# Patient Record
Sex: Male | Born: 1955 | Race: Black or African American | Hispanic: No | Marital: Single | Smoking: Never smoker
Health system: Southern US, Community
[De-identification: ages and names within clinical notes are randomized; demographics above are authoritative.]

## PROBLEM LIST (undated history)

## (undated) DIAGNOSIS — E785 Hyperlipidemia, unspecified: Secondary | ICD-10-CM

## (undated) DIAGNOSIS — E119 Type 2 diabetes mellitus without complications: Secondary | ICD-10-CM

## (undated) DIAGNOSIS — E7220 Disorder of urea cycle metabolism, unspecified: Secondary | ICD-10-CM

## (undated) DIAGNOSIS — D573 Sickle-cell trait: Secondary | ICD-10-CM

## (undated) DIAGNOSIS — G459 Transient cerebral ischemic attack, unspecified: Secondary | ICD-10-CM

## (undated) DIAGNOSIS — I10 Essential (primary) hypertension: Secondary | ICD-10-CM

## (undated) DIAGNOSIS — R569 Unspecified convulsions: Secondary | ICD-10-CM

## (undated) DIAGNOSIS — D709 Neutropenia, unspecified: Secondary | ICD-10-CM

## (undated) DIAGNOSIS — F209 Schizophrenia, unspecified: Secondary | ICD-10-CM

## (undated) DIAGNOSIS — E079 Disorder of thyroid, unspecified: Secondary | ICD-10-CM

## (undated) DIAGNOSIS — T56891A Toxic effect of other metals, accidental (unintentional), initial encounter: Secondary | ICD-10-CM

---

## 2007-03-31 ENCOUNTER — Emergency Department: Payer: Self-pay | Admitting: Emergency Medicine

## 2007-07-31 ENCOUNTER — Emergency Department: Payer: Self-pay | Admitting: Emergency Medicine

## 2007-07-31 ENCOUNTER — Other Ambulatory Visit: Payer: Self-pay

## 2008-02-05 ENCOUNTER — Emergency Department: Payer: Self-pay | Admitting: Emergency Medicine

## 2008-02-22 ENCOUNTER — Emergency Department: Payer: Self-pay | Admitting: Emergency Medicine

## 2008-04-15 ENCOUNTER — Inpatient Hospital Stay: Payer: Self-pay | Admitting: Internal Medicine

## 2008-04-16 ENCOUNTER — Other Ambulatory Visit: Payer: Self-pay

## 2008-05-20 ENCOUNTER — Inpatient Hospital Stay: Payer: Self-pay | Admitting: Internal Medicine

## 2008-05-29 ENCOUNTER — Emergency Department: Payer: Self-pay | Admitting: Emergency Medicine

## 2008-07-13 ENCOUNTER — Emergency Department: Payer: Self-pay | Admitting: Emergency Medicine

## 2008-09-09 ENCOUNTER — Inpatient Hospital Stay: Payer: Self-pay | Admitting: Psychiatry

## 2009-10-12 ENCOUNTER — Emergency Department: Payer: Self-pay | Admitting: Emergency Medicine

## 2009-11-27 ENCOUNTER — Emergency Department: Payer: Self-pay | Admitting: Internal Medicine

## 2009-12-30 ENCOUNTER — Emergency Department: Payer: Self-pay | Admitting: Emergency Medicine

## 2010-03-09 ENCOUNTER — Emergency Department: Payer: Self-pay | Admitting: Unknown Physician Specialty

## 2010-08-23 ENCOUNTER — Emergency Department: Payer: Self-pay | Admitting: Emergency Medicine

## 2011-06-10 ENCOUNTER — Emergency Department: Payer: Self-pay | Admitting: Emergency Medicine

## 2012-02-29 ENCOUNTER — Emergency Department: Payer: Self-pay | Admitting: Emergency Medicine

## 2012-02-29 LAB — COMPREHENSIVE METABOLIC PANEL
Anion Gap: 6 — ABNORMAL LOW (ref 7–16)
Calcium, Total: 8.3 mg/dL — ABNORMAL LOW (ref 8.5–10.1)
Chloride: 108 mmol/L — ABNORMAL HIGH (ref 98–107)
Co2: 28 mmol/L (ref 21–32)
EGFR (African American): >60
EGFR (Non-African Amer.): >60
Glucose: 112 mg/dL — ABNORMAL HIGH (ref 65–99)
Potassium: 3.8 mmol/L (ref 3.5–5.1)
SGOT(AST): 19 U/L (ref 15–37)
SGPT (ALT): 15 U/L (ref 12–78)
Sodium: 142 mmol/L (ref 136–145)
Total Protein: 7.2 g/dL (ref 6.4–8.2)

## 2012-02-29 LAB — URINALYSIS, COMPLETE
Bacteria: NONE SEEN
Bilirubin,UR: NEGATIVE
Glucose,UR: NEGATIVE mg/dL (ref 0–75)
Ketone: NEGATIVE
Leukocyte Esterase: NEGATIVE
Ph: 7 (ref 4.5–8.0)
RBC,UR: <1 /HPF (ref 0–5)
Specific Gravity: 1.003 (ref 1.003–1.030)
Squamous Epithelial: NONE SEEN
WBC UR: <1 /HPF (ref 0–5)

## 2012-02-29 LAB — SALICYLATE LEVEL: Salicylates, Serum: <1.7 mg/dL

## 2012-02-29 LAB — DRUG SCREEN, URINE
Barbiturates, Ur Screen: NEGATIVE (ref ?–200)
Cannabinoid 50 Ng, Ur ~~LOC~~: NEGATIVE (ref ?–50)
Cocaine Metabolite,Ur ~~LOC~~: NEGATIVE (ref ?–300)
Opiate, Ur Screen: NEGATIVE (ref ?–300)
Tricyclic, Ur Screen: NEGATIVE (ref ?–1000)

## 2012-02-29 LAB — CBC
HCT: 38.9 % — ABNORMAL LOW (ref 40.0–52.0)
HGB: 12.7 g/dL — ABNORMAL LOW (ref 13.0–18.0)
MCV: 81 fL (ref 80–100)
RBC: 4.82 10*6/uL (ref 4.40–5.90)
RDW: 14.1 % (ref 11.5–14.5)
WBC: 2.9 10*3/uL — ABNORMAL LOW (ref 3.8–10.6)

## 2012-02-29 LAB — ETHANOL: Ethanol %: <0.003 % (ref 0.000–0.080)

## 2012-09-11 ENCOUNTER — Emergency Department: Payer: Self-pay | Admitting: Emergency Medicine

## 2012-09-11 LAB — CBC
HCT: 40.5 % (ref 40.0–52.0)
HGB: 13.2 g/dL (ref 13.0–18.0)
MCH: 26.6 pg (ref 26.0–34.0)
MCHC: 32.6 g/dL (ref 32.0–36.0)
RBC: 4.97 10*6/uL (ref 4.40–5.90)

## 2012-09-11 LAB — COMPREHENSIVE METABOLIC PANEL
Albumin: 4.1 g/dL (ref 3.4–5.0)
Alkaline Phosphatase: 68 U/L (ref 50–136)
Anion Gap: 11 (ref 7–16)
BUN: 7 mg/dL (ref 7–18)
Calcium, Total: 8.6 mg/dL (ref 8.5–10.1)
Creatinine: 0.99 mg/dL (ref 0.60–1.30)
SGOT(AST): 21 U/L (ref 15–37)
SGPT (ALT): 15 U/L (ref 12–78)
Total Protein: 7.6 g/dL (ref 6.4–8.2)

## 2012-09-11 LAB — DRUG SCREEN, URINE
Barbiturates, Ur Screen: NEGATIVE (ref ?–200)
Benzodiazepine, Ur Scrn: NEGATIVE (ref ?–200)
Cannabinoid 50 Ng, Ur ~~LOC~~: NEGATIVE (ref ?–50)
Cocaine Metabolite,Ur ~~LOC~~: NEGATIVE (ref ?–300)
MDMA (Ecstasy)Ur Screen: NEGATIVE (ref ?–500)
Opiate, Ur Screen: NEGATIVE (ref ?–300)
Phencyclidine (PCP) Ur S: NEGATIVE (ref ?–25)

## 2012-09-11 LAB — URINALYSIS, COMPLETE
Bacteria: NONE SEEN
Blood: NEGATIVE
Leukocyte Esterase: NEGATIVE
Nitrite: NEGATIVE
Ph: 7 (ref 4.5–8.0)
RBC,UR: <1 /HPF (ref 0–5)
Squamous Epithelial: <1
WBC UR: <1 /HPF (ref 0–5)

## 2012-09-11 LAB — TSH: Thyroid Stimulating Horm: 1.82 u[IU]/mL

## 2012-09-11 LAB — ETHANOL
Ethanol %: <0.003 % (ref 0.000–0.080)
Ethanol: <3 mg/dL

## 2013-07-22 ENCOUNTER — Emergency Department: Payer: Self-pay | Admitting: Emergency Medicine

## 2013-07-22 LAB — COMPREHENSIVE METABOLIC PANEL
Albumin: 4.2 g/dL (ref 3.4–5.0)
Alkaline Phosphatase: 55 U/L
Anion Gap: 8 (ref 7–16)
BUN: 9 mg/dL (ref 7–18)
Bilirubin,Total: 0.6 mg/dL (ref 0.2–1.0)
CO2: 28 mmol/L (ref 21–32)
CREATININE: 1 mg/dL (ref 0.60–1.30)
Calcium, Total: 8.7 mg/dL (ref 8.5–10.1)
Chloride: 103 mmol/L (ref 98–107)
EGFR (Non-African Amer.): >60
Glucose: 123 mg/dL — ABNORMAL HIGH (ref 65–99)
Osmolality: 278 (ref 275–301)
Potassium: 3.1 mmol/L — ABNORMAL LOW (ref 3.5–5.1)
SGOT(AST): 18 U/L (ref 15–37)
SGPT (ALT): 14 U/L (ref 12–78)
Sodium: 139 mmol/L (ref 136–145)
Total Protein: 7.6 g/dL (ref 6.4–8.2)

## 2013-07-22 LAB — DRUG SCREEN, URINE

## 2013-07-22 LAB — CBC
HCT: 41.1 % (ref 40.0–52.0)
HGB: 13.5 g/dL (ref 13.0–18.0)
MCH: 26.3 pg (ref 26.0–34.0)
MCHC: 32.8 g/dL (ref 32.0–36.0)
MCV: 80 fL (ref 80–100)
PLATELETS: 212 10*3/uL (ref 150–440)
RBC: 5.13 10*6/uL (ref 4.40–5.90)
RDW: 13.5 % (ref 11.5–14.5)
WBC: 3.2 10*3/uL — ABNORMAL LOW (ref 3.8–10.6)

## 2013-07-22 LAB — ETHANOL
Ethanol %: <0.003 % (ref 0.000–0.080)
Ethanol: <3 mg/dL

## 2013-07-22 LAB — SALICYLATE LEVEL: Salicylates, Serum: <1.7 mg/dL

## 2013-07-22 LAB — ACETAMINOPHEN LEVEL: Acetaminophen: <2 ug/mL

## 2013-07-22 LAB — TSH: Thyroid Stimulating Horm: 7.61 u[IU]/mL — ABNORMAL HIGH

## 2014-03-22 ENCOUNTER — Emergency Department: Payer: Self-pay | Admitting: Student

## 2014-03-22 LAB — ETHANOL: Ethanol: <3 mg/dL

## 2014-03-22 LAB — COMPREHENSIVE METABOLIC PANEL
ALBUMIN: 4.1 g/dL (ref 3.4–5.0)
ALT: 13 U/L — AB
ANION GAP: 10 (ref 7–16)
Alkaline Phosphatase: 61 U/L
BUN: 15 mg/dL (ref 7–18)
Bilirubin,Total: 0.7 mg/dL (ref 0.2–1.0)
CALCIUM: 8.4 mg/dL — AB (ref 8.5–10.1)
CHLORIDE: 105 mmol/L (ref 98–107)
CO2: 26 mmol/L (ref 21–32)
Creatinine: 1.11 mg/dL (ref 0.60–1.30)
EGFR (African American): >60
Glucose: 133 mg/dL — ABNORMAL HIGH (ref 65–99)
Osmolality: 284 (ref 275–301)
Potassium: 3.5 mmol/L (ref 3.5–5.1)
SGOT(AST): 24 U/L (ref 15–37)
Sodium: 141 mmol/L (ref 136–145)
TOTAL PROTEIN: 8.1 g/dL (ref 6.4–8.2)

## 2014-03-22 LAB — CBC
HCT: 42.5 % (ref 40.0–52.0)
HGB: 13.4 g/dL (ref 13.0–18.0)
MCH: 26.2 pg (ref 26.0–34.0)
MCHC: 31.6 g/dL — ABNORMAL LOW (ref 32.0–36.0)
MCV: 83 fL (ref 80–100)
PLATELETS: 247 10*3/uL (ref 150–440)
RBC: 5.12 10*6/uL (ref 4.40–5.90)
RDW: 13.8 % (ref 11.5–14.5)
WBC: 4.1 10*3/uL (ref 3.8–10.6)

## 2014-03-22 LAB — SALICYLATE LEVEL: Salicylates, Serum: <1.7 mg/dL

## 2014-03-22 LAB — TSH: Thyroid Stimulating Horm: 3.37 u[IU]/mL

## 2014-03-22 LAB — ACETAMINOPHEN LEVEL

## 2014-03-23 LAB — DRUG SCREEN, URINE

## 2014-04-22 ENCOUNTER — Emergency Department: Payer: Self-pay | Admitting: Emergency Medicine

## 2014-04-22 LAB — COMPREHENSIVE METABOLIC PANEL
ALBUMIN: 3.6 g/dL (ref 3.4–5.0)
ALK PHOS: 61 U/L
ANION GAP: 2 — AB (ref 7–16)
AST: 14 U/L — AB (ref 15–37)
BILIRUBIN TOTAL: 0.4 mg/dL (ref 0.2–1.0)
BUN: 9 mg/dL (ref 7–18)
Calcium, Total: 8.1 mg/dL — ABNORMAL LOW (ref 8.5–10.1)
Chloride: 108 mmol/L — ABNORMAL HIGH (ref 98–107)
Co2: 31 mmol/L (ref 21–32)
Creatinine: 1.22 mg/dL (ref 0.60–1.30)
Glucose: 123 mg/dL — ABNORMAL HIGH (ref 65–99)
OSMOLALITY: 281 (ref 275–301)
Potassium: 3.3 mmol/L — ABNORMAL LOW (ref 3.5–5.1)
SGPT (ALT): 16 U/L
SODIUM: 141 mmol/L (ref 136–145)
Total Protein: 7.3 g/dL (ref 6.4–8.2)

## 2014-04-22 LAB — DRUG SCREEN, URINE

## 2014-04-22 LAB — CBC
HCT: 42 % (ref 40.0–52.0)
HGB: 13.5 g/dL (ref 13.0–18.0)
MCH: 26.2 pg (ref 26.0–34.0)
MCHC: 32.1 g/dL (ref 32.0–36.0)
MCV: 82 fL (ref 80–100)
Platelet: 246 10*3/uL (ref 150–440)
RBC: 5.14 10*6/uL (ref 4.40–5.90)
RDW: 13.5 % (ref 11.5–14.5)
WBC: 4.4 10*3/uL (ref 3.8–10.6)

## 2014-04-22 LAB — SALICYLATE LEVEL: Salicylates, Serum: <1.7 mg/dL

## 2014-04-22 LAB — ETHANOL

## 2014-04-22 LAB — TSH: Thyroid Stimulating Horm: 4.29 u[IU]/mL

## 2014-04-22 LAB — ACETAMINOPHEN LEVEL

## 2014-10-25 NOTE — Consult Note (Signed)
Brief Consult Note: Diagnosis: Paranoid schizophrenia.   Patient was seen by consultant.   Consult note dictated.   Recommend further assessment or treatment.   Orders entered.   Discussed with Attending MD.   Comments: [name] returns to the ER floridly psychotic. He has been refusing medications for the past month. He is calm and cooperative. He accepted medications with encouragment today. No longer he studies the Bible or writes notes. He is preoccupied with going back to school and believes that his ACT team took steps to register him for the fall semester. This is also a returning theme. There are no behavioral problems but treatment compliance is still a problem.    MSE: Alert, oriented to person and place. Friendly but terribly disorganized. Unable to hold a sensible conversation but able to communicate. No safety issues.   PLAN: 1.The patient has a history of poor behavior and poor treatment compliance when admitted here. He does well in the ER and on the medical floor.   2. I restarted all his medications as prescribed by Dr. Cherylann [name] in the community.  3. He usually get well before [facility] bed opens up for him. No referreal made.  4. He may return to his group home and ACTT team.   5. I will follow up.  Electronic Signatures: [name], [name] (MD)  (Signed 27-Aug-13 22:20)  Authored: Brief Consult Note   Last Updated: 27-Aug-13 22:20 by [name], [name] (MD)

## 2014-10-25 NOTE — Consult Note (Signed)
Brief Consult Note: Diagnosis: Paranoid schizophrenia.   Patient was seen by consultant.   Consult note dictated.   Recommend further assessment or treatment.   Orders entered.   Discussed with Attending MD.   Comments: [name] returns to the ER floridly psychotic. He has been refusing medications for the past month. He is calm and cooperative. He accepts medications better now.    He is disorganized, hyperreligious, intrusive, incomprehensible.   MSE: Alert, oriented to person and place. He inquires "why am I here" we tell him to take medications and shower. He has not shower since admission due to religious paranoid delusions. Her is still irritable.   PLAN: 1. The patient has a history of poor behavior and poor treatment compliance when admitted here. He does well in the ER and on the medical floor.   2. I restarted all his medications as prescribed by Dr. Cherylann RatelLateef in the community.  3. He usually get well before [facility] bed opens up for him but a referral was made.   4. He may return to his group home and ACTT team.   5. I will follow up.  Electronic Signatures: Kristine LineaPucilowska, Adrieana Fennelly (MD)  (Signed 03-Sep-13 19:57)  Authored: Brief Consult Note   Last Updated: 03-Sep-13 19:57 by Kristine LineaPucilowska, Alexa Golebiewski (MD)

## 2014-10-25 NOTE — Consult Note (Signed)
Brief Consult Note: Diagnosis: Paranoid schizophrenia.   Patient was seen by consultant.   Consult note dictated.   Recommend further assessment or treatment.   Orders entered.   Discussed with Attending MD.   Comments: [name] returns to the ER floridly psychotic. He has been refusing medications for the past month. He is calm and cooperative. He accepted medications with encouragment today. He is terribly disorganized and hyperreligious. He is mumbling, intrusive, incomprehensible.   MSE: Alert, oriented to person and place. Friendly but terribly disorganized. Unable to hold a sensible conversation but able to communicate. No safety issues.   PLAN: 1.The patient has a history of poor behavior and poor treatment compliance when admitted here. He does well in the ER and on the medical floor.   2. I restarted all his medications as prescribed by Dr. Cherylann RatelLateef in the community.  3. He usually get well before [facility] bed opens up for him but a referral was made.   4. He may return to his group home and ACTT team.   5. I will follow up.  Electronic Signatures: Kristine LineaPucilowska, Kolina Kube (MD)  (Signed 31-Aug-13 22:42)  Authored: Brief Consult Note   Last Updated: 31-Aug-13 22:42 by Kristine LineaPucilowska, Larance Ratledge (MD)

## 2014-10-25 NOTE — Consult Note (Signed)
Brief Consult Note: Diagnosis: Paranoid schizophrenia.   Patient was seen by consultant.   Consult note dictated.   Recommend further assessment or treatment.   Orders entered.   Discussed with Attending MD.   Comments: [name] returns to the ER floridly psychotic. He has been refusing medications for the past month at the group home. He is marginally cooperative in the ER. He did shower yesterday. He is still disorganized, hyperreligious, intrusive. He still refuses medications and labs.   MSE: Alert, oriented to person and place. He continues to study the Bible and write copious notes. His speech is easier to understand. he is asking permission [facility].   PLAN: 1. The patient has a history of poor behavior and poor treatment compliance when admitted here. He does well in the ER and on the medical floor. [facility].  2. I restarted all his medications as prescribed by Dr. Cherylann [name] in the community: Tegretol and Zyprexa. I change Zyprexa to Zyprexa Zydis. Will check Tegretol level and LFTs in am. We will check CBC as he presented with leukopenia. he gets Temazepam at night.  3. Medical. We continue ASA, Synthroid, Metformin and Zocor as prescribed by his PCP.   4. He may return to his group home and ACT team.   5. I will follow up.  Electronic Signatures: [name], [name] (MD)  (Signed 10-Sep-13 22:03)  Authored: Brief Consult Note   Last Updated: 10-Sep-13 22:03 by [name], [name] (MD)

## 2014-10-25 NOTE — Consult Note (Signed)
PATIENT NAMEKIYOTO, [name] [id number] OF BIRTH:  1955/07/30 OF CONSULTATION:  03/01/2012 PHYSICIAN:  Dr. Glennie Isle PHYSICIAN:  Dimarco Minkin B. Benedict Kue, MD FOR CONSULTATION: To evaluate a psychotic patient.  DATA: [name] is a 59 year old patient with a history of psychosis.  COMPLAINT: The patient unable to state.  OF PRESENT ILLNESS: [name] resides at assisted living facility. He is a patient of Dr. Cherylann Ratel and [facility] ACT team. He has been refusing all of his medications for the last month, became floridly psychotic and was brought to the ER by the police. The patient has a history of treatment noncompliance and has had similar episodes twice a year. He was here in a similar situation in December of 2012. In addition to psychotropic medications, he also refuses all other meds including diabetes. This is accompanied by decreased appetite and weight loss. Often his facility allows the situation to go on for several months leading to catatonia and mutism. The patient is not able to provide much information on admission. He is hyper religious, writing copious notes and, at times, ignoring the staff. He denies auditory hallucination but is too disorganized to participate in the interview in a meaningful way.  PSYCHIATRIC HISTORY: There is a long history of paranoid schizophrenia with multiple hospitalizations. He has been hospitalized at [facility] as well as [facility]. He usually does well on a combination of Zyprexa and mood stabilizer but there were problems with developing hyperammonemia on Depakote and lithium toxicity. The patient is a diabetic and when gets psychotic does not take his medications, moreover, he does not eat properly. He is originally from [place]. Has been diagnosed in his 42s; has been tried on numerous medications. There is no history of substance abuse.  PSYCHIATRIC HISTORY: None reported.  MEDICAL HISTORY:  1. Hypertension.   2. Diabetes. Hypothyroidism.  Dyslipidemia.  History of positive PPD. Sickle cell trait. History of acute renal failure on lithium. History of high ammonia on Depakote. Questionable seizure disorder.   ALLERGIES: Depakote.  ON ADMISSION:  1. Zyprexa 20 mg twice daily. 2. Aspirin 81 mg daily.  Cogentin 0.5 mg twice daily.  Klonopin 1 mg twice daily.  Synthroid 0.075 mg daily.  Metformin 1000 mg twice daily. Zocor 20 mg at bedtime. Trazodone 100 mg at bedtime.  HISTORY: He used to live with his mother in [place]. He visited some relatives in our area a couple of years ago and decided to stay here. His mother relocated briefly to our area but as far as we know she is back in [place]. She is very supportive and stays in close contact with the patient. He is a resident of assisted living facility. He is disabled. He has been working with [facility] ACT team.  OF SYSTEMS: Difficult to obtain. The patient is not cooperating.  EXAMINATION: VITAL SIGNS: Blood pressure 163/91, pulse 76, respirations 18, temperature 98.7. GENERAL: [name] is an African American male asleep in his room. The rest of the physical examination is deferred to his primary attending.  DIAGNOSTIC AND RADIOLOGICAL DATA: Chemistries are within normal limits except blood glucose 112. LFTs within normal limits, BAL zero, UDS negative for substances. CBC within normal limits except WBC 2.9. Urinalysis is not suggestive of UTI. Serum acetaminophen and salicylates are low.   STATUS EXAMINATION: The patient is asleep but arousable. He is pleasant polite and cooperative but terribly disorganized. He is oriented to person and place only. He knows he was brought to the  hospital by police. He denies suicidal or homicidal ideation. He is preocculied with religion. He knows it is Sunday and he should be in church. He denies hallucinations but appears to attend to internal stimuli. His insight and judgment are very poor.  RISK ASSESSMENT: This is a patient  with a history of psychosis and mood instability, treatment noncompliance, but no suicide attempt in his past.  I:  Chronic paranoid schizophrenia. II:  Deferred. III:  Hypertension,  Dyslipidemia, Hypothyroidism, Diabetes, Neutropenia, History of positive PPD. IV:  Mental illness, treatment compliance, primary support. V:  GAF 25.   We will restart [name] on all his medications as prescribed by Dr. Cherylann RatelLateef in the community as well as his primary doctor, Dr. Lacie ScottsNiemeyer.   At times the patient had to be transferred to [facility] for [facility] treatment but on numerous occasions we were able to discharge him from the Emergency Room.   He is not a good candidate [facility] as he tends to be treatment noncompliant while in a psychiatric hospital. He does very well in the Emergency Room or on the medical floor.   He is allowed to return to his facility.  5.  I will follow up.  Electronic Signatures: Kristine LineaPucilowska, Ryleigh Esqueda (MD)  (Signed on 27-Aug-13 01:42)  Authored  Last Updated: 27-Aug-13 01:42 by Kristine LineaPucilowska, Hazle Ogburn (MD)

## 2014-10-25 NOTE — Consult Note (Signed)
Brief Consult Note: Diagnosis: Paranoid schizophrenia.   Patient was seen by consultant.   Consult note dictated.   Recommend further assessment or treatment.   Orders entered.   Discussed with Attending MD.   Comments: [name] returns to the ER floridly psychotic. He has been refusing medications for the past month at the group home. He is marginally cooperative in the ER. He accepts medications more readily but still with encouragement. He is disorganized, hyperreligious, intrusive, incomprehensible.   MSE: Alert, oriented to person and place. He continues to study the Bible and write copious notes. He tied his "cape" in a differen fashion today, more like a roman toga rather than Superman style. He still has not shower claiming that the Bible forbids him to do so. Her is still irritable with me but no more name calling.   PLAN: 1. The patient has a history of poor behavior and poor treatment compliance when admitted here. He does well in the ER and on the medical floor.   2. I restarted all his medications as prescribed by Dr. Cherylann RatelLateef in the community.  3. He usually get well before [facility] bed opens up for him but a referral was made.   4. He may return to his group home and ACTT team.   5. I will follow up.  Electronic Signatures: Kristine LineaPucilowska, Chestina Komatsu (MD)  (Signed 04-Sep-13 22:16)  Authored: Brief Consult Note   Last Updated: 04-Sep-13 22:16 by Kristine LineaPucilowska, Raaga Maeder (MD)

## 2014-10-25 NOTE — Consult Note (Signed)
Brief Consult Note: Diagnosis: Paranoid schizophrenia.   Patient was seen by consultant.   Consult note dictated.   Recommend further assessment or treatment.   Orders entered.   Discussed with Attending MD.   Comments: [name] returns to the ER floridly psychotic. He has been refusing medications for the past month at the group home. He is marginally cooperative in the ER. He now acceptes medication but refuses to shower, change clothes or alow labs. He is somewhat more organized and verbal. No more catatonic repetitions.    MSE: Alert, oriented to person and place. He is again preoccupied with religion. H eis less intrusive and communicates better. There are no behavioral problems. No safety issues.  PLAN: 1. The patient has a history of poor behavior and poor treatment compliance when admitted here. He does well in the ER and on the medical floor. [facility].  2. I restarted all his medications as prescribed by Dr. Cherylann RatelLateef in the community: Tegretol and Zyprexa. I change Zyprexa to Zyprexa Zydis. We tried to check Tegretol level and LFTs but the patient refuses labs. Haldol was added along with Zyprexa as his recovery is excruciatingly slow.   3. Medical. We continue ASA, Synthroid, Metformin and Zocor as prescribed by his PCP.   4. He may return to his group home and ACT team.   5. I will follow up.  Electronic Signatures: Kristine LineaPucilowska, Meha Vidrine (MD)  (Signed 19-Sep-13 22:04)  Authored: Brief Consult Note   Last Updated: 19-Sep-13 22:04 by Kristine LineaPucilowska, Zylen Wenig (MD)

## 2014-10-25 NOTE — Consult Note (Signed)
Brief Consult Note: Diagnosis: Paranoid schizophrenia.   Patient was seen by consultant.   Consult note dictated.   Recommend further assessment or treatment.   Orders entered.   Discussed with Attending MD.   Comments: [name] returns to the ER floridly psychotic. He has been refusing medications for the past month at the group home. He is marginally cooperative in the ER. He accepts medications more readily but still with encouragement. He is disorganized, hyperreligious, intrusive, incomprehensible.   MSE: Alert, oriented to person and place. He continues to study the Bible and write copious notes. He tied his "cape" in a differen fashion today, more like a roman toga rather than Superman style. He still has not showered. He has been trying to call his mother in [place] on the phone that does not allow long distance coonection repeatedly in spite of our explaonations. He is incomprehensible. I worry that he has not been taking medications. He is still irritable with me but no more name calling.   PLAN: 1. The patient has a history of poor behavior and poor treatment compliance when admitted here. He does well in the ER and on the medical floor. [facility]  2. I restarted all his medications as prescribed by Dr. Cherylann RatelLateef in the community: Tegretol and Zyprexa. I change Zyprexa to Zyprexa Zydis. Will check Tegretol level and LFTs in am. We will check CBC as he presented with leukopenia. he gets Temazepam at night.  3. Medical. We continue ASA, Synthroid, Metformin and Zocor as prescribed by his PCP.   4. He may return to his group home and ACT team.   5. I will follow up.  Electronic Signatures: Kristine LineaPucilowska, Rebeka Kimble (MD)  (Signed 05-Sep-13 19:11)  Authored: Brief Consult Note   Last Updated: 05-Sep-13 19:11 by Kristine LineaPucilowska, Loany Neuroth (MD)

## 2014-10-25 NOTE — Consult Note (Signed)
Brief Consult Note: Diagnosis: Paranoid schizophrenia.   Patient was seen by consultant.   Consult note dictated.   Recommend further assessment or treatment.   Orders entered.   Discussed with Attending MD.   Comments: [name] returns to the ER floridly psychotic. He has been refusing medications for the past month at the group home. He is marginally cooperative in the ER. He did shower yesterday. He is still disorganized, hyperreligious, intrusive. He still refuses medications and labs.   MSE: Alert, oriented to person and place. He continues to study the Bible and write copious notes. His speech is easier to understand. he is asking permission to [facility].   PLAN: 1. The patient has a history of poor behavior and poor treatment compliance when admitted here. He does well in the ER and on the medical floor. [facility].  2. I restarted all his medications as prescribed by Dr. Cherylann RatelLateef in the community: Tegretol and Zyprexa. I change Zyprexa to Zyprexa Zydis. Will check Tegretol level and LFTs in am. We will check CBC as he presented with leukopenia. he gets Temazepam at night.  3. Medical. We continue ASA, Synthroid, Metformin and Zocor as prescribed by his PCP.   4. He may return to his group home and ACT team.   5. I will follow up.  Electronic Signatures: Kristine LineaPucilowska, Maame Dack (MD)  (Signed 11-Sep-13 22:58)  Authored: Brief Consult Note   Last Updated: 11-Sep-13 22:58 by Kristine LineaPucilowska, Anders Hohmann (MD)

## 2014-10-25 NOTE — Consult Note (Signed)
Brief Consult Note: Diagnosis: Paranoid schizophrenia.   Patient was seen by consultant.   Consult note dictated.   Recommend further assessment or treatment.   Orders entered.   Discussed with Attending MD.   Comments: [name] returns to the ER floridly psychotic. He has been refusing medications for the past month. He is calm and cooperative. He accepted medications with encouragment.   MSE: Alert, oriented to person and place. recognizes me from past admissions. Friendly, Talks about police bringing him to the hospital. He is uncertain why. He knows it is Sunday and feels he needs to be in church. He is reading the Bible and started writing his usual "notes"    PLAN: 1.The patient has ahistory of poor behavior and poor treatment compliance when admitted here. He does well in the ER and on the medical floor.   2. I restarted all his medications as prescribed by Dr. Cherylann [name] in the community.  3. He usually get well before [facility] bed opens up for him. No referreal made.  4. He may return to his group home and ACTT team.   5. I will follow up.  Electronic Signatures: [name], [name] (MD)  (Signed 25-Aug-13 19:23)  Authored: Brief Consult Note   Last Updated: 25-Aug-13 19:23 by [name], [name] (MD)

## 2014-10-25 NOTE — Consult Note (Signed)
Brief Consult Note: Diagnosis: Paranoid schizophrenia.   Patient was seen by consultant.   Consult note dictated.   Recommend further assessment or treatment.   Orders entered.   Discussed with Attending MD.   Comments: Mr. Cliffton AstersWhite returns to the ER floridly psychotic. He has been refusing medications for the past month. He is calm and cooperative. He accepted medications with encouragment today.   He is disorganized, hyperreligious and intrusive, incomprehensible.   MSE: Alert, oriented to person and place. Iirritable, unable to hold a sensible conversation. Called me a "b....tch". No safety issues.   PLAN: 1.The patient has a history of poor behavior and poor treatment compliance when admitted here. He does well in the ER and on the medical floor.   2. I restarted all his medications as prescribed by Dr. Cherylann RatelLateef in the community.  3. He usually get well before [facility] bed opens up for him but a referral was made.   4. He may return to his group home and ACTT team.   5. I will follow up.  Electronic Signatures: Kristine LineaPucilowska, Jolanta (MD)  (Signed 03-Sep-13 19:53)  Authored: Brief Consult Note   Last Updated: 03-Sep-13 19:53 by Kristine LineaPucilowska, Jolanta (MD)

## 2014-10-25 NOTE — Consult Note (Signed)
Brief Consult Note: Diagnosis: Paranoid schizophrenia.   Patient was seen by consultant.   Consult note dictated.   Recommend further assessment or treatment.   Orders entered.   Discussed with Attending MD.   Comments: [name] returns to the ER floridly psychotic. He has been refusing medications for the past month. He is calm and cooperative. He accepted medications with encouragment today. He is terribly disorganized and hyperreligious. He is mumbling, intrusive, incomprehensible.   MSE: Alert, oriented to person and place. Friendly but terribly disorganized. Unable to hold a sensible conversation but able to communicate. No safety issues.   PLAN: 1.The patient has a history of poor behavior and poor treatment compliance when admitted here. He does well in the ER and on the medical floor.   2. I restarted all his medications as prescribed by Dr. Cherylann RatelLateef in the community.  3. He usually get well before [facility] bed opens up for him but a referral was made.   4. He may return to his group home and ACTT team.   5. I will follow up.  Electronic Signatures: Kristine LineaPucilowska, Jaliza Seifried (MD)  (Signed 01-Sep-13 20:22)  Authored: Brief Consult Note   Last Updated: 01-Sep-13 20:22 by Kristine LineaPucilowska, Hatley Henegar (MD)

## 2014-10-25 NOTE — Consult Note (Signed)
Brief Consult Note: Diagnosis: Paranoid schizophrenia.   Patient was seen by consultant.   Consult note dictated.   Recommend further assessment or treatment.   Orders entered.   Discussed with Attending MD.   Comments: Mr. Cliffton AstersWhite returns to the ER floridly psychotic. He has been refusing medications for the past month. He is calm and cooperative. He accepted medications with encouragment today. he is preoccupied with the Bible. he produces copious writtings. .   MSE: Alert, oriented to person and place. Friendly but terribly disorganized unable to answer sensibly any quastions. preoccupied with :three books". No behavioral problems. No safety issues.   PLAN: 1.The patient has ahistory of poor behavior and poor treatment compliance when admitted here. He does well in the ER and on the medical floor.   2. I restarted all his medications as prescribed by Dr. Cherylann RatelLateef in the community.  3. He usually get well before [facility] bed opens up for him. No referreal made.  4. He may return to his group home and ACTT team.   5. I will follow up.  Electronic Signatures: Kristine LineaPucilowska, Jamyson Jirak (MD)  (Signed 26-Aug-13 23:58)  Authored: Brief Consult Note   Last Updated: 26-Aug-13 23:58 by Kristine LineaPucilowska, Torres Hardenbrook (MD)

## 2014-10-25 NOTE — Consult Note (Signed)
Brief Consult Note: Diagnosis: Paranoid schizophrenia.   Patient was seen by consultant.   Recommend to proceed with surgery or procedure.   Recommend further assessment or treatment.   Discussed with Attending MD.   Comments: Mr. Cliffton AstersWhite returns to the ER floridly psychotic. He has been refusing medications for the past month at the group home. He is marginally cooperative in the ER. He did shower yesterday. He is more organized, less intrusive. He no longer refuses medications but does not shower.    MSE: Alert, oriented to person and place. He is no longer hyperreligius. His speech is easier to understand. He discusses latest football games.    PLAN: 1. The patient has a history of poor behavior and poor treatment compliance when admitted here. He does well in the ER and on the medical floor. [facility]  2. I restarted all his medications as prescribed by Dr. Cherylann RatelLateef in the community: Tegretol and Zyprexa. I change Zyprexa to Zyprexa Zydis. Will check Tegretol level and LFTs in am. We will check CBC as he presented with leukopenia. he gets Temazepam at night.  3. Medical. We continue ASA, Synthroid, Metformin and Zocor as prescribed by his PCP.   4. He may return to his group home and ACT team.   5. I will follow up.  Electronic Signatures: Kristine LineaPucilowska, Tesa Meadors (MD)  (Signed 13-Sep-13 21:29)  Authored: Brief Consult Note   Last Updated: 13-Sep-13 21:29 by Kristine LineaPucilowska, Lashunta Frieden (MD)

## 2014-10-25 NOTE — Consult Note (Signed)
Brief Consult Note: Diagnosis: Paranoid schizophrenia.   Patient was seen by consultant.   Consult note dictated.   Recommend further assessment or treatment.   Orders entered.   Discussed with Attending MD.   Comments: [name] returns to the ER floridly psychotic. He has been refusing medications for the past month at the group home. He is marginally cooperative in the ER. He did shower yesterday. He is more organized, less intrusive. He no longer refuses medications but does not shower.    MSE: Alert, oriented to person and place. He is again preoccupied with religion. He is too talkative and intrusive at times, then mute and witdrawn. No behavioral problems. He accepts medications most of the time but refuses labwork. This is unfortunate because he should be tried on Clozaril at this point.   PLAN: 1. The patient has a history of poor behavior and poor treatment compliance when admitted here. He does well in the ER and on the medical floor. [facility]  2. I restarted all his medications as prescribed by Dr. Cherylann RatelLateef in the community: Tegretol and Zyprexa. I change Zyprexa to Zyprexa Zydis. We tried to check Tegretol level and LFTs but the patient refuses labs.   3. Medical. We continue ASA, Synthroid, Metformin and Zocor as prescribed by his PCP.   4. He may return to his group home and ACT team.   5. I will follow up.  Electronic Signatures: [name], [name] (MD)  (Signed 17-Sep-13 00:13)  Authored: Brief Consult Note   Last Updated: 17-Sep-13 00:13 by [name], [name] (MD)

## 2014-10-25 NOTE — Consult Note (Signed)
Brief Consult Note: Diagnosis: Paranoid schizophrenia.   Patient was seen by consultant.   Consult note dictated.   Recommend further assessment or treatment.   Orders entered.   Discussed with Attending MD.   Comments: [name] returns to the ER floridly psychotic. He has been refusing medications for the past month. He is calm and cooperative. He accepted medications with encouragment today. He is terribly disorganized and hyperreligious. Wants to go to church on Sunday and tells me about little kids falling asleep at church. Still reads the Bible. Today he is sleepy and spends whole morning in bed. He takes medications with a great deal od emcouragement.    MSE: Alert, oriented to person and place. Friendly but terribly disorganized. Unable to hold a sensible conversation but able to communicate. No safety issues.   PLAN: 1.The patient has a history of poor behavior and poor treatment compliance when admitted here. He does well in the ER and on the medical floor.   2. I restarted all his medications as prescribed by Dr. Cherylann [name] in the community.  3. He usually get well before [facility] bed opens up for him but a referral was made.   4. He may return to his group home and ACTT team.   5. I will follow up.  Electronic Signatures: [name], [name] (MD)  (Signed [id number] [id number]-Aug-13 23:16)  Authored: Brief Consult Note   Last Updated: 30-Aug-13 23:16 by [name], [name] (MD)

## 2014-10-25 NOTE — Consult Note (Signed)
Brief Consult Note: Diagnosis: Paranoid schizophrenia.   Patient was seen by consultant.   Consult note dictated.   Recommend further assessment or treatment.   Orders entered.   Discussed with Attending MD.   Comments: [name] returns to the ER floridly psychotic. He has been refusing medications for the past month at the group home. He is marginally cooperative in the ER. He accepts medications more readily but still with encouragement. He did shower in the morning but put his dirty clothes back on. He has layers and layers of shirts plus a cape. He is still disorganized, hyperreligious, intrusive. He refused labs this am.  MSE: Alert, oriented to person and place. He continues to study the Bible and write copious notes. His speech is easier to understand. he is asking permission to go to [employer] for a religious event. He also complains that "two tablets" that he brought ot the hospital along with his Bible are missing. He is less irritable with me.   PLAN: 1. The patient has a history of poor behavior and poor treatment compliance when admitted here. He does well in the ER and on the medical floor. [facility]  2. I restarted all his medications as prescribed by Dr. Cherylann RatelLateef in the community: Tegretol and Zyprexa. I change Zyprexa to Zyprexa Zydis. Will check Tegretol level and LFTs in am. We will check CBC as he presented with leukopenia. he gets Temazepam at night.  3. Medical. We continue ASA, Synthroid, Metformin and Zocor as prescribed by his PCP.   4. He may return to his group home and ACT team.   5. I will follow up.  Electronic Signatures: Kristine LineaPucilowska, Yvonnia Tango (MD)  (Signed 10-Sep-13 06:50)  Authored: Brief Consult Note   Last Updated: 10-Sep-13 06:50 by Kristine LineaPucilowska, Willetta York (MD)
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
## 2014-10-28 NOTE — Consult Note (Signed)
Brief Consult Note: Diagnosis: Paranoid schizophrenia.   Patient was seen by consultant.   Consult note dictated.   Recommend further assessment or treatment.   Orders entered.   Discussed with Attending MD.   Comments: [name] seen in the ED. He has started improving and able to respond to most of the questions. Reviewed his chart. Pt has long h/o non compliance of medications and he was on Prolixin Dec in the [facility]. He stopped taking the Decanoate and was on oral medications. Pt has been followed by [facility]- ACTT team.  Case dicussed with staff. Will restart him on Prolixin Decaonate 25mg  q2 weeks. Might consider Outpt Committment for compliance.  He will follow up with [facility]- ACTT team.  Plan; Continue Prolixin 5mg  po TID Continue Olanzapine 10mg  po qam and 20mg  po qhs Increase Cogentin 1mg  po bid.  Start Prolixin Dec 25mg  IM q2 weeks.  Will be d/c on out pt comittment.  Electronic Signatures: Rhunette CroftFaheem, Uzma S (MD)  (Signed 13-Mar-14 10:19)  Authored: Brief Consult Note   Last Updated: 13-Mar-14 10:19 by Rhunette CroftFaheem, Uzma S (MD)
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
## 2014-10-29 NOTE — Consult Note (Signed)
Psychiatry: PAtient continues to be psychotic and confused but less irritatble than previously. Patient is delusional and unable to show insight into his illness. Lacks capacity to decide about mental illness. Under the circumstances I think it is reasonable to try to give patient medication in food as without it he has no real chance of improvement. Still refuses a long-acting shot. Given repeated hospital stays and non-complience we still await [facility].  Electronic Signatures: Audery Amellapacs, Nochum Fenter T (MD)  (Signed on 27-Oct-15 23:47)  Authored  Last Updated: 27-Oct-15 23:47 by Audery Amellapacs, Aprel Egelhoff T (MD)
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
## 2014-10-29 NOTE — Consult Note (Signed)
Psychiatry: Follow-up for this gentleman with schizophrenia and a history of repeated noncompliance.  The patient has no new complaints today.  In conversation he is entirely focused on his hyper religious statements.  He has not shown any acute aggressive behavior or dangerous behavior.  Pretty much stays to himself writing long and incoherent religious tracks on pieces of paper with the crayons that are provided.  Confronted with medication he typically refuses to take it although there have been some cases in the last day or so where spontaneously he will suddenly insist on taking it for no clear reason.  He can never give any explanation for his behavior acutely. review of systems he denies hallucinations denies suicidal or homicidal ideation denies complaints about his mood.  Has no physical complaints. mental status this is a disheveled gentleman not terribly well groomed but at least he is bathing enough to keep himself from smelling bad.  Eye contact only intermittent.  Usually will look up from his writing to talk.  Speech is minimal in amount and usually entirely focused on religious statements.  As an example, I spoke to him for several minutes and asking several open-ended questions.  He did not make any response at all.  A few minutes later he came out of his room and clearly said to me "God will take care of it" before returning to his room.  He does not however appear to be hostile or threatening and has not been violent in his behavior.  Will not cooperate with cognitive testing. remained psychotic.  Very poor insight and judgment.  Unable to take care of himself outside the hospital.  Prone to getting into arguments when confronted about his medication compliance.  Most of the time he stays withdrawn but he has a tendency to occasionally lose his temper especially in group home environments at which time he can appear to be threatening.  Affect team has failed to be able to keep him involved in  medication compliance or improve his insight.  The plan at this point is [facility].  Patient is aware of it and makes no comment one way or the other.  He is on the waiting list.  Plan for now is to continue medication as ordered.  Daily 3 observation and counseling. Schizophrenia undifferentiated  Electronic Signatures: Clapacs, Jackquline DenmarkJohn T (MD)  (Signed on 21-Oct-15 12:35)  Authored  Last Updated: 21-Oct-15 12:35 by Audery Amellapacs, John T (MD)

## 2014-10-29 NOTE — Consult Note (Signed)
PATIENT NAME:  [name], [name] MR#:  [id number] DATE OF BIRTH:  Jan 31, 1956  DATE OF CONSULTATION:  03/22/2014  REFERRING PHYSICIAN:   CONSULTING PHYSICIAN:  Audery AmelJohn T. Clapacs, MD  IDENTIFYING INFORMATION AND REASON FOR CONSULTATION: A 59 year old man brought in under involuntary commitment. History of schizophrenia. Consultation for treatment and disposition.   HISTORY OF PRESENT ILLNESS: Information obtained from the chart and to a lesser degree from the patient. The commitment paperwork from the group home states that he has been refusing medicine for the last week and a half. He is sleeping in the closet, acting bizarre, getting hyperreligious. The patient himself is not providing useful history. He will not tell me anything about what brings him into the hospital. Refuses to acknowledge that he is even in a hospital. Refuses to acknowledge that he has a history of mental health problems or has ever been prescribed any medication.   PAST PSYCHIATRIC HISTORY: Long history of schizophrenia. Multiple hospitalizations. History of aggression in the past when psychotic. He is chronically noncompliant. This has been a frequent problem and becomes very psychotic, hyperreligious, and agitated when he is off his medicine. His behavior and noncompliance are such that he has been beyond the ability of our inpatient unit to manage safely. He [facility]. When he takes adequate doses of antipsychotics and mood stabilizers, he improves and becomes more functional and able to live outside the hospital.   PAST MEDICAL HISTORY: The patient has diabetes managed with oral medication, hypothyroidism, high blood pressure.   SOCIAL HISTORY: Apparently, he has been residing in his current group home a couple of weeks. I am not sure if he was at the hospital just before that or had been at another facility. In the past, he had ACT team services. Not known if he has a guardian at this point.    SUBSTANCE ABUSE HISTORY: No past history identified. He does not discuss it.   FAMILY HISTORY: None identified.   CURRENT MEDICATIONS: The list provided is not clear at this time.   MEDICATIONS: Based on what he has taken in the past are most likely: Tegretol 200 mg twice a day, Zyprexa 10 mg twice a day, Cogentin 0.5 mg twice a day, Klonopin 0.5 mg twice a day, aspirin 81 mg a day, levothyroxine 75 mcg a day, metformin 1000 mg twice a day, simvastatin 20 mg per day, trazodone 100 mg at night.   ALLERGIES: DEPAKOTE.   REVIEW OF SYSTEMS: The patient is not answering any direct questions. He argues and claims that he is not in a hospital and that he is actually the doctor. Becomes are rambling after that. Does not appear to be in obvious physical distress.   MENTAL STATUS EXAMINATION: Disheveled, chronically ill gentleman interviewed in the Emergency Room. He is only passively cooperative and at times oppositional. Eye contact intermittent. Psychomotor activity limited. Speech is decreased in total amount, somewhat slurred because of his very poor dentition. Affect constricted. Mood stated as fine. Thoughts are disorganized, bizarre, dominated by delusions. Will not answer direct questions about suicidal or homicidal ideation. Will not answer direct questions about hallucinations. Judgment and insight poor. Cognitive testing cannot be done because of his psychosis.   LABORATORY RESULTS: CBC normal. Salicylates normal. Thyroid-stimulating hormone 3.3 which is normal. Chemistry panel unremarkable. Acetaminophen negative. No urine back yet.   VITAL SIGNS: Most recent blood pressure 189/107, pulse 111, respirations 18.   ASSESSMENT: A 59 year old man with schizophrenia, once again  noncompliant with his medicines, extremely psychotic, unmanageable at the group home. History would suggest he will be unmanageable on our psychiatric unit.   TREATMENT PLAN: Restart all of his medicines. Try and form  some rapport gradually and get some more history. We will initiate referral to [facility] at this point. Monitor vital signs. He may need blood pressure medicine added.   DIAGNOSIS, PRINCIPAL AND PRIMARY:  AXIS I: Schizophrenia.   SECONDARY DIAGNOSES: AXIS I: No further.  AXIS II: No diagnosis.  AXIS III: Hypothyroid, diabetes, hypertension.    ____________________________ Audery Amel, MD jtc:at D: 03/22/2014 17:28:18 ET T: 03/22/2014 17:48:00 ET JOB#: [id number]  cc: Audery Amel, MD, <Dictator> Audery Amel MD ELECTRONICALLY SIGNED 03/31/2014 0:12
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
## 2014-10-29 NOTE — Consult Note (Signed)
PATIENT NAME:  [name], [name] MR#:  [id number] DATE OF BIRTH:  1956-06-03  DATE OF CONSULTATION:  04/22/2014  REFERRING PHYSICIAN:   CONSULTING PHYSICIAN:  Ganon Demasi K. Guss Bundehalla, MD  PLACE OF DICTATION: [facility] Emergency Room #[facility], [place], [place].   AGE: 59 years.   SEX: Male.   RACE: African American.   SUBJECTIVE: The patient was seen in [facility] in [facility]. The patient is a 59 year old African-American male with a long history of schizophrenia, schizoaffective disorder with psychosis. He has been living at a group home and being followed by ACT team through [facility] According to information obtained, the patient has been noncompliant with his medication, not taking his medications as prescribed or recommended. He became very agitated and very combative and was brought to [facility]. Currently, he was given Geodon and Ativan IM, and he is sleeping. The patient shows aggressive behavior towards himself and people around, and behavior tends to be dangerous to self or others as he is very psychotic and bizarre. He is recommended for admission and [facility].   IMPRESSION: Schizoaffective disorder with psychosis.   RECOMMEND: Continue involuntary commitment and await [facility] for further help.     ____________________________ Jannet MantisSurya K. Guss Bundehalla, MD skc:at D: 04/22/2014 15:12:45 ET T: 04/22/2014 15:18:34 ET JOB#: [id number]  cc: Monika SalkSurya K. Guss Bundehalla, MD, <Dictator> Beau FannySURYA K Siniya Lichty MD ELECTRONICALLY SIGNED 04/23/2014 12:04
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
## 2014-10-29 NOTE — Consult Note (Signed)
Psychiatry: Follow-up for this gentleman with schizophrenia.  He has no new complaints.  Continues to stay mostly withdrawn to his room.  When he does interact with staff it is fairly pleasant.  No signs of threatening or hostile behavior.  Patient is only intermittently cooperative with medicine.  He denies suicidal or homicidal ideation.  Denies hallucinations but appears to be responding to internal stimuli.  Disorganized paranoid hyper religious thought. change to diagnosis or treatment plan.  [facility] to [facility].  Electronic Signatures: Kimberlly Norgard, Jackquline DenmarkJohn T (MD)  (Signed on 23-Oct-15 19:35)  Authored  Last Updated: 23-Oct-15 19:35 by Audery Amellapacs, Garnett Rekowski T (MD)

## 2014-10-29 NOTE — Consult Note (Signed)
Psychiatry: Follow-up for this 59 year old man with schizophrenia.  He has been compliant with medication.  He has no real new complaints today.  He is requesting that we discharge him so that [place] can go to [place] to play [place] basketball.  Patient speech continues to be disorganized and hyper religious with some grandiosity.  At least however he is speaking to maintain and holding a appropriate conversation.  Still shows no insight.  Still talks about hearing voices.  Mood and affect appear to be pleasant however.  No aggressive behavior no threatening behavior.  is schizophrenia.  We have [facility] but if his compliance continues to be adequate we can investigate possible return home as well.  Past history continues to make him inappropriate for admission downstairs.  Supportive and educational counseling done.  No changed plan.  Electronic Signatures: Clapacs, Jackquline DenmarkJohn T (MD)  (Signed on 16-Sep-15 17:01)  Authored  Last Updated: 16-Sep-15 17:01 by Audery Amellapacs, John T (MD)

## 2014-10-29 NOTE — Consult Note (Signed)
Psychiatry: Follow-up for this 59 year old man with schizophrenia.  For the most part he has been calm and cooperative.  He continues to be psychotic and hyper religious.  Affect and mood are labile.  No violence or threats however.  With a little bit of coaching he can usually take his medication.  Patient has a long history of medicine noncompliance.  Currently he is improving and is nearing his baseline. current medicine including Zyprexa.  Supportive counseling done.  We will try to recontact his group home and see if he can possibly go back there otherwise [facility] Schizophrenia  Electronic Signatures: Josilynn Losh, Jackquline DenmarkJohn T (MD)  (Signed on 17-Sep-15 18:53)  Authored  Last Updated: 17-Sep-15 18:53 by Audery Amellapacs, Braniya Farrugia T (MD)

## 2014-10-29 NOTE — Consult Note (Signed)
PATIENT NAME:  [name], Gregory MR#:  [id number] DATE OF BIRTH:  Nov 19, 1955  DATE OF CONSULTATION:  04/25/2014  REFERRING PHYSICIAN:   CONSULTING PHYSICIAN:  Gregory AmelJohn T. Clapacs, MD  IDENTIFYING INFORMATION AND REASON FOR CONSULT: This is a [age] year old male with a long history of schizophrenia, who was brought from his group home with reports that he has been aggressive, threatening, refusing medication, acting bizarre, poor self-care.   HISTORY OF PRESENT ILLNESS: Information obtained from the patient and the chart. Commitment paperwork reports that he has been acting bizarre, has been hyperreligious, delusion, disorganized in his thinking and behavior. Refuses medication. Refuses to obey rules around the house. Gets hostile with staff. ACT team has been working with him to try to establish compliance with medication and improved functioning without any success. They are concerned that he continues to worsen in his functioning. On interview today, the patient as usual does not really even acknowledge that he has any mental health problems. He acknowledges that something called the ACT Team exists, but he is vague about what they do for him or what their goals are. He is completely evasive when discussing whether or not he takes medication. He is so hyperreligious and disorganized in his thinking right now that it is impossible to get much other useful history out of him. He does not make any hostile or threatening statements.   PAST PSYCHIATRIC HISTORY: Long history of schizophrenia, multiple prior hospitalizations, frequently compliant with medicine. He has [facility] ACT Team working with him. They have been unable to keep him on his medication regularly, as well. When he decompensates, he becomes hyperreligious and bizarre, at times becomes very labile in his mood, at times becomes hostile. No clear history of suicide attempts.   The patient was most recently at our hospital in September and was here for a fairly  extended stay before finally settled down and took medication enough that we were able to send him back out to his group home. Subsequently refusing medicine. Will not take IM injections regularly.   SUBSTANCE ABUSE HISTORY: It is hard to get much information from him, but in the past, he has typically denied having any history of alcohol or drug abuse problems.   SOCIAL HISTORY: The patient does not seem to have regular contact with his family. He has been in his current group home only a short period of time. He has had several other group home placements in the past. He seems to have little positive social contact.   PAST MEDICAL HISTORY: He has hypothyroidism and diabetes, for both of which he is typically medication noncompliant. Also has high blood pressure.   FAMILY HISTORY: He cannot provide any information about that.   CURRENT MEDICATIONS: Lisinopril 10 mg once a day, trazodone 100 mg at night, simvastatin 20 mg at night, olanzapine 15 mg twice a day, metformin 1000 mg twice a day, Synthroid 75 mcg once a day, Klonopin 0.5 mg twice a day, Cogentin 0.5 mg twice a day, aspirin 81 mg per day.   ALLERGIES: DEPAKOTE.   REVIEW OF SYSTEMS: The patient, as noted above, is a poor historian and it is difficult to get a meaningful review of systems. He does say that he has been feeling like he has been having a cold recently, but he cannot give any detail about it. Not indicating any specific pain. Not indicating specific pulmonary, GI or cardiac complaints. He is nonspecific or evasive about psychotic symptoms. He denies suicidal or homicidal ideation.  MENTAL STATUS EXAMINATION: Disheveled gentleman who looks his stated age or older. He is passively cooperative with the interview. Eye contact is okay. Psychomotor activity is a little diminished. Speech is rambling, but normal in tone. Affect is a little constricted and odd; not hostile, however. Mood is stated as all right. Thoughts are very  disorganized. He answers almost every question with a religious answer, usually one that has no obvious connection to the question. He denies suicidal or homicidal ideation. Denies hallucinations. He is oriented to the fact that he is in a hospital. He knows the year and month. Normal fund of knowledge.   LABORATORY RESULTS: The chemistry panel shows an elevated glucose of 123, potassium low at 3.3, chloride slightly elevated at 108, calcium low 8.1, AST low at 14. TSH is normal at 4.29. Drug screen is all negative. CBC is unremarkable.   VITAL SIGNS: Blood pressure in the Emergency Room is currently 201/113, respirations 20, pulse 91, temperature 98.4.   ASSESSMENT: A 59 year old man with schizophrenia, who has been noncompliant with medication, psychotic, hyperreligious, bizarre in his behavior. History of threatening hostile behavior, apparently doing that again at his group home. Unmanageable back at the group home, even with ACT Team support.   TREATMENT PLAN: We have initiated A referral to [facility] as we did last month. Meanwhile, try to continue his oral medicine as prescribed to see if we can get him stabilized enough to reconsider going home if we cannot get him into regional, but this is the same situation we had last time and once again it has fallen apart at home, suggesting that maybe going to a longer term hospital stay would be better at this point.   TREATMENT PLAN: Continue current medicine. I am increasing the dose of his antihypertensive. I tried to do some brief supportive therapy with the patient. Follow him daily.   DIAGNOSIS, PRINCIPAL AND PRIMARY:  AXIS I: Schizophrenia, undifferentiated.   SECONDARY DIAGNOSES:  AXIS I: No further.  AXIS II: Deferred.  AXIS III: High blood pressure, diabetes, hypothyroidism.   ____________________________ Gregory Amel, MD jtc:MT D: 04/25/2014 13:09:01 ET T: 04/25/2014 13:29:11 ET JOB#: [id number]  cc: Gregory Amel, MD, <Dictator> Gregory Amel MD ELECTRONICALLY SIGNED 04/27/2014 16:16

## 2014-10-29 NOTE — Consult Note (Signed)
Psychiatry: PAtient seen today. He appears to be more rational and much calmer although conversation is still hard. He has been taking some of his medication and shown some improvemet. Affect calmer. Thoughts still disorganized. Poor grooming and self care.history continues to support the case [facility].  Electronic Signatures: Nadeen Shipman, Jackquline DenmarkJohn T (MD)  (Signed on 28-Oct-15 22:33)  Authored  Last Updated: 28-Oct-15 22:33 by Audery Amellapacs, Jentzen Minasyan T (MD)
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
## 2015-09-15 ENCOUNTER — Encounter: Payer: Self-pay | Admitting: Urgent Care

## 2015-09-15 ENCOUNTER — Emergency Department
Admission: EM | Admit: 2015-09-15 | Discharge: 2015-09-24 | Disposition: A | Discharge status: Other Acute Inpatient Hospital | Payer: Medicare Other | Attending: Emergency Medicine | Admitting: Emergency Medicine

## 2015-09-15 DIAGNOSIS — F1310 Sedative, hypnotic or anxiolytic abuse, uncomplicated: Secondary | ICD-10-CM | POA: Diagnosis not present

## 2015-09-15 DIAGNOSIS — R7303 Prediabetes: Secondary | ICD-10-CM

## 2015-09-15 DIAGNOSIS — F2089 Other schizophrenia: Secondary | ICD-10-CM | POA: Insufficient documentation

## 2015-09-15 DIAGNOSIS — Z7984 Long term (current) use of oral hypoglycemic drugs: Secondary | ICD-10-CM | POA: Diagnosis not present

## 2015-09-15 DIAGNOSIS — F209 Schizophrenia, unspecified: Secondary | ICD-10-CM | POA: Diagnosis not present

## 2015-09-15 DIAGNOSIS — R258 Other abnormal involuntary movements: Secondary | ICD-10-CM | POA: Diagnosis not present

## 2015-09-15 DIAGNOSIS — E119 Type 2 diabetes mellitus without complications: Secondary | ICD-10-CM | POA: Insufficient documentation

## 2015-09-15 DIAGNOSIS — Z008 Encounter for other general examination: Secondary | ICD-10-CM | POA: Diagnosis present

## 2015-09-15 DIAGNOSIS — E039 Hypothyroidism, unspecified: Secondary | ICD-10-CM

## 2015-09-15 DIAGNOSIS — F203 Undifferentiated schizophrenia: Secondary | ICD-10-CM | POA: Diagnosis not present

## 2015-09-15 DIAGNOSIS — E870 Hyperosmolality and hypernatremia: Secondary | ICD-10-CM | POA: Insufficient documentation

## 2015-09-15 DIAGNOSIS — Z79899 Other long term (current) drug therapy: Secondary | ICD-10-CM | POA: Insufficient documentation

## 2015-09-15 DIAGNOSIS — Z046 Encounter for general psychiatric examination, requested by authority: Secondary | ICD-10-CM

## 2015-09-15 DIAGNOSIS — Z9114 Patient's other noncompliance with medication regimen: Secondary | ICD-10-CM | POA: Insufficient documentation

## 2015-09-15 DIAGNOSIS — Z7982 Long term (current) use of aspirin: Secondary | ICD-10-CM | POA: Insufficient documentation

## 2015-09-15 HISTORY — DX: Hyperlipidemia, unspecified: E78.5

## 2015-09-15 HISTORY — DX: Schizophrenia, unspecified: F20.9

## 2015-09-15 HISTORY — DX: Disorder of thyroid, unspecified: E07.9

## 2015-09-15 HISTORY — DX: Type 2 diabetes mellitus without complications: E11.9

## 2015-09-15 NOTE — ED Notes (Signed)
Unable to complete the rest of triage screening due to patient inability or refusal to provide answers.
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
## 2015-09-16 DIAGNOSIS — F203 Undifferentiated schizophrenia: Secondary | ICD-10-CM

## 2015-09-16 DIAGNOSIS — F2089 Other schizophrenia: Secondary | ICD-10-CM | POA: Diagnosis not present

## 2015-09-16 DIAGNOSIS — E119 Type 2 diabetes mellitus without complications: Secondary | ICD-10-CM

## 2015-09-16 DIAGNOSIS — E039 Hypothyroidism, unspecified: Secondary | ICD-10-CM

## 2015-09-16 DIAGNOSIS — R7303 Prediabetes: Secondary | ICD-10-CM

## 2015-09-16 LAB — CBC WITH DIFFERENTIAL/PLATELET
BASOS ABS: 0.2 10*3/uL — AB (ref 0–0.1)
Basophils Relative: 2 %
EOS PCT: 0 %
Eosinophils Absolute: 0 10*3/uL (ref 0–0.7)
HCT: 40.9 % (ref 40.0–52.0)
Hemoglobin: 13.4 g/dL (ref 13.0–18.0)
Lymphocytes Relative: 18 %
Lymphs Abs: 1.3 10*3/uL (ref 1.0–3.6)
MCH: 26.1 pg (ref 26.0–34.0)
MCHC: 32.7 g/dL (ref 32.0–36.0)
MCV: 79.9 fL — AB (ref 80.0–100.0)
MONO ABS: 0.9 10*3/uL (ref 0.2–1.0)
MONOS PCT: 12 %
NEUTROS PCT: 68 %
Neutro Abs: 5 10*3/uL (ref 1.4–6.5)
PLATELETS: 233 10*3/uL (ref 150–440)
RBC: 5.12 MIL/uL (ref 4.40–5.90)
RDW: 13.9 % (ref 11.5–14.5)
WBC: 7.3 10*3/uL (ref 3.8–10.6)

## 2015-09-16 LAB — COMPREHENSIVE METABOLIC PANEL
ALK PHOS: 44 U/L (ref 38–126)
ALT: 12 U/L — ABNORMAL LOW (ref 17–63)
ANION GAP: 8 (ref 5–15)
AST: 24 U/L (ref 15–41)
Albumin: 4.4 g/dL (ref 3.5–5.0)
BILIRUBIN TOTAL: 0.7 mg/dL (ref 0.3–1.2)
BUN: 46 mg/dL — ABNORMAL HIGH (ref 6–20)
CALCIUM: 9.4 mg/dL (ref 8.9–10.3)
CO2: 29 mmol/L (ref 22–32)
Chloride: 114 mmol/L — ABNORMAL HIGH (ref 101–111)
Creatinine, Ser: 1.52 mg/dL — ABNORMAL HIGH (ref 0.61–1.24)
GFR calc non Af Amer: 48 mL/min — ABNORMAL LOW (ref >60)
GFR, EST AFRICAN AMERICAN: 56 mL/min — AB (ref >60)
Glucose, Bld: 146 mg/dL — ABNORMAL HIGH (ref 65–99)
POTASSIUM: 4.5 mmol/L (ref 3.5–5.1)
Sodium: 151 mmol/L — ABNORMAL HIGH (ref 135–145)
TOTAL PROTEIN: 7.9 g/dL (ref 6.5–8.1)

## 2015-09-16 LAB — ETHANOL

## 2015-09-16 LAB — CARBAMAZEPINE LEVEL, TOTAL

## 2015-09-16 LAB — SODIUM: Sodium: 145 mmol/L (ref 135–145)

## 2015-09-16 LAB — SALICYLATE LEVEL: Salicylate Lvl: <4 mg/dL (ref 2.8–30.0)

## 2015-09-16 LAB — ACETAMINOPHEN LEVEL: Acetaminophen (Tylenol), Serum: <10 ug/mL — ABNORMAL LOW (ref 10–30)

## 2015-09-16 MED ORDER — CARBAMAZEPINE 200 MG PO TABS
200.0000 mg | ORAL_TABLET | Freq: Two times a day (BID) | ORAL | Status: DC
  Administered 2015-09-16 – 2015-09-24 (×7): 200 mg via ORAL
  Filled 2015-09-16 (×7): qty 1

## 2015-09-16 MED ORDER — TRAZODONE HCL 100 MG PO TABS
100.0000 mg | ORAL_TABLET | Freq: Every day | ORAL | Status: DC
  Administered 2015-09-16: 100 mg via ORAL
  Filled 2015-09-16 (×5): qty 1

## 2015-09-16 MED ORDER — LEVOTHYROXINE SODIUM 75 MCG PO TABS
75.0000 ug | ORAL_TABLET | Freq: Every day | ORAL | Status: DC
  Administered 2015-09-17 – 2015-09-24 (×4): 75 ug via ORAL
  Filled 2015-09-16 (×7): qty 1

## 2015-09-16 MED ORDER — OLANZAPINE 10 MG PO TBDP
20.0000 mg | ORAL_TABLET | Freq: Once | ORAL | Status: AC
  Administered 2015-09-16: 20 mg via ORAL

## 2015-09-16 MED ORDER — SIMVASTATIN 40 MG PO TABS
20.0000 mg | ORAL_TABLET | Freq: Every day | ORAL | Status: DC
  Administered 2015-09-16 – 2015-09-23 (×3): 20 mg via ORAL
  Filled 2015-09-16: qty 2
  Filled 2015-09-16: qty 1

## 2015-09-16 MED ORDER — CLONAZEPAM 0.5 MG PO TBDP
0.5000 mg | ORAL_TABLET | Freq: Two times a day (BID) | ORAL | Status: DC
  Administered 2015-09-16 – 2015-09-24 (×6): 0.5 mg via ORAL
  Filled 2015-09-16 (×10): qty 1

## 2015-09-16 MED ORDER — LORAZEPAM 2 MG/ML IJ SOLN
2.0000 mg | Freq: Once | INTRAMUSCULAR | Status: AC
  Administered 2015-09-16: 2 mg via INTRAVENOUS

## 2015-09-16 MED ORDER — LORAZEPAM 2 MG/ML IJ SOLN
INTRAMUSCULAR | Status: AC
  Administered 2015-09-16: 2 mg via INTRAVENOUS
  Filled 2015-09-16: qty 1

## 2015-09-16 MED ORDER — OLANZAPINE 10 MG PO TBDP
20.0000 mg | ORAL_TABLET | Freq: Once | ORAL | Status: DC
  Filled 2015-09-16: qty 2

## 2015-09-16 MED ORDER — OLANZAPINE 10 MG PO TABS
ORAL_TABLET | ORAL | Status: AC
  Filled 2015-09-16: qty 2

## 2015-09-16 MED ORDER — OLANZAPINE 10 MG PO TBDP
20.0000 mg | ORAL_TABLET | Freq: Every day | ORAL | Status: DC
  Filled 2015-09-16 (×6): qty 2

## 2015-09-16 MED ORDER — ZIPRASIDONE MESYLATE 20 MG IM SOLR
20.0000 mg | Freq: Once | INTRAMUSCULAR | Status: AC
  Administered 2015-09-16: 20 mg via INTRAMUSCULAR
  Filled 2015-09-16: qty 20

## 2015-09-16 MED ORDER — HALOPERIDOL LACTATE 5 MG/ML IJ SOLN
10.0000 mg | Freq: Once | INTRAMUSCULAR | Status: AC
  Administered 2015-09-16: 10 mg via INTRAMUSCULAR

## 2015-09-16 MED ORDER — HALOPERIDOL LACTATE 5 MG/ML IJ SOLN
INTRAMUSCULAR | Status: AC
  Administered 2015-09-16: 10 mg via INTRAMUSCULAR
  Filled 2015-09-16: qty 2

## 2015-09-16 MED ORDER — METFORMIN HCL 500 MG PO TABS
1000.0000 mg | ORAL_TABLET | Freq: Two times a day (BID) | ORAL | Status: DC
  Administered 2015-09-16 – 2015-09-24 (×8): 1000 mg via ORAL
  Filled 2015-09-16 (×9): qty 2

## 2015-09-16 MED ORDER — CLONAZEPAM 0.5 MG PO TABS
ORAL_TABLET | ORAL | Status: AC
  Filled 2015-09-16: qty 1

## 2015-09-16 NOTE — ED Notes (Signed)
This RN took over assignment from MerrifieldAlly, Charity fundraiserN. Per Connye BurkittAlly, 6 people were required to stick patient for blood work, and patient is calm and cooperative as long as he is not being touched or forced to do something. Upon assessment this RN attempted to change patient out of his own soiled clothing but was unsuccessful. Pt refused repeatedly and any time this RN attempted to assist patient out of clothing he became agitated repeating "It's my take back time" and mumbling religous delusions, this RN only able to make out "God gave me this". When this RN stepped back from patient due to agitation, pt began to calm. Pt noted to be looking around the room mumbling incoherently with some phrases able to be made out. Pt noted to be soiled, but states that his clothes are clean at this time. Per Dr. Cyril LoosenKinner, pt is okay to remain in his own clothes at this time, patient does deny wanting to hurt himself or anyone else. This RN will continue to attempt to change patient out. Per Dr. Freddrick MarchVachaini nursing staff is to encourage patient to continue drinking water.

## 2015-09-16 NOTE — BHH Counselor (Signed)
[name] counselor unable to complete assessment, pt refuses to open eyes and respond to questions.

## 2015-09-16 NOTE — Progress Notes (Signed)
I was called for admission for dehydration and hypernatremia  Pt have severe psych disorder, locked himself in closet and did not eat- drink and change cloths for few days.  He was very unco-operative intiially to ER.  From medical point- he only need some fluid for now for his dehydration.  On my interaction with pt, he is completely not oriented, but I handed over a glass of water and he started drinking, after that he stood up walked to sink and refilled his glass twice and drank that on his own.  I suggest- rather than confronting him and trying to get IV line- we can encourage more oral free water intake.  He definitely need treatment for his psychiatry disorder so [facility]  We will be happy to help , if need arise.

## 2015-09-16 NOTE — ED Notes (Signed)
This RN explained to patient multiple times about needing bloodwork. Pt continued to refuse and became aggressive with staff. Dorinda Hillonald, RN at bedside at this time. Dr. Cyril LoosenKinner notified and orders received for 10 of haldol and 2 of Ativan. This RN notified police officers and EDT's for assistance at this time. Pt tolerated injection poorly stating repeatedly that this was "against [his] creator". After receiving injections pt refused to let RN scan his bracelet. Pt then covered up with blankets at this time and rolled to his side. Will continue to monitor patient and attempt to draw blood when patient is more calm.

## 2015-09-16 NOTE — Progress Notes (Signed)
[name], LCSWA with [facility] team visited patient in [place] and provided current [facility] and contact info.  Office [phone] Crisis [phone]   Patient is alert and oriented X2 on admission to [place], and disoriented to situation and time. Pt mood is anxious and his affect is bizarre. Pt speech is difficult to understand and tangential. Pt is a poor historian but is following staff instructions and is pleasant and cooperative. Pt is also taking medications as prescribed. 15 minute checks are ongoing for safety. ACTT team member met with patient as well. Writer will continue to monitor.

## 2015-09-16 NOTE — BH Assessment (Addendum)
Assessment Note  [name] is a 6'6" tall, 230 lbs 60 y.o., African American male, who presents to the ER due to his Group Home having concerns about his physical and mental health. Majority of the information gathered for this assessment was from patient Medical chart and Group Home Staff.  Patient has a history of stopping his medications and being religious preoccupied.  According to the Group Home, the last 3 to 4 days, the patient symptoms of schizophrenia has increased.  He only eat once a day and refusing medications. Group Home staff states, they are unsure how long he has gone without his medications. They found some of his medications in his room, when they were cleaning it. Patient usually take his meds in front of staff and is also required to check his mouth. However, he has learned how to hide the pills in his mouth, so staff can't see them. When he goes to his room, he hides them in his closet. Patient goes in his closet and sit there for several hours "mumbling to his self." In the past, patient was going to his closet to prayer, "because that is what Jesus said in the scriptures."  Patient hygiene has decrease. Patient has refused to bathe and has had on the same clothes for several days. Patient refused to take them off and allow Group Home staff wash them. Patient is currently disheveled and have a strong odor coming from his person.   During the interview, the patient answers did not coincide with the questions. Patient was pleasant with this Clinical research associate and attempted to participate but he was also having thought blocking. Patient is currently disorganized and confused. Upon arrival to the ER it took several staff members to restrain him to get lab work. They were unsuccessful with having him change his clothing.  Patient is currently living at "[facility] [name]-[phone])." He moved in 04/2015, from "[facility]." Group Home staff was unsure from what  hospital he discharged from.  [facility] DSS, is his Guardian Gregory [phone]).   Patient currently receives Outpatient Treatment with [facility] [phone]), with their ACT Team.  Diagnosis: Schizophrenia  Past Medical History:  Past Medical History  Diagnosis Date  . Schizophrenia (HCC)   . Diabetes mellitus without complication (HCC)   . Thyroid disease   . Hyperlipemia     No past surgical history on file.  Family History: No family history on file.  Social History:  has no tobacco, alcohol, and drug history on file.  Additional Social History:  Alcohol / Drug Use Pain Medications: See PTA Prescriptions: See PTA Over the Counter: See PTA History of alcohol / drug use?: No history of alcohol / drug abuse Longest period of sobriety (when/how long): No none history of the use of mind altering substances Negative Consequences of Use:  (None) Withdrawal Symptoms:  (None)  CIWA: CIWA-Ar BP: 126/85 mmHg Pulse Rate: 96 COWS:    Allergies: Not on File  Home Medications:  (Not in a hospital admission)  OB/GYN Status:  No LMP for male patient.  General Assessment Data Location of Assessment: [facility] ED [name] Assessment: In system Is this a Tele or Face-to-Face Assessment?: Face-to-Face Is this an Initial Assessment or a Re-assessment for this encounter?: Initial Assessment Marital status: Single Maiden name: n/a Is patient pregnant?: No Pregnancy Status: No Living Arrangements: Group Home ([facility]) Can pt return to current living arrangement?: Yes Admission Status: Involuntary Is patient capable of  signing voluntary admission?: No Referral Source: Self/Family/Friend Insurance type: Medicare  Medical Screening Exam [facility] Walk-in ONLY) Medical Exam completed: Yes  Crisis Care Plan Living Arrangements: Group Home ([facility]) Legal Guardian: Other: [facility] DSS)  Education Status Is  patient currently in school?: No Current Grade: n/a Highest grade of school patient has completed: Patient didnt' answer Name of school: n/a Contact person: n/a  Risk to self with the past 6 months Suicidal Ideation: No Has patient been a risk to self within the past 6 months prior to admission? : No Suicidal Intent: No Has patient had any suicidal intent within the past 6 months prior to admission? : No Is patient at risk for suicide?: No Suicidal Plan?: No Has patient had any suicidal plan within the past 6 months prior to admission? : No Access to Means: No What has been your use of drugs/alcohol within the last 12 months?: None Reported or noted Previous Attempts/Gestures: No How many times?: 0 Other Self Harm Risks: None Reported or noted Triggers for Past Attempts: None known Intentional Self Injurious Behavior: None Family Suicide History: Unknown Recent stressful life event(s): Other (Comment) (History of SPMI) Persecutory voices/beliefs?: No (Unknown, patient didn't answer) Depression: No Depression Symptoms: Isolating Substance abuse history and/or treatment for substance abuse?: No Suicide prevention information given to non-admitted patients: Not applicable  Risk to Others within the past 6 months Homicidal Ideation: No Does patient have any lifetime risk of violence toward others beyond the six months prior to admission? : No Thoughts of Harm to Others: No Current Homicidal Intent: No Current Homicidal Plan: No Access to Homicidal Means: No Identified Victim: None Reported History of harm to others?: No Assessment of Violence: On admission Violent Behavior Description: When taking blood work during admission process. Does patient have access to weapons?: No Criminal Charges Pending?: No Does patient have a court date: No Is patient on probation?: No  Psychosis Hallucinations:  (Unknown, Patient wouldn't answer) Delusions:  (Religious)  Mental Status  Report Appearance/Hygiene: In scrubs, In hospital gown, Unable to Assess Eye Contact: Fair Motor Activity: Freedom of movement, Unremarkable Speech: Incoherent, Word salad Level of Consciousness: Alert Mood: Preoccupied, Pleasant, Suspicious Affect: Flat, Blunted Anxiety Level: Minimal Thought Processes: Irrelevant, Flight of Ideas, Relevant Judgement: Impaired Orientation: Unable to assess Obsessive Compulsive Thoughts/Behaviors: Unable to Assess  Cognitive Functioning Concentration: Decreased Memory: Unable to Assess IQ: Average Insight: Unable to Assess Impulse Control: Unable to Assess Appetite: Fair (Per Group Home Staff) Weight Loss: 0 Weight Gain: 0 Sleep: Decreased Total Hours of Sleep: 5 Vegetative Symptoms: Not bathing, Decreased grooming  ADLScreening [facility]) Patient's cognitive ability adequate to safely complete daily activities?: Yes Patient able to express need for assistance with ADLs?: Yes Independently performs ADLs?: Yes (appropriate for developmental age)  Prior Inpatient Therapy Prior Inpatient Therapy: Yes Prior Therapy Dates: Unknown Prior Therapy Facilty/Provider(s): [facility] Reason for Treatment: Schizophrenia  Prior Outpatient Therapy Prior Outpatient Therapy: Yes Prior Therapy Dates: 2016 Prior Therapy Facilty/Provider(s): [facility] Reason for Treatment: Schizophrenia Does patient have an ACCT team?: No Does patient have Intensive In-House Services?  : No Does patient have [facility] services? : No Does patient have P4CC services?: No  ADL Screening (condition at time of admission) Patient's cognitive ability adequate to safely complete daily activities?: Yes Is the patient deaf or have difficulty hearing?: No Does the patient have difficulty seeing, even when wearing glasses/contacts?: No Does the patient have difficulty concentrating, remembering, or making decisions?: No Patient able to  express need for assistance with ADLs?:  Yes Does the patient have difficulty dressing or bathing?: No Independently performs ADLs?: Yes (appropriate for developmental age) Does the patient have difficulty walking or climbing stairs?: No Weakness of Legs: None Weakness of [name]/Hands: None  Home Assistive Devices/Equipment Home Assistive Devices/Equipment: None  Therapy Consults (therapy consults require a physician order) PT Evaluation Needed: No OT Evalulation Needed: No SLP Evaluation Needed: No Abuse/Neglect Assessment (Assessment to be complete while patient is alone) Physical Abuse:  (Patient didn't answer) Verbal Abuse:  (Patient didn't answer) Sexual Abuse:  (Patient didn't answer) Exploitation of patient/patient's resources:  (Patient didn't answer) Self-Neglect: Yes, present (Comment) Possible abuse reported to::  (Patient didn't answer) Values / Beliefs Cultural Requests During Hospitalization:  (Patient didn't answer) Spiritual Requests During Hospitalization:  (Patient didn't answer) Consults Spiritual Care Consult Needed: No Social Work Consult Needed: No      Additional Information 1:1 In Past 12 Months?: No CIRT Risk: No Elopement Risk: No Does patient have medical clearance?: Yes  Child/Adolescent Assessment Running Away Risk: Denies (Patient is an adult)  Disposition:  Disposition Initial Assessment Completed for this Encounter: Yes Disposition of Patient: Inpatient treatment program Type of inpatient treatment program: Adult  On Site Evaluation by:   Reviewed with Physician:    Lilyan Gilfordalvin J. Righteous [employer]iborne MS, LCAS, LPC, NCC, CCSI Therapeutic Triage Specialist 09/16/2015 11:16 AM

## 2015-09-16 NOTE — ED Notes (Signed)
Patient allowed this RN to take vitals on him as well as take off 1 pair of pants and 5 socks off of his feet. Sock and pants left in bag in patient's room. Pt also had money in the amount of $41 dollars hidden in his socks. Pt requests to keep his money in his possession, [place], EDT to witness patient placing his money back in his pocket. Pt noted to be resting in bed at this time. Pt calm and somewhat cooperative with staff at this time.

## 2015-09-16 NOTE — ED Notes (Signed)
Pt resting in bed at this time. Pt calm and appropriate at this time.Continues to have A/V hallucinations at this time. Will continue to attempt to get patient dressed out at this time. Will continue to monitor with 15 min safety checks.

## 2015-09-16 NOTE — ED Provider Notes (Signed)
[facility] Emergency Department Provider Note  ____________________________________________  Time seen: Approximately 12:06 AM  I have reviewed the triage vital signs and the nursing notes.   HISTORY  Chief Complaint Psychiatric Evaluation and URI  History and review of systems is limited by the patient's refusal to speak with me likely secondary to his chronic psychiatric illness  HPI [name] is a 60 y.o. male with a history of severe schizophrenia who resents by EMS for evaluation of what is essentially altered mental status.  The patient will provide no history and just stares at me without speaking but officers are present because they were called out to the group home as well.  They are investigating the group home for neglect because of the conditions in which the patient was found.  He has a bottle of pills that he has apparently been chewing up and spitting out for an unspecified amount of time.  The officer reports that the patient has been praying in his closet and urinating on himself and in the closet.  There was report of upper respiratory symptoms from the group home but it is unclear if that was what they reported to get to the hospital or if he is in fact having a recent illness.  Review of his medical record indicates multiple hospitalizations at [facility] for prolonged periods of time due to severe psychiatric illness.  He reportedly becomes aggressive when he has psychotic.  He has become hyperreligious in the past.  He was last evaluated at this hospital in 2015 and was apparently sent to see R at the time.  No other information is currently available.   Past Medical History  Diagnosis Date  . Schizophrenia (HCC)   . Diabetes mellitus without complication (HCC)   . Thyroid disease   . Hyperlipemia     There are no active problems to display for this patient.   No past surgical history on file.  Current Outpatient Rx  Name  Route  Sig   Dispense  Refill  . aspirin EC 81 MG tablet   Oral   Take 81 mg by mouth daily.         . benztropine (COGENTIN) 0.5 MG tablet   Oral   Take 0.5 mg by mouth 2 (two) times daily.         . carbamazepine (TEGRETOL) 200 MG tablet   Oral   Take 200 mg by mouth 2 (two) times daily.         . clonazePAM (KLONOPIN) 0.5 MG tablet   Oral   Take 0.5 mg by mouth 2 (two) times daily.         Marland Kitchen. levothyroxine (SYNTHROID, LEVOTHROID) 75 MCG tablet   Oral   Take 75 mcg by mouth daily before breakfast.         . metFORMIN (GLUCOPHAGE) 1000 MG tablet   Oral   Take 1,000 mg by mouth 2 (two) times daily with a meal.         . OLANZapine (ZYPREXA) 15 MG tablet   Oral   Take 15 mg by mouth 2 (two) times daily.         . simvastatin (ZOCOR) 20 MG tablet   Oral   Take 20 mg by mouth daily.         . traZODone (DESYREL) 100 MG tablet   Oral   Take 100 mg by mouth at bedtime.           Allergies  Review of patient's allergies indicates not on file.  No family history on file.  Social History Social History  Substance Use Topics  . Smoking status: None  . Smokeless tobacco: None  . Alcohol Use: None    Review of Systems Unable to obtain due to lack of cooperation by the patient  ____________________________________________   PHYSICAL EXAM:  VITAL SIGNS: ED Triage Vitals  Enc Vitals Group     BP 09/15/15 2232 139/99 mmHg     Pulse Rate 09/15/15 2232 99     Resp 09/15/15 2232 16     Temp 09/15/15 2232 98.3 F (36.8 C)     Temp Source 09/15/15 2232 Oral     SpO2 09/15/15 2232 97 %     Weight --      Height --      Head Cir --      Peak Flow --      Pain Score --      Pain Loc --      Pain Edu? --      Excl. in GC? --     Constitutional: Alert and in no acute distress but will not speak. Eyes: Conjunctivae are normal. PERRL. EOMI. Head: Atraumatic. Nose: No congestion/rhinnorhea. Mouth/Throat: Mucous membranes are moist.  Oropharynx  non-erythematous. Neck: No stridor.  No meningeal signs.   Cardiovascular: Normal rate, regular rhythm. Good peripheral circulation. Grossly normal heart sounds.   Respiratory: Normal respiratory effort.  No retractions. Lungs CTAB. Gastrointestinal: Soft and nontender. No distention.  Thin body habitus Musculoskeletal: No lower extremity tenderness nor edema. No gross deformities of extremities. Neurologic: No gross neurological deficits are appreciated including no gross cranial nerve deficits.  The police officer reports that the patient did speak to him briefly about basketball and he had no slurred speech.  The patient will not speak to me at all but is moving all 4 extremities.  Skin:  Skin is warm, dry and intact. No rash noted. Psychiatric: Mood and affect are quiet and uncooperative but not aggressive.  ____________________________________________   LABS (all labs ordered are listed, but only abnormal results are displayed)  Labs Reviewed  COMPREHENSIVE METABOLIC PANEL - Abnormal; Notable for the following:    Sodium 151 (*)    Chloride 114 (*)    Glucose, Bld 146 (*)    BUN 46 (*)    Creatinine, Ser 1.52 (*)    ALT 12 (*)    GFR calc non Af Amer 48 (*)    GFR calc Af Amer 56 (*)    All other components within normal limits  CBC WITH DIFFERENTIAL/PLATELET - Abnormal; Notable for the following:    MCV 79.9 (*)    Basophils Absolute 0.2 (*)    All other components within normal limits  ACETAMINOPHEN LEVEL - Abnormal; Notable for the following:    Acetaminophen (Tylenol), Serum <10 (*)    All other components within normal limits  CARBAMAZEPINE LEVEL, TOTAL - Abnormal; Notable for the following:    Carbamazepine Lvl <2.0 (*)    All other components within normal limits  ETHANOL  SALICYLATE LEVEL  URINE DRUG SCREEN, QUALITATIVE ([facility] ONLY)  URINALYSIS COMPLETEWITH MICROSCOPIC ([facility] ONLY)  SODIUM, URINE, RANDOM  OSMOLALITY, URINE    ____________________________________________  EKG  None ____________________________________________  RADIOLOGY   No results found.  ____________________________________________   PROCEDURES  Procedure(s) performed: None  Critical Care performed: No ____________________________________________   INITIAL IMPRESSION / ASSESSMENT AND PLAN / ED COURSE  Pertinent labs &  imaging results that were available during my care of the patient were reviewed by me and considered in my medical decision making (see chart for details).  The patient has no obvious acute medical issues at this time L long history of severe psychiatric disease.  It appears he has been noncompliant with his medications at his group home.  I will check for any acute medical issues but I anticipate this will be a psychiatric disposition given his decline  ----------------------------------------- 1:22 AM on 09/16/2015 -----------------------------------------  The patient is refusing to have his blood drawn and is becoming increasingly agitated.  Given his history of violence when psychotic and his escalating behavior, I have ordered Geodon 20 mg IM which I believe will be safer for him given his age and history on olanzapine than would a "B-52".   ----------------------------------------- 5:36 AM on 09/16/2015 -----------------------------------------  Lab work shows acute on chronic renal insufficiency and hypernatremia, likely hypovolemic.  I have added on urine sodium and urine osmolality and him and admitting to medicine for management of the hypernatremia.  He remains under involuntary commitment.  ----------------------------------------- 7:48 AM on 09/16/2015 -----------------------------------------  The hospitalist has spoken with Dr. Toni Amend.  Dr. Toni Amend has to evaluate the patient in the emergency department and determine whether or not a medical admission is advisable.  If so, the hospitalist  will admit.  If not, the hospitalist will put in a consult note to help guide further treatment for his hypernatremia.  The patient is currently willing to take by mouth fluids. ____________________________________________  FINAL CLINICAL IMPRESSION(S) / ED DIAGNOSES  Final diagnoses:  Other schizophrenia (HCC)  Hypernatremia  Noncompliance with medication regimen  Involuntary commitment      NEW MEDICATIONS STARTED DURING THIS VISIT:  New Prescriptions   No medications on file      Note:  This document was prepared using Dragon voice recognition software and may include unintentional dictation errors.   Loleta Rose, MD 09/16/15 [phone]

## 2015-09-16 NOTE — ED Notes (Signed)
Pt. Came to door and asked breakfast times. Staff verbalized to pt. That morning protocol was to get vitals and then breakfast would be served around 7:30-8:00am. I asked pt. If he would allow us to take his vitals but pt. Seemed to ignore my responses. I pressed (2x)  to get vitals from pt/ Pt. Verbalized to staff, " Rather not... No, I don't" in regards to checking his vitals. RN was notified of this event. Staff did not press pt. Any further regarding vitals at this time to prevent further irritation. Night shift will notify day shift that pt. Has been prompted about vitals and reminded that we will check them, before he receives breakfast.

## 2015-09-16 NOTE — ED Notes (Signed)
Attempted to convince patient to get a shower. Pt refused at this time. This RN made several attempts to get patient to shower and take off dirty clothes with no success.

## 2015-09-16 NOTE — Consult Note (Signed)
[facility] Consult   Reason for Consult:  Consult for 60 year old man with schizophrenia and comes into the hospital off his medicine Referring Physician:  Corky Downs Patient Identification: Hue Frick MRN:  [id number] Principal Diagnosis: Schizophrenia [facility]) Diagnosis:   Patient Active Problem List   Diagnosis Date Noted  . Schizophrenia ([place]) [F20.9] 09/16/2015  . Dehydration [E86.0] 09/16/2015  . Hypernatremia [E87.0] 09/16/2015  . Diabetes ([place]) [E11.9] 09/16/2015  . Hypothyroid [E03.9] 09/16/2015  . Noncompliance [Z91.19] 09/16/2015  . Agitation [R45.1] 09/16/2015    Total Time spent with patient: 1 hour  Subjective:   Dyshon Philbin is a 60 y.o. male patient admitted with "I hope, I hope".  HPI:  Patient interviewed although he is not a very helpful historian. Chart reviewed including multiple old notes. Referral paperwork reviewed. Case discussed with hospitalist and emergency room physician and [name]. Reviewed his labs. 60 year old man with a long-standing history of schizophrenia who is well known to our hospital as well who was sent here from his group home. They report for several days at least he has been off of his medicine. Appears to be cheeking it. He has gotten more psychotic and confused and uncommunicative. He has been spending his time in his room isolating himself. Has stopped eating or at least cut down to no more than one meal a day. Refuses to change his clothes or cooperate or wash. This is all pretty typical behavior for this patient who has a history of noncompliance with his schizophrenia. On presentation to the emergency room labs were done which showed a sodium level of 151 and an elevated BUN and creatinine. Almost certainly caused by dehydration from not eating and drinking. Since being in the ER he has been eating and drinking. However he continues to be noncompliant with treatment. He has refused to allow a second blood draw. He has not taken oral  medicines yet. He has had to be spread strained for forcing IM medicines. Patient is very confused and paranoid and unable to hold a rational conversation.  Social history: Patient lives in a group home. Schizophrenia going back probably almost 40 years. Unclear if any family are still in ball but it doesn't look like they are I'm not sure who his guardian is.  Medical history: Patient has diabetes, history of hypothyroidism current hypernatremia.  Substance abuse history: Patient does not typically abuse substances there is no history of it and no indication of a current problem.  Past Psychiatric History: Long-standing history of schizophrenia. He frequently is noncompliant with his medicines. Always refuses long-acting injectables. Has had multiple hospitalizations as a result. Looking at our hospital notes going back at least 2013 it has been the general understanding of the hospital that admitting him to our inpatient ward is unproductive because of his noncompliance and refusal to cooperate with treatment. We have instead preferred to try to refer him to [facility]. At some times he has needed to be referred at other times he eventually takes his medicine. Patient does not have a history of suicide attempts he does have a history of agitation mainly when medicine and treatment is forced on him.  Risk to Self: Suicidal Ideation: No Suicidal Intent: No Is patient at risk for suicide?: No Suicidal Plan?: No Access to Means: No What has been your use of drugs/alcohol within the last 12 months?: None Reported or noted How many times?: 0 Other Self Harm Risks: None Reported or noted Triggers for Past Attempts: None known Intentional  Self Injurious Behavior: None Risk to Others: Homicidal Ideation: No Thoughts of Harm to Others: No Current Homicidal Intent: No Current Homicidal Plan: No Access to Homicidal Means: No Identified Victim: None Reported History of harm to others?:  No Assessment of Violence: On admission Violent Behavior Description: When taking blood work during admission process. Does patient have access to weapons?: No Criminal Charges Pending?: No Does patient have a court date: No Prior Inpatient Therapy: Prior Inpatient Therapy: Yes Prior Therapy Dates: Unknown Prior Therapy Facilty/Provider(s): [facility] Reason for Treatment: Schizophrenia Prior Outpatient Therapy: Prior Outpatient Therapy: Yes Prior Therapy Dates: 2016 Prior Therapy Facilty/Provider(s): [facility] Reason for Treatment: Schizophrenia Does patient have an ACCT team?: No Does patient have Intensive In-House Services?  : No Does patient have [facility] services? : No Does patient have P4[facility] services?: No  Past Medical History:  Past Medical History  Diagnosis Date  . Schizophrenia ([place])   . Diabetes mellitus without complication ([place])   . Thyroid disease   . Hyperlipemia    No past surgical history on file. Family History: No family history on file. Family Psychiatric  History: Patient is unable to give any history about that. Social History:  History  Alcohol Use: Not on file     History  Drug Use Not on file    Social History   Social History  . Marital Status: Single    Spouse Name: N/A  . Number of Children: N/A  . Years of Education: N/A   Social History Main Topics  . Smoking status: None  . Smokeless tobacco: None  . Alcohol Use: None  . Drug Use: None  . Sexual Activity: Not Asked   Other Topics Concern  . None   Social History Narrative  . None   Additional Social History:    Allergies:  Not on File  Labs:  Results for orders placed or performed during the hospital encounter of 09/15/15 (from the past 48 hour(s))  Comprehensive metabolic panel     Status: Abnormal   Collection Time: 09/16/15  4:15 AM  Result Value Ref Range   Sodium 151 (H) 135 - 145 mmol/L   Potassium 4.5 3.5 - 5.1 mmol/L   Chloride 114 (H) 101 - 111 mmol/L   CO2 29 22 -  32 mmol/L   Glucose, Bld 146 (H) 65 - 99 mg/dL   BUN 46 (H) 6 - 20 mg/dL   Creatinine, Ser 1.52 (H) 0.61 - 1.24 mg/dL   Calcium 9.4 8.9 - 10.3 mg/dL   Total Protein 7.9 6.5 - 8.1 g/dL   Albumin 4.4 3.5 - 5.0 g/dL   AST 24 15 - 41 U/L   ALT 12 (L) 17 - 63 U/L   Alkaline Phosphatase 44 38 - 126 U/L   Total Bilirubin 0.7 0.3 - 1.2 mg/dL   GFR calc non Af Amer 48 (L) >60 mL/min   GFR calc Af Amer 56 (L) >60 mL/min    Comment: (NOTE) The eGFR has been calculated using the CKD EPI equation. This calculation has not been validated in all clinical situations. eGFR's persistently <60 mL/min signify possible Chronic Kidney Disease.    Anion gap 8 5 - 15  Ethanol     Status: None   Collection Time: 09/16/15  4:15 AM  Result Value Ref Range   Alcohol, Ethyl (B) <5 <5 mg/dL    Comment:        LOWEST DETECTABLE LIMIT FOR SERUM ALCOHOL IS 5 mg/dL FOR MEDICAL PURPOSES ONLY  CBC WITH DIFFERENTIAL     Status: Abnormal   Collection Time: 09/16/15  4:15 AM  Result Value Ref Range   WBC 7.3 3.8 - 10.6 K/uL   RBC 5.12 4.40 - 5.90 MIL/uL   Hemoglobin 13.4 13.0 - 18.0 g/dL   HCT 40.9 40.0 - 52.0 %   MCV 79.9 (L) 80.0 - 100.0 fL   MCH 26.1 26.0 - 34.0 pg   MCHC 32.7 32.0 - 36.0 g/dL   RDW 13.9 11.5 - 14.5 %   Platelets 233 150 - 440 K/uL   Neutrophils Relative % 68 %   Neutro Abs 5.0 1.4 - 6.5 K/uL   Lymphocytes Relative 18 %   Lymphs Abs 1.3 1.0 - 3.6 K/uL   Monocytes Relative 12 %   Monocytes Absolute 0.9 0.2 - 1.0 K/uL   Eosinophils Relative 0 %   Eosinophils Absolute 0.0 0 - 0.7 K/uL   Basophils Relative 2 %   Basophils Absolute 0.2 (H) 0 - 0.1 K/uL  Salicylate level     Status: None   Collection Time: 09/16/15  4:15 AM  Result Value Ref Range   Salicylate Lvl <3.8 2.8 - 30.0 mg/dL  Acetaminophen level     Status: Abnormal   Collection Time: 09/16/15  4:15 AM  Result Value Ref Range   Acetaminophen (Tylenol), Serum <10 (L) 10 - 30 ug/mL    Comment:        THERAPEUTIC  CONCENTRATIONS VARY SIGNIFICANTLY. A RANGE OF 10-30 ug/mL MAY BE AN EFFECTIVE CONCENTRATION FOR MANY PATIENTS. HOWEVER, SOME ARE BEST TREATED AT CONCENTRATIONS OUTSIDE THIS RANGE. ACETAMINOPHEN CONCENTRATIONS >150 ug/mL AT 4 HOURS AFTER INGESTION AND >50 ug/mL AT 12 HOURS AFTER INGESTION ARE OFTEN ASSOCIATED WITH TOXIC REACTIONS.   Carbamazepine level, total     Status: Abnormal   Collection Time: 09/16/15  4:15 AM  Result Value Ref Range   Carbamazepine Lvl <2.0 (L) 4.0 - 12.0 ug/mL    Current Facility-Administered Medications  Medication Dose Route Frequency Provider Last Rate Last Dose  . carbamazepine (TEGRETOL) tablet 200 mg  200 mg Oral BID AC Lynk Marti T Wilhelm Ganaway, MD      . clonazePAM (KLONOPIN) 0.5 MG tablet           . clonazePAM (KLONOPIN) disintegrating tablet 0.5 mg  0.5 mg Oral BID Gonzella Lex, MD      . Derrill Memo ON 09/17/2015] levothyroxine (SYNTHROID, LEVOTHROID) tablet 75 mcg  75 mcg Oral QAC breakfast Gonzella Lex, MD      . metFORMIN (GLUCOPHAGE) tablet 1,000 mg  1,000 mg Oral BID WC Kinzly Pierrelouis T Lindaann Gradilla, MD      . OLANZapine (ZYPREXA) 10 MG tablet           . OLANZapine zydis (ZYPREXA) disintegrating tablet 20 mg  20 mg Oral QHS Gonzella Lex, MD      . simvastatin (ZOCOR) tablet 20 mg  20 mg Oral q1800 Gonzella Lex, MD      . traZODone (DESYREL) tablet 100 mg  100 mg Oral QHS Gonzella Lex, MD       Current Outpatient Prescriptions  Medication Sig Dispense Refill  . aspirin EC 81 MG tablet Take 81 mg by mouth daily.    . benztropine (COGENTIN) 0.5 MG tablet Take 0.5 mg by mouth 2 (two) times daily.    . carbamazepine (TEGRETOL) 200 MG tablet Take 200 mg by mouth 2 (two) times daily.    . clonazePAM (KLONOPIN) 0.5 MG tablet Take 0.5  mg by mouth 2 (two) times daily.    Marland Kitchen levothyroxine (SYNTHROID, LEVOTHROID) 75 MCG tablet Take 75 mcg by mouth daily before breakfast.    . metFORMIN (GLUCOPHAGE) 1000 MG tablet Take 1,000 mg by mouth 2 (two) times daily with a meal.     . OLANZapine (ZYPREXA) 15 MG tablet Take 15 mg by mouth 2 (two) times daily.    . simvastatin (ZOCOR) 20 MG tablet Take 20 mg by mouth daily.    . traZODone (DESYREL) 100 MG tablet Take 100 mg by mouth at bedtime.      Musculoskeletal: Strength & Muscle Tone: within normal limits Gait & Station: normal Patient leans: N/A  Psychiatric Specialty Exam: Review of Systems  Unable to perform ROS: psychiatric disorder    Blood pressure 126/85, pulse 96, temperature 98.3 F (36.8 C), temperature source Oral, resp. rate 18, SpO2 99 %.There is no height or weight on file to calculate BMI.  General Appearance: Disheveled  Eye Contact::  Minimal  Speech:  Garbled and Slow  Volume:  Decreased  Mood:  Euthymic  Affect:  Constricted  Thought Process:  Disorganized and Loose  Orientation:  Full (Time, Place, and Person)  Thought Content:  Delusions, Paranoid Ideation and Very hyper religious  Suicidal Thoughts:  No  Homicidal Thoughts:  No  Memory:  Negative  Judgement:  Poor  Insight:  Shallow  Psychomotor Activity:  Decreased  Concentration:  Poor  Recall:  Poor  Fund of Knowledge:Poor  Language: Poor  Akathisia:  No  Handed:  Right  AIMS (if indicated):     Assets:  Financial Resources/Insurance Housing  ADL's:  Impaired  Cognition: Impaired,  Mild  Sleep:      Treatment Plan Summary: Medication management and Plan Patient with a history of schizophrenia and noncompliance. Typical presentation. As far as his hypernatremia he has been drinking water since he came in and I think it's almost certainly that this is a result of dehydration. I expect that this will resolve with appropriate fluid and nutrition if he continues to drink and eat normally. He is so far however refusing to allow [place] to get another blood draw. I doubt that he needs admission to the medical service for this. On the other hand I would understand if we felt uncomfortable about bringing him down stairs as a result of  this. In any case it's been the clear policy of the department for years to not admit him to the psychiatric ward because of lack of benefit and his need for longer term forced treatment. I am not going to put in admission orders yet. I am going to put in orders for all of his usual oral medicine with the Zyprexa and Klonopin given as disintegrating medicine. We will continue to monitor him in the emergency room. I've asked [name] 2 refer [facility]. Case reviewed with emergency room staff. I will continue regular monitoring and treatment. He may need forced medicine for agitation which is certainly better done in the ER at this point  Disposition: See note above.  Alethia Berthold, MD 09/16/2015 1:10 PM

## 2015-09-16 NOTE — ED Provider Notes (Signed)
Dr. Toni Amendlapacs has seen the patient and is requesting a redraw of BMP. Patient is becoming physically violent with staff. I ordered Haldol and Ativan so that our staff can safely treat the patient.  Jene Everyobert Kaliyah Gladman, MD 09/16/15 1228

## 2015-09-16 NOTE — BH Assessment (Signed)
Writer Called patient [facility] ([phone]) and informed them he was in the ER. They state they didn't know. Writer shared with her, his current symptoms and she stated that was baseline for him. However, she will come to the ER, to see him in person to do a complete assessment. She states it will be latter this evening, more than likely after 7pm.Writer informed patient's nurse Darl Pikes(Susan).

## 2015-09-16 NOTE — ED Notes (Signed)
Pt calm and alert in the bed at this time. Will continue to monitor with Q15 min safety checks. Respirations are noted to be even and unlabored at this time.

## 2015-09-16 NOTE — [facility] Counselor (Addendum)
[facility] Update:  [facility] 20:10 requesting that patient's chemistry be repeated on 09/17/15 within the morning and faxed to their facility.  [facility] Team Update Caron Presumelysha Williams, LCSWA  [phone] office [phone] crisis (24 hours) Provider came by the ED  @ 21:20 on 09/16/15 to express her concerns for pt; states they were not aware that he was not complying with his medication; states the group home did not provide the information to their office;   states pt is hyper religious; does not communicate; no insight; low hygiene; intermittently takes medication;   states she spoke with Nurse Practitioner with [facility] who would like pt to be admitted because they have not been able to effectively treat him in the community; [facility]

## 2015-09-16 NOTE — ED Notes (Signed)
NAD noted at this time. Pt resting in bed. Pt put his socks back on over the blue socks at this time. Respirations even and unlabored at this time. Will continue to monitor with 15 min safety checks at this time.

## 2015-09-16 NOTE — ED Notes (Signed)
This RN, Vincenza HewsShane, EDT, and Mardene CelesteJoanna, EDT and Officer Paschal at bedside to draw blood and change patient out. Pt appears to be asleep at this time, awakens with physical and verbal stimuli at this time. Pt tolerated phlebotomy collect well. Due to patient's continued calmness and cooperation due to medication, pt was also placed in purple scrubs with EDT and officer help. Pt tolerated well. Pt also placed on pulse oximetry and BP cuff for further monitoring of patient status.

## 2015-09-16 NOTE — ED Notes (Signed)
Pt alert and calm at this time. Pt noted to be pacing in his room without difficulty. Will continue to monitor with 15 min safety checks at this time.

## 2015-09-16 NOTE — BH Assessment (Addendum)
Referred Patient for Inpatient Treatment:   [facility]-Declined due to aggression   [facility]([place] [phone].[phone]), Declined due to aggression and acuity.   [facility] ([place]-[phone]), Declined due to aggression and current level of acuity.   [facility] ([phone]), Declined due to current level of acuity and being unable to manage on their unit.   Authorization:  18:50- Received phone call form [employer] (Angelina-[phone]) with Tracking/Authorazation Number ([id number] for the [facility].   [facility]: Writer [facility] Nedra Hai([place] Harris-[phone]) completed verbal screening and faxed information for referral and confirmed it was received.

## 2015-09-17 DIAGNOSIS — F2089 Other schizophrenia: Secondary | ICD-10-CM | POA: Diagnosis not present

## 2015-09-17 LAB — URINALYSIS COMPLETE WITH MICROSCOPIC (ARMC ONLY)
BACTERIA UA: NONE SEEN
Bilirubin Urine: NEGATIVE
Glucose, UA: NEGATIVE mg/dL
Hgb urine dipstick: NEGATIVE
KETONES UR: NEGATIVE mg/dL
Leukocytes, UA: NEGATIVE
NITRITE: NEGATIVE
PH: 5 (ref 5.0–8.0)
PROTEIN: 30 mg/dL — AB
SPECIFIC GRAVITY, URINE: 1.027 (ref 1.005–1.030)

## 2015-09-17 LAB — URINE DRUG SCREEN, QUALITATIVE (ARMC ONLY)
AMPHETAMINES, UR SCREEN: NOT DETECTED
Barbiturates, Ur Screen: NOT DETECTED
Benzodiazepine, Ur Scrn: POSITIVE — AB
CANNABINOID 50 NG, UR ~~LOC~~: NOT DETECTED
COCAINE METABOLITE, UR ~~LOC~~: NOT DETECTED
MDMA (ECSTASY) UR SCREEN: NOT DETECTED
Methadone Scn, Ur: NOT DETECTED
Opiate, Ur Screen: NOT DETECTED
PHENCYCLIDINE (PCP) UR S: NOT DETECTED
Tricyclic, Ur Screen: NOT DETECTED

## 2015-09-17 LAB — SODIUM, URINE, RANDOM: SODIUM UR: 24 mmol/L

## 2015-09-17 LAB — OSMOLALITY, URINE: Osmolality, Ur: 874 mOsm/kg (ref 300–900)

## 2015-09-17 MED ORDER — LORAZEPAM 1 MG PO TABS
ORAL_TABLET | ORAL | Status: AC
  Administered 2015-09-17: 1 mg via ORAL
  Filled 2015-09-17: qty 1

## 2015-09-17 MED ORDER — LORAZEPAM 1 MG PO TABS
1.0000 mg | ORAL_TABLET | Freq: Once | ORAL | Status: AC
  Administered 2015-09-17: 1 mg via ORAL

## 2015-09-17 NOTE — ED Notes (Signed)
Patient is clam and quiet. He was given paper and a crayon at his request. In no apparent distress.Maintained on 15 minute checks and observation by security camera for safety.

## 2015-09-17 NOTE — ED Notes (Signed)
Maintained on 15 minute checks and observation by security camera for safety. 

## 2015-09-17 NOTE — ED Notes (Signed)
Patient resting quietly in room. No noted distress or abnormal behaviors noted. Will continue 15 minute checks and observation by security camera for safety. 

## 2015-09-17 NOTE — ED Notes (Signed)
Patient calm, cooperative with nursing interventions. Speech is garbled with disorganized thinking.  Patient ate breakfast and took fluids. With patient's permission, morning medications were crushed and given with sugar free ice cream. Patient took all ordered medications. Since it is doubtful that patient would be able to collect clean catch urine, urinal left at bedside. Patient was told we needed a urine sample.  Will continue to monitor clinical status, encourage cooperation with all treatment, and maintain all safety checks.

## 2015-09-17 NOTE — ED Notes (Signed)
Patient still refusing blood work

## 2015-09-17 NOTE — ED Notes (Signed)

## 2015-09-17 NOTE — ED Provider Notes (Signed)
-----------------------------------------   7:11 AM on 09/17/2015 -----------------------------------------   Blood pressure 129/91, pulse 96, temperature 98.1 F (36.7 C), temperature source Oral, resp. rate 18, SpO2 100 %.  The patient had no acute events since last update.  Calm and cooperative at this time.  Disposition is pending per Psychiatry/Behavioral Medicine team recommendations.     Gayla DossEryka A Clemencia Helzer, MD 09/17/15 [phone]

## 2015-09-17 NOTE — ED Notes (Signed)
BEHAVIORAL HEALTH ROUNDING Patient sleeping: No. Patient alert and oriented: yes Behavior appropriate: No.; If no, describe: Pt. Did not show aggressive behavior, but uncooperative, when asked to take evening meds or give blood.  Nutrition and fluids offered: Yes  Toileting and hygiene offered: Yes  Sitter present: no Law enforcement present: Yes

## 2015-09-17 NOTE — ED Notes (Signed)

## 2015-09-17 NOTE — ED Notes (Signed)
Four lunch trays removed from room.  Half or more of trays eaten.

## 2015-09-17 NOTE — ED Notes (Signed)
Patient took medications with strong encouragement. Urine sample obtained. Patient continues to exhibit delusional and disorganized thinking. Maintained on 15 minute checks and observation by security camera for safety.

## 2015-09-17 NOTE — ED Notes (Signed)
Pt. Up to use bathroom.  Pt. Took two empty cups of water and filled in bathroom sink and walked back to room with no difficulty.

## 2015-09-17 NOTE — ED Notes (Signed)
Repeated attempts to give pt. Evening medications unsuccessful.

## 2015-09-17 NOTE — ED Notes (Signed)
Patient unwilling to cooperate with repeat blood draw at present. He also has not been able to supply a urine sample. Will continue to encourage patient.

## 2015-09-17 NOTE — ED Notes (Signed)
Talked to pt. In room about giving blood.  Pt. Would not comply.  Repeated attempts to explain to pt. It was important for his health.  Pt. Was friendly during interview, but would not comply with giving blood.  Pt. Would communicate at times, but would turn into word-salad at times.   At times pt. Would stop talking altogether.  Pt. Continued to stay calm during conversation, but would recite bible scripture if the conversation went into giving blood or taking medication.

## 2015-09-17 NOTE — ED Notes (Signed)

## 2015-09-17 NOTE — Progress Notes (Signed)
This Clinical research associatewriter gave encouragement to take medications. Patient refused all HS medications.

## 2015-09-17 NOTE — ED Notes (Signed)
IV[facility], pending placement per Dr Toni Amendlapacs

## 2015-09-18 NOTE — ED Notes (Signed)
Patient continues to exhibit psychotic thinking, religious delusions and paranoia. He will not allow a repeat blood draw or take any of his medications despite strong encouragement by RN.  Appetite improved, taking adequate fluids. Maintained on 15 minute checks and observation by security camera for safety.

## 2015-09-18 NOTE — ED Notes (Signed)
Pt. Up and walked to the front glass looked at clock and went back to room.

## 2015-09-18 NOTE — ED Notes (Signed)
[facility]/ Pending [facility] wait list

## 2015-09-18 NOTE — ED Notes (Signed)
Pt. Noted in room. No complaints or concerns voiced. No distress or abnormal behavior noted. Will continue to monitor with security cameras. Q 15 minute rounds continue. 

## 2015-09-18 NOTE — ED Notes (Signed)
Pt. Up to use bathroom, pt. ambulatory to bathroom with no difficulty.  Pt. Filled to cups of water in sink and took back to room.

## 2015-09-18 NOTE — BH Assessment (Signed)
Received phone call [facility] ([phone]) requesting "Repeat Blood Chemistry." Writer informed patient's nurse (Amy H.).

## 2015-09-18 NOTE — ED Notes (Signed)
Patient resting quietly in room. No noted distress or abnormal behaviors noted. Will continue 15 minute checks and observation by security camera for safety. 

## 2015-09-18 NOTE — BH Assessment (Addendum)
09:27-Spoke with [facility] ([phone]), states patient is not on there Waitlist. Resent sent information via fax.  10:27-Spoke with [facility] (Robinette-[phone]), received information via fax and writer completed the verbal screening again. Was advised by [facility] they will call back if anything else needed.

## 2015-09-18 NOTE — ED Notes (Signed)
Patient continues to refuse all nursing interventions including vital signs. No change in clinical status. Lunch given. Maintained on 15 minute checks and observation by security camera for safety.

## 2015-09-18 NOTE — ED Notes (Signed)
Pt. Up walked to bathroom and got water from bathroom faucet.

## 2015-09-18 NOTE — ED Notes (Signed)
Pt. Alert and oriented, warm and dry, in no distress. Pt. Would not respond to  SI, HI, and [facility]  questions. Pt refused to have blood draw, Pt stating, "God would help." Pt. Encouraged to let nursing staff know of any concerns or needs.

## 2015-09-18 NOTE — ED Notes (Signed)
Patient ate most of dinner. Noted to be taking adequate hydration, mostly water. RN attempted to get patient to comply with blood draw, taking medications, and vital signs. RN also suggested patient take a shower and change into clean scrubs. Patient said "god will do it."  Maintained on 15 minute checks and observation by security camera for safety.

## 2015-09-18 NOTE — ED Notes (Signed)

## 2015-09-18 NOTE — ED Notes (Signed)
gl

## 2015-09-18 NOTE — ED Notes (Signed)
Pt. Walked up to work station to look at clock.  Pt. Walked back to room with no difficulty.

## 2015-09-18 NOTE — ED Notes (Signed)
Patient continues to refuse to allow RN to obtain lab work and vital signs. Still exhibiting delusional thinking. No aggression noted. Maintained on 15 minute checks and observation by security camera for safety.

## 2015-09-18 NOTE — BH Assessment (Signed)
Writer called and informed [facility] [phone]) them the patient continue to refused medications and lab draws. Writer faxed supporting documentation to them.

## 2015-09-18 NOTE — ED Notes (Signed)
Patient refused HS medications stating, "God will help." When this writer asked to get blood for lab, patient continued to say, "God will help he is a great guy." Will continue to monitor.

## 2015-09-18 NOTE — ED Provider Notes (Signed)
-----------------------------------------   5:36 AM on 09/18/2015 -----------------------------------------   BP 124/80 mmHg  Pulse 100  Temp(Src) 98.3 F (36.8 C) (Oral)  Resp 14  SpO2 100%  No acute events since last update.  Disposition is pending per Psychiatry/Behavioral Medicine team recommendations.     Gregory SemenGraydon Eufelia Veno, MD 09/18/15 [phone]

## 2015-09-19 DIAGNOSIS — F203 Undifferentiated schizophrenia: Secondary | ICD-10-CM | POA: Diagnosis not present

## 2015-09-19 DIAGNOSIS — F2089 Other schizophrenia: Secondary | ICD-10-CM | POA: Diagnosis not present

## 2015-09-19 LAB — CBC WITH DIFFERENTIAL/PLATELET
Basophils Absolute: 0 10*3/uL (ref 0–0.1)
Basophils Relative: 1 %
EOS ABS: 0.2 10*3/uL (ref 0–0.7)
Eosinophils Relative: 6 %
HEMATOCRIT: 35.1 % — AB (ref 40.0–52.0)
HEMOGLOBIN: 11.6 g/dL — AB (ref 13.0–18.0)
LYMPHS ABS: 1.2 10*3/uL (ref 1.0–3.6)
Lymphocytes Relative: 37 %
MCH: 26.1 pg (ref 26.0–34.0)
MCHC: 33 g/dL (ref 32.0–36.0)
MCV: 79.2 fL — ABNORMAL LOW (ref 80.0–100.0)
MONO ABS: 0.3 10*3/uL (ref 0.2–1.0)
MONOS PCT: 9 %
NEUTROS PCT: 47 %
Neutro Abs: 1.6 10*3/uL (ref 1.4–6.5)
Platelets: 183 10*3/uL (ref 150–440)
RBC: 4.43 MIL/uL (ref 4.40–5.90)
RDW: 13.3 % (ref 11.5–14.5)
WBC: 3.4 10*3/uL — ABNORMAL LOW (ref 3.8–10.6)

## 2015-09-19 LAB — COMPREHENSIVE METABOLIC PANEL
ALK PHOS: 38 U/L (ref 38–126)
ALT: 10 U/L — ABNORMAL LOW (ref 17–63)
ANION GAP: 3 — AB (ref 5–15)
AST: 20 U/L (ref 15–41)
Albumin: 3.7 g/dL (ref 3.5–5.0)
BILIRUBIN TOTAL: 0.4 mg/dL (ref 0.3–1.2)
BUN: 15 mg/dL (ref 6–20)
CALCIUM: 8.5 mg/dL — AB (ref 8.9–10.3)
CO2: 30 mmol/L (ref 22–32)
Chloride: 105 mmol/L (ref 101–111)
Creatinine, Ser: 0.93 mg/dL (ref 0.61–1.24)
GFR calc non Af Amer: >60 mL/min (ref >60)
Glucose, Bld: 100 mg/dL — ABNORMAL HIGH (ref 65–99)
POTASSIUM: 3.8 mmol/L (ref 3.5–5.1)
SODIUM: 138 mmol/L (ref 135–145)
TOTAL PROTEIN: 6.3 g/dL — AB (ref 6.5–8.1)

## 2015-09-19 NOTE — ED Notes (Signed)
Pt eating breakfast. RN encouraged pt to take ordered mediations. Pt stated, "God will help. He is an interesting man." Pt repeated this many times. Pt was asked by an RN if she could take his pulse manually. Pt stated, "God will help. He is an interesting man. I'll watch. I'll watch. I'll let you know."  RN did not attempt to take pulse. Maintained on 15 minute checks and observation by security camera for safety.

## 2015-09-19 NOTE — Progress Notes (Signed)
LCSW met with patient to assess to see if he would cooperate with the nurses to allow them to get vitals. It was explained that nurses are kind and do "Gods work". In brief discussion patient stated he wants his check that his mother has arranged for him 740.00 Patient was unable to hold his thoughts, would have disconnected/fragmented sentences ( word salad) and would pull away when being touched. The nurse was able to get a pulse. Patient then explained he would allow [place] to come back at 4pm. He has a bad odors and has not showered since yesterday. LCSW will attempt later to engage with patient.   [employer] LCSW [phone]

## 2015-09-19 NOTE — Progress Notes (Signed)
LCSW called [facility] and Barb and Junious DresserConnie have left. Left message with other admissions coordinator and hope to hear back by 6pm. It was confirmed the faxed labs were received and forwarded onto to Med staff.  LCSW provided numbers to be reached.  Malek Skog LCSW

## 2015-09-19 NOTE — ED Notes (Signed)
Pt sitting on side of the bed finishing his dinner. No distress noted. Maintained on 15 minute checks and observation by security camera for safety.

## 2015-09-19 NOTE — ED Notes (Signed)
Pt. Noted in room. No complaints or concerns voiced. No distress or abnormal behavior noted. Will continue to monitor with security cameras. Q 15 minute rounds continue. 

## 2015-09-19 NOTE — ED Notes (Signed)
Pt spoke with psychiatrist. Maintained on 15 minute checks and observation by security camera for safety.

## 2015-09-19 NOTE — ED Notes (Addendum)
RN spoke with [name], a social worker very  familiar with pt's case. [name] wanted an update on his case. RN informed social worker the plan is for the pt to [facility], pending acceptance. [name] also stated his current group home will not take him back unless he is stabilized on his psychiatric medications. [name] plans to visit this afternoon.

## 2015-09-19 NOTE — ED Notes (Signed)
Pt refused to let staff change his linen.

## 2015-09-19 NOTE — ED Notes (Signed)
Pt refused to take a shower when asked by security. Pt stated he must talk to his teachers first. Maintained on 15 minute checks and observation by security camera for safety.

## 2015-09-19 NOTE — Progress Notes (Signed)
LCSW [facility]ed [facility] to [facility] lab results and nurses case notes and it will be [facility] staff this afternoon to review. LCSW was informed this patient remains on a lengthy wait list despite our efforts and they will contact us when a bed comes available. ( It will be days ) [facility] nurses notified.  Mali Eppard LCSW

## 2015-09-19 NOTE — ED Notes (Addendum)
Pt watching tv in room. No concerns voiced. No distress noted. Maintained on 15 minute checks and observation by security camera for safety.

## 2015-09-19 NOTE — Consult Note (Signed)
  Psychiatry: Follow-up consult for 60 year old man with schizophrenia. As noted previously we have not admitted him to the psychiatry ward because of his poor outcomes from inpatient treatment on our unit in the past and his likely need of longer term treatment. Patient interviewed today. He did not say anything coherent or lucid. He continues to be hyper religious and disorganized. Although he is eating he is refusing all of his medicine. He has also refused to allow any blood test to be done despite multiple efforts to explain the situation and multiple providers approaching him. His reasons are incoherent.  Disheveled gentleman. Awake and alert. Very psychotic. Extremely disorganized thinking. Delusional and hyperreligious.  [facility] want to have a CBC and another chemistry panel especially covering his renal function drawn. I am going to order these but I suspect we will need to restrain him to have them done.  Continue orders for antipsychotic medicines as they are.

## 2015-09-19 NOTE — ED Notes (Signed)
LCSW spoke with patient and encouraged pt to cooperate with treatment plan. RN was able to obtain a radial pulse of 72. Respiratory rate was 20. Pt would not allow a manual BP to be taken. Multiple attempts by nurses, social work and security were made to encourage compliance. Pt remains calm, but refuses.   Patient is having religious delusions. Speech is repetitive and disorganized. Pt statements are centered around God and God's work.  Maintained on 15 minute checks and observation by security camera for safety.

## 2015-09-19 NOTE — ED Notes (Signed)
Patient resting quietly in room. No noted distress or abnormal behaviors noted. Will continue 15 minute checks and observation by security camera for safety. 

## 2015-09-19 NOTE — ED Notes (Signed)
Pt refused 0800 medications. Pt stated, "God will help. He is an interesting man."

## 2015-09-19 NOTE — Progress Notes (Signed)
LCSW called [facility] and spoke with admissions coordinator Lesle Reek( Barb) She stated [facility] would not accept patient unless labs were drawn and suggested we restrain him to get lab work and vitals. LCSW advocated on  Patients behalf and explained that due to his acuity and delusional state were are unable to obtain the labs. Lesle ReekBarb reported she will contact her medical staff to make inquiries. LCSW will await a call back. Delta Air LinesClaudine Kalanie Fewell LCSW [phone]

## 2015-09-19 NOTE — ED Notes (Signed)
Pt allowed EVS to mop the floor in his room. Pt continues to refuse medications, vital signs and labs. Pt often states "God will fix it." No distress noted. Maintained on 15 minute checks and observation by security camera for safety.

## 2015-09-19 NOTE — ED Notes (Signed)
Pt standing in room, holding up blanket in front of him. No distress noted. Maintained on 15 minute checks and observation by security camera for safety.

## 2015-09-19 NOTE — ED Notes (Addendum)
2 RN's and security attempted to convince pt to allow VS to be taken. Pt kept saying "God will fix it." Pt's speech is word salad and religious delusions. Pt did not become aggressive, but did not allow staff to touch him. Pt also refused medications. Dinner tray served. No distress noted. Maintained on 15 minute checks and observation by security camera for safety.

## 2015-09-19 NOTE — ED Provider Notes (Signed)
-----------------------------------------   7:02 AM on 09/19/2015 -----------------------------------------   Blood pressure 124/80, pulse 100, temperature 98.3 F (36.8 C), temperature source Oral, resp. rate 14, SpO2 100 %.  The patient had no acute events since last update.  Calm and cooperative at this time.  Disposition is pending per Psychiatry/Behavioral Medicine team recommendations.   I will reorder BMP to eval for sodium level today.  Sharyn CreamerMark Quale, MD 09/19/15 [phone]

## 2015-09-19 NOTE — ED Notes (Signed)
Patient sitting on side of bed. No noted distress or abnormal behaviors noted. Will continue 15 minute checks and observation by security camera for safety.

## 2015-09-19 NOTE — ED Notes (Signed)
Pt currently getting a drink of water in the bathroom. Pt is calm but still refuses medications and lab work. No distress noted. Maintained on 15 minute checks and observation by security camera for safety.

## 2015-09-20 DIAGNOSIS — F2089 Other schizophrenia: Secondary | ICD-10-CM | POA: Diagnosis not present

## 2015-09-20 DIAGNOSIS — F203 Undifferentiated schizophrenia: Secondary | ICD-10-CM | POA: Diagnosis not present

## 2015-09-20 MED ORDER — OLANZAPINE 10 MG PO TABS
ORAL_TABLET | ORAL | Status: AC
  Administered 2015-09-20: 20 mg via ORAL
  Filled 2015-09-20: qty 2

## 2015-09-20 MED ORDER — OLANZAPINE 10 MG PO TABS
20.0000 mg | ORAL_TABLET | Freq: Two times a day (BID) | ORAL | Status: DC
  Administered 2015-09-20 – 2015-09-24 (×5): 20 mg via ORAL
  Filled 2015-09-20 (×3): qty 2

## 2015-09-20 NOTE — ED Notes (Signed)
Pt is awake in bed watching TV this evening. Pt mood is depressed and his affect is blunted. Pt forwards little and is refusing medications tonight, stating "God will help when asked to take them." Pt seems to be responding to internal stimuli but is cooperative with staff and in no distress. Writer provided food and drink and 15 minute checks are ongoing for safety.

## 2015-09-20 NOTE — ED Notes (Signed)
Pt. Noted in room. No complaints or concerns voiced. No distress or abnormal behavior noted. Will continue to monitor with security cameras. Q 15 minute rounds continue.,laying down

## 2015-09-20 NOTE — ED Notes (Signed)
Pt appears to be asleep and resps easy.

## 2015-09-20 NOTE — Consult Note (Signed)
  Psychiatry: Follow-up for 60 year old man with history of schizophrenia. For whatever reason of his own, the patient decided to comply with oral medication this morning and did agree to take 20 mg of Zyprexa. Also it appears that he has taken a shower and may be making a little more effort to clean himself up. He remains hyper religious and confused in the interview. He has not been aggressive or violent here in the hospital.  We did get some labs drawn yesterday. Labs reviewed. Sodium has returned to within the normal range. Nothing else remarkable or indicating any need for change of treatment in his labs. Some abnormalities likely chronic and related to poor nutrition.  [facility] that no bed is available and it is unlikely that onewill be available in less than a week. We will continue to consider the option of possibly referring him back to his group home if he starts to be consistently compliant with medicine. Case reviewed with nursing and [name].

## 2015-09-20 NOTE — ED Notes (Signed)
Pt does seem to be slightly better since he slept and took his meds, he has stopped mentioning that God would heal him and god will know and God would help  etc he has talked about basketball and his check he states he will get his bp when he takes his meds before bed, he has ate well all day,watched tv when awake and was writing.

## 2015-09-20 NOTE — ED Notes (Signed)
Pt still refusing vs today but we got vs last eve and he did shower last night,and allowed clean linens , ate well for breakfast and taking adequate fluids

## 2015-09-20 NOTE — ED Notes (Signed)
Pt is awake in bed watching TV this evening. Pt mood is sad and his affect is blunted. Pt speech is tangential and he forwards little to staff. Writer encouraged pt to shower and offered toiletries and clean clothes. Pt is also refusing medications stating instead that "God will help." Snack provided and 15 minute checks ongoing for safety.

## 2015-09-20 NOTE — Progress Notes (Signed)
L[facility] and called and spoke to Bivalveonnie. The results will be [facility]  this morning ( NO Beds or known discharges today).  Kierre Deines LCSW

## 2015-09-20 NOTE — ED Provider Notes (Signed)
-----------------------------------------   7:06 AM on 09/20/2015 -----------------------------------------   Blood pressure 158/90, pulse 100, temperature 98.3 F (36.8 C), temperature source Oral, resp. rate 20, SpO2 100 %.  The patient had no acute events since last update.  Calm and cooperative at this time.   [facility] and is waiting to be assessed.     Rebecka ApleyAllison P Glenice Ciccone, MD 09/20/15 [phone]

## 2015-09-20 NOTE — Progress Notes (Signed)
LCSW called and left message for [facility] [employer] and left a message for care provider to call this worker back.   LCSW called Group home number on face sheet and its INCORRECT please call [phone]. Called Gregory LocketJanice [name] of [facility], she was informed by guardian [name] [facility] in another Group Home [place] the [name]. [name] will call guardian tomorow to see if this has been arranged. If not this [facility]  will take patient [facility] once stabilized. LCSW called [name] and left message awaiting call back.  Delta Air LinesClaudine Waverley Krempasky LCSW [phone]

## 2015-09-20 NOTE — ED Notes (Signed)
Pt decided to shower this AM. Writer provided toiletries and changed bed linens as well.

## 2015-09-20 NOTE — ED Notes (Signed)
[facility]/Pending placement 

## 2015-09-21 DIAGNOSIS — F203 Undifferentiated schizophrenia: Secondary | ICD-10-CM | POA: Diagnosis not present

## 2015-09-21 DIAGNOSIS — F2089 Other schizophrenia: Secondary | ICD-10-CM | POA: Diagnosis not present

## 2015-09-21 NOTE — ED Notes (Signed)
Patient agreed to take medications. At patient request, they were crushed and given in apple sauce. Patient was offered a shower but refused.  He ate his breakfast and took adequate fluids. Maintained on 15 minute checks and observation by security camera for safety.

## 2015-09-21 NOTE — ED Notes (Signed)
  Patient refused 0800 medications. He was holding up a blanket as a barrier and was mumbling to himself. He would not make eye contact with RN.

## 2015-09-21 NOTE — ED Notes (Signed)
Supper and milk given to pt.

## 2015-09-21 NOTE — ED Notes (Signed)
Patient quietly resting in room. Appears preoccupied. Would not consent to take medications.Maintained on 15 minute checks and observation by security camera for safety.

## 2015-09-21 NOTE — ED Notes (Signed)

## 2015-09-21 NOTE — ED Notes (Signed)
Patient resting quietly in room. No noted distress or abnormal behaviors noted. Will continue 15 minute checks and observation by security camera for safety. 

## 2015-09-21 NOTE — Progress Notes (Signed)
[name] has spoke with Gregory LocketJanice [name] of [facility] @ [phone]. The group home representative stated that she is not looking forward to receiving the pt back at the group home. [name] has stated that she would rather coordinate care through the pts guardian and suggested that that this writer contact the pts DSS guardian Gregory RiveraGregory Flank).   [name] did speak with the pts guardian. [name] has shared that she has been under the impression that the pt would be accepted and [facility]. [name] informed the pts guardian that the most recent updates states that the pt has been placed on an extensive waiting list with an anticipated wait of several weeks. Gregory. Flank has shared that she has yet to work towards any placement options for the pt. She has informed [name] that it is likely that the group home will require a site visit prior to considering readmitting the pt. Pts guardian has stated that she will contact the group home in regards to the likelihood of this occuring.   09/21/2015  Gregory FlashNicole Rosmarie Esquibel, MS, NCC, LPCA Therapeutic Triage Specialist

## 2015-09-21 NOTE — Consult Note (Signed)
[facility] Consult   Reason for Consult:  Consult for 60 year old man with schizophrenia and comes into the hospital off his medicine Referring Physician:  Corky Downs Patient Identification: [name] MRN:  [id number] Principal Diagnosis: Schizophrenia [facility]) Diagnosis:   Patient Active Problem List   Diagnosis Date Noted  . Undifferentiated schizophrenia ([place]) [F20.3]   . Schizophrenia ([place]) [F20.9] 09/16/2015  . Dehydration [E86.0] 09/16/2015  . Hypernatremia [E87.0] 09/16/2015  . Diabetes ([place]) [E11.9] 09/16/2015  . Hypothyroid [E03.9] 09/16/2015  . Noncompliance [Z91.19] 09/16/2015  . Agitation [R45.1] 09/16/2015    Total Time spent with patient: 1 hour  Subjective:   [name] is a 60 y.o. male patient admitted with "I hope, I hope".  Updated evaluation as of Thursday the 16th. Typical of his usual behavior Mr. Kuyper has refused medicine for a few days and then started taking some of his psychiatric medicine again. For the last day and a half he has been taking his psychiatric medicine pretty regularly. His mental status stays about the same although he did take a shower and clean himself up a little bit. Still disorganized and hyperreligious. Still not violent or threatening. Patient is probably returning pretty much to his baseline and has been eating and drinking normally.  HPI:  Patient interviewed although he is not a very helpful historian. Chart reviewed including multiple old notes. Referral paperwork reviewed. Case discussed with hospitalist and emergency room physician and [name]. Reviewed his labs. 60 year old man with a long-standing history of schizophrenia who is well known to our hospital as well who was sent here from his group home. They report for several days at least he has been off of his medicine. Appears to be cheeking it. He has gotten more psychotic and confused and uncommunicative. He has been spending his time in his room isolating himself. Has  stopped eating or at least cut down to no more than one meal a day. Refuses to change his clothes or cooperate or wash. This is all pretty typical behavior for this patient who has a history of noncompliance with his schizophrenia. On presentation to the emergency room labs were done which showed a sodium level of 151 and an elevated BUN and creatinine. Almost certainly caused by dehydration from not eating and drinking. Since being in the ER he has been eating and drinking. However he continues to be noncompliant with treatment. He has refused to allow a second blood draw. He has not taken oral medicines yet. He has had to be spread strained for forcing IM medicines. Patient is very confused and paranoid and unable to hold a rational conversation.  Social history: Patient lives in a group home. Schizophrenia going back probably almost 40 years. Unclear if any family are still in [place] but it doesn't look like they are I'm not sure who his guardian is.  Medical history: Patient has diabetes, history of hypothyroidism current hypernatremia.  Substance abuse history: Patient does not typically abuse substances there is no history of it and no indication of a current problem.  Past Psychiatric History: Long-standing history of schizophrenia. He frequently is noncompliant with his medicines. Always refuses long-acting injectables. Has had multiple hospitalizations as a result. Looking at our hospital notes going back at least 2013 it has been the general understanding of the hospital that admitting him to our inpatient ward is unproductive because of his noncompliance and refusal to cooperate with treatment. We have instead preferred to try to refer him to [facility]. At  some times he has needed to be referred at other times he eventually takes his medicine. Patient does not have a history of suicide attempts he does have a history of agitation mainly when medicine and treatment is forced on  him.  Risk to Self: Suicidal Ideation: No Suicidal Intent: No Is patient at risk for suicide?: No Suicidal Plan?: No Access to Means: No What has been your use of drugs/alcohol within the last 12 months?: None Reported or noted How many times?: 0 Other Self Harm Risks: None Reported or noted Triggers for Past Attempts: None known Intentional Self Injurious Behavior: None Risk to Others: Homicidal Ideation: No Thoughts of Harm to Others: No Current Homicidal Intent: No Current Homicidal Plan: No Access to Homicidal Means: No Identified Victim: None Reported History of harm to others?: No Assessment of Violence: On admission Violent Behavior Description: When taking blood work during admission process. Does patient have access to weapons?: No Criminal Charges Pending?: No Does patient have a court date: No Prior Inpatient Therapy: Prior Inpatient Therapy: Yes Prior Therapy Dates: Unknown Prior Therapy Facilty/Provider(s): [facility] Reason for Treatment: Schizophrenia Prior Outpatient Therapy: Prior Outpatient Therapy: Yes Prior Therapy Dates: 2016 Prior Therapy Facilty/Provider(s): [facility] Reason for Treatment: Schizophrenia Does patient have an ACCT team?: No Does patient have Intensive In-House Services?  : No Does patient have [facility] services? : No Does patient have P4CC services?: No  Past Medical History:  Past Medical History  Diagnosis Date  . Schizophrenia ([place])   . Diabetes mellitus without complication ([place])   . Thyroid disease   . Hyperlipemia    No past surgical history on file. Family History: No family history on file. Family Psychiatric  History: Patient is unable to give any history about that. Social History:  History  Alcohol Use: Not on file     History  Drug Use Not on file    Social History   Social History  . Marital Status: Single    Spouse Name: N/A  . Number of Children: N/A  . Years of Education: N/A   Social History Main Topics  .  Smoking status: None  . Smokeless tobacco: None  . Alcohol Use: None  . Drug Use: None  . Sexual Activity: Not Asked   Other Topics Concern  . None   Social History Narrative  . None   Additional Social History:    Allergies:  No Known Allergies  Labs:  Results for orders placed or performed during the hospital encounter of 09/15/15 (from the past 48 hour(s))  CBC with Differential/Platelet     Status: Abnormal   Collection Time: 09/19/15  6:56 PM  Result Value Ref Range   WBC 3.4 (L) 3.8 - 10.6 K/uL   RBC 4.43 4.40 - 5.90 MIL/uL   Hemoglobin 11.6 (L) 13.0 - 18.0 g/dL   HCT 35.1 (L) 40.0 - 52.0 %   MCV 79.2 (L) 80.0 - 100.0 fL   MCH 26.1 26.0 - 34.0 pg   MCHC 33.0 32.0 - 36.0 g/dL   RDW 13.3 11.5 - 14.5 %   Platelets 183 150 - 440 K/uL   Neutrophils Relative % 47 %   Neutro Abs 1.6 1.4 - 6.5 K/uL   Lymphocytes Relative 37 %   Lymphs Abs 1.2 1.0 - 3.6 K/uL   Monocytes Relative 9 %   Monocytes Absolute 0.3 0.2 - 1.0 K/uL   Eosinophils Relative 6 %   Eosinophils Absolute 0.2 0 - 0.7 K/uL   Basophils  Relative 1 %   Basophils Absolute 0.0 0 - 0.1 K/uL  Comprehensive metabolic panel     Status: Abnormal   Collection Time: 09/19/15  6:56 PM  Result Value Ref Range   Sodium 138 135 - 145 mmol/L    Comment: RESULTS VERIFIED BY REPEAT TESTING   Potassium 3.8 3.5 - 5.1 mmol/L   Chloride 105 101 - 111 mmol/L   CO2 30 22 - 32 mmol/L   Glucose, Bld 100 (H) 65 - 99 mg/dL   BUN 15 6 - 20 mg/dL   Creatinine, Ser 0.93 0.61 - 1.24 mg/dL   Calcium 8.5 (L) 8.9 - 10.3 mg/dL   Total Protein 6.3 (L) 6.5 - 8.1 g/dL   Albumin 3.7 3.5 - 5.0 g/dL   AST 20 15 - 41 U/L   ALT 10 (L) 17 - 63 U/L   Alkaline Phosphatase 38 38 - 126 U/L   Total Bilirubin 0.4 0.3 - 1.2 mg/dL   GFR calc non Af Amer >60 >60 mL/min   GFR calc Af Amer >60 >60 mL/min    Comment: (NOTE) The eGFR has been calculated using the CKD EPI equation. This calculation has not been validated in all clinical  situations. eGFR's persistently <60 mL/min signify possible Chronic Kidney Disease.    Anion gap 3 (L) 5 - 15    Current Facility-Administered Medications  Medication Dose Route Frequency Provider Last Rate Last Dose  . carbamazepine (TEGRETOL) tablet 200 mg  200 mg Oral BID AC Gonzella Lex, MD   200 mg at 09/21/15 0903  . clonazePAM (KLONOPIN) disintegrating tablet 0.5 mg  0.5 mg Oral BID Gonzella Lex, MD   0.5 mg at 09/21/15 0903  . levothyroxine (SYNTHROID, LEVOTHROID) tablet 75 mcg  75 mcg Oral QAC breakfast Gonzella Lex, MD   75 mcg at 09/21/15 0903  . metFORMIN (GLUCOPHAGE) tablet 1,000 mg  1,000 mg Oral BID WC Gonzella Lex, MD   1,000 mg at 09/21/15 0903  . OLANZapine (ZYPREXA) tablet 20 mg  20 mg Oral BID Gonzella Lex, MD   20 mg at 09/21/15 0904  . OLANZapine zydis (ZYPREXA) disintegrating tablet 20 mg  20 mg Oral Once Gonzella Lex, MD   Stopped at 09/16/15 1419  . simvastatin (ZOCOR) tablet 20 mg  20 mg Oral q1800 Gonzella Lex, MD   20 mg at 09/17/15 1721  . traZODone (DESYREL) tablet 100 mg  100 mg Oral QHS Gonzella Lex, MD   100 mg at 09/16/15 2126   Current Outpatient Prescriptions  Medication Sig Dispense Refill  . aspirin EC 81 MG tablet Take 81 mg by mouth daily.    . benztropine (COGENTIN) 1 MG tablet Take 1 mg by mouth 2 (two) times daily.    . clonazePAM (KLONOPIN) 0.5 MG tablet Take 0.5 mg by mouth 2 (two) times daily.    Marland Kitchen docusate sodium (COLACE) 100 MG capsule Take 100 mg by mouth daily as needed for mild constipation.    . folic acid (FOLVITE) 1 MG tablet Take 1 mg by mouth daily.    Marland Kitchen levothyroxine (SYNTHROID, LEVOTHROID) 125 MCG tablet Take 62.5 mcg by mouth daily before breakfast.    . lisinopril (PRINIVIL,ZESTRIL) 20 MG tablet Take 20 mg by mouth daily.    . metFORMIN (GLUCOPHAGE) 1000 MG tablet Take 1,000 mg by mouth 2 (two) times daily with a meal.    . OLANZapine (ZYPREXA) 20 MG tablet Take 20 mg by mouth 2 (  two) times daily.    .  risperiDONE (RISPERDAL) 3 MG tablet Take 3 mg by mouth 2 (two) times daily.    . simvastatin (ZOCOR) 10 MG tablet Take 10 mg by mouth daily.    . vitamin B-12 (CYANOCOBALAMIN) 1000 MCG tablet Take 1,000 mcg by mouth daily.      Musculoskeletal: Strength & Muscle Tone: within normal limits Gait & Station: normal Patient leans: N/A  Psychiatric Specialty Exam: Review of Systems  Unable to perform ROS: psychiatric disorder    Blood pressure 158/90, pulse 100, temperature 98.3 F (36.8 C), temperature source Oral, resp. rate 20, SpO2 100 %.There is no height or weight on file to calculate BMI.  General Appearance: Disheveled  Eye Contact::  Minimal  Speech:  Garbled and Slow  Volume:  Decreased  Mood:  Euthymic  Affect:  Constricted  Thought Process:  Disorganized and Loose  Orientation:  Full (Time, Place, and Person)  Thought Content:  Delusions, Paranoid Ideation and Very hyper religious  Suicidal Thoughts:  No  Homicidal Thoughts:  No  Memory:  Negative  Judgement:  Poor  Insight:  Shallow  Psychomotor Activity:  Decreased  Concentration:  Poor  Recall:  Poor  Fund of Knowledge:Poor  Language: Poor  Akathisia:  No  Handed:  Right  AIMS (if indicated):     Assets:  Financial Resources/Insurance Housing  ADL's:  Impaired  Cognition: Impaired,  Mild  Sleep:      Treatment Plan Summary: Medication management and Plan Patient has noted his probably pretty much returning to his baseline. Not likely to need psychiatric inpatient treatment. We had referred him to [facility] and have been told that if a bed were to become available it is unknown how long it would take it could be weeks. Meanwhile we have considered the option of sending him back to his group home. There seems to be conflict or some kind of mixed understanding between his guardian and the group home as to whether he will be able to come back and what the appropriate placement is. Guardian does not  seem to be actively trying to find new placement. I believe that we are going to have a reevaluation by the group home within the next day or so. Meanwhile no change to current medication daily supportive therapy. Daily monitoring of his blood pressure when he will allow it.  Disposition: See note above.  Alethia Berthold, MD 09/21/2015 6:04 PM

## 2015-09-21 NOTE — ED Notes (Signed)
Pt. Is currently in his room awake; watching the tv. Pt. Remains to not speak; remains on 15 minute monitoring

## 2015-09-21 NOTE — ED Notes (Signed)
Patient refusing vital signs at present.

## 2015-09-21 NOTE — ED Notes (Signed)
Patient asleep in room. No noted distress or abnormal behavior. Will continue 15 minute checks and observation by security cameras for safety. 

## 2015-09-21 NOTE — ED Notes (Signed)
Patient still exhibiting delusional thinking but is able to respond to simple directions. Lunch given. Maintained on 15 minute checks and observation by security camera for safety.

## 2015-09-21 NOTE — ED Notes (Addendum)
Attempted to give pt 1700 meds. Pt completely ignored requests to scan id bracelet, take meds, or acknowledge my presence in any way. Will have BHU rn retry on return to unit.

## 2015-09-21 NOTE — ED Provider Notes (Signed)
-----------------------------------------   7:30 AM on 09/21/2015 -----------------------------------------   Blood pressure 158/90, pulse 100, temperature 98.3 F (36.8 C), temperature source Oral, resp. rate 20, SpO2 100 %.  The patient had no acute events since last update.  Calm and cooperative at this time.  Disposition is pending per Psychiatry/Behavioral Medicine team recommendations.     Jennye MoccasinBrian S Joshia Kitchings, MD 09/21/15 0730

## 2015-09-21 NOTE — ED Notes (Signed)
Report was received from Gregory IsaacsAmy H., RN. Pt. Is in no distress; no complaints; Pt. Is non-verbal; denies having any S.I. Or H.I.

## 2015-09-21 NOTE — ED Notes (Signed)
Snack and beverage was offered; pt. Refused.

## 2015-09-22 DIAGNOSIS — F203 Undifferentiated schizophrenia: Secondary | ICD-10-CM | POA: Diagnosis not present

## 2015-09-22 NOTE — ED Notes (Signed)
Report was received Reather ConverseKarena T., RN; Pt. Verbalizes no complaints or distress; denies S.I./Hi; Pt. Has continued to refuse meds; and has been non-verbal; Continue to monitor with 15 min. Monitoring.

## 2015-09-22 NOTE — ED Notes (Signed)
Patient resting quietly in room. No noted distress or abnormal behaviors noted. Will continue 15 minute checks and observation by security camera for safety. 

## 2015-09-22 NOTE — BH Assessment (Signed)
Writer called and confirmed with [facility] ([phone]) that patient is on their Waitlist. Nothing else needed at this time.

## 2015-09-22 NOTE — ED Notes (Signed)
Patient asleep in room. No noted distress or abnormal behavior. Will continue 15 minute checks and observation by security cameras for safety. 

## 2015-09-22 NOTE — ED Notes (Signed)
BEHAVIORAL HEALTH ROUNDING Patient sleeping: Yes.   Patient alert and oriented: yes Behavior appropriate: Yes.  ; If no, describe:  Nutrition and fluids offered: No Toileting and hygiene offered: No Sitter present: no Law enforcement present: No

## 2015-09-22 NOTE — ED Notes (Signed)
ENVIRONMENTAL ASSESSMENT Potentially harmful objects out of patient reach: Yes Personal belongings secured: Yes Patient dressed in hospital provided attire only: Yes Plastic bags out of patient reach: Yes Patient care equipment (cords, cables, call bells, lines, and drains) shortened, removed, or accounted for: Yes Equipment and supplies removed from bottom of stretcher: Yes Potentially toxic materials out of patient reach: Yes Sharps container removed or out of patient reach: Yes  Patient currently in room resting. Patient received breakfast tray. No signs of distress noted. Maintained on 15 minute checks and observation by security camera for safety.

## 2015-09-22 NOTE — ED Notes (Signed)
Offered patient medication again. Patient stared blankly at the wall, did not respond to commands, and did not make eye contact. Will continue to monitor the patient Q15 for safety .

## 2015-09-22 NOTE — ED Notes (Signed)
Patient resting quietly in room. No noted distress. Patient refused medications and vital signs. Will continue 15 minute checks and observation by security camera for safety.

## 2015-09-22 NOTE — Consult Note (Signed)
  Psychiatry: Follow-up for 60 year old man with schizophrenia. Patient is still in the emergency room because of lack of appropriate disposition. Decision had been made because of his past history that he would not benefit from inpatient treatment on the psychiatry unit downstairs. [facility] which [facility] more appropriately serve his needs does not have a bed available. Attempts to refer him to other facilities have been unsuccessful. We had considered having him return to his group home as the patient is not violent or actively trying to hurt himself but there are roadblocks coming up. They appear at this point to not be willing to have him come back. Unclear what his guardian is doing as far as placement.  Attempted to speak with the patient today. He is in his room awake with his eyes open but would not make eye contact. Did not speak did not answer any questions. He seems to be eating and drinking minimally at this point. I'm very concerned about him getting dehydrated again. I tried to inform him of all of this and encouraged him for his health to make sure he is eating and drinking out no idea if he was listening to me.  Patient was not violent not agitated not threatening but clearly not taking care of himself. Poor hygiene.  We continue with oral medication as ordered. If he continues with this and looks like he is at risk of getting dehydrated again we may have to start forcing intramuscular injections. Patient was strongly encouraged to please take his medicine which she sometimes does. Follow up over the weekend. I will see him again on Monday. He will be signed out to the psychiatrist on call.

## 2015-09-22 NOTE — ED Provider Notes (Signed)
Filed Vitals:   09/19/15 0835 09/19/15 1904  BP:  158/90  Pulse: 72 100  Temp:    Resp: 20    Patient is refusing to take his medications, remains medically stable for psychiatric disposition.   Emily FilbertJonathan E Williams, MD 09/22/15 [phone]

## 2015-09-22 NOTE — ED Notes (Signed)
Attempted to give patient medicine for the morning. Patient laid in bed, did not respond to verbal commands, did not make eye contact with nurse. Will attempt to administer medications again in 15 minutes.

## 2015-09-22 NOTE — ED Notes (Signed)
BEHAVIORAL HEALTH ROUNDING Patient sleeping: Yes.   Patient alert and oriented: yes Behavior appropriate: Yes.  ; If no, describe:  Nutrition and fluids offered: Yes  Toileting and hygiene offered: Yes  Sitter present: no Law enforcement present: No(ODS/security)

## 2015-09-22 NOTE — ED Notes (Signed)
Patient currently in room resting. Patient has no complaints at this time. Patient has been able to have a lucid and clear conversation with staff, but then began to act bizarrely holding up a sheet in his room to block out the voices. Will continue to monitor Q15 for safety.

## 2015-09-22 NOTE — ED Notes (Signed)
Patient in room laying in bed staring at ceiling. No signs of distress noted. Maintained on 15 minute checks and observation by security camera for safety.

## 2015-09-22 NOTE — ED Notes (Signed)
IVC papers renewed by Dr. Toni Amendclapacs  Pending placement

## 2015-09-23 DIAGNOSIS — F209 Schizophrenia, unspecified: Secondary | ICD-10-CM | POA: Diagnosis not present

## 2015-09-23 DIAGNOSIS — F2089 Other schizophrenia: Secondary | ICD-10-CM | POA: Diagnosis not present

## 2015-09-23 MED ORDER — METFORMIN HCL 500 MG PO TABS
ORAL_TABLET | ORAL | Status: AC
Start: 2015-09-23 — End: 2015-09-24
  Administered 2015-09-24: 1000 mg via ORAL
  Filled 2015-09-23: qty 2

## 2015-09-23 MED ORDER — OLANZAPINE 10 MG PO TABS
ORAL_TABLET | ORAL | Status: AC
  Filled 2015-09-23: qty 2

## 2015-09-23 MED ORDER — CARBAMAZEPINE 200 MG PO TABS
ORAL_TABLET | ORAL | Status: AC
  Filled 2015-09-23: qty 1

## 2015-09-23 MED ORDER — SIMVASTATIN 40 MG PO TABS
ORAL_TABLET | ORAL | Status: AC
  Filled 2015-09-23: qty 1

## 2015-09-23 MED ORDER — CLONAZEPAM 0.5 MG PO TABS
ORAL_TABLET | ORAL | Status: AC
  Filled 2015-09-23: qty 1

## 2015-09-23 NOTE — ED Notes (Signed)
Pt resting with eyes closed in bed. No distress noted. Maintained on 15 minute checks and observation by security camera for safety.

## 2015-09-23 NOTE — ED Notes (Signed)
Pt resting in bed. Continues to check time at nurses station regularly.  No concerns voiced. No distress noted. Maintained on 15 minute checks and observation by security camera for safety.

## 2015-09-23 NOTE — ED Notes (Signed)
Pt resting in bed quietly. Pt has eaten lunch. No distress noted. Maintained on 15 minute checks and observation by security camera for safety.

## 2015-09-23 NOTE — ED Notes (Signed)
Pt given dinner tray. Pt resting in bed. No concerns voiced. No distress noted. Maintained on 15 minute checks and observation by security camera for safety.

## 2015-09-23 NOTE — ED Notes (Signed)
Second RN attempted to get pt's vital signs. Pt again stated, "God will do it." Maintained on 15 minute checks and observation by security camera for safety.

## 2015-09-23 NOTE — ED Notes (Signed)
Pt speaking to psychiatrist. Pt  is calm. No distress noted. Maintained on 15 minute checks and observation by security camera for safety.

## 2015-09-23 NOTE — ED Notes (Signed)
Pt laying in bed quietly. No distress noted. Maintained on 15 minute checks and observation by security camera for safety.

## 2015-09-23 NOTE — ED Notes (Signed)
Report was received Amy B., RN; Pt. Verbalizes no complaints or distress; denies S.I./Hi. Continue to monitor with 15 min. Monitoring.

## 2015-09-23 NOTE — Consult Note (Addendum)
[facility] Psych ED Progress Note  09/23/2015 12:02 PM [name]  MRN:  [id number] Subjective:  Patient continues to be disorganized. He is answer to my questions were irrelevant. He did have episodes of thought blocking at times. He previously spoke about the story of Moses. I was unable to obtain a review of systems from him. He was unable to answer questions related to mood, appetite, energy, sleep, concentration, suicidality, homicidality or auditory or visual hallucinations. Patient was unable to tell me or answer questions related to side effects from medications or any physical complaints.  Per nursing the staff patient has been refusing vitals but has been compliant with medications. He has been eating meals and drinking fluids with no problem.  During the assessment he was lying in bed covered with blankets. He had limited eye contact. He was calm. There was no irritability or agitation.  Principal Problem: Schizophrenia (HCC) Diagnosis:   Patient Active Problem List   Diagnosis Date Noted  . Undifferentiated schizophrenia (HCC) [F20.3]   . Schizophrenia (HCC) [F20.9] 09/16/2015  . Dehydration [E86.0] 09/16/2015  . Hypernatremia [E87.0] 09/16/2015  . Diabetes (HCC) [E11.9] 09/16/2015  . Hypothyroid [E03.9] 09/16/2015  . Noncompliance [Z91.19] 09/16/2015  . Agitation [R45.1] 09/16/2015   Total Time spent with patient: 30 minutes  Past Psychiatric History: Patient carries a diagnosis of schizophrenia. [facility] in [facility] past.  Past Medical History:  Past Medical History  Diagnosis Date  . Schizophrenia (HCC)   . Diabetes mellitus without complication (HCC)   . Thyroid disease   . Hyperlipemia    No past surgical history on file.  Social History:  History  Alcohol Use: Not on file     History  Drug Use Not on file    Social History   Social History  . Marital Status: Single    Spouse Name: N/A  . Number of Children:  N/A  . Years of Education: N/A   Social History Main Topics  . Smoking status: None  . Smokeless tobacco: None  . Alcohol Use: None  . Drug Use: None  . Sexual Activity: Not Asked   Other Topics Concern  . None   Social History Narrative  . None     Current Medications: Current Facility-Administered Medications  Medication Dose Route Frequency Provider Last Rate Last Dose  . carbamazepine (TEGRETOL) 200 MG tablet           . carbamazepine (TEGRETOL) tablet 200 mg  200 mg Oral BID AC Audery Amel, MD   200 mg at 09/23/15 0741  . clonazePAM (KLONOPIN) 0.5 MG tablet           . clonazePAM (KLONOPIN) disintegrating tablet 0.5 mg  0.5 mg Oral BID Audery Amel, MD   0.5 mg at 09/23/15 0857  . levothyroxine (SYNTHROID, LEVOTHROID) tablet 75 mcg  75 mcg Oral QAC breakfast Audery Amel, MD   75 mcg at 09/23/15 0742  . metFORMIN (GLUCOPHAGE) tablet 1,000 mg  1,000 mg Oral BID WC Audery Amel, MD   1,000 mg at 09/23/15 0743  . OLANZapine (ZYPREXA) 10 MG tablet           . OLANZapine (ZYPREXA) tablet 20 mg  20 mg Oral BID Audery Amel, MD   20 mg at 09/23/15 0857  . OLANZapine zydis (ZYPREXA) disintegrating tablet 20 mg  20 mg Oral Once Audery Amel, MD   Stopped at 09/16/15 1419  .  simvastatin (ZOCOR) tablet 20 mg  20 mg Oral q1800 Audery Amel, MD   20 mg at 09/17/15 1721  . traZODone (DESYREL) tablet 100 mg  100 mg Oral QHS Audery Amel, MD   100 mg at 09/16/15 2126   Current Outpatient Prescriptions  Medication Sig Dispense Refill  . aspirin EC 81 MG tablet Take 81 mg by mouth daily.    . benztropine (COGENTIN) 1 MG tablet Take 1 mg by mouth 2 (two) times daily.    . clonazePAM (KLONOPIN) 0.5 MG tablet Take 0.5 mg by mouth 2 (two) times daily.    Marland Kitchen docusate sodium (COLACE) 100 MG capsule Take 100 mg by mouth daily as needed for mild constipation.    . folic acid (FOLVITE) 1 MG tablet Take 1 mg by mouth daily.    Marland Kitchen levothyroxine (SYNTHROID, LEVOTHROID) 125 MCG tablet  Take 62.5 mcg by mouth daily before breakfast.    . lisinopril (PRINIVIL,ZESTRIL) 20 MG tablet Take 20 mg by mouth daily.    . metFORMIN (GLUCOPHAGE) 1000 MG tablet Take 1,000 mg by mouth 2 (two) times daily with a meal.    . OLANZapine (ZYPREXA) 20 MG tablet Take 20 mg by mouth 2 (two) times daily.    . risperiDONE (RISPERDAL) 3 MG tablet Take 3 mg by mouth 2 (two) times daily.    . simvastatin (ZOCOR) 10 MG tablet Take 10 mg by mouth daily.    . vitamin B-12 (CYANOCOBALAMIN) 1000 MCG tablet Take 1,000 mcg by mouth daily.      Lab Results: No results found for this or any previous visit (from the past 48 hour(s)).  Blood Alcohol level:  Lab Results  Component Value Date   ETH <5 09/16/2015     Musculoskeletal: Strength & Muscle Tone: within normal limits Gait & Station: Unable to assess as patient was lying in bed Patient leans: N/A  Psychiatric Specialty Exam: Review of Systems  Unable to perform ROS   Blood pressure 158/90, pulse 100, temperature 98.3 F (36.8 C), temperature source Oral, resp. rate 20, SpO2 100 %.There is no height or weight on file to calculate BMI.  General Appearance: Disheveled  Eye Contact::  Fair  Speech:  Normal rate, tone and volume.  Decreased production  Volume:  Normal  Mood:  Anxious  Affect:  Flat  Thought Process:  Disorganized  Orientation:  NA  Thought Content:  NA  Suicidal Thoughts:  N/A  Homicidal Thoughts:  N/a  Memory:  NA  Judgement:  Impaired  Insight:  Lacking  Psychomotor Activity:  Decreased  Concentration:  Poor  Recall:  Poor  Fund of Knowledge:Poor  Language: Fair  Akathisia:  No  Handed:    AIMS (if indicated):     Assets:  Financial Resources/Insurance Housing  ADL's:  Intact  Cognition: WNL  Sleep:      Treatment Plan Summary: Daily contact with patient to assess and evaluate symptoms and progress in treatment and Medication management   Psychiatry: Follow-up for 60 year old man with schizophrenia.   Decision had been made because of his past history that he would not benefit from inpatient treatment on the psychiatry unit downstairs. [facility] which could more appropriately serve his needs does not have a bed available.   Patient has a guardian.  Per notes and nursing reports patient was nonverbal yesterday. Today he did talk to me however his answers were all related to my questions. He was able to tell me that he was  Saturday March 2017.  They he spoke about the story of Moses.  Nurses tell me he has been eating and drinking well. He has been compliant with medications. They do not have VS as he has been refusing  Patient was not violent not agitated not threatening but clearly not taking care of himself. Poor hygiene.  Continue to await admission to [facility]----patient in need of long-term hospitalization.  Continue olanzapine, Tegretol, clonazepam and trazodone.  Jimmy FootmanHernandez-Gonzalez,  Jaquisha Frech, MD 09/23/2015, 12:02 PM

## 2015-09-23 NOTE — ED Notes (Addendum)
Pt was agreeable to allowing RN to remove trash from his room. Pt did want to keep his apple and chips, which was permitted. RN turned on tv and pt stated "March madness. Hit 1 7."  RN tuned on channel 17 (ESPN 2). When asked what team he follows the pt stated "76ers."  Pt allowed floor to be mopped by EVS. No distress noted. Maintained on 15 minute checks and observation by security camera for safety.

## 2015-09-23 NOTE — ED Notes (Signed)
Pt held medications in his hand for a couple minutes before taking them. Pt wanted a mixture of apple juice and water. Pt then wanted this drink in a plain cup. Another RN stepped out of pt's room to pour his drink into an apple juice container (which is a plain [place] cup). Pt took 8 am medciations.Maintained on 15 minute checks and observation by security camera for safety.

## 2015-09-23 NOTE — ED Notes (Signed)
Pt was compliant with 10 am medications. Pt asked, "Did the doctor order these?" RN explained that yes, they were ordered by the doctor and if he continues to take them he will be able to go home. No distress noted. Maintained on 15 minute checks and observation by security camera for safety.

## 2015-09-23 NOTE — ED Notes (Addendum)
Pt compliant with medications. Pt seemed to comply today when RN stated the "tablets" were ordered by the doctor so he will be able to go home. Pt also prefers to to take meds with apple juice mixed with water. RN asked to get pt's vital signs. Pt stated, "No. God will fix it."  Pt is calm, lucid at times, but only briefly. No distress noted. Maintained on 15 minute checks and observation by security camera for safety.

## 2015-09-23 NOTE — ED Provider Notes (Signed)
-----------------------------------------   8:24 AM on 09/23/2015 -----------------------------------------    No acute events overnight. Patient slept the night. Patient remains medically cleared.  Disposition is pending per Psychiatry/Behavioral Medicine team recommendations.   Jene Everyobert Azarria Balint, MD 09/23/15 [phone]

## 2015-09-23 NOTE — ED Notes (Signed)
Pt laying in bed watching TV. No distress noted. Maintained on 15 minute checks and observation by security camera for safety.

## 2015-09-23 NOTE — ED Notes (Signed)
Pt laying in bed upon approach. RN informed pt he has medications he needs to take this morning. Pt asked, "From God?" RN told pt that nurses help God do His work. Pt stated, "Possibly."  RN will bring medications with breakfast. Pt was calm. Thoughts were disorganized with long pauses in his speech. Pt was staring at wall while RN was speaking. No distress noted. Maintained on 15 minute checks and observation by security camera for safety.

## 2015-09-23 NOTE — ED Notes (Signed)
Patient resting quietly in room. No noted distress or abnormal behaviors noted. Will continue 15 minute checks and observation by security camera for safety. 

## 2015-09-23 NOTE — ED Notes (Signed)
Pt sitting up in bed with head bowed. No distress noted. Pt voices no concerns. Maintained on 15 minute checks and observation by security camera for safety.

## 2015-09-23 NOTE — ED Notes (Signed)
Pt walks up to nurses station frequently to check the time and then returns to his room.

## 2015-09-23 NOTE — BH Assessment (Signed)
Writer called and confirmed with [facility] ([phone]) that patient is on their Waitlist. Nothing else needed at this time

## 2015-09-24 ENCOUNTER — Inpatient Hospital Stay
Admission: EM | Admit: 2015-09-24 | Discharge: 2015-10-10 | DRG: 885 | Disposition: A | Discharge status: Home / Self Care | Payer: Medicare Other | Source: Intra-hospital | Attending: Psychiatry | Admitting: Psychiatry

## 2015-09-24 DIAGNOSIS — R4701 Aphasia: Secondary | ICD-10-CM | POA: Diagnosis present

## 2015-09-24 DIAGNOSIS — Z8673 Personal history of transient ischemic attack (TIA), and cerebral infarction without residual deficits: Secondary | ICD-10-CM

## 2015-09-24 DIAGNOSIS — I10 Essential (primary) hypertension: Secondary | ICD-10-CM | POA: Diagnosis present

## 2015-09-24 DIAGNOSIS — E785 Hyperlipidemia, unspecified: Secondary | ICD-10-CM

## 2015-09-24 DIAGNOSIS — F209 Schizophrenia, unspecified: Secondary | ICD-10-CM | POA: Diagnosis not present

## 2015-09-24 DIAGNOSIS — R7303 Prediabetes: Secondary | ICD-10-CM

## 2015-09-24 DIAGNOSIS — E119 Type 2 diabetes mellitus without complications: Secondary | ICD-10-CM | POA: Diagnosis present

## 2015-09-24 DIAGNOSIS — F2089 Other schizophrenia: Secondary | ICD-10-CM | POA: Diagnosis not present

## 2015-09-24 DIAGNOSIS — F419 Anxiety disorder, unspecified: Secondary | ICD-10-CM | POA: Diagnosis present

## 2015-09-24 DIAGNOSIS — D573 Sickle-cell trait: Secondary | ICD-10-CM | POA: Diagnosis present

## 2015-09-24 DIAGNOSIS — G4700 Insomnia, unspecified: Secondary | ICD-10-CM | POA: Diagnosis present

## 2015-09-24 DIAGNOSIS — F203 Undifferentiated schizophrenia: Secondary | ICD-10-CM | POA: Diagnosis present

## 2015-09-24 DIAGNOSIS — E039 Hypothyroidism, unspecified: Secondary | ICD-10-CM | POA: Diagnosis present

## 2015-09-24 HISTORY — DX: Unspecified convulsions: R56.9

## 2015-09-24 HISTORY — DX: Transient cerebral ischemic attack, unspecified: G45.9

## 2015-09-24 HISTORY — DX: Toxic effect of other metals, accidental (unintentional), initial encounter: T56.891A

## 2015-09-24 HISTORY — DX: Essential (primary) hypertension: I10

## 2015-09-24 HISTORY — DX: Neutropenia, unspecified: D70.9

## 2015-09-24 HISTORY — DX: Disorder of urea cycle metabolism, unspecified: E72.20

## 2015-09-24 HISTORY — DX: Sickle-cell trait: D57.3

## 2015-09-24 MED ORDER — MAGNESIUM HYDROXIDE 400 MG/5ML PO SUSP: 30.0000 mL | Freq: Every day | ORAL | Status: DC | PRN

## 2015-09-24 MED ORDER — ACETAMINOPHEN 325 MG PO TABS: 650.0000 mg | ORAL_TABLET | Freq: Four times a day (QID) | ORAL | Status: DC | PRN

## 2015-09-24 MED ORDER — CARBAMAZEPINE 200 MG PO TABS
ORAL_TABLET | ORAL | Status: AC
  Administered 2015-09-24: 200 mg via ORAL
  Filled 2015-09-24: qty 1

## 2015-09-24 MED ORDER — OLANZAPINE 10 MG PO TABS
20.0000 mg | ORAL_TABLET | Freq: Two times a day (BID) | ORAL | Status: DC
  Administered 2015-09-24 – 2015-09-25 (×2): 20 mg via ORAL
  Filled 2015-09-24 (×2): qty 2

## 2015-09-24 MED ORDER — METFORMIN HCL 500 MG PO TABS
1000.0000 mg | ORAL_TABLET | Freq: Two times a day (BID) | ORAL | Status: DC
  Administered 2015-09-24 – 2015-10-03 (×18): 1000 mg via ORAL
  Filled 2015-09-24 (×17): qty 2

## 2015-09-24 MED ORDER — LEVOTHYROXINE SODIUM 75 MCG PO TABS
75.0000 ug | ORAL_TABLET | Freq: Every day | ORAL | Status: DC
  Administered 2015-09-25 – 2015-09-26 (×2): 75 ug via ORAL
  Filled 2015-09-24 (×2): qty 1

## 2015-09-24 MED ORDER — LORAZEPAM 2 MG PO TABS
2.0000 mg | ORAL_TABLET | Freq: Four times a day (QID) | ORAL | Status: DC | PRN
  Administered 2015-09-24: 2 mg via ORAL
  Filled 2015-09-24: qty 1

## 2015-09-24 MED ORDER — SIMVASTATIN 20 MG PO TABS
20.0000 mg | ORAL_TABLET | Freq: Every day | ORAL | Status: DC
Start: 2015-09-24 — End: 2015-10-03
  Administered 2015-09-24 – 2015-10-02 (×9): 20 mg via ORAL
  Filled 2015-09-24 (×9): qty 1

## 2015-09-24 MED ORDER — ALUM & MAG HYDROXIDE-SIMETH 200-200-20 MG/5ML PO SUSP: 30.0000 mL | ORAL | Status: DC | PRN

## 2015-09-24 MED ORDER — LEVOTHYROXINE SODIUM 75 MCG PO TABS
ORAL_TABLET | ORAL | Status: AC
  Administered 2015-09-24: 75 ug via ORAL
  Filled 2015-09-24: qty 1

## 2015-09-24 MED ORDER — CLONAZEPAM 0.5 MG PO TBDP
0.5000 mg | ORAL_TABLET | Freq: Two times a day (BID) | ORAL | Status: DC
  Administered 2015-09-24 – 2015-09-28 (×8): 0.5 mg via ORAL
  Filled 2015-09-24 (×8): qty 1

## 2015-09-24 MED ORDER — CLONAZEPAM 0.5 MG PO TABS
ORAL_TABLET | ORAL | Status: AC
  Administered 2015-09-24: 0.5 mg
  Filled 2015-09-24: qty 1

## 2015-09-24 MED ORDER — TRAZODONE HCL 100 MG PO TABS
100.0000 mg | ORAL_TABLET | Freq: Every day | ORAL | Status: DC
Start: 2015-09-24 — End: 2015-10-03
  Administered 2015-09-24 – 2015-10-02 (×9): 100 mg via ORAL
  Filled 2015-09-24 (×9): qty 1

## 2015-09-24 MED ORDER — OLANZAPINE 10 MG PO TABS
ORAL_TABLET | ORAL | Status: AC
  Administered 2015-09-24: 20 mg via ORAL
  Filled 2015-09-24: qty 2

## 2015-09-24 MED ORDER — LORAZEPAM 2 MG/ML IJ SOLN: 2.0000 mg | Freq: Four times a day (QID) | INTRAMUSCULAR | Status: DC | PRN

## 2015-09-24 MED ORDER — CARBAMAZEPINE 200 MG PO TABS
200.0000 mg | ORAL_TABLET | Freq: Two times a day (BID) | ORAL | Status: DC
  Administered 2015-09-24 – 2015-09-26 (×4): 200 mg via ORAL
  Filled 2015-09-24 (×4): qty 1

## 2015-09-24 NOTE — [facility] Group Notes (Signed)
[facility] LCSW Group Therapy  09/24/2015 4:17 PM  Type of Therapy:  Group Therapy  Participation Level:  Did Not Attend  Modes of Intervention:  Discussion, Education, Socialization and Support  Summary of Progress/Problems: Self esteem: Patients discussed self esteem and how it impacts them. They discussed what aspects in their lives has influenced their self esteem. They were challenged to identify changes that are needed in order to improve self esteem.    Trisha Ken L Pat Sires MSW, LCSWA  09/24/2015, 4:17 PM   

## 2015-09-24 NOTE — ED Notes (Signed)

## 2015-09-24 NOTE — Consult Note (Addendum)
[facility] Psych ED Progress Note  09/24/2015 11:02 AM [name]  MRN:  [id number] Subjective:  No events overnight.  Patient continues to be disorganized. He is answer to my questions were irrelevant. He did have episodes of thought blocking at times. He previously spoke about the story of Moses. I was unable to obtain a review of systems from him. He was unable to answer questions related to mood, appetite, energy, sleep, concentration, suicidality, homicidality or auditory or visual hallucinations. Patient was unable to tell me or answer questions related to side effects from medications or any physical complaints.  Per nursing the staff patient has been refusing vitals but has been compliant with medications. He has been eating meals and drinking fluids with no problem.  During the assessment he was lying in bed covered with blankets. He had limited eye contact. He was calm. There was no irritability or agitation.  Per WU:[id number] is able to return to group home as per Leontine Locket [phone] Patient has a guardian Henreitta Cea of [place] [employer] who is also finding patient a better placement. Leontine Locket will Keep patient until other placement is found. [facility] act Team is also involved and is looking for suitable placement.  Principal Problem: Schizophrenia (HCC) Diagnosis:   Patient Active Problem List   Diagnosis Date Noted  . Undifferentiated schizophrenia (HCC) [F20.3]   . Schizophrenia (HCC) [F20.9] 09/16/2015  . Dehydration [E86.0] 09/16/2015  . Hypernatremia [E87.0] 09/16/2015  . Diabetes (HCC) [E11.9] 09/16/2015  . Hypothyroid [E03.9] 09/16/2015  . Noncompliance [Z91.19] 09/16/2015  . Agitation [R45.1] 09/16/2015   Total Time spent with patient: 30 minutes  Past Psychiatric History: Patient carries a diagnosis of schizophrenia. [facility] in the past.  Past Medical History:  Past Medical History  Diagnosis Date  . Schizophrenia  (HCC)   . Diabetes mellitus without complication (HCC)   . Thyroid disease   . Hyperlipemia    No past surgical history on file.  Social History:  History  Alcohol Use: Not on file     History  Drug Use Not on file    Social History   Social History  . Marital Status: Single    Spouse Name: N/A  . Number of Children: N/A  . Years of Education: N/A   Social History Main Topics  . Smoking status: None  . Smokeless tobacco: None  . Alcohol Use: None  . Drug Use: None  . Sexual Activity: Not Asked   Other Topics Concern  . None   Social History Narrative  . None     Current Medications: Current Facility-Administered Medications  Medication Dose Route Frequency Provider Last Rate Last Dose  . carbamazepine (TEGRETOL) tablet 200 mg  200 mg Oral BID AC Audery Amel, MD   200 mg at 09/24/15 0843  . clonazePAM (KLONOPIN) disintegrating tablet 0.5 mg  0.5 mg Oral BID Audery Amel, MD   0.5 mg at 09/24/15 0842  . levothyroxine (SYNTHROID, LEVOTHROID) tablet 75 mcg  75 mcg Oral QAC breakfast Audery Amel, MD   75 mcg at 09/24/15 0843  . metFORMIN (GLUCOPHAGE) tablet 1,000 mg  1,000 mg Oral BID WC Audery Amel, MD   1,000 mg at 09/24/15 0846  . OLANZapine (ZYPREXA) tablet 20 mg  20 mg Oral BID Audery Amel, MD   20 mg at 09/24/15 0844  . OLANZapine zydis (ZYPREXA) disintegrating tablet 20 mg  20 mg Oral Once John T  Clapacs, MD   Stopped at 09/16/15 1419  . simvastatin (ZOCOR) tablet 20 mg  20 mg Oral q1800 Audery AmelJohn T Clapacs, MD   20 mg at 09/23/15 1747  . traZODone (DESYREL) tablet 100 mg  100 mg Oral QHS Audery AmelJohn T Clapacs, MD   100 mg at 09/16/15 2126   Current Outpatient Prescriptions  Medication Sig Dispense Refill  . aspirin EC 81 MG tablet Take 81 mg by mouth daily.    . benztropine (COGENTIN) 1 MG tablet Take 1 mg by mouth 2 (two) times daily.    . clonazePAM (KLONOPIN) 0.5 MG tablet Take 0.5 mg by mouth 2 (two) times daily.    Marland Kitchen. docusate sodium (COLACE) 100 MG  capsule Take 100 mg by mouth daily as needed for mild constipation.    . folic acid (FOLVITE) 1 MG tablet Take 1 mg by mouth daily.    Marland Kitchen. levothyroxine (SYNTHROID, LEVOTHROID) 125 MCG tablet Take 62.5 mcg by mouth daily before breakfast.    . lisinopril (PRINIVIL,ZESTRIL) 20 MG tablet Take 20 mg by mouth daily.    . metFORMIN (GLUCOPHAGE) 1000 MG tablet Take 1,000 mg by mouth 2 (two) times daily with a meal.    . OLANZapine (ZYPREXA) 20 MG tablet Take 20 mg by mouth 2 (two) times daily.    . risperiDONE (RISPERDAL) 3 MG tablet Take 3 mg by mouth 2 (two) times daily.    . simvastatin (ZOCOR) 10 MG tablet Take 10 mg by mouth daily.    . vitamin B-12 (CYANOCOBALAMIN) 1000 MCG tablet Take 1,000 mcg by mouth daily.      Lab Results: No results found for this or any previous visit (from the past 48 hour(s)).  Blood Alcohol level:  Lab Results  Component Value Date   ETH <5 09/16/2015     Musculoskeletal: Strength & Muscle Tone: within normal limits Gait & Station: Unable to assess as patient was lying in bed Patient leans: N/A  Psychiatric Specialty Exam: Review of Systems  Unable to perform ROS   Blood pressure 158/90, pulse 100, temperature 98.3 F (36.8 C), temperature source Oral, resp. rate 20, SpO2 100 %.There is no height or weight on file to calculate BMI.  General Appearance: Disheveled  Eye Contact::  Fair  Speech:  Normal rate, tone and volume.  Decreased production  Volume:  Normal  Mood:  Anxious  Affect:  Flat  Thought Process:  Disorganized  Orientation:  NA  Thought Content:  NA  Suicidal Thoughts:  N/A  Homicidal Thoughts:  N/a  Memory:  NA  Judgement:  Impaired  Insight:  Lacking  Psychomotor Activity:  Decreased  Concentration:  Poor  Recall:  Poor  Fund of Knowledge:Poor  Language: Fair  Akathisia:  No  Handed:    AIMS (if indicated):     Assets:  Financial Resources/Insurance Housing  ADL's:  Intact  Cognition: WNL  Sleep:      Treatment Plan  Summary: Daily contact with patient to assess and evaluate symptoms and progress in treatment and Medication management   Psychiatry: Follow-up for [age] year old man with schizophrenia.  Pt is still  disorganized but has been more cooperative with nursing here in the ER.  He has been eating and drinking and has been taking his meds (crush and mixed with apple sauce).  No aggression or agitation.  Patient has a guardian.  Patient was not violent not agitated not threatening but clearly not taking care of himself. Poor hygiene.  Continue olanzapine, Tegretol, clonazepam  and trazodone.  We discussed the case with nursing and SW in the ER and nurses in the unit.  Decision has been made to admit to our unit.   Jimmy Footman, MD 09/24/2015, 11:02 AM

## 2015-09-24 NOTE — ED Notes (Signed)
Patient resting quietly in room. No noted distress or abnormal behaviors noted. Will continue 15 minute checks and observation by security camera for safety. 

## 2015-09-24 NOTE — Progress Notes (Signed)
Patient got transferred here by ED nurses.Patient came down with a long sheet wrapped on his waist.He refused to take the sheet off & do the body search on him.Stated that he does not want to be here.Patient started verbally aggressive & stated that he does not want to listen to any body.Put patient back to his room.

## 2015-09-24 NOTE — ED Provider Notes (Signed)
-----------------------------------------   7:23 AM on 09/24/2015 -----------------------------------------   Blood pressure 158/90, pulse 100, temperature 98.3 F (36.8 C), temperature source Oral, resp. rate 20, SpO2 100 %.  The patient had no acute events overnight.  Sleeping comfortably at this time.  Disposition is pending per Psychiatry/Behavioral Medicine team recommendations.     Jeanmarie PlantJames A McShane, MD 09/24/15 [phone]

## 2015-09-24 NOTE — ED Notes (Signed)
Patient awake and responsive to RN. Ate breakfast and taking adequate fluids. Still exhibiting delusional thinking. Patient took all morning medications crushed in applesauce without incident.  Patient was offered shower, and was given a paper bag for trash.  Maintained on 15 minute checks and observation by security camera for safety.

## 2015-09-24 NOTE — Tx Team (Signed)
Initial Interdisciplinary Treatment Plan   PATIENT STRESSORS: Medication change or noncompliance Substance abuse   PATIENT STRENGTHS: Communication skills Physical Health   PROBLEM LIST: Problem List/Patient Goals Date to be addressed Date deferred Reason deferred Estimated date of resolution  Schizophrenia  09/24/2015     Aggressive behaviors  09/24/2015                                                DISCHARGE CRITERIA:  Adequate post-discharge living arrangements Reduction of life-threatening or endangering symptoms to within safe limits  PRELIMINARY DISCHARGE PLAN: Placement in alternative living arrangements Return to previous living arrangement  PATIENT/FAMIILY INVOLVEME This treatment plan has been presented to and reviewed with the patient, [name], and/or family member,  The patient and family have been given the opportunity to ask questions and make suggestions.  [name] 09/24/2015, 3:58 PM

## 2015-09-24 NOTE — ED Notes (Signed)
[facility] transferred [facility] for inpatient treatment. Cooperative with transfer. 

## 2015-09-24 NOTE — Plan of Care (Signed)
Problem: Alteration in thought process Goal: STG-Patient is able to follow short directions Outcome: Progressing Pt follows directions related to medication administration with much encouragement from Clinical research associatewriter.  Problem: Ineffective individual coping Goal: STG: Patient will remain free from self harm Outcome: Progressing Pt remains free from harm.

## 2015-09-24 NOTE — Progress Notes (Signed)
LCSW consulted with [facility] nurse and Dr Ardyth HarpsHernandez and [facility] Patient is able to return to group home as per [name] [phone] Patient has a guardian  [name] of [place] [employer] who is also finding patient a better placement. [name] will  Keep patient until other placement is found. [facility] act Team is also involved and is looking for suitable placement.  Delta Air LinesClaudine Yamir Carignan LCSW [phone]

## 2015-09-24 NOTE — ED Notes (Signed)
Patient ate lunch. He was told that he would be transferred to inpatient unit this afternoon.Maintained on 15 minute checks and observation by security camera for safety.

## 2015-09-25 ENCOUNTER — Encounter: Payer: Self-pay | Admitting: Psychiatry

## 2015-09-25 DIAGNOSIS — E785 Hyperlipidemia, unspecified: Secondary | ICD-10-CM

## 2015-09-25 DIAGNOSIS — F203 Undifferentiated schizophrenia: Principal | ICD-10-CM

## 2015-09-25 MED ORDER — OLANZAPINE 5 MG PO TBDP
20.0000 mg | ORAL_TABLET | Freq: Two times a day (BID) | ORAL | Status: DC
  Administered 2015-09-26: 20 mg via ORAL
  Filled 2015-09-25: qty 4

## 2015-09-25 MED ORDER — OLANZAPINE 5 MG PO TBDP
20.0000 mg | ORAL_TABLET | Freq: Two times a day (BID) | ORAL | Status: DC
  Administered 2015-09-25: 20 mg via ORAL
  Filled 2015-09-25: qty 4

## 2015-09-25 NOTE — Plan of Care (Signed)
Problem: Alteration in thought process Goal: LTG-Patient behavior demonstrates decreased signs psychosis (Patient behavior demonstrates decreased signs of psychosis to the point the patient is safe to return home and continue treatment in an outpatient setting.)  Outcome: Progressing No aggressive behaviors noted.

## 2015-09-25 NOTE — Progress Notes (Signed)
Patient ID: [name], male   DOB: 1956-05-14, 60 y.o.   MRN: [id number] CSW made an attempt to complete a PSA with the pt, but the pt could not respond to requests or prompts from the CSw and his thinking and speech were disorganized and not connected to questions from the CSW.

## 2015-09-25 NOTE — Progress Notes (Signed)
Patient is calm & seclusive in the room.Came out to the milieu for meals.His thoughts are disorganized & gives irrelevant answers to any questions.Before he takes medicines he states "God will heal."He takes his own time to takes the medicine.Did not attend groups.Appetite & energy level good.

## 2015-09-25 NOTE — [facility] Group Notes (Signed)
[facility] LCSW Aftercare Discharge Planning Group Note  09/25/2015 9:30 AM  Participation Quality: Did Not Attend. Patient invited to participate but declined.   Decklin Weddington F. Tabb Croghan, MSW, LCSWA, LCAS    

## 2015-09-25 NOTE — Progress Notes (Signed)
Recreation Therapy Notes  According to patient's doctor and social worker, patient is responding with irrevelvant answers to the questions being asked or he could not respond with the prompts that were made. LRT will attempt assessment at a later time.  Jacquelynn CreeGreene,Toron Bowring M, LRT/CTRS 09/25/2015 4:39 PM

## 2015-09-25 NOTE — Progress Notes (Signed)
LCSW ED Nykerria Macconnell- Called [facility]- Patient is on wait list and was told it will be a while.  Mykela Mewborn LCSW [phone] 

## 2015-09-25 NOTE — [facility] Suicide Risk Assessment (Signed)
[facility]   Nursing information obtained from:    Demographic factors:  Male Current Mental Status:  NA Loss Factors:  NA Historical Factors:  NA Risk Reduction Factors:  Living with another person, especially a relative  Total Time spent with patient: 1 hour Principal Problem: Undifferentiated schizophrenia (HCC) Diagnosis:   Patient Active Problem List   Diagnosis Date Noted  . Dyslipidemia [E78.5] 09/25/2015  . Undifferentiated schizophrenia (HCC) [F20.3]   . Diabetes (HCC) [E11.9] 09/16/2015  . Hypothyroid [E03.9] 09/16/2015   Subjective Data:   Continued Clinical Symptoms:    The "Alcohol Use Disorders Identification Test", Guidelines for Use in Primary Care, Second Edition.  World Science writerHealth Organization [facility](WHO). Score between 0-7:  no or low risk or alcohol related problems. Score between 8-15:  moderate risk of alcohol related problems. Score between 16-19:  high risk of alcohol related problems. Score 20 or above:  warrants further diagnostic evaluation for alcohol dependence and treatment.     Psychiatric Specialty Exam: ROS  There were no vitals taken for this visit.There is no height or weight on file to calculate BMI.                                                        COGNITIVE FEATURES THAT CONTRIBUTE TO RISK:  Loss of executive function    SUICIDE RISK:   Moderate:  Frequent suicidal ideation with limited intensity, and duration, some specificity in terms of plans, no associated intent, good self-control, limited dysphoria/symptomatology, some risk factors present, and identifiable protective factors, including available and accessible social support.  [facility]  I certify that inpatient services furnished can reasonably be expected to improve the patient's condition.   Jimmy FootmanHernandez-Gonzalez,  Hokulani Rogel, MD 09/25/2015, 3:02 PM

## 2015-09-25 NOTE — Progress Notes (Signed)
D: Pt remains isolative in his room this evening. Upon approach, it takes several attempts to get him to answer. Pt is hyperreligious, stating "God is good" and "God will take care of it." Pt does not answer assessment questions related to mood, SI, HI, or AVH. A: Emotional support and encouragement provided. Medications administered with much encouragement from Clinical research associatewriter. q15 minute safety checks maintained. R: Pt remains free from harm. Will continue to monitor.

## 2015-09-25 NOTE — [facility] Group Notes (Signed)
[facility] Group Notes:  (Nursing/MHT/Case Management/Adjunct)  Date:  09/25/2015  Time:  5:39 AM  Type of Therapy:  Group Therapy  Participation Level:  Did Not Attend   Summary of Progress/Problems:  Veva Holesshley Imani Kryssa Risenhoover 09/25/2015, 5:39 AM

## 2015-09-25 NOTE — H&P (Signed)
Psychiatric Admission Assessment Adult  Patient Identification: [name] MRN:  [id number] Date of Evaluation:  09/25/2015 Chief Complaint:  Schizophrenia Principal Diagnosis: Undifferentiated schizophrenia (HCC) Diagnosis:   Patient Active Problem List   Diagnosis Date Noted  . Dyslipidemia [E78.5] 09/25/2015  . Undifferentiated schizophrenia (HCC) [F20.3]   . Diabetes (HCC) [E11.9] 09/16/2015  . Hypothyroid [E03.9] 09/16/2015   History of Present Illness:  Pt arrived to our ER on 3/11.  [name] is a 6'6" tall, 230 lbs 60 y.o., African American male, who presents to the ER due to his Group Home having concerns about his physical and mental health. Majority of the information gathered for this assessment was from patient Medical chart and Group Home Staff.  Patient has a history of stopping his medications and being religious preoccupied.  According to the Group Home, 3 or 4 days prior to coming here the patient show more severe psychotic symptoms. He only has been eating once a day and has been refusing medications. Group Home staff states, they are unsure how long he has gone without his medications. They found some of his medications in his room, when they were cleaning it. Patient usually take his meds in front of staff and is also required to check his mouth. However, he has learned how to hide the pills in his mouth, so staff can't see them. When he goes to his room, he hides them in his closet. Patient goes in his closet and sit there for several hours "mumbling to his self." In the past, patient was going to his closet to prayer, "because that is what Jesus said in the scriptures."  Patient hygiene has decrease. Patient has refused to bathe and has had on the same clothes for several days. Patient refused to take them off and allow Group Home staff wash them. Patient is currently disheveled and have a strong odor coming from his person.   Upon arrival to the ER it took several  staff members to restrain him to get lab work. They were unsuccessful with having him change his clothing.  His labs revealed hypernatremia his sodium was 151. He was in acute renal failure.  Creatinine was 1.52 and his GFR was 56 and BUN 46.  Test results are likely due to dehydration and poor oral intake.  Due to his high level of agitation patient was placed on the waiting list from [facility] where he has been hospitalized a multitude of times in the past. The patient was in our emergency department awaiting for bed there from 3/11 to 3/19.  File decision was made to hospitalize him yesterday after having several days of no aggression, no agitation. The patient per nursing in the emergency room, has been compliant with all his oral medications and has been eating and drinking with no difficulties. Patient has been refusing daily vitals.    Laboratory work was repeated on March 14. Showing resolution of hypernatremia and acute renal insufficiency.   Patient is currently living at "[name][phone])." He moved in 04/2015, from [facility] Group Home staff was unsure from what hospital he discharged from.  [facility] DSS, is his Guardian [name]).  Patient currently receives Outpatient Treatment with [facility] [phone]), with their ACT Team.  I saw this patient today in 2 days prior to this admission while he was in the emergency room. His answers to my questions are irrelevant. Most of the statements he made our about  God and God will. He is unable to complete a review of systems form psychiatric and nonpsychiatric symptoms.  Substance abuse history: No known history of substance abuse.  Associated Signs/Symptoms: Depression Symptoms:  Unable to assess (Hypo) Manic Symptoms:  Impulsivity, Anxiety Symptoms:  Unable to assess Psychotic Symptoms:  Disorganized behaviors,  poor hygiene, poor self-care, hyper religious concerns PTSD Symptoms: NA Total Time spent with patient: 1 hour  Past Psychiatric History: [facility]. Has history of refractory schizophrenia.  Is the patient at risk to self? Yes.    Has the patient been a risk to self in the past 6 months? No.  Has the patient been a risk to self within the distant past? Yes.    Is the patient a risk to others? No.  Has the patient been a risk to others in the past 6 months? No.  Has the patient been a risk to others within the distant past? No.   Past Medical History:  Past Medical History  Diagnosis Date  . Schizophrenia (HCC)   . Diabetes mellitus without complication (HCC)   . Thyroid disease   . Hyperlipemia    History reviewed. No pertinent past surgical history.   Family History: Unknown family history of mental illness or suicide  Social History:  History  Alcohol Use: Not on file     History  Drug Use Not on file     Allergies:  No Known Allergies   Lab Results: No results found for this or any previous visit (from the past 48 hour(s)).  Blood Alcohol level:  Lab Results  Component Value Date   ETH <5 09/16/2015    Metabolic Disorder Labs:  No results found for: HGBA1C, MPG No results found for: PROLACTIN No results found for: CHOL, TRIG, HDL, CHOLHDL, VLDL, LDLCALC  Current Medications: Current Facility-Administered Medications  Medication Dose Route Frequency Provider Last Rate Last Dose  . acetaminophen (TYLENOL) tablet 650 mg  650 mg Oral Q6H PRN Jimmy FootmanAndrea Hernandez-Gonzalez, MD      . alum & mag hydroxide-simeth (MAALOX/MYLANTA) 200-200-20 MG/5ML suspension 30 mL  30 mL Oral Q4H PRN Jimmy FootmanAndrea Hernandez-Gonzalez, MD      . carbamazepine (TEGRETOL) tablet 200 mg  200 mg Oral BID AC Jimmy FootmanAndrea Hernandez-Gonzalez, MD   200 mg at 09/25/15 [id number]  . clonazePAM (KLONOPIN) disintegrating tablet 0.5 mg  0.5 mg Oral BID Jimmy FootmanAndrea  Hernandez-Gonzalez, MD   0.5 mg at 09/25/15 0803  . levothyroxine (SYNTHROID, LEVOTHROID) tablet 75 mcg  75 mcg Oral QAC breakfast Jimmy FootmanAndrea Hernandez-Gonzalez, MD   75 mcg at 09/25/15 [phone]  . LORazepam (ATIVAN) tablet 2 mg  2 mg Oral Q6H PRN Jimmy FootmanAndrea Hernandez-Gonzalez, MD   2 mg at 09/24/15 1527   Or  . LORazepam (ATIVAN) injection 2 mg  2 mg Intramuscular Q6H PRN Jimmy FootmanAndrea Hernandez-Gonzalez, MD      . magnesium hydroxide (MILK OF MAGNESIA) suspension 30 mL  30 mL Oral Daily PRN Jimmy FootmanAndrea Hernandez-Gonzalez, MD      . metFORMIN (GLUCOPHAGE) tablet 1,000 mg  1,000 mg Oral BID WC Jimmy FootmanAndrea Hernandez-Gonzalez, MD   1,000 mg at 09/25/15 0803  . OLANZapine zydis (ZYPREXA) disintegrating tablet 20 mg  20 mg Oral BID Jimmy FootmanAndrea Hernandez-Gonzalez, MD      . simvastatin (ZOCOR) tablet 20 mg  20 mg Oral q1800 Jimmy FootmanAndrea Hernandez-Gonzalez, MD   20 mg at 09/24/15 1725  . traZODone (DESYREL) tablet 100 mg  100 mg Oral QHS Jimmy FootmanAndrea Hernandez-Gonzalez, MD   100  mg at 09/24/15 2153   PTA Medications: No prescriptions prior to admission    Musculoskeletal: Strength & Muscle Tone: within normal limits Gait & Station: normal Patient leans: N/A  Psychiatric Specialty Exam: Physical Exam  Constitutional: He appears well-developed and well-nourished.  Eyes: Conjunctivae and EOM are normal.  Neck: Normal range of motion.  Respiratory: Effort normal.  Musculoskeletal: Normal range of motion.  Neurological: He is alert.    Review of Systems  Unable to perform ROS   There were no vitals taken for this visit.There is no height or weight on file to calculate BMI.  General Appearance: Disheveled  Eye Contact::  Poor  Speech:  Pressured  Volume:  Normal  Mood:  Anxious  Affect:  Flat  Thought Process:  Disorganized  Orientation:  NA  Thought Content:  Patient thought process is disorganized. He is statements are mainly hyper religious  Suicidal Thoughts:  n/a  Homicidal Thoughts:  n/a  Memory:  NA  Judgement:  Impaired   Insight:  Lacking  Psychomotor Activity:  Normal  Concentration:  Poor  Recall:  NA  Fund of Knowledge:NA  Language: Poor  Akathisia:  No  Handed:    AIMS (if indicated):     Assets:  Financial Resources/Insurance Housing Social Support  ADL's:  Intact  Cognition: WNL  Sleep:  Number of Hours: 5.5    Treatment Plan Summary:  Schizophrenia: Continue Zyprexa 20 mg by mouth twice a day. I will change the Zyprexa to orally disintegrating tablet as patient has history of cheeking medications. Seems to be improving as he is now eating, no longer dehydrated or hyper academic, compliant with medications.  History of aggression: Continue Tegretol 200 mg by mouth twice a day.  Unable to check Tegretol level as patient will not allow any blood work.  Anxiety: Continue clonazepam  Agitation: Continue Ativan 2 mg by mouth or IM every 6 hours as needed  Insomnia: Continue trazodone 100 mg by mouth daily at bedtime  Dyslipidemia: Continue Zocor 20 mg by mouth daily  Diabetes: Continue Glucophage at thousand milligrams by mouth twice a day  Precautions every 15 minute checks  Diet regular  Hospitalization and status continue involuntary commitment  Disposition: Per Child psychotherapist apparently patient is able to return temporarily to his current group home  Discharge follow-up continue with ACT services.  Labs: Unable to obtain any labs as patient refuses. Not able to draw lipid panel, hemoglobin A1c, or prolactin levels..  I certify that inpatient services furnished can reasonably be expected to improve the patient's condition.    Jimmy Footman, MD 3/20/20171:59 PM

## 2015-09-25 NOTE — BHH Group Notes (Addendum)
[facility] LCSW Group Therapy   09/25/2015 1pm  Type of Therapy: Group Therapy   Participation Level: Did Not Attend. Patient invited to participate but declined.    Callista Hoh F. Gabriel Paulding, MSW, LCSWA, LCAS   

## 2015-09-25 NOTE — Progress Notes (Signed)
Recreation Therapy Notes  Date: 03.20.17 Time: 3:00 pm Location: Craft Room  Group Topic: Self-expression  Goal Area(s) Addresses:  Patient will identify one color per emotion listed on wheel. Patient will verbalize benefit of using art as a means of self-expression. Patient will be educated on other forms of self-expression.  Behavioral Response: Arrived late, Inattentive  Intervention: Emotion Wheel  Activity: Patients were given Emotion Wheel worksheets and instructed to pick a color for each emotion and color in the section.  Education: LRT educated patients on different forms of self-expression.  Education Outcome: In group clarification offered   Clinical Observations/Feedback: Patient arrived to group at approximately 3:27 pm. Patient had questions on group and if it was related to his d/c. LRT explained the activity. Patient wrote all over his worksheet. Patient did not contribute to group discussion.  Jacquelynn CreeGreene,Noemy Hallmon M, LRT/CTRS 09/25/2015 4:31 PM

## 2015-09-26 MED ORDER — OLANZAPINE 10 MG IM SOLR
10.0000 mg | Freq: Two times a day (BID) | INTRAMUSCULAR | Status: DC
Start: 2015-09-26 — End: 2015-09-30
  Filled 2015-09-26: qty 10

## 2015-09-26 MED ORDER — LEVOTHYROXINE SODIUM 25 MCG PO TABS
125.0000 ug | ORAL_TABLET | Freq: Every day | ORAL | Status: DC
  Filled 2015-09-26 (×2): qty 1

## 2015-09-26 MED ORDER — AMANTADINE HCL 100 MG PO CAPS
100.0000 mg | ORAL_CAPSULE | Freq: Two times a day (BID) | ORAL | Status: DC
  Administered 2015-09-26 – 2015-10-03 (×15): 100 mg via ORAL
  Filled 2015-09-26 (×14): qty 1

## 2015-09-26 MED ORDER — HALOPERIDOL 5 MG PO TABS
5.0000 mg | ORAL_TABLET | Freq: Two times a day (BID) | ORAL | Status: DC
  Administered 2015-09-26 – 2015-09-27 (×3): 5 mg via ORAL
  Filled 2015-09-26 (×2): qty 1

## 2015-09-26 MED ORDER — OLANZAPINE 10 MG PO TABS
20.0000 mg | ORAL_TABLET | Freq: Two times a day (BID) | ORAL | Status: DC
  Administered 2015-09-26 – 2015-09-30 (×8): 20 mg via ORAL
  Filled 2015-09-26 (×8): qty 2

## 2015-09-26 NOTE — Tx Team (Addendum)
Interdisciplinary Treatment Plan Update (Adult)        Date: 09/26/2015   Time Reviewed: 9:30 AM   Progress in Treatment: Improving  Attending groups: No Participating in groups: No Taking medication as prescribed: Yes  Tolerating medication: Yes  Family/Significant other contact made: Yes, CSW has spoken to the pt's group home Patient understands diagnosis: Yes  Discussing patient identified problems/goals with staff: Yes  Medical problems stabilized or resolved: Yes  Denies suicidal/homicidal ideation: Yes  Issues/concerns per patient self-inventory: Yes  Other:   New problem(s) identified: N/A   Discharge Plan or Barriers: CSW continuing to assess, patient new to milieu.   Reason for Continuation of Hospitalization:   Depression   Anxiety   Medication Stabilization   Comments: N/A   Estimated length of stay: 3-5 days             Pt is a 60 year rold male arrived to our ER on 3/11 and was admitted due to concerns of his group home for his mental and physical health.  Pt lives in [place].  Callaway Hailes is a 6'6" tall, 230 lbs 60 y.o., African American male, who presents to the ER due to his [facility] having concerns about his physical and mental health. Majority of the information gathered for this assessment was from patient Medical chart and Group Home Staff.  Patient has a history of stopping his medications and being religious preoccupied.  According to the Group Home, 3 or 4 days prior to coming here the patient show more severe psychotic symptoms. He only has been eating once a day and has been refusing medications. Group Home staff states, they are unsure how long he has gone without his medications. They found some of his medications in his room, when they were cleaning it. Patient usually take his meds in front of staff and is also required to check his mouth. However, he has learned how to hide the pills in his mouth, so staff can't see them. When he goes to his  room, he hides them in his closet. Patient goes in his closet and sit there for several hours "mumbling to his self." In the past, patient was going to his closet to prayer, "because that is what Jesus said in the scriptures."  Patient hygiene has decrease. Patient has refused to bathe and has had on the same clothes for several days. Patient refused to take them off and allow Group Home staff wash them. Patient is currently disheveled and have a strong odor coming from his person.   Upon arrival to the ER it took several staff members to restrain him to get lab work. They were unsuccessful with having him change his clothing.  His labs revealed hypernatremia his sodium was 151. He was in acute renal failure.  Creatinine was 1.52 and his GFR was 56 and BUN 46.  Test results are likely due to dehydration and poor oral intake.  Due to his high level of agitation patient was placed on the waiting list from [facility] where he has been hospitalized a multitude of times in the past. The patient was in our emergency department awaiting for bed there from 3/11 to 3/19.  File decision was made to hospitalize him yesterday after having several days of no aggression, no agitation. The patient per nursing in the emergency room, has been compliant with all his oral medications and has been eating and drinking with no difficulties. Patient has been refusing daily vitals.  Laboratory work was repeated on March 14. Showing resolution of hypernatremia and acute renal insufficiency.  Patient is currently living at "[facility]-[phone])." He moved in 04/2015, from [facility]." Group Home staff was unsure from what hospital he discharged from.  [facility], is his Guardian Marga [phone]).  Patient currently receives Outpatient Treatment with [facility] [phone]), with their ACT Team.  Per Dr's notes: I saw this  patient today in 2 days prior to this admission while he was in the emergency room. His answers to my questions are irrelevant. Most of the statements he made our about God and God will. He is unable to complete a review of systems form psychiatric and nonpsychiatric symptoms.  Substance abuse history: No known history of substance abuse.  Patient and CSW reviewed pt's identified goals and treatment plan. Pt verbalized understanding and agreed to treatment plan.        Review of initial/current patient goals per problem list:  1. Goal(s): Patient will participate in aftercare plan   Met: No  Target date: 3-5 days post admission date   As evidenced by: Patient will participate within aftercare plan AEB aftercare provider and housing plan at discharge being identified.   3/21: CSW still assessing for appropriate contacts    2. Goal (s): Patient will exhibit decreased depressive symptoms and suicidal ideations.   Met: No  Target date: 3-5 days post admission date   As evidenced by: Patient will utilize self-rating of depression at 3 or below and demonstrate decreased signs of depression or be deemed stable for discharge by MD.   3/21: Goal progressing.    3. Goal(s): Patient will demonstrate decreased signs and symptoms of anxiety.   Met: No  Target date: 3-5 days post admission date   As evidenced by: Patient will utilize self-rating of anxiety at 3 or below and demonstrated decreased signs of anxiety, or be deemed stable for discharge by MD   3/21: Goal progressing.    4. Goal(s): Patient will demonstrate decreased signs of psychosis  * Met: No * Target date: 3-5 days post admission date  * As evidenced by: Patient will demonstrate decreased frequency of AVH or return to baseline function   3/21: Goal progressing.     Attendees:  Patient:  Family:  Physician: Dr. Jerilee Hoh, MD     09/26/2015 9:30 AM  Nursing: Nicanor Bake, RN      09/26/2015 9:30 AM  Clinical  Social Worker: Marylou Flesher, [place]   09/26/2015 9:30 AM  Clinical Social Worker:     09/26/2015 9:30 AM  Clinical Social Worker:     09/26/2015 9:30 AM  Nursing: Carolynn Sayers, RN     09/26/2015 9:30 AM  Nursing:  Alden Server , RN    09/26/2015 9:30 AM

## 2015-09-26 NOTE — Progress Notes (Signed)
[facility]  09/26/2015 9:27 AM [name]  MRN:  [id number] Subjective:  Refusing meds this am.  Ordered non emergency forced medications (olanzapine 10 mg IM if he refuses oral olanzapine).  Pt's statements were unrelated to my questions.  Pt says he should be getting $734 as it was our agreement.  Eventually took all medications with ice cream  Per nursing: D: Pt is calm and cooperative this evening. He comes out to the milieu for snack. Pt's thoughts remain disorganized and hyperreligious, stating "God will take care of it". He is unable to answer assessment questions. Speech is incoherent. Pt requires much encouragement to take his medications. A: Emotional support and encouragement provided. Medications administered with education. q15 minute safety checks maintained. R: Pt remains free from harm. Will continue to monitor.  Principal Problem: Undifferentiated schizophrenia (HCC) Diagnosis:   Patient Active Problem List   Diagnosis Date Noted  . Dyslipidemia [E78.5] 09/25/2015  . Undifferentiated schizophrenia (HCC) [F20.3]   . Diabetes (HCC) [E11.9] 09/16/2015  . Hypothyroid [E03.9] 09/16/2015   Total Time spent with patient: 30 minutes    Past Medical History:  Past Medical History  Diagnosis Date  . Schizophrenia (HCC)   . Diabetes mellitus without complication (HCC)   . Thyroid disease   . Hyperlipemia    History reviewed. No pertinent past surgical history.  Social History:  History  Alcohol Use: Not on file     History  Drug Use Not on file    Social History   Social History  . Marital Status: Single    Spouse Name: N/A  . Number of Children: N/A  . Years of Education: N/A   Social History Main Topics  . Smoking status: Unknown If Ever Smoked  . Smokeless tobacco: None  . Alcohol Use: None  . Drug Use: None  . Sexual Activity: Not Asked   Other Topics Concern  . None   Social History Narrative      Current Medications: Current  Facility-Administered Medications  Medication Dose Route Frequency Provider Last Rate Last Dose  . acetaminophen (TYLENOL) tablet 650 mg  650 mg Oral Q6H PRN Jimmy FootmanAndrea Hernandez-Gonzalez, MD      . alum & mag hydroxide-simeth (MAALOX/MYLANTA) 200-200-20 MG/5ML suspension 30 mL  30 mL Oral Q4H PRN Jimmy FootmanAndrea Hernandez-Gonzalez, MD      . carbamazepine (TEGRETOL) tablet 200 mg  200 mg Oral BID AC Jimmy FootmanAndrea Hernandez-Gonzalez, MD   200 mg at 09/26/15 0641  . clonazePAM (KLONOPIN) disintegrating tablet 0.5 mg  0.5 mg Oral BID Jimmy FootmanAndrea Hernandez-Gonzalez, MD   0.5 mg at 09/25/15 2142  . levothyroxine (SYNTHROID, LEVOTHROID) tablet 75 mcg  75 mcg Oral QAC breakfast Jimmy FootmanAndrea Hernandez-Gonzalez, MD   75 mcg at 09/26/15 0640  . LORazepam (ATIVAN) tablet 2 mg  2 mg Oral Q6H PRN Jimmy FootmanAndrea Hernandez-Gonzalez, MD   2 mg at 09/24/15 1527   Or  . LORazepam (ATIVAN) injection 2 mg  2 mg Intramuscular Q6H PRN Jimmy FootmanAndrea Hernandez-Gonzalez, MD      . magnesium hydroxide (MILK OF MAGNESIA) suspension 30 mL  30 mL Oral Daily PRN Jimmy FootmanAndrea Hernandez-Gonzalez, MD      . metFORMIN (GLUCOPHAGE) tablet 1,000 mg  1,000 mg Oral BID WC Jimmy FootmanAndrea Hernandez-Gonzalez, MD   1,000 mg at 09/25/15 1721  . OLANZapine zydis (ZYPREXA) disintegrating tablet 20 mg  20 mg Oral BID Jimmy FootmanAndrea Hernandez-Gonzalez, MD      . simvastatin (ZOCOR) tablet 20 mg  20 mg Oral q1800 Jimmy FootmanAndrea Hernandez-Gonzalez, MD  20 mg at 09/25/15 1721  . traZODone (DESYREL) tablet 100 mg  100 mg Oral QHS Jimmy Footman, MD   100 mg at 09/25/15 2142    Lab Results: No results found for this or any previous visit (from the past 48 hour(s)).  Blood Alcohol level:  Lab Results  Component Value Date   ETH <5 09/16/2015    Physical Findings: AIMS: Facial and Oral Movements Muscles of Facial Expression: None, normal, ,  ,  ,    CIWA:    COWS:     Musculoskeletal: Strength & Muscle Tone: within normal limits Gait & Station: normal Patient leans: N/A  Psychiatric Specialty  Exam: Review of Systems  Unable to perform ROS   There were no vitals taken for this visit.There is no height or weight on file to calculate BMI.  General Appearance: Disheveled  Eye Contact::  Minimal  Speech:  Normal Rate  Volume:  Decreased  Mood:  Anxious  Affect:  Constricted  Thought Process:  Disorganized  Orientation:  NA  Thought Content:  Disorganized thought processes answers are hyper religious  Suicidal Thoughts:  n/a  Homicidal Thoughts:  n/a  Memory:  NA  Judgement:  Impaired  Insight:  Lacking  Psychomotor Activity:  Normal  Concentration:  NA  Recall:  NA  Fund of Knowledge:NA  Language: Fair  Akathisia:  No  Handed:    AIMS (if indicated):     Assets:  Health and safety inspector Housing Social Support  ADL's:  Intact  Cognition: WNL  Sleep:  Number of Hours: 8   Treatment Plan Summary:  Schizophrenia: Continue Zyprexa 20 mg by mouth twice a day. Haldol 5 mg po bid will be added. Now on non emergency forced medications.  Will received zyprexa 10 mg IM if he refuses oral olanzapine.  History of aggression:I will d/c tegretol as appears that main issue is psychosis and don't see a mood component.  Anxiety: Continue clonazepam  Agitation: Continue Ativan 2 mg by mouth or IM every 6 hours as needed  EPS: continue amantadine 100 mg po bid.  Insomnia: Continue trazodone 100 mg by mouth daily at bedtime  Dyslipidemia: Continue Zocor 20 mg by mouth daily  Hypothyroidism: continue syntrhoid 125 mcg/day  Diabetes: Continue Glucophage at thousand milligrams by mouth twice a day  Precautions every 15 minute checks  Diet regular  Hospitalization and status continue involuntary commitment  Disposition: Per social worker apparently patient is able to return temporarily to his current group home  Discharge follow-up continue with ACT services.  Labs: Unable to obtain any labs as patient refuses. Not able to draw lipid panel, hemoglobin A1c, or  prolactin levels.Jimmy Footman, MD 09/26/2015, 9:27 AM

## 2015-09-26 NOTE — [facility] Group Notes (Signed)
[facility] LCSW Group Therapy   09/26/2015 11am  Type of Therapy: Group Therapy   Participation Level: Did Not Attend. Patient invited to participate but declined.    Forbes Loll F. Zenith Lamphier, MSW, LCSWA, LCAS   

## 2015-09-26 NOTE — Progress Notes (Addendum)
Patient ID: [name], male   DOB: 1956/05/31, 60 y.o.   MRN: [id number] Pt does not get along with residents and staff.  Pt is noncompliant with meds and refuses to bathe or allow staff to wash his clothes, or change his bedding.  CSW spoke to the Group Home Manager [name] (cell) [phone] of [facility] and was informed the group home will not allow him to return that they were just "keeping him temporarily.  Pt has a legal guardian [name] of [place] [employer] [phone] / Fax: [phone]). .  CSW is attempting to contact the guardian.

## 2015-09-26 NOTE — NC FL2 (Signed)
[place] MEDICAID FL2 LEVEL OF CARE SCREENING TOOL     IDENTIFICATION  Patient Name: [name] Birthdate: 1956-01-27 Sex: male Admission Date (Current Location): 09/24/2015  [place] and [place] Number:  [name] [id number] [facility] Facility and Address:  [facility], [address] [place], [place] [address]      Provider Number: [id number]  Attending Physician Name and Address:  Barnabas Harries*  Relative Name and Phone Number:  Dorene Sorrow [phone]    Current Level of Care: Hospital Recommended Level of Care: Other (Comment) (Group Home) Prior Approval Number:    Date Approved/Denied:   PASRR Number:    Discharge Plan: Other (Comment) (Group Home)    Current Diagnoses: Patient Active Problem List   Diagnosis Date Noted  . Dyslipidemia 09/25/2015  . Undifferentiated schizophrenia (HCC)   . Diabetes (HCC) 09/16/2015  . Hypothyroid 09/16/2015    Orientation RESPIRATION BLADDER Height & Weight     Self, Time, Place, Situation  Normal Continent Weight:   Height:     BEHAVIORAL SYMPTOMS/MOOD NEUROLOGICAL BOWEL NUTRITION STATUS      Continent Diet (Pt is a diabetic)  AMBULATORY STATUS COMMUNICATION OF NEEDS Skin   Independent Verbally Normal                       Personal Care Assistance Level of Assistance    Bathing Assistance: Independent         Functional Limitations Info             SPECIAL CARE FACTORS FREQUENCY                       Contractures Contractures Info: Not present    Additional Factors Info                  Current Medications (09/26/2015):  This is the current hospital active medication list Current Facility-Administered Medications  Medication Dose Route Frequency Provider Last Rate Last Dose  . acetaminophen (TYLENOL) tablet 650 mg  650 mg Oral Q6H PRN Jimmy Footman, MD      . alum & mag hydroxide-simeth (MAALOX/MYLANTA) 200-200-20 MG/5ML suspension 30 mL   30 mL Oral Q4H PRN Jimmy Footman, MD      . amantadine (SYMMETREL) capsule 100 mg  100 mg Oral BID Jimmy Footman, MD      . clonazePAM Scarlette Calico) disintegrating tablet 0.5 mg  0.5 mg Oral BID Jimmy Footman, MD   0.5 mg at 09/25/15 2142  . haloperidol (HALDOL) 2 MG/ML solution 5 mg  5 mg Oral BID Jimmy Footman, MD      . Melene Muller ON 09/27/2015] levothyroxine (SYNTHROID, LEVOTHROID) tablet 125 mcg  125 mcg Oral QAC breakfast Jimmy Footman, MD      . LORazepam (ATIVAN) tablet 2 mg  2 mg Oral Q6H PRN Jimmy Footman, MD   2 mg at 09/24/15 1527   Or  . LORazepam (ATIVAN) injection 2 mg  2 mg Intramuscular Q6H PRN Jimmy Footman, MD      . magnesium hydroxide (MILK OF MAGNESIA) suspension 30 mL  30 mL Oral Daily PRN Jimmy Footman, MD      . metFORMIN (GLUCOPHAGE) tablet 1,000 mg  1,000 mg Oral BID WC Jimmy Footman, MD   1,000 mg at 09/25/15 1721  . OLANZapine zydis (ZYPREXA) disintegrating tablet 20 mg  20 mg Oral BID Jimmy Footman, MD      . simvastatin (ZOCOR) tablet 20 mg  20 mg Oral q1800 Jimmy Footman,  MD   20 mg at 09/25/15 1721  . traZODone (DESYREL) tablet 100 mg  100 mg Oral QHS Jimmy FootmanAndrea Hernandez-Gonzalez, MD   100 mg at 09/25/15 2142     Discharge Medications: Please see discharge summary for a list of discharge medications.  Relevant Imaging Results:  Relevant Lab Results:   Additional Information Pt was not taking his meds at his former group home and has been admitted in order to stabilize on medications  Dorothe PeaJonathan F Riffey, LCSW

## 2015-09-26 NOTE — Progress Notes (Addendum)
Disoriented to Time, Place and Person, disorganized, religiously preoccupied, requested BP to be obtained in one of his fingers, uncooperative with VS, poor insight, knowledge and judgment, poorly kept appearance, denied pain, denied SI/HI, no aggressive behaviors. Will continue to monitor.

## 2015-09-26 NOTE — Plan of Care (Signed)
Problem: Ineffective individual coping Goal: STG: Patient will remain free from self harm Outcome: Progressing Medications administered as ordered by the physician, medications Therapeutic Effects, SEs and Adverse effects discussed, questions encouraged; no PRN given, 15 minute checks maintained for safety, clinical and moral support provided, patient encouraged to continue to express feelings and demonstrate safe care. Patient remain free from harm, will continue to monitor.         

## 2015-09-26 NOTE — Progress Notes (Signed)
Patient ID: [name], male   DOB: [birth year], 60 y.o.   MRN: [id number] CSW attempted to complete a PSA and initially the pt was agreeable.  However, the pt's speech and thought soon became disorganized and the CSW could not complete the PSA.

## 2015-09-26 NOTE — Progress Notes (Signed)
Recreation Therapy Notes  Date: 03.21.17 Time: 3:10 pm Location: Craft Room  Group Topic: Goal Setting  Goal Area(s) Addresses:  Patient will write down at least one goal. Patient will write down at least one obstacle.  Behavioral Response: Did not attend  Intervention: Recovery Goal Chart  Activity: Patients were instructed to make a Recovery Goal Chart including goals, obstacles, the date the started working on their goal, and the date they achieved their goal.  Education: LRT educated patients on healthy ways to celebrate reaching their goals.  Education Outcome: Patient did not attend group.  Clinical Observations/Feedback: Patient did not attend group.  Jacquelynn CreeGreene,Truly Stankiewicz M, LRT/CTRS 09/26/2015 4:15 PM

## 2015-09-26 NOTE — NC FL2 (Deleted)
[place] MEDICAID FL2 LEVEL OF CARE SCREENING TOOL     IDENTIFICATION  Patient Name: [name] Birthdate: Apr 02, 1956 Sex: male Admission Date (Current Location): 09/24/2015  [facility] [place]      Facility and Address:         Provider Number:    Attending Physician Name and Address:  Barnabas HarriesAndrea Hernandez-Gonzale*  Relative Name and Phone Number:       Current Level of Care:   Recommended Level of Care:   Prior Approval Number:    Date Approved/Denied:   PASRR Number:    Discharge Plan:      Current Diagnoses: Patient Active Problem List   Diagnosis Date Noted  . Dyslipidemia 09/25/2015  . Undifferentiated schizophrenia (HCC)   . Diabetes (HCC) 09/16/2015  . Hypothyroid 09/16/2015    Orientation RESPIRATION BLADDER Height & Weight            Weight:   Height:     BEHAVIORAL SYMPTOMS/MOOD NEUROLOGICAL BOWEL NUTRITION STATUS           AMBULATORY STATUS COMMUNICATION OF NEEDS Skin                               Personal Care Assistance Level of Assistance              Functional Limitations Info             SPECIAL CARE FACTORS FREQUENCY                       Contractures      Additional Factors Info                  Current Medications (09/26/2015):  This is the current hospital active medication list Current Facility-Administered Medications  Medication Dose Route Frequency Provider Last Rate Last Dose  . acetaminophen (TYLENOL) tablet 650 mg  650 mg Oral Q6H PRN Jimmy FootmanAndrea Hernandez-Gonzalez, MD      . alum & mag hydroxide-simeth (MAALOX/MYLANTA) 200-200-20 MG/5ML suspension 30 mL  30 mL Oral Q4H PRN Jimmy FootmanAndrea Hernandez-Gonzalez, MD      . carbamazepine (TEGRETOL) tablet 200 mg  200 mg Oral BID AC Jimmy FootmanAndrea Hernandez-Gonzalez, MD   200 mg at 09/26/15 0641  . clonazePAM (KLONOPIN) disintegrating tablet 0.5 mg  0.5 mg Oral BID Jimmy FootmanAndrea Hernandez-Gonzalez, MD   0.5 mg at 09/25/15 2142  . levothyroxine (SYNTHROID,  LEVOTHROID) tablet 75 mcg  75 mcg Oral QAC breakfast Jimmy FootmanAndrea Hernandez-Gonzalez, MD   75 mcg at 09/26/15 0640  . LORazepam (ATIVAN) tablet 2 mg  2 mg Oral Q6H PRN Jimmy FootmanAndrea Hernandez-Gonzalez, MD   2 mg at 09/24/15 1527   Or  . LORazepam (ATIVAN) injection 2 mg  2 mg Intramuscular Q6H PRN Jimmy FootmanAndrea Hernandez-Gonzalez, MD      . magnesium hydroxide (MILK OF MAGNESIA) suspension 30 mL  30 mL Oral Daily PRN Jimmy FootmanAndrea Hernandez-Gonzalez, MD      . metFORMIN (GLUCOPHAGE) tablet 1,000 mg  1,000 mg Oral BID WC Jimmy FootmanAndrea Hernandez-Gonzalez, MD   1,000 mg at 09/25/15 1721  . OLANZapine zydis (ZYPREXA) disintegrating tablet 20 mg  20 mg Oral BID Jimmy FootmanAndrea Hernandez-Gonzalez, MD      . simvastatin (ZOCOR) tablet 20 mg  20 mg Oral q1800 Jimmy FootmanAndrea Hernandez-Gonzalez, MD   20 mg at 09/25/15 1721  . traZODone (DESYREL) tablet 100 mg  100 mg Oral QHS Jimmy FootmanAndrea Hernandez-Gonzalez, MD   100 mg at  09/25/15 2142     Discharge Medications: Please see discharge summary for a list of discharge medications.  Relevant Imaging Results:  Relevant Lab Results:   Additional Information    Dorothe Pea Deronda Christian, LCSW

## 2015-09-26 NOTE — Progress Notes (Signed)
D: Pt is calm and cooperative this evening. He comes out to the milieu for snack. Pt's thoughts remain disorganized and hyperreligious, stating "God will take care of it". He is unable to answer assessment questions. Speech is incoherent. Pt requires much encouragement to take his medications. A: Emotional support and encouragement provided. Medications administered with education. q15 minute safety checks maintained. R: Pt remains free from harm. Will continue to monitor.

## 2015-09-26 NOTE — Plan of Care (Signed)
Problem: Alteration in thought process Goal: STG-Patient is able to discuss thoughts with staff Outcome: Not Progressing Pt does not answer assessment questions from staff. Says things such as "God will take care of it."  Problem: Ineffective individual coping Goal: STG: Patient will remain free from self harm Outcome: Progressing Pt remains free from harm.

## 2015-09-26 NOTE — Progress Notes (Signed)
Patient has stayed in bed most of day with the sheet over His head, Patient still is actively hallucinating, talks to self and is disorganized, fixated on a check that He supposed to have for 704 dollars, Patient had to be coaxed to take morning medications. Patient will ambulate at times to nursing station and look in window at clock. Patient with q 15 min. Checks, Patient has not been agitated or acting out, Patient does talk calmly in low voice.

## 2015-09-26 NOTE — Progress Notes (Signed)
Recreation Therapy Notes  According to patient's social worker, patient's speech and thoughts are disorganized and patient is not appropriate for assessment at this time.  Jacquelynn CreeGreene,Tomeca Helm M, LRT/CTRS 09/26/2015 4:29 PM

## 2015-09-27 MED ORDER — LEVOTHYROXINE SODIUM 125 MCG PO TABS
125.0000 ug | ORAL_TABLET | Freq: Every day | ORAL | Status: DC
Start: 2015-09-27 — End: 2015-10-10
  Administered 2015-09-27 – 2015-10-10 (×14): 125 ug via ORAL
  Filled 2015-09-27 (×14): qty 1

## 2015-09-27 NOTE — Progress Notes (Signed)
D: Patient lying in bed with covers over his head.  Requested patient to remove sheets so he could take his medications.  He refused at first stating, "go ahead and give them to me."  Again, requested patient to remove his head covering.  He finally did and glared at staff, "go ahead, I'm ready to take them."  Patient was compliant with his medication.  He is irritable with intense glare.  He has sullen affect. A: Continue to monitor medication management and MD orders.  Safety checks completed every 15 minutes per protocol.  Offer support and encouragement as needed. R: Patient is resistant to treatment.

## 2015-09-27 NOTE — [facility] Group Notes (Signed)
[facility] Group Notes:  (Nursing/MHT/Case Management/Adjunct)  Date:  09/27/2015  Time:  2:20 PM  Type of Therapy:  Psychoeducational Skills  Participation Level:  Did Not Attend   Gregory SmokeCara [name] [name] 09/27/2015, 2:20 PM

## 2015-09-27 NOTE — [facility] Group Notes (Signed)
[facility] Group Notes:  (Nursing/MHT/Case Management/Adjunct)  Date:  09/27/2015  Time:  9:53 PM  Type of Therapy:  Evening Wrap-up Group  Participation Level:  Did Not Attend  Participation Quality:  Did Not Attend  Affect:  N/A  Cognitive:  N/A  Insight:  None  Engagement in Group:  N/A  Modes of Intervention:  N/A  Summary of Progress/Problems:  Tomasita MorrowChelsea Nanta Marquavious Nazar 09/27/2015, 9:53 PM

## 2015-09-27 NOTE — Progress Notes (Addendum)
Observed in the Day Room 2 with crayons and scribbling in the papers, refused to say anything, disengaged from others, disheveled, unkept, wearing layers of clothes; blank affect, seriously trying to focus to concentrate on putting thoughts in "the project" that his working on. No aggressive behavior, will monitor.

## 2015-09-27 NOTE — Plan of Care (Signed)
Problem: Alteration in thought process Goal: STG-Patient is able to discuss thoughts with staff Outcome: Not Progressing Patient has disorganized thought processes.

## 2015-09-27 NOTE — [facility] Group Notes (Signed)
[facility] LCSW Aftercare Discharge Planning Group Note  09/27/2015 9:30 AM  Participation Quality: Did Not Attend. Patient invited to participate but declined.   Natahlia Hoggard F. Madison Direnzo, MSW, LCSWA, LCAS   

## 2015-09-27 NOTE — Progress Notes (Signed)
[facility] MD Progress Note  09/27/2015 2:08 PM [name]  MRN:  [id number] Subjective:  Refusing meds yesterday and today but eventually took them with encouragement.  As started on non-emergency forced medications yesterday.  Patient is unable to provide information in order to complete a psychiatric review of systems. He is unable to tell me if medications are helping him or if he is having any side effects. He is unable to tell me he has any physical complaints. This patient has severe refractory schizophrenia. His thought processes disorganized. Over the last 2 days he seems to be more talkative however the content of his speech is irrelevant to the questions he is fixated on getting his check for $740.  He today he also mentioned that he was, play basketball a couple of years ago with a team from [place].  Per nursing: D: Patient lying in bed with covers over his head. Requested patient to remove sheets so he could take his medications. He refused at first stating, "go ahead and give them to me." Again, requested patient to remove his head covering. He finally did and glared at staff, "go ahead, I'm ready to take them." Patient was compliant with his medication. He is irritable with intense glare. He has sullen affect. A: Continue to monitor medication management and MD orders. Safety checks completed every 15 minutes per protocol. Offer support and encouragement as needed. R: Patient is resistant to treatment.  Principal Problem: Undifferentiated schizophrenia (HCC) Diagnosis:   Patient Active Problem List   Diagnosis Date Noted  . Dyslipidemia [E78.5] 09/25/2015  . Undifferentiated schizophrenia (HCC) [F20.3]   . Diabetes (HCC) [E11.9] 09/16/2015  . Hypothyroid [E03.9] 09/16/2015   Total Time spent with patient: 30 minutes    Past Medical History:  Past Medical History  Diagnosis Date  . Schizophrenia (HCC)   . Diabetes mellitus without complication (HCC)   . Thyroid  disease   . Hyperlipemia    History reviewed. No pertinent past surgical history.  Social History:  History  Alcohol Use: Not on file     History  Drug Use Not on file    Social History   Social History  . Marital Status: Single    Spouse Name: N/A  . Number of Children: N/A  . Years of Education: N/A   Social History Main Topics  . Smoking status: Unknown If Ever Smoked  . Smokeless tobacco: None  . Alcohol Use: None  . Drug Use: None  . Sexual Activity: Not Asked   Other Topics Concern  . None   Social History Narrative      Current Medications: Current Facility-Administered Medications  Medication Dose Route Frequency Provider Last Rate Last Dose  . acetaminophen (TYLENOL) tablet 650 mg  650 mg Oral Q6H PRN Jimmy Footman, MD      . alum & mag hydroxide-simeth (MAALOX/MYLANTA) 200-200-20 MG/5ML suspension 30 mL  30 mL Oral Q4H PRN Jimmy Footman, MD      . amantadine (SYMMETREL) capsule 100 mg  100 mg Oral BID Jimmy Footman, MD   100 mg at 09/27/15 0901  . clonazePAM (KLONOPIN) disintegrating tablet 0.5 mg  0.5 mg Oral BID Jimmy Footman, MD   0.5 mg at 09/27/15 0900  . levothyroxine (SYNTHROID, LEVOTHROID) tablet 125 mcg  125 mcg Oral QAC breakfast Jimmy Footman, MD   125 mcg at 09/27/15 [phone]  . LORazepam (ATIVAN) tablet 2 mg  2 mg Oral Q6H PRN Jimmy Footman, MD   2 mg at 09/24/15 1527  Or  . LORazepam (ATIVAN) injection 2 mg  2 mg Intramuscular Q6H PRN Jimmy FootmanAndrea Hernandez-Gonzalez, MD      . magnesium hydroxide (MILK OF MAGNESIA) suspension 30 mL  30 mL Oral Daily PRN Jimmy FootmanAndrea Hernandez-Gonzalez, MD      . metFORMIN (GLUCOPHAGE) tablet 1,000 mg  1,000 mg Oral BID WC Jimmy FootmanAndrea Hernandez-Gonzalez, MD   1,000 mg at 09/27/15 0900  . OLANZapine (ZYPREXA) injection 10 mg  10 mg Intramuscular BID Jimmy FootmanAndrea Hernandez-Gonzalez, MD   10 mg at 09/26/15 1039  . OLANZapine (ZYPREXA) tablet 20 mg  20 mg Oral BID Jimmy FootmanAndrea  Hernandez-Gonzalez, MD   20 mg at 09/27/15 0900  . simvastatin (ZOCOR) tablet 20 mg  20 mg Oral q1800 Jimmy FootmanAndrea Hernandez-Gonzalez, MD   20 mg at 09/26/15 1757  . traZODone (DESYREL) tablet 100 mg  100 mg Oral QHS Jimmy FootmanAndrea Hernandez-Gonzalez, MD   100 mg at 09/26/15 2139    Lab Results: No results found for this or any previous visit (from the past 48 hour(s)).  Blood Alcohol level:  Lab Results  Component Value Date   ETH <5 09/16/2015    Physical Findings: AIMS: Facial and Oral Movements Muscles of Facial Expression: None, normal, ,  ,  ,    CIWA:    COWS:     Musculoskeletal: Strength & Muscle Tone: within normal limits Gait & Station: normal Patient leans: N/A  Psychiatric Specialty Exam: Review of Systems  Unable to perform ROS   There were no vitals taken for this visit.There is no height or weight on file to calculate BMI.  General Appearance: Disheveled  Eye Contact::  Minimal  Speech:  Normal Rate  Volume:  Decreased  Mood:  Anxious  Affect:  Constricted  Thought Process:  Disorganized  Orientation:  NA  Thought Content:  Disorganized thought processes answers are hyper religious  Suicidal Thoughts:  n/a  Homicidal Thoughts:  n/a  Memory:  NA  Judgement:  Impaired  Insight:  Lacking  Psychomotor Activity:  Normal  Concentration:  NA  Recall:  NA  Fund of Knowledge:NA  Language: Fair  Akathisia:  No  Handed:    AIMS (if indicated):     Assets:  Financial Resources/Insurance Housing Social Support  ADL's:  Intact  Cognition: WNL  Sleep:  Number of Hours: 7.15   Treatment Plan Summary:  Schizophrenia: Continue Zyprexa 20 mg by mouth twice a day along with Haldol 5 mg po bid. On non emergency forced medications since 3/21.  Will received zyprexa 10 mg IM if he refuses oral olanzapine.  Yesterday he refused all oral medications in the morning. Eventually he agreed with taking medications with ice cream.  I will try today to contact [facility] in order to get  records from last hospitalization.  History of aggression: I will d/c tegretol as appears that main issue is psychosis and don't see a mood component.  Anxiety: Continue clonazepam  Agitation: Continue Ativan 2 mg by mouth or IM every 6 hours as needed  EPS: continue amantadine 100 mg po bid.  Insomnia: Continue trazodone 100 mg by mouth daily at bedtime  Dyslipidemia: Continue Zocor 20 mg by mouth daily  Hypothyroidism: continue syntrhoid 125 mcg/day  Diabetes: Continue Glucophage at thousand milligrams by mouth twice a day  Precautions every 15 minute checks  Diet regular  Hospitalization and status continue involuntary commitment  Disposition: Per Child psychotherapistsocial worker he is not able to return to prior placement which was an ALF. SW looking for new placement.  Discharge follow-up continue with ACT services.  Labs: Unable to obtain any labs as patient refuses. Not able to draw lipid panel, hemoglobin A1c, or prolactin levels.Jimmy Footman, MD 09/27/2015, 2:08 PM

## 2015-09-27 NOTE — Progress Notes (Signed)
Patient ID: [name], male   DOB: 09/27/1955, 60 y.o.   MRN: [id number] CSW attempted to complete a PSA on the pt but was unable to receive responses to the CSW's questions and prompts, as the pt's thought and speech were disorganized.

## 2015-09-27 NOTE — Progress Notes (Signed)
D:  [name] noted sitting in hall with sitter playing cards.

## 2015-09-27 NOTE — Progress Notes (Signed)
Recreation Therapy Notes  According to patient's social worker, patient's thoughts and speech are still disorganized and patient is not appropriate for assessment at this time.  [name],[name], LRT/CTRS 09/27/2015 4:18 PM

## 2015-09-27 NOTE — [facility] Group Notes (Signed)
[facility] LCSW Group Therapy   09/27/2015 1pm  Type of Therapy: Group Therapy   Participation Level: Did Not Attend. Patient invited to participate but declined.    Delbert Darley F. Auriel Kist, MSW, LCSWA, LCAS   

## 2015-09-27 NOTE — [facility] Group Notes (Signed)
[facility] Group Notes:  (Nursing/MHT/Case Management/Adjunct)  Date:  09/27/2015  Time:  1:28 AM  Type of Therapy:  Group Therapy  Participation Level:  Did Not Attend   Summary of Progress/Problems:  [name] 09/27/2015, 1:28 AM

## 2015-09-27 NOTE — Progress Notes (Signed)
Recreation Therapy Notes  Date: 03.22.17 Time: 1:00 pm Location: Craft Room  Group Topic: Self-esteem  Goal Area(s) Addresses:  Patient will write at least one positive trait about self. Patient will verbalize benefit of having a healthy self-esteem.  Behavioral Response: Did not attend  Intervention: I Am  Activity: Patients were given a worksheet with the letter I on it and instructed to write as many positive traits about themselves inside the letter I.  Education: LRT educated patients on ways they can increase their self-esteem.  Education Outcome: Patient did not attend group.   Clinical Observations/Feedback: Patient did not attend group.  Jacquelynn CreeGreene,Jasani Lengel M, LRT/CTRS 09/27/2015 3:00 PM

## 2015-09-27 NOTE — Progress Notes (Signed)
Patient ID: [name], male   DOB: 1955-11-29, 60 y.o.   MRN: [id number] [facility] wh[facility] said they could come pick him up on Friday after meeting with them and the CSW faxed the FL-2 (unsigned) to [name] (the owner's daughter) at [phone].

## 2015-09-28 MED ORDER — CLONAZEPAM 0.5 MG PO TABS
0.5000 mg | ORAL_TABLET | Freq: Two times a day (BID) | ORAL | Status: DC
  Administered 2015-09-28 – 2015-10-03 (×10): 0.5 mg via ORAL
  Filled 2015-09-28 (×10): qty 1

## 2015-09-28 MED ORDER — HALOPERIDOL 5 MG PO TABS
5.0000 mg | ORAL_TABLET | Freq: Two times a day (BID) | ORAL | Status: DC
  Administered 2015-09-28 – 2015-09-29 (×2): 5 mg via ORAL
  Filled 2015-09-28 (×2): qty 1

## 2015-09-28 MED ORDER — HALOPERIDOL 5 MG PO TABS: 5.0000 mg | ORAL_TABLET | Freq: Two times a day (BID) | ORAL | Status: DC

## 2015-09-28 NOTE — Plan of Care (Signed)
Problem: Ineffective individual coping Goal: STG: Patient will remain free from self harm Outcome: Progressing Medications administered as ordered by the physician, medications Therapeutic Effects, SEs and Adverse effects discussed, questions encouraged; no PRN given, 15 minute checks maintained for safety, clinical and moral support provided, patient encouraged to continue to express feelings and demonstrate safe care. Patient remain free from harm, will continue to monitor.         

## 2015-09-28 NOTE — Progress Notes (Addendum)
Patient ID: [name], male   DOB: 02/13/1956, 60 y.o.   MRN: [id number] From 3/21 per CSW's progress note: Pt does not get along with residents and staff. Pt is noncompliant with meds and refuses to bathe or allow staff to wash his clothes, or change his bedding. CSW spoke to the Group Home Manager Kathaleen BuryJanis Reeves (cell) [phone] of [facility] and was informed the group home will not allow him to return that they were just "keeping him temporarily. Pt has a legal guardian Lazaro ArmsKay Flack of [place] DSS [phone] / Fax: [phone]). . CSW is attempting to contact the guardian.  From 3/22 per CSW's progress note: [facility] who said they could come pick him up on Friday after meeting with them and the CSW faxed the FL-2 (unsigned) to [name] (the owner's daughter) at [phone].  09/28/15: CSW called [facility]'s [facility], due top the [place] and spoke to the [facility]  [facility]'s "Department of Justice TCLI Coordinator" (TCLI is an acronym for Transition To Living Initiative) Paulette Walker (similar to Care Coordinator), but the pt has not been assigned a specific Care Coordinator as of yet.  Paulette Walker informed the CSW that she will call her supervisor "CarMaxlisha Browser" and arrange for this to be done.   CSW reported "[facility] for leaving the pt at the emergency room due (Per ED notes) "Concerns over his physical and mental health" due to the pt being meds noncompliant and for refusing to bathe or wash his clothes and not due to any behavioral or aggression issues.  CSW also informed Miss Dan HumphreysWalker that the ALF refuses to take him back without notice.  CSW is attempting to access [facility] the pt's most recent stay in order to assist Dr. Ardyth HarpsHernandez in her treatment of the pt.  CSw is also attempting to gain  access to the pt's legal guardianship papers from the pt's legal guardian Lazaro ArmsKay Flack of [place] DSS [phone] / Fax: [phone]).  Addendum 09/28/15: Pt is a client of [facility] who stated they were under the impression that [facility].  [facility]'s Alysha at [phone] said she will fax the CSW his recent meds list and miscel. records of tx for the pt by [facility].  Pt's DSS Legal Guardian is sending the CSW the most recent [facility] summary [facility], as well as a signed consent via email.

## 2015-09-28 NOTE — Progress Notes (Signed)
Recreation Therapy Notes  Date: 03.23.17 Time: 3:00 pm Location: Craft Room  Group Topic: Leisure Education  Goal Area(s) Addresses:  Patient will identify activities for each letter of the alphabet. Patient will verbalize ability to integrate positive leisure into life post d/c. Patient will verbalize ability to use leisure as a Associate Professorcoping skill.  Behavioral Response: Did not attend  Intervention: Leisure Alphabet  Activity: Patients were given a Leisure Information systems managerAlphabet worksheet and instructed to write a healthy leisure activity for each letter of the alphabet.  Education: LRT educated patients on what they need to participate in leisure.  Education Outcome: Patient did not attend group.  Clinical Observations/Feedback: Patient did not attend group.  Jacquelynn CreeGreene,Orman Matsumura M, LRT/CTRS 09/28/2015 4:19 PM

## 2015-09-28 NOTE — [facility] Group Notes (Signed)
[facility] Group Notes:  (Nursing/MHT/Case Management/Adjunct)  Date:  09/28/2015  Time:  1:56 PM  Type of Therapy:  Group Therapy  Participation Level:  Did Not Attend  [name] 09/28/2015, 1:56 PM 

## 2015-09-28 NOTE — Progress Notes (Signed)
D.Patient  Compliant  with medication  Given this shift . Patient remained odd and bizzarre . Isolates to himself. No eye contact. No unit participation  this  Shift .  No ADL's  performed this shift continues to wear the same clothing .  Patient caring around a number  Of   Paper plates tops  With witting on them  Appetite  Good  A: Encourage patient participation with unit programming . Instruction  Given on  Medication , verbalize understanding. R: Voice no other concerns. Staff continue to monitor

## 2015-09-28 NOTE — Plan of Care (Signed)
Problem: Ineffective individual coping Goal: LTG: Patient will report a decrease in negative feelings Outcome: Progressing Appropriate behavior, limited insight into behavior

## 2015-09-28 NOTE — Progress Notes (Signed)
[facility]  09/28/2015 9:45 AM [name]  MRN:  [id number] Subjective:  Per nursing he comply with medications this morning. All medications were given crushed and mixed with applesauce.  Patient is unable to provide information in order to complete a psychiatric review of systems. He is unable to tell me if medications are helping him or if he is having any side effects. He is unable to tell me he has any physical complaints. This patient has severe refractory schizophrenia. His thought processes disorganized. Over the last 2 days he seems to be more talkative however the content of his speech is irrelevant to the questions he is fixated on getting his check for $740.  Today the patient was mute.  Per nursing: Observed in the Day Room 2 with crayons and scribbling in the papers, refused to say anything, disengaged from others, disheveled, unkept, wearing layers of clothes; blank affect, seriously trying to focus to concentrate on putting thoughts in "the project" that his working on. No aggressive behavior, will monitor.  Principal Problem: Undifferentiated schizophrenia (HCC) Diagnosis:   Patient Active Problem List   Diagnosis Date Noted  . Dyslipidemia [E78.5] 09/25/2015  . Undifferentiated schizophrenia (HCC) [F20.3]   . Diabetes (HCC) [E11.9] 09/16/2015  . Hypothyroid [E03.9] 09/16/2015   Total Time spent with patient: 30 minutes    Past Medical History:  Past Medical History  Diagnosis Date  . Schizophrenia (HCC)   . Diabetes mellitus without complication (HCC)   . Thyroid disease   . Hyperlipemia    History reviewed. No pertinent past surgical history.  Social History:  History  Alcohol Use: Not on file     History  Drug Use Not on file    Social History   Social History  . Marital Status: Single    Spouse Name: N/A  . Number of Children: N/A  . Years of Education: N/A   Social History Main Topics  . Smoking status: Unknown If Ever Smoked  .  Smokeless tobacco: None  . Alcohol Use: None  . Drug Use: None  . Sexual Activity: Not Asked   Other Topics Concern  . None   Social History Narrative      Current Medications: Current Facility-Administered Medications  Medication Dose Route Frequency Provider Last Rate Last Dose  . acetaminophen (TYLENOL) tablet 650 mg  650 mg Oral Q6H PRN Jimmy FootmanAndrea Hernandez-Gonzalez, MD      . alum & mag hydroxide-simeth (MAALOX/MYLANTA) 200-200-20 MG/5ML suspension 30 mL  30 mL Oral Q4H PRN Jimmy FootmanAndrea Hernandez-Gonzalez, MD      . amantadine (SYMMETREL) capsule 100 mg  100 mg Oral BID Jimmy FootmanAndrea Hernandez-Gonzalez, MD   100 mg at 09/28/15 0807  . clonazePAM (KLONOPIN) tablet 0.5 mg  0.5 mg Oral BID Jimmy FootmanAndrea Hernandez-Gonzalez, MD      . haloperidol (HALDOL) tablet 5 mg  5 mg Oral BID Jimmy FootmanAndrea Hernandez-Gonzalez, MD      . levothyroxine (SYNTHROID, LEVOTHROID) tablet 125 mcg  125 mcg Oral QAC breakfast Jimmy FootmanAndrea Hernandez-Gonzalez, MD   125 mcg at 09/28/15 [phone]  . LORazepam (ATIVAN) tablet 2 mg  2 mg Oral Q6H PRN Jimmy FootmanAndrea Hernandez-Gonzalez, MD   2 mg at 09/24/15 1527   Or  . LORazepam (ATIVAN) injection 2 mg  2 mg Intramuscular Q6H PRN Jimmy FootmanAndrea Hernandez-Gonzalez, MD      . magnesium hydroxide (MILK OF MAGNESIA) suspension 30 mL  30 mL Oral Daily PRN Jimmy FootmanAndrea Hernandez-Gonzalez, MD      . metFORMIN (GLUCOPHAGE) tablet 1,000 mg  1,000 mg Oral BID WC Jimmy Footman, MD   1,000 mg at 09/28/15 0802  . OLANZapine (ZYPREXA) injection 10 mg  10 mg Intramuscular BID Jimmy Footman, MD   10 mg at 09/26/15 1039  . OLANZapine (ZYPREXA) tablet 20 mg  20 mg Oral BID Jimmy Footman, MD   20 mg at 09/28/15 0802  . simvastatin (ZOCOR) tablet 20 mg  20 mg Oral q1800 Jimmy Footman, MD   20 mg at 09/27/15 1654  . traZODone (DESYREL) tablet 100 mg  100 mg Oral QHS Jimmy Footman, MD   100 mg at 09/27/15 2205    Lab Results: No results found for this or any previous visit (from the  past 48 hour(s)).  Blood Alcohol level:  Lab Results  Component Value Date   ETH <5 09/16/2015    Physical Findings: AIMS: Facial and Oral Movements Muscles of Facial Expression: None, normal, ,  ,  ,    CIWA:    COWS:     Musculoskeletal: Strength & Muscle Tone: within normal limits Gait & Station: normal Patient leans: N/A  Psychiatric Specialty Exam: Review of Systems  Unable to perform ROS   There were no vitals taken for this visit.There is no height or weight on file to calculate BMI.  General Appearance: Disheveled  Eye Contact::  Minimal  Speech:  Normal Rate  Volume:  Decreased  Mood:  Anxious  Affect:  Constricted  Thought Process:  Disorganized  Orientation:  NA  Thought Content:  Disorganized thought processes answers are hyper religious  Suicidal Thoughts:  n/a  Homicidal Thoughts:  n/a  Memory:  NA  Judgement:  Impaired  Insight:  Lacking  Psychomotor Activity:  Normal  Concentration:  NA  Recall:  NA  Fund of Knowledge:NA  Language: Fair  Akathisia:  No  Handed:    AIMS (if indicated):     Assets:  Financial Resources/Insurance Housing Social Support  ADL's:  Intact  Cognition: WNL  Sleep:  Number of Hours: 7.15   Treatment Plan Summary:  Patient was mute today.  Continue to give all oral medications crushed and mixed with apple sauce  Schizophrenia: Continue Zyprexa 20 mg by mouth twice a day along with Haldol 5 mg po bid. On non emergency forced medications since 3/21.  Will received zyprexa 10 mg IM if he refuses oral olanzapine.  [facility]  Anxiety: Continue clonazepam 0.5 mg po bid  Agitation: Continue Ativan 2 mg by mouth or IM every 6 hours as needed  EPS: continue amantadine 100 mg po bid   Insomnia: Continue trazodone 100 mg by mouth daily at bedtime  Dyslipidemia: Continue Zocor 20 mg by mouth daily  Hypothyroidism: continue syntrhoid 125 mcg/day  Diabetes: Continue Glucophage  at thousand milligrams by mouth twice a day  Precautions every 15 minute checks  Diet regular  Hospitalization and status continue involuntary commitment  Disposition: Per Child psychotherapist he is not able to return to prior placement which was an ALF. SW found new GH.   Discharge follow-up continue with ACT services.  Labs: Unable to obtain any labs as patient refuses. Not able to draw lipid panel, hemoglobin A1c, or prolactin levels.Jimmy Footman, MD 09/28/2015, 9:45 AM

## 2015-09-28 NOTE — [facility] Group Notes (Signed)
[facility] LCSW Group Therapy   09/28/2015 11am  Type of Therapy: Group Therapy   Participation Level: Did Not Attend. Patient invited to participate but declined.    Ngoc Detjen F. Sheneka Schrom, MSW, LCSWA, LCAS   

## 2015-09-28 NOTE — Tx Team (Signed)
Interdisciplinary Treatment Plan Update (Adult)        Date: 09/28/2015   Time Reviewed: 9:30 AM   Progress in Treatment: Improving  Attending groups: No Participating in groups: No Taking medication as prescribed: Yes  Tolerating medication: Yes  Family/Significant other contact made: Yes, CSW has spoken to the pt's group home and the pt's DSS social worker Marga Melnick Patient understands diagnosis: Yes  Discussing patient identified problems/goals with staff: Yes  Medical problems stabilized or resolved: Yes  Denies suicidal/homicidal ideation: Yes  Issues/concerns per patient self-inventory: Yes  Other:   New problem(s) identified: N/A   Discharge Plan or Barriers: Pt will be [facility] into [facility] upon discharge   Reason for Continuation of Hospitalization:   Depression   Anxiety   Medication Stabilization   Comments: N/A   Estimated length of stay: 3-5 days             Pt is a 60 year rold male arrived to our ER on 3/11 and was admitted due to concerns of his group home for his mental and physical health.  Pt lives in [place].  [name] is a 6'6" tall, 230 lbs 60 y.o., African American male, who presents to the ER due to his Dresser having concerns about his physical and mental health. Majority of the information gathered for this assessment was from patient Medical chart and Group Home Staff.  Patient has a history of stopping his medications and being religious preoccupied.  According to the Group Home, 3 or 4 days prior to coming here the patient show more severe psychotic symptoms. He only has been eating once a day and has been refusing medications. Group Home staff states, they are unsure how long he has gone without his medications. They found some of his medications in his room, when they were cleaning it. Patient usually take his meds in front of staff and is also required to check his mouth. However, he has learned how to hide the pills in his mouth, so staff  can't see them. When he goes to his room, he hides them in his closet. Patient goes in his closet and sit there for several hours "mumbling to his self." In the past, patient was going to his closet to prayer, "because that is what Jesus said in the scriptures."  Patient hygiene has decrease. Patient has refused to bathe and has had on the same clothes for several days. Patient refused to take them off and allow Group Home staff wash them. Patient is currently disheveled and have a strong odor coming from his person.   Upon arrival to the ER it took several staff members to restrain him to get lab work. They were unsuccessful with having him change his clothing.  His labs revealed hypernatremia his sodium was 151. He was in acute renal failure.  Creatinine was 1.52 and his GFR was 56 and BUN 46.  Test results are likely due to dehydration and poor oral intake.  Due to his high level of agitation patient was placed on the waiting list from [facility] where he has been hospitalized a multitude of times in the past. The patient was in our emergency department awaiting for bed there from 3/11 to 3/19.  File decision was made to hospitalize him yesterday after having several days of no aggression, no agitation. The patient per nursing in the emergency room, has been compliant with all his oral medications and has been eating and drinking with no  difficulties. Patient has been refusing daily vitals.    Laboratory work was repeated on March 14. Showing resolution of hypernatremia and acute renal insufficiency.  Patient is currently living at "[phone])." He moved in 04/2015, from [facility] Group Home staff was unsure from what hospital he discharged from.  [place], is his Guardian Marga [phone]).  Patient currently receives Outpatient Treatment with [facility] [phone]), with their  ACT Team.  Per Dr's notes: I saw this patient today in 2 days prior to this admission while he was in the emergency room. His answers to my questions are irrelevant. Most of the statements he made our about God and God will. He is unable to complete a review of systems form psychiatric and nonpsychiatric symptoms.  Substance abuse history: No known history of substance abuse.  Patient and CSW reviewed pt's identified goals and treatment plan. Pt verbalized understanding and agreed to treatment plan.        Review of initial/current patient goals per problem list:  1. Goal(s): Patient will participate in aftercare plan   Met: No  Target date: 3-5 days post admission date   As evidenced by: Patient will participate within aftercare plan AEB aftercare provider and housing plan at discharge being identified.   3/21: CSW still assessing for appropriate contacts  3/23: Pt will be admitted into [facility] upon discharge     2. Goal (s): Patient will exhibit decreased depressive symptoms and suicidal ideations.   Met: No  Target date: 3-5 days post admission date   As evidenced by: Patient will utilize self-rating of depression at 3 or below and demonstrate decreased signs of depression or be deemed stable for discharge by MD.   3/21: Goal progressing.  3/23: Goal progressing.    3. Goal(s): Patient will demonstrate decreased signs and symptoms of anxiety.   Met: No  Target date: 3-5 days post admission date   As evidenced by: Patient will utilize self-rating of anxiety at 3 or below and demonstrated decreased signs of anxiety, or be deemed stable for discharge by MD   3/21: Goal progressing  3/23: Goal progressing.    4. Goal(s): Patient will demonstrate decreased signs of psychosis  * Met: No * Target date: 3-5 days post admission date  * As evidenced by: Patient will demonstrate decreased frequency of AVH or return to baseline function   3/21: Goal progressing.  3/23: Goal  progressing.    Attendees:  Patient:  Family:  Physician: Dr. Jerilee Hoh, MD     09/28/2015 9:30 AM  Nursing: Mechele Claude, RN      09/28/2015 9:30 AM  Clinical Social Worker: Marylou Flesher, [place]   09/28/2015 9:30 AM  Clinical Social Worker:     09/28/2015 9:30 AM  Clinical Social Worker:     09/28/2015 9:30 AM  Nursing: Carolynn Sayers, RN     09/28/2015 9:30 AM  Nursing:  Polly Cobia , RN     09/28/2015 9:30 AM

## 2015-09-29 ENCOUNTER — Encounter: Payer: Self-pay | Admitting: Psychiatry

## 2015-09-29 MED ORDER — ASPIRIN 81 MG PO CHEW
81.0000 mg | CHEWABLE_TABLET | Freq: Every day | ORAL | Status: DC
  Administered 2015-09-30 – 2015-10-03 (×4): 81 mg via ORAL
  Filled 2015-09-29 (×4): qty 1

## 2015-09-29 MED ORDER — HALOPERIDOL 5 MG PO TABS
5.0000 mg | ORAL_TABLET | Freq: Every day | ORAL | Status: DC
Start: 2015-09-30 — End: 2015-10-03
  Administered 2015-09-30 – 2015-10-03 (×4): 5 mg via ORAL
  Filled 2015-09-29 (×5): qty 1

## 2015-09-29 MED ORDER — HALOPERIDOL 5 MG PO TABS
10.0000 mg | ORAL_TABLET | Freq: Every day | ORAL | Status: DC
  Administered 2015-09-29 – 2015-10-02 (×4): 10 mg via ORAL
  Filled 2015-09-29 (×3): qty 2

## 2015-09-29 NOTE — Plan of Care (Signed)
Problem: Ineffective individual coping Goal: STG: Patient will remain free from self harm Outcome: Progressing Medications administered as ordered by the physician, medications Therapeutic Effects, SEs and Adverse effects discussed, questions encouraged; no PRN given, 15 minute checks maintained for safety, clinical and moral support provided, patient encouraged to continue to express feelings and demonstrate safe care. Patient remain free from harm, will continue to monitor.         

## 2015-09-29 NOTE — Plan of Care (Signed)
Problem: Alteration in thought process Goal: LTG-Patient verbalizes understanding importance med regimen (Patient verbalizes understanding of importance of medication regimen and need to continue outpatient care.)  Outcome: Progressing Compliant with medications.     

## 2015-09-29 NOTE — Progress Notes (Signed)
Patient affect is little brighter.While taking medicine he states" God heals."Then he took the pills.Does not give relevant answers to any questions.Did not do any ADLs.Isolated in room.No interactions with peers.Appetite good.Did not attend groups.

## 2015-09-29 NOTE — [facility] Group Notes (Signed)
[facility] LCSW Aftercare Discharge Planning Group Note  09/29/2015 9:30 AM  Participation Quality: Did Not Attend. Patient invited to participate but declined.   [name] F. Yesica Kemler, MSW, LCSWA, LCAS   

## 2015-09-29 NOTE — [facility] Group Notes (Signed)
[facility] Group Notes:  (Nursing/MHT/Case Management/Adjunct)  Date:  09/29/2015  Time:  11:55 AM  Type of Therapy:  Movement Therapy  Participation Level:  Did Not Attend  Emily Massar De'Chelle Tivon Lemoine 09/29/2015, 11:55 AM 

## 2015-09-29 NOTE — Progress Notes (Addendum)
Patient stayed in the Day Room after snacks and watched basket ball game; quiet, confused, still disorganized with elective mutism, thought blocking, cognitive delayed response, perplexed, word searching, "God will help.."; poorly kept appearance; medication compliant.

## 2015-09-29 NOTE — Progress Notes (Signed)
[facility]  09/29/2015 12:07 PM Charlesetta IvoryFinley Signor  MRN:  [id number] Subjective:  Per nursing he comply with medications this morning. All medications were given  mixed with applesauce.  Patient is unable to provide information in order to complete a psychiatric review of systems. He is unable to tell me if medications are helping him or if he is having any side effects. He is unable to tell me he has any physical complaints. This patient has severe refractory schizophrenia. His thought processes disorganized. Over the last couple of days he seems to be more talkative however the content of his speech is irrelevant to the questions he is fixated on getting his check for $740.  Today the patient was more talkative but still fixated on this money that he says he should be getting from his mother's retirement.  Per nursing: D.Patient Compliant with medication Given this shift . Patient remained odd and bizzarre . Isolates to himself. No eye contact. No unit participation this Shift . No ADL's performed this shift continues to wear the same clothing . Patient caring around a number Of Paper plates tops With witting on them Appetite Good  A: Encourage patient participation with unit programming . Instruction Given on Medication , verbalize understanding. R: Voice no other concerns. Staff continue to monitor          Principal Problem: Undifferentiated schizophrenia (HCC) Diagnosis:   Patient Active Problem List   Diagnosis Date Noted  . Dyslipidemia [E78.5] 09/25/2015  . Undifferentiated schizophrenia (HCC) [F20.3]   . Diabetes (HCC) [E11.9] 09/16/2015  . Hypothyroid [E03.9] 09/16/2015   Total Time spent with patient: 30 minutes    Past Medical History:  Past Medical History  Diagnosis Date  . Schizophrenia (HCC)   . Diabetes mellitus without complication (HCC)   . Thyroid disease   . Hyperlipemia   . Sickle cell trait (HCC)   . HTN (hypertension)   . TIA  (transient ischemic attack) in 2009  . Seizure (HCC) While toxic on Depakote  . Lithium toxicity   . Neutropenia (HCC)   . Hyperammonemia (HCC) While on Depakote   History reviewed. No pertinent past surgical history.  Social History:  History  Alcohol Use: Not on file     History  Drug Use Not on file    Social History   Social History  . Marital Status: Single    Spouse Name: N/A  . Number of Children: N/A  . Years of Education: N/A   Social History Main Topics  . Smoking status: Unknown If Ever Smoked  . Smokeless tobacco: None  . Alcohol Use: None  . Drug Use: None  . Sexual Activity: Not Asked   Other Topics Concern  . None   Social History Narrative      Current Medications: Current Facility-Administered Medications  Medication Dose Route Frequency Provider Last Rate Last Dose  . acetaminophen (TYLENOL) tablet 650 mg  650 mg Oral Q6H PRN Jimmy FootmanAndrea Hernandez-Gonzalez, MD      . alum & mag hydroxide-simeth (MAALOX/MYLANTA) 200-200-20 MG/5ML suspension 30 mL  30 mL Oral Q4H PRN Jimmy FootmanAndrea Hernandez-Gonzalez, MD      . amantadine (SYMMETREL) capsule 100 mg  100 mg Oral BID Jimmy FootmanAndrea Hernandez-Gonzalez, MD   100 mg at 09/29/15 0907  . [START ON 09/30/2015] aspirin chewable tablet 81 mg  81 mg Oral Daily Jimmy FootmanAndrea Hernandez-Gonzalez, MD      . clonazePAM Scarlette Calico(KLONOPIN) tablet 0.5 mg  0.5 mg Oral BID Jimmy FootmanAndrea Hernandez-Gonzalez, MD  0.5 mg at 09/29/15 1610  . haloperidol (HALDOL) tablet 10 mg  10 mg Oral QHS Jimmy Footman, MD      . Melene Muller ON 09/30/2015] haloperidol (HALDOL) tablet 5 mg  5 mg Oral Daily Jimmy Footman, MD      . levothyroxine (SYNTHROID, LEVOTHROID) tablet 125 mcg  125 mcg Oral QAC breakfast Jimmy Footman, MD   125 mcg at 09/29/15 0603  . LORazepam (ATIVAN) tablet 2 mg  2 mg Oral Q6H PRN Jimmy Footman, MD   2 mg at 09/24/15 1527   Or  . LORazepam (ATIVAN) injection 2 mg  2 mg Intramuscular Q6H PRN Jimmy Footman,  MD      . magnesium hydroxide (MILK OF MAGNESIA) suspension 30 mL  30 mL Oral Daily PRN Jimmy Footman, MD      . metFORMIN (GLUCOPHAGE) tablet 1,000 mg  1,000 mg Oral BID WC Jimmy Footman, MD   1,000 mg at 09/29/15 0907  . OLANZapine (ZYPREXA) injection 10 mg  10 mg Intramuscular BID Jimmy Footman, MD   10 mg at 09/26/15 1039  . OLANZapine (ZYPREXA) tablet 20 mg  20 mg Oral BID Jimmy Footman, MD   20 mg at 09/29/15 0907  . simvastatin (ZOCOR) tablet 20 mg  20 mg Oral q1800 Jimmy Footman, MD   20 mg at 09/28/15 1718  . traZODone (DESYREL) tablet 100 mg  100 mg Oral QHS Jimmy Footman, MD   100 mg at 09/28/15 2143    Lab Results: No results found for this or any previous visit (from the past 48 hour(s)).  Blood Alcohol level:  Lab Results  Component Value Date   ETH <5 09/16/2015    Physical Findings: AIMS: Facial and Oral Movements Muscles of Facial Expression: None, normal, ,  ,  ,    CIWA:    COWS:     Musculoskeletal: Strength & Muscle Tone: within normal limits Gait & Station: normal Patient leans: N/A  Psychiatric Specialty Exam: Review of Systems  Unable to perform ROS   There were no vitals taken for this visit.There is no height or weight on file to calculate BMI.  General Appearance: Disheveled  Eye Contact::  Minimal  Speech:  Normal Rate  Volume:  Decreased  Mood:  Anxious  Affect:  Constricted  Thought Process:  Disorganized  Orientation:  NA  Thought Content:  Disorganized thought processes answers are hyper religious  Suicidal Thoughts:  n/a  Homicidal Thoughts:  n/a  Memory:  NA  Judgement:  Impaired  Insight:  Lacking  Psychomotor Activity:  Normal  Concentration:  NA  Recall:  NA  Fund of Knowledge:NA  Language: Fair  Akathisia:  No  Handed:    AIMS (if indicated):     Assets:  Financial Resources/Insurance Housing Social Support  ADL's:  Intact  Cognition: WNL  Sleep:   Number of Hours: 8.15   Treatment Plan Summary:  Patient was mute today.  Continue to give all oral medications crushed and mixed with apple sauce  Schizophrenia: Continue Zyprexa 20 mg by mouth twice a day along with Haldol. I will increase haldol to 5 mg po q am and 10 mg po qhs.     On non emergency forced medications since 3/21.  Will received zyprexa 10 mg IM if he refuses oral olanzapine.    Anxiety: Continue clonazepam 0.5 mg po bid  Agitation: Continue Ativan 2 mg by mouth or IM every 6 hours as needed  EPS: continue amantadine 100 mg po bid  Insomnia: Continue trazodone 100 mg by mouth daily at bedtime  Dyslipidemia: Continue Zocor 20 mg by mouth daily  Hypothyroidism: continue syntrhoid 125 mcg/day  Diabetes: Continue Glucophage at   by mouth twice a day  Precautions every 15 minute checks  Diet regular  Hospitalization and status continue involuntary commitment  Disposition: Per Child psychotherapist he is not able to return to prior placement which was an ALF. SW found new [facility]. Will  D/c there once stable  Discharge follow-up continue with [facility] ACT services.  Labs: Unable to obtain any labs as patient refuses. Not able to draw lipid panel, hemoglobin A1c, or prolactin levels..  Social issues: Responsible local management entity ---[facility] Outpatient provider--- [facility] team in [place] Patient's guardian is [facility]  [facility] from Nov [facility]: 26 [facility] due to refusing psychotropics. Per records at baseline he is hyper religious and response to internal stimuli. Patient was discharged on lisinopril 20 mg, aspirin 81 mg, clonazepam 0.5 mg twice a day, metformin at thousand milligrams twice a day, Synthroid 125 g daily, simvastatin 10 mg a day, Risperdal orally disintegrating tablet 4 mg twice a day, olanzapine orally  disintegrating tablet 20 mg twice a day  Jimmy Footman, MD 09/29/2015, 12:07 PM

## 2015-09-29 NOTE — [facility] Group Notes (Signed)
[facility] LCSW Group Therapy   09/29/2015 1pm  Type of Therapy: Group Therapy   Participation Level: Did Not Attend. Patient invited to participate but declined.    Gurbani Figge F. Jesscia Imm, MSW, LCSWA, LCAS   

## 2015-09-29 NOTE — Progress Notes (Signed)
Patient ID: [name], male   DOB: 03-Sep-1955, 60 y.o.   MRN: [id number] CS[facility] as well as the pt's authorization number [id number], and is awaiting [facility]

## 2015-09-30 MED ORDER — OLANZAPINE 10 MG PO TABS
20.0000 mg | ORAL_TABLET | Freq: Two times a day (BID) | ORAL | Status: DC
  Administered 2015-09-30 – 2015-10-03 (×6): 20 mg via ORAL
  Filled 2015-09-30 (×6): qty 2

## 2015-09-30 MED ORDER — OLANZAPINE 10 MG IM SOLR
10.0000 mg | Freq: Two times a day (BID) | INTRAMUSCULAR | Status: DC
  Filled 2015-09-30: qty 10

## 2015-09-30 NOTE — Plan of Care (Signed)
Problem: Alteration in thought process Goal: LTG-Patient has not harmed self or others in at least 2 days Outcome: Progressing Patient has not harmed self in at least 2 days     

## 2015-09-30 NOTE — Plan of Care (Signed)
Problem: Ineffective individual coping Goal: STG: Patient will remain free from self harm Outcome: Progressing Pt safe on the unit at this time     

## 2015-09-30 NOTE — Progress Notes (Signed)
Patient ID: Charlesetta IvoryFinley Bartell, male   DOB: [birth year], 60 y.o.   MRN: [id number]  CSW attempted to complete PSA. Pt unable to participate due to severity of symptoms.   Daisy FloroCandace L Leeandra Ellerson MSW, LCSWA  09/30/2015 10:39 AM

## 2015-09-30 NOTE — Plan of Care (Signed)
Problem: Ineffective individual coping Goal: LTG: Patient will report a decrease in negative feelings Outcome: Progressing Patient denies SI.

## 2015-09-30 NOTE — [facility] Group Notes (Signed)
[facility] Group Notes:  (Nursing/MHT/Case Management/Adjunct)  Date:  09/30/2015  Time:  3:38 AM  Type of Therapy:  Group Therapy  Participation Level:  Did Not Attend    Dheeraj Hail Joy Aine Strycharz 09/30/2015, 3:38 AM 

## 2015-09-30 NOTE — Progress Notes (Signed)
D: pt does not answer questions, pt appears to be responding, but does minimal communication. Pt appears disorganized at times. Pt will follow directions , pt religiously pre-occupied at times.   A: Pt was offered support and encouragement. Pt was given scheduled medications. Pt was encourage to attend groups. Q 15 minute checks were done for safety.   R: Pt is taking medication. Pt has no complaints.Pt receptive to treatment and safety maintained on unit.

## 2015-09-30 NOTE — Plan of Care (Signed)
Problem: Ineffective individual coping Goal: LTG: Patient will report a decrease in negative feelings Outcome: Not Progressing Patient stated that he did not know how he felt today or if he felt better today than he did yesterday

## 2015-09-30 NOTE — [facility] Group Notes (Signed)
[facility] LCSW Group Therapy  09/30/2015 3:49 PM  Type of Therapy:  Group Therapy  Participation Level:  Did Not Attend  Modes of Intervention:  Discussion, Education, Socialization and Support  Summary of Progress/Problems:Pt will identify unhealthy thoughts and how they impact their emotions and behavior. Pt will be encouraged to discuss these thoughts, emotions and behaviors with the group.   Liron Eissler L Michaline Kindig MSW, LCSWA  09/30/2015, 3:49 PM   

## 2015-09-30 NOTE — Progress Notes (Signed)
D:  Patient is alert, however does not appear to be completely oriented on the unit this shift.  Patient did attend outside group today.  Patient is unwilling or unable to answer questions coherently at the present time.   A:  Scheduled medications are administered to patient as per MD orders.  Emotional support and encouragement are provided.  Patient is maintained on q.15 minute safety checks.  Patient is informed to notify staff with questions or concerns. R:  No adverse medication reactions are noted.  Patient is cooperative with medication administration with some patience and encouragement today.  Patient is isolating to his room and the back dayroom today.  Patient continues to write on his papers.  Patient cannot coherently answer interview questions this shift.  Patient does not interact with others on the unit this shift.  Patient contracts for safety at this time.  Patient remains safe at this time.

## 2015-09-30 NOTE — Progress Notes (Signed)
D: Pt denies SI/HI/AVH. Pt is irritable, flat and unwilling to participate in treatment plan. Pt isolates in his  room and he is not interacting with peers and staff appropriately.  A: Pt was offered support and encouragement. Pt was given scheduled medications. Pt was encouraged to attend groups. Q 15 minute checks were done for safety.  R: Pt did not attend groups. Pt is taking medication. Pt is not receptive to treatment and safety maintained on unit.

## 2015-09-30 NOTE — Progress Notes (Signed)
Patient ID: [name], male   DOB: May 07, 1956, 60 y.o.   MRN: [id number] [facility] MD Progress Note  09/30/2015 12:37 PM [name]  MRN:  [id number] Subjective:  Patient is seen in his room lying in his bed. Initially patient was sleeping. He was easily aroused by calling his name and then started to put a hand on his chest and mumbling. He appears to be completely disorganized and has been isolative to his room. He is disheveled and wearing stained clothing.Compliant with his medications today with much encouragement. He has not been adisturbance on the unit, sits by himself in the dayroom. Unable to answer any questions.  Principal Problem: Undifferentiated schizophrenia (HCC) Diagnosis:   Patient Active Problem List   Diagnosis Date Noted  . Dyslipidemia [E78.5] 09/25/2015  . Undifferentiated schizophrenia (HCC) [F20.3]   . Diabetes (HCC) [E11.9] 09/16/2015  . Hypothyroid [E03.9] 09/16/2015   Total Time spent with patient: 20 min    Past Medical History:  Past Medical History  Diagnosis Date  . Schizophrenia (HCC)   . Diabetes mellitus without complication (HCC)   . Thyroid disease   . Hyperlipemia   . Sickle cell trait (HCC)   . HTN (hypertension)   . TIA (transient ischemic attack) in 2009  . Seizure (HCC) While toxic on Depakote  . Lithium toxicity   . Neutropenia (HCC)   . Hyperammonemia (HCC) While on Depakote   History reviewed. No pertinent past surgical history.  Social History:  History  Alcohol Use: Not on file     History  Drug Use Not on file    Social History   Social History  . Marital Status: Single    Spouse Name: N/A  . Number of Children: N/A  . Years of Education: N/A   Social History Main Topics  . Smoking status: Unknown If Ever Smoked  . Smokeless tobacco: None  . Alcohol Use: None  . Drug Use: None  . Sexual Activity: Not Asked   Other Topics Concern  . None   Social History Narrative      Current Medications: Current  Facility-Administered Medications  Medication Dose Route Frequency Provider Last Rate Last Dose  . acetaminophen (TYLENOL) tablet 650 mg  650 mg Oral Q6H PRN Jimmy Footman, MD      . alum & mag hydroxide-simeth (MAALOX/MYLANTA) 200-200-20 MG/5ML suspension 30 mL  30 mL Oral Q4H PRN Jimmy Footman, MD      . amantadine (SYMMETREL) capsule 100 mg  100 mg Oral BID Jimmy Footman, MD   100 mg at 09/30/15 7829  . aspirin chewable tablet 81 mg  81 mg Oral Daily Jimmy Footman, MD   81 mg at 09/30/15 5621  . clonazePAM (KLONOPIN) tablet 0.5 mg  0.5 mg Oral BID Jimmy Footman, MD   0.5 mg at 09/30/15 3086  . haloperidol (HALDOL) tablet 10 mg  10 mg Oral QHS Jimmy Footman, MD   10 mg at 09/29/15 2212  . haloperidol (HALDOL) tablet 5 mg  5 mg Oral Daily Jimmy Footman, MD   5 mg at 09/30/15 5784  . levothyroxine (SYNTHROID, LEVOTHROID) tablet 125 mcg  125 mcg Oral QAC breakfast Jimmy Footman, MD   125 mcg at 09/30/15 0755  . LORazepam (ATIVAN) tablet 2 mg  2 mg Oral Q6H PRN Jimmy Footman, MD   2 mg at 09/24/15 1527   Or  . LORazepam (ATIVAN) injection 2 mg  2 mg Intramuscular Q6H PRN Jimmy Footman, MD      .  magnesium hydroxide (MILK OF MAGNESIA) suspension 30 mL  30 mL Oral Daily PRN Jimmy FootmanAndrea Hernandez-Gonzalez, MD      . metFORMIN (GLUCOPHAGE) tablet 1,000 mg  1,000 mg Oral BID WC Jimmy FootmanAndrea Hernandez-Gonzalez, MD   1,000 mg at 09/30/15 0755  . OLANZapine (ZYPREXA) injection 10 mg  10 mg Intramuscular BID Jimmy FootmanAndrea Hernandez-Gonzalez, MD   10 mg at 09/26/15 1039  . OLANZapine (ZYPREXA) tablet 20 mg  20 mg Oral BID Jimmy FootmanAndrea Hernandez-Gonzalez, MD   20 mg at 09/30/15 1042   Or  . OLANZapine (ZYPREXA) injection 10 mg  10 mg Intramuscular BID Jimmy FootmanAndrea Hernandez-Gonzalez, MD      . OLANZapine [facility](ZYPREXA) tablet 20 mg  20 mg Oral BID Jimmy FootmanAndrea Hernandez-Gonzalez, MD   20 mg at 09/30/15 [id number]  . simvastatin (ZOCOR)  tablet 20 mg  20 mg Oral q1800 Jimmy FootmanAndrea Hernandez-Gonzalez, MD   20 mg at 09/29/15 1714  . traZODone (DESYREL) tablet 100 mg  100 mg Oral QHS Jimmy FootmanAndrea Hernandez-Gonzalez, MD   100 mg at 09/29/15 2212    Lab Results: No results found for this or any previous visit (from the past 48 hour(s)).  Blood Alcohol level:  Lab Results  Component Value Date   ETH <5 09/16/2015    Physical Findings: AIMS: Facial and Oral Movements Muscles of Facial Expression: None, normal, ,  ,  ,    CIWA:    COWS:     Musculoskeletal: Strength & Muscle Tone: within normal limits Gait & Station: normal Patient leans: N/A  Psychiatric Specialty Exam: Review of Systems  Unable to perform ROS Constitutional: Negative.   HENT: Negative.   Eyes: Negative.   Respiratory: Negative.   Cardiovascular: Negative.   Gastrointestinal: Negative.   Genitourinary: Negative.   Musculoskeletal: Negative.   Skin: Negative.   Neurological: Negative.   Endo/Heme/Allergies: Negative.   Psychiatric/Behavioral: Positive for hallucinations.    There were no vitals taken for this visit.There is no height or weight on file to calculate BMI.  General Appearance: Disheveled  Eye Contact::  Minimal  Speech:  Normal Rate  Volume:  Decreased  Mood:  Anxious  Affect:  Constricted  Thought Process:  Disorganized  Orientation:  NA  Thought Content:  Disorganized thought processes answers are hyper religious  Suicidal Thoughts:  n/a  Homicidal Thoughts:  n/a  Memory:  NA  Judgement:  Impaired  Insight:  Lacking  Psychomotor Activity:  Normal  Concentration:  NA  Recall:  NA  Fund of Knowledge:NA  Language: Fair  Akathisia:  No  Handed:    AIMS (if indicated):     Assets:  Financial Resources/Insurance Housing Social Support  ADL's:  Intact  Cognition: WNL  Sleep:  Number of Hours: 7.75   Treatment Plan Summary:  Patient was mute today.  Continue to give all oral medications crushed and mixed with apple  sauce  Schizophrenia: Continue Zyprexa 20 mg by mouth twice a day along with Haldol at 5 mg po q am and 10 mg po qhs.     On non emergency forced medications since 3/21.  Will received zyprexa 10 mg IM if he refuses oral olanzapine.    Anxiety: Continue clonazepam 0.5 mg po bid  Agitation: Continue Ativan 2 mg by mouth or IM every 6 hours as needed  EPS: continue amantadine 100 mg po bid   Insomnia: Continue trazodone 100 mg by mouth daily at bedtime  Dyslipidemia: Continue Zocor 20 mg by mouth daily  Hypothyroidism: continue syntrhoid 125 mcg/day  Diabetes:  Continue Glucophage at   by mouth twice a day  Precautions every 15 minute checks  Diet regular  Hospitalization and status continue involuntary commitment  Disposition: Per Child psychotherapist he is not able to return to prior placement which was an ALF. SW found new [facility]. Will  D/c there once stable  Discharge follow-up continue with [facility] services.  Labs: Unable to obtain any labs as patient refuses. Not able to draw lipid panel, hemoglobin A1c, or prolactin levels..  Social issues: Responsible local management entity [facility] Outpatient provider--- psychotherapeutic services ACT team in [place] Patient's guardian is [facility]  Records from [facility] from Nov 2015: 26 [facility] due to refusing psychotropics. Per records at baseline he is hyper religious and response to internal stimuli. Patient was discharged on lisinopril 20 mg, aspirin 81 mg, clonazepam 0.5 mg twice a day, metformin at thousand milligrams twice a day, Synthroid 125 g daily, simvastatin 10 mg a day, Risperdal orally disintegrating tablet 4 mg twice a day, olanzapine orally disintegrating tablet 20 mg twice a day  Miquel Stacks, MD 09/30/2015, 12:37 PM

## 2015-10-01 NOTE — [facility] Group Notes (Signed)
[facility] LCSW Group Therapy  10/01/2015 2:08 PM  Type of Therapy:  Group Therapy  Participation Level:  Did Not Attend  Modes of Intervention:  Activity, Discussion, Education, Socialization and Support  Summary of Progress/Problems: Balance in life: Patients will discuss the concept of balance and how it looks and feels to be unbalanced. Pt will identify areas in their life that is unbalanced and ways to become more balanced.    Meda Dudzinski L Gertrue Willette MSW, LCSWA  10/01/2015, 2:08 PM  

## 2015-10-01 NOTE — [facility] Group Notes (Signed)
[facility] Group Notes:  (Nursing/MHT/Case Management/Adjunct)  Date:  10/01/2015  Time:  1:45 AM  Type of Therapy:  Group Therapy  Participation Level:  Did Not Attend    Summary of Progress/Problems:  [name] 10/01/2015, 1:45 AM

## 2015-10-01 NOTE — Progress Notes (Signed)
Patient ID: [name], male   DOB: 06/23/1956, 60 y.o.   MRN: [id number]  [facility] MD Progress Note  10/01/2015 11:21 AM Kahron Kauth  MRN:  [id number] Subjective:  Patient is seen in his room lying in his bed. Initially patient was sleeping. He was more ambulatory on the unit today. Asked the nursing staff for a bible. Compliant with his medications today with much encouragement. He has not been adisturbance on the unit, sits by himself in the dayroom. Unable to answer any questions, speech is disorganized. Not a disturbance on the unit.  Principal Problem: Undifferentiated schizophrenia (HCC) Diagnosis:   Patient Active Problem List   Diagnosis Date Noted  . Dyslipidemia [E78.5] 09/25/2015  . Undifferentiated schizophrenia (HCC) [F20.3]   . Diabetes (HCC) [E11.9] 09/16/2015  . Hypothyroid [E03.9] 09/16/2015   Total Time spent with patient: 20 min    Past Medical History:  Past Medical History  Diagnosis Date  . Schizophrenia (HCC)   . Diabetes mellitus without complication (HCC)   . Thyroid disease   . Hyperlipemia   . Sickle cell trait (HCC)   . HTN (hypertension)   . TIA (transient ischemic attack) in 2009  . Seizure (HCC) While toxic on Depakote  . Lithium toxicity   . Neutropenia (HCC)   . Hyperammonemia (HCC) While on Depakote   History reviewed. No pertinent past surgical history.  Social History:  History  Alcohol Use: Not on file     History  Drug Use Not on file    Social History   Social History  . Marital Status: Single    Spouse Name: N/A  . Number of Children: N/A  . Years of Education: N/A   Social History Main Topics  . Smoking status: Unknown If Ever Smoked  . Smokeless tobacco: None  . Alcohol Use: None  . Drug Use: None  . Sexual Activity: Not Asked   Other Topics Concern  . None   Social History Narrative      Current Medications: Current Facility-Administered Medications  Medication Dose Route Frequency Provider Last Rate Last  Dose  . acetaminophen (TYLENOL) tablet 650 mg  650 mg Oral Q6H PRN Jimmy Footman, MD      . alum & mag hydroxide-simeth (MAALOX/MYLANTA) 200-200-20 MG/5ML suspension 30 mL  30 mL Oral Q4H PRN Jimmy Footman, MD      . amantadine (SYMMETREL) capsule 100 mg  100 mg Oral BID Jimmy Footman, MD   100 mg at 10/01/15 1053  . aspirin chewable tablet 81 mg  81 mg Oral Daily Jimmy Footman, MD   81 mg at 10/01/15 1053  . clonazePAM (KLONOPIN) tablet 0.5 mg  0.5 mg Oral BID Jimmy Footman, MD   0.5 mg at 10/01/15 1053  . haloperidol (HALDOL) tablet 10 mg  10 mg Oral QHS Jimmy Footman, MD   10 mg at 09/30/15 2156  . haloperidol (HALDOL) tablet 5 mg  5 mg Oral Daily Jimmy Footman, MD   5 mg at 10/01/15 1054  . levothyroxine (SYNTHROID, LEVOTHROID) tablet 125 mcg  125 mcg Oral QAC breakfast Jimmy Footman, MD   125 mcg at 10/01/15 [phone]  . LORazepam (ATIVAN) tablet 2 mg  2 mg Oral Q6H PRN Jimmy Footman, MD   2 mg at 09/24/15 1527   Or  . LORazepam (ATIVAN) injection 2 mg  2 mg Intramuscular Q6H PRN Jimmy Footman, MD      . magnesium hydroxide (MILK OF MAGNESIA) suspension 30 mL  30 mL Oral Daily PRN  Jimmy FootmanAndrea Hernandez-Gonzalez, MD      . metFORMIN (GLUCOPHAGE) tablet 1,000 mg  1,000 mg Oral BID WC Jimmy FootmanAndrea Hernandez-Gonzalez, MD   1,000 mg at 10/01/15 0818  . OLANZapine (ZYPREXA) tablet 20 mg  20 mg Oral BID Jimmy FootmanAndrea Hernandez-Gonzalez, MD   20 mg at 10/01/15 1053   Or  . OLANZapine (ZYPREXA) injection 10 mg  10 mg Intramuscular BID Jimmy FootmanAndrea Hernandez-Gonzalez, MD      . simvastatin (ZOCOR) tablet 20 mg  20 mg Oral q1800 Jimmy FootmanAndrea Hernandez-Gonzalez, MD   20 mg at 09/30/15 1659  . traZODone (DESYREL) tablet 100 mg  100 mg Oral QHS Jimmy FootmanAndrea Hernandez-Gonzalez, MD   100 mg at 09/30/15 2156    Lab Results: No results found for this or any previous visit (from the past 48 hour(s)).  Blood Alcohol level:  Lab  Results  Component Value Date   ETH <5 09/16/2015    Physical Findings: AIMS: Facial and Oral Movements Muscles of Facial Expression: None, normal, ,  ,  ,    CIWA:    COWS:     Musculoskeletal: Strength & Muscle Tone: within normal limits Gait & Station: normal Patient leans: N/A  Psychiatric Specialty Exam: Review of Systems  Unable to perform ROS Constitutional: Negative.   HENT: Negative.   Eyes: Negative.   Respiratory: Negative.   Cardiovascular: Negative.   Gastrointestinal: Negative.   Genitourinary: Negative.   Musculoskeletal: Negative.   Skin: Negative.   Neurological: Negative.   Endo/Heme/Allergies: Negative.   Psychiatric/Behavioral: Positive for hallucinations.    There were no vitals taken for this visit.There is no height or weight on file to calculate BMI.  General Appearance: Disheveled  Eye Contact::  Minimal  Speech:  Normal Rate  Volume:  Decreased  Mood:  Anxious  Affect:  Constricted  Thought Process:  Disorganized  Orientation:  NA  Thought Content:  Disorganized thought processes answers are hyper religious  Suicidal Thoughts:  n/a  Homicidal Thoughts:  n/a  Memory:  NA  Judgement:  Impaired  Insight:  Lacking  Psychomotor Activity:  Normal  Concentration:  NA  Recall:  NA  Fund of Knowledge:NA  Language: Fair  Akathisia:  No  Handed:    AIMS (if indicated):     Assets:  Financial Resources/Insurance Housing Social Support  ADL's:  Intact  Cognition: WNL  Sleep:  Number of Hours: 6.3   Treatment Plan Summary:  Patient was mute today.  Continue to give all oral medications crushed and mixed with apple sauce  Schizophrenia: Continue Zyprexa 20 mg by mouth twice a day along with Haldol at 5 mg po q am and 10 mg po qhs.     On non emergency forced medications since 3/21.  Will received zyprexa 10 mg IM if he refuses oral olanzapine.    Anxiety: Continue clonazepam 0.5 mg po bid  Agitation: Continue Ativan 2 mg by mouth or  IM every 6 hours as needed  EPS: continue amantadine 100 mg po bid   Insomnia: Continue trazodone 100 mg by mouth daily at bedtime  Dyslipidemia: Continue Zocor 20 mg by mouth daily  Hypothyroidism: continue syntrhoid 125 mcg/day  Diabetes: Continue Glucophage at 1000mg   by mouth twice a day  Precautions every 15 minute checks  Diet regular  Hospitalization and status continue involuntary commitment  Disposition: Per Child psychotherapistsocial worker he is not able to return to prior placement which was an ALF. SW found new [facility]. Will  D/c there once stable  Discharge follow-up continue  with [facility] services.  Labs: Unable to obtain any labs as patient refuses. Not able to draw lipid panel, hemoglobin A1c, or prolactin levels..  Social issues: Responsible local management entity ---[facility] Outpatient provider--- psychotherapeutic services ACT team in [place] [address] [place] Patient's guardian is [facility]  Records [facility] from Nov 2015: 26 [facility] due to refusing psychotropics. Per records at baseline he is hyper religious and response to internal stimuli. Patient was discharged on lisinopril 20 mg, aspirin 81 mg, clonazepam 0.5 mg twice a day, metformin at thousand milligrams twice a day, Synthroid 125 g daily, simvastatin 10 mg a day, Risperdal orally disintegrating tablet 4 mg twice a day, olanzapine orally disintegrating tablet 20 mg twice a day  Caydan Mctavish, MD 10/01/2015, 11:21 AM

## 2015-10-01 NOTE — Plan of Care (Signed)
Problem: Alteration in thought process Goal: LTG-Patient has not harmed self or others in at least 2 days Outcome: Progressing Pt safe on the unit at this time      

## 2015-10-01 NOTE — Progress Notes (Signed)
Patient is alert and oriented no signs of pain or distress noted.  Patient is visible throughout the milleu and is medication compliant.  Patient denies any SI/HI/AVh at this time.  Patient continues to have flight of ideas and periods of slow response. Patient has to be constantly encouraged to complete task given to him.  Patient continues to be monitor for safety.

## 2015-10-01 NOTE — Progress Notes (Signed)
D: Pt denies SI/HI/AVH. Pt continues to be disorganized, flight of ideas and forwards little. Pt will follow commands with much coaxing.   A: Pt was offered support and encouragement. Pt was given scheduled medications. Pt was encourage to attend groups. Q 15 minute checks were done for safety.   R: Pt is taking medication. Pt has no complaints.Pt receptive to treatment and safety maintained on unit.

## 2015-10-02 NOTE — [facility] Group Notes (Signed)
[facility] LCSW Aftercare Discharge Planning Group Note  10/02/2015 9:30 AM  Participation Quality: Did Not Attend. Patient invited to participate but declined.   Wendy Hoback F. Dion Sibal, MSW, LCSWA, LCAS   

## 2015-10-02 NOTE — Progress Notes (Signed)
Patient ID: [name], male   DOB: 26-Sep-1955, 60 y.o.   MRN: [id number]  [facility] MD Progress Note  10/02/2015 7:24 PM [name]  MRN:  [id number] Subjective:  Patient is seen in his room lying in his bed. Mostly mute.  Poor hygiene and strong body odor. Withdrawn to room, at times gets up and walks around the unit.  Compliant with meds. Oral intake is around 100%.   Per nursing: Patient isolates in his room most of the time.His thoughts continues to be disorganized & have flight of ideas.Took medicines without much hesitation states "God heals."Did not change cloths or attend any groups.Visible in the milieu for meals,no interactions with peers.Encouraged patient to attend groups and change cloths.Appetite & energy level good.  Principal Problem: Undifferentiated schizophrenia (HCC) Diagnosis:   Patient Active Problem List   Diagnosis Date Noted  . Dyslipidemia [E78.5] 09/25/2015  . Undifferentiated schizophrenia (HCC) [F20.3]   . Diabetes (HCC) [E11.9] 09/16/2015  . Hypothyroid [E03.9] 09/16/2015   Total Time spent with patient: 30 min    Past Medical History:  Past Medical History  Diagnosis Date  . Schizophrenia (HCC)   . Diabetes mellitus without complication (HCC)   . Thyroid disease   . Hyperlipemia   . Sickle cell trait (HCC)   . HTN (hypertension)   . TIA (transient ischemic attack) in 2009  . Seizure (HCC) While toxic on Depakote  . Lithium toxicity   . Neutropenia (HCC)   . Hyperammonemia (HCC) While on Depakote   History reviewed. No pertinent past surgical history.  Social History:  History  Alcohol Use: Not on file     History  Drug Use Not on file    Social History   Social History  . Marital Status: Single    Spouse Name: N/A  . Number of Children: N/A  . Years of Education: N/A   Social History Main Topics  . Smoking status: Unknown If Ever Smoked  . Smokeless tobacco: None  . Alcohol Use: None  . Drug Use: None  . Sexual Activity: Not Asked    Other Topics Concern  . None   Social History Narrative      Current Medications: Current Facility-Administered Medications  Medication Dose Route Frequency Provider Last Rate Last Dose  . acetaminophen (TYLENOL) tablet 650 mg  650 mg Oral Q6H PRN Jimmy Footman, MD      . alum & mag hydroxide-simeth (MAALOX/MYLANTA) 200-200-20 MG/5ML suspension 30 mL  30 mL Oral Q4H PRN Jimmy Footman, MD      . amantadine (SYMMETREL) capsule 100 mg  100 mg Oral BID Jimmy Footman, MD   100 mg at 10/02/15 6213  . aspirin chewable tablet 81 mg  81 mg Oral Daily Jimmy Footman, MD   81 mg at 10/02/15 0865  . clonazePAM (KLONOPIN) tablet 0.5 mg  0.5 mg Oral BID Jimmy Footman, MD   0.5 mg at 10/02/15 7846  . haloperidol (HALDOL) tablet 10 mg  10 mg Oral QHS Jimmy Footman, MD   10 mg at 10/01/15 2159  . haloperidol (HALDOL) tablet 5 mg  5 mg Oral Daily Jimmy Footman, MD   5 mg at 10/02/15 9629  . levothyroxine (SYNTHROID, LEVOTHROID) tablet 125 mcg  125 mcg Oral QAC breakfast Jimmy Footman, MD   125 mcg at 10/02/15 [phone]  . LORazepam (ATIVAN) tablet 2 mg  2 mg Oral Q6H PRN Jimmy Footman, MD   2 mg at 09/24/15 1527   Or  . LORazepam (ATIVAN) injection  2 mg  2 mg Intramuscular Q6H PRN Jimmy Footman, MD      . magnesium hydroxide (MILK OF MAGNESIA) suspension 30 mL  30 mL Oral Daily PRN Jimmy Footman, MD      . metFORMIN (GLUCOPHAGE) tablet 1,000 mg  1,000 mg Oral BID WC Jimmy Footman, MD   1,000 mg at 10/02/15 1739  . OLANZapine (ZYPREXA) tablet 20 mg  20 mg Oral BID Jimmy Footman, MD   20 mg at 10/02/15 1610   Or  . OLANZapine (ZYPREXA) injection 10 mg  10 mg Intramuscular BID Jimmy Footman, MD      . simvastatin (ZOCOR) tablet 20 mg  20 mg Oral q1800 Jimmy Footman, MD   20 mg at 10/02/15 1740  . traZODone (DESYREL) tablet 100 mg  100  mg Oral QHS Jimmy Footman, MD   100 mg at 10/01/15 2159    Lab Results: No results found for this or any previous visit (from the past 48 hour(s)).  Blood Alcohol level:  Lab Results  Component Value Date   ETH <5 09/16/2015    Physical Findings: AIMS: Facial and Oral Movements Muscles of Facial Expression: None, normal, ,  ,  ,    CIWA:    COWS:     Musculoskeletal: Strength & Muscle Tone: within normal limits Gait & Station: normal Patient leans: N/A  Psychiatric Specialty Exam: Review of Systems  Unable to perform ROS Neurological: Negative.     There were no vitals taken for this visit.There is no height or weight on file to calculate BMI.  General Appearance: Disheveled  Eye Contact::  Minimal  Speech:  Normal Rate  Volume:  Decreased  Mood:  Anxious  Affect:  Constricted  Thought Process:  Disorganized  Orientation:  NA  Thought Content:  Disorganized thought processes answers are hyper religious  Suicidal Thoughts:  n/a  Homicidal Thoughts:  n/a  Memory:  NA  Judgement:  Impaired  Insight:  Lacking  Psychomotor Activity:  Normal  Concentration:  NA  Recall:  NA  Fund of Knowledge:NA  Language: Fair  Akathisia:  No  Handed:    AIMS (if indicated):     Assets:  Financial Resources/Insurance Housing Social Support  ADL's:  Intact  Cognition: WNL  Sleep:  Number of Hours: 6.3   Treatment Plan Summary:  Patient was mute today.  Continue to give all oral medications crushed and mixed with apple sauce  Schizophrenia: Continue Zyprexa 20 mg by mouth twice a day along with Haldol at 5 mg po q am and 10 mg po qhs.   Improving slowly.  Not combative with meds, good oral intake.  On non emergency forced medications since 3/21.  Will received zyprexa 10 mg IM if he refuses oral olanzapine.    Anxiety: Continue clonazepam 0.5 mg po bid  Agitation: Continue Ativan 2 mg by mouth or IM every 6 hours as needed  EPS: continue amantadine 100 mg po  bid   Insomnia: Continue trazodone 100 mg by mouth daily at bedtime  Dyslipidemia: Continue Zocor 20 mg by mouth daily  Hypothyroidism: continue syntrhoid 125 mcg/day  Diabetes: Continue Glucophage at   by mouth twice a day  HTN: h/o HTN but unable to check VS.  Will attempt to check VS tomorrow.  Precautions every 15 minute checks  Diet regular  Hospitalization and status continue involuntary commitment  Disposition: Per Child psychotherapist he is not able to return to prior placement which was an ALF. SW states that  [facility] that had previously accepted [facility] no longer has beds available.    Discharge follow-up continue with [facility] services.  Labs: Unable to obtain any labs as patient refuses. Not able to draw lipid panel, hemoglobin A1c, or prolactin levels.  Social issues: Responsible local management entity [facility]--- psych[place] Patient's guardian is [facility]Kay Flack  [facility] due to refusing psychotropics. Per records at baseline he is hyper religious and response to internal stimuli. Patient was discharged on lisinopril 20 mg, aspirin 81 mg, clonazepam 0.5 mg twice a day, metformin at thousand milligrams twice a day, Synthroid 125 g daily, simvastatin 10 mg a day, Risperdal orally disintegrating tablet 4 mg twice a day, olanzapine orally disintegrating tablet 20 mg twice a day  Jimmy FootmanHernandez-Gonzalez,  Remi Rester, MD 10/02/2015, 7:24 PM

## 2015-10-02 NOTE — [facility] Group Notes (Signed)
[facility] Group Notes:  (Nursing/MHT/Case Management/Adjunct)  Date:  10/02/2015  Time:  3:15 PM  Type of Therapy:  Psychoeducational Skills  Participation Level:  Did Not Attend  Lynelle SmokeCara Travis [facility] 10/02/2015, 3:15 PM

## 2015-10-02 NOTE — Progress Notes (Signed)
Recreation Therapy Notes  Date: 03.27.17 Time: 3:00 pm Location: Craft Room  Group Topic: Self-expression  Goal Area(s) Addresses:  Patient will write at least one emotion they are experiencing. Patient will verbalize benefit of using art as a means of self-expression.  Behavioral Response: Did not attend  Intervention: Bottled Up  Activity: Patients were instructed to draw a bottle the way they viewed themselves. LRT provided examples. Patients were instructed to write the emotions they were feeling inside the bottle.  Education: LRT educated patients on different forms of self-expression.  Education Outcome: Patient did not attend group.  Clinical Observations/Feedback: Patient did not attend group.   Jacquelynn CreeGreene,Jannetta Massey M, LRT/CTRS 10/02/2015 3:52 PM

## 2015-10-02 NOTE — Plan of Care (Signed)
Problem: Alteration in thought process Goal: STG-Patient is able to sleep at least 6 hours per night Outcome: Progressing Patient sleeps through night more than 6 hours

## 2015-10-02 NOTE — Progress Notes (Signed)
Patient ID: [name], male   DOB: January 19, 1956, 60 y.o.   MRN: [id number] [facility]

## 2015-10-02 NOTE — Progress Notes (Signed)
D: Pt denies SI/HI/AV. Pt is pleasant and cooperative. Pt goal for today is to communicate with staff verbally without confusion                                                                                                                                                                              A: Pt was offered support and encouragement. Pt was given scheduled medications. Pt was encourage to attend groups. Q 15 minute checks were done for safety.  R:Pt attends groups and does not  Interact with peers and staff. Pt is taking medication. Pt has no complaints.Pt receptive to treatment and safety maintained on unit.

## 2015-10-02 NOTE — Progress Notes (Signed)
Patient ID: [name], male   DOB: 1955-09-19, 60 y.o.   MRN: [id number] CSW attempted a PSA with the pt, but the pt did not reply to prompts or questions from the CSW and would leave the room when in the presence of the CSW.

## 2015-10-02 NOTE — Progress Notes (Signed)
Patient ID: [name], male   DOB: 11/13/1955, 59 y.o.   MRN: [id number] CSW made an attempt to complete a PSA but the pt was unresponsive to questions and prompts by the CSW. 

## 2015-10-02 NOTE — Plan of Care (Signed)
Problem: Alteration in thought process Goal: STG-Patient is able to identify plan for continuing care at ( Patient is able to identify continuing plan for care at discharge)  Outcome: Not Progressing Patient does not verbalize any treatment plan.

## 2015-10-02 NOTE — Plan of Care (Signed)
Problem: Alteration in thought process Goal: LTG-Patient is able to perceive the environment accurately Outcome: Not Progressing Patient not oriented to person,place or time. Words not appropriate garbled speech

## 2015-10-02 NOTE — [facility] Group Notes (Signed)
[facility] LCSW Group Therapy   10/02/2015 1pm  Type of Therapy: Group Therapy   Participation Level: Did Not Attend. Patient invited to participate but declined.    Rozann Holts F. Felicitas Sine, MSW, LCSWA, LCAS   

## 2015-10-02 NOTE — Progress Notes (Signed)
Patient isolates in his room most of the time.His thoughts continues to be disorganized & have flight of ideas.Took medicines without much hesitation states "God heals."Did not change cloths or attend any groups.Visible in the milieu for meals,no interactions with peers.Encouraged patient to attend groups and change cloths.Appetite & energy level good.

## 2015-10-02 NOTE — [facility] Group Notes (Signed)
[facility] Group Notes:  (Nursing/MHT/Case Management/Adjunct)  Date:  10/02/2015  Time:  4:30 AM  Type of Therapy:  Group Therapy  Participation Level:  Did Not Attend   Summary of Progress/Problems:  [name] 10/02/2015, 4:30 AM

## 2015-10-03 MED ORDER — HALOPERIDOL 5 MG PO TABS
10.0000 mg | ORAL_TABLET | Freq: Every day | ORAL | Status: DC
  Administered 2015-10-03 – 2015-10-04 (×2): 10 mg via ORAL
  Filled 2015-10-03 (×2): qty 2

## 2015-10-03 MED ORDER — AMANTADINE HCL 100 MG PO CAPS
100.0000 mg | ORAL_CAPSULE | Freq: Two times a day (BID) | ORAL | Status: DC
  Administered 2015-10-03 – 2015-10-10 (×14): 100 mg via ORAL
  Filled 2015-10-03 (×14): qty 1

## 2015-10-03 MED ORDER — OLANZAPINE 10 MG IM SOLR: 10.0000 mg | Freq: Two times a day (BID) | INTRAMUSCULAR | Status: DC

## 2015-10-03 MED ORDER — METFORMIN HCL 500 MG PO TABS
1000.0000 mg | ORAL_TABLET | Freq: Two times a day (BID) | ORAL | Status: DC
  Administered 2015-10-03 – 2015-10-10 (×14): 1000 mg via ORAL
  Filled 2015-10-03 (×14): qty 2

## 2015-10-03 MED ORDER — ASPIRIN 81 MG PO CHEW
81.0000 mg | CHEWABLE_TABLET | Freq: Every day | ORAL | Status: DC
  Administered 2015-10-04 – 2015-10-10 (×7): 81 mg via ORAL
  Filled 2015-10-03 (×7): qty 1

## 2015-10-03 MED ORDER — OLANZAPINE 10 MG PO TABS
20.0000 mg | ORAL_TABLET | Freq: Two times a day (BID) | ORAL | Status: DC
  Administered 2015-10-03 – 2015-10-10 (×14): 20 mg via ORAL
  Filled 2015-10-03 (×14): qty 2

## 2015-10-03 MED ORDER — HALOPERIDOL 5 MG PO TABS
5.0000 mg | ORAL_TABLET | Freq: Every day | ORAL | Status: DC
  Administered 2015-10-04 – 2015-10-05 (×2): 5 mg via ORAL
  Filled 2015-10-03 (×2): qty 1

## 2015-10-03 MED ORDER — CLONAZEPAM 0.5 MG PO TABS
0.5000 mg | ORAL_TABLET | Freq: Two times a day (BID) | ORAL | Status: DC
  Administered 2015-10-03 – 2015-10-10 (×14): 0.5 mg via ORAL
  Filled 2015-10-03 (×14): qty 1

## 2015-10-03 MED ORDER — SIMVASTATIN 20 MG PO TABS
20.0000 mg | ORAL_TABLET | Freq: Every day | ORAL | Status: DC
  Administered 2015-10-04 – 2015-10-09 (×6): 20 mg via ORAL
  Filled 2015-10-03 (×9): qty 1

## 2015-10-03 NOTE — Progress Notes (Addendum)
Patient with flat affect, irritable and at times unwilling to participate. Eats meals in dayroom. Minimal interaction with peers. Irritable when writer initiates interaction. Refuses am meds at first attempt. Informed Im meds would be administered. Patient states "come to my room and I will take my meds". Nurse and witness to patient room, with small clear cup of meds. Patient requests meds into his hand, patient puts meds in his mouth. Mouth and hand check performed. Patient reluctant to have nurse monitor him. Patient eats snack, withdrawn and isolative in room. MD into visit. No SI/HI/AVH at this time. Social worker reports patient may meet with group homes today rt discharge. Safety maintained. Attends no therapy groups and unwilling to complete goal sheet.

## 2015-10-03 NOTE — Progress Notes (Signed)
Patient ID: [name], male   DOB: 1955-11-25, 60 y.o.   MRN: [id number] CSW made an attempt to complete a PSA but the pt was unresponsive to questions and prompts by the CSW.

## 2015-10-03 NOTE — Progress Notes (Addendum)
Patient ID: [name], male   DOB: [birth year], 60 y.o.   MRN: [id number]  [facility]  10/03/2015 9:40 AM [name]  MRN:  [id number] Subjective:  Patient is seen in his room lying in his bed. Mostly mute.  Poor hygiene and strong body odor. Withdrawn to room, at times gets up and walks around the unit.  Compliant with meds. Oral intake is around 100%. Per nursing staff patient continues to be difficult  with medication administration. Initially this morning he refused medication. Patient was told by nursing and that they were going to document a refusal. After the nurse left the room patient came after her and took his medications. Nurses states that initially he tried to cheek them and hit them under his blankets.  Per discussion in treatment team today will change all his medications to be given with breakfast and with dinner. The patient will be giving the medications with applesauce or with ice cream. Patient tends to like when the medications are mixed.  Compliance with unit regulations: Refuses vital signs and refuses labs.  Therapy: Withdrawn to his room. Patient is not participating in any type of therapy  Medication compliance: Patient maintains compliance  Oral intake: 100% yesterday.  Per nursing: Not Progressing.  Patient not oriented to person,place or time. Words not appropriate garbled speech  Principal Problem: Undifferentiated schizophrenia (HCC) Diagnosis:   Patient Active Problem List   Diagnosis Date Noted  . Dyslipidemia [E78.5] 09/25/2015  . Undifferentiated schizophrenia (HCC) [F20.3]   . Diabetes (HCC) [E11.9] 09/16/2015  . Hypothyroid [E03.9] 09/16/2015   Total Time spent with patient: 30 min    Past Medical History:  Past Medical History  Diagnosis Date  . Schizophrenia (HCC)   . Diabetes mellitus without complication (HCC)   . Thyroid disease   . Hyperlipemia   . Sickle cell trait (HCC)   . HTN (hypertension)   . TIA (transient  ischemic attack) in 2009  . Seizure (HCC) While toxic on Depakote  . Lithium toxicity   . Neutropenia (HCC)   . Hyperammonemia (HCC) While on Depakote   History reviewed. No pertinent past surgical history.  Social History:  History  Alcohol Use: Not on file     History  Drug Use Not on file    Social History   Social History  . Marital Status: Single    Spouse Name: N/A  . Number of Children: N/A  . Years of Education: N/A   Social History Main Topics  . Smoking status: Unknown If Ever Smoked  . Smokeless tobacco: None  . Alcohol Use: None  . Drug Use: None  . Sexual Activity: Not Asked   Other Topics Concern  . None   Social History Narrative      Current Medications: Current Facility-Administered Medications  Medication Dose Route Frequency Provider Last Rate Last Dose  . acetaminophen (TYLENOL) tablet 650 mg  650 mg Oral Q6H PRN Jimmy FootmanAndrea Hernandez-Gonzalez, MD      . alum & mag hydroxide-simeth (MAALOX/MYLANTA) 200-200-20 MG/5ML suspension 30 mL  30 mL Oral Q4H PRN Jimmy FootmanAndrea Hernandez-Gonzalez, MD      . amantadine (SYMMETREL) capsule 100 mg  100 mg Oral BID Jimmy FootmanAndrea Hernandez-Gonzalez, MD   100 mg at 10/03/15 0859  . aspirin chewable tablet 81 mg  81 mg Oral Daily Jimmy FootmanAndrea Hernandez-Gonzalez, MD   81 mg at 10/03/15 0859  . clonazePAM (KLONOPIN) tablet 0.5 mg  0.5 mg Oral BID Jimmy FootmanAndrea Hernandez-Gonzalez, MD   0.5 mg  at 10/03/15 0859  . haloperidol (HALDOL) tablet 10 mg  10 mg Oral QHS Jimmy Footman, MD   10 mg at 10/02/15 2154  . haloperidol (HALDOL) tablet 5 mg  5 mg Oral Daily Jimmy Footman, MD   5 mg at 10/03/15 0859  . levothyroxine (SYNTHROID, LEVOTHROID) tablet 125 mcg  125 mcg Oral QAC breakfast Jimmy Footman, MD   125 mcg at 10/03/15 0859  . LORazepam (ATIVAN) tablet 2 mg  2 mg Oral Q6H PRN Jimmy Footman, MD   2 mg at 09/24/15 1527   Or  . LORazepam (ATIVAN) injection 2 mg  2 mg Intramuscular Q6H PRN Jimmy Footman, MD      . magnesium hydroxide (MILK OF MAGNESIA) suspension 30 mL  30 mL Oral Daily PRN Jimmy Footman, MD      . metFORMIN (GLUCOPHAGE) tablet 1,000 mg  1,000 mg Oral BID WC Jimmy Footman, MD   1,000 mg at 10/03/15 0859  . OLANZapine (ZYPREXA) tablet 20 mg  20 mg Oral BID Jimmy Footman, MD   20 mg at 10/03/15 0859   Or  . OLANZapine (ZYPREXA) injection 10 mg  10 mg Intramuscular BID Jimmy Footman, MD      . simvastatin (ZOCOR) tablet 20 mg  20 mg Oral q1800 Jimmy Footman, MD   20 mg at 10/02/15 1740  . traZODone (DESYREL) tablet 100 mg  100 mg Oral QHS Jimmy Footman, MD   100 mg at 10/02/15 2149    Lab Results: No results found for this or any previous visit (from the past 48 hour(s)).  Blood Alcohol level:  Lab Results  Component Value Date   ETH <5 09/16/2015    Physical Findings: AIMS: Facial and Oral Movements Muscles of Facial Expression: None, normal, ,  ,  ,    CIWA:    COWS:     Musculoskeletal: Strength & Muscle Tone: within normal limits Gait & Station: normal Patient leans: N/A  Psychiatric Specialty Exam: Review of Systems  Unable to perform ROS Neurological: Negative.     There were no vitals taken for this visit.There is no height or weight on file to calculate BMI.  General Appearance: Disheveled  Eye Contact::  Minimal  Speech:  Normal Rate  Volume:  Decreased  Mood:  Anxious  Affect:  Constricted  Thought Process:  Disorganized  Orientation:  NA  Thought Content:  Disorganized thought processes answers are hyper religious  Suicidal Thoughts:  n/a  Homicidal Thoughts:  n/a  Memory:  NA  Judgement:  Impaired  Insight:  Lacking  Psychomotor Activity:  Normal  Concentration:  NA  Recall:  NA  Fund of Knowledge:NA  Language: Fair  Akathisia:  No  Handed:    AIMS (if indicated):     Assets:  Financial Resources/Insurance Housing Social Support  ADL's:   Intact  Cognition: WNL  Sleep:  Number of Hours: 6.3   Treatment Plan Summary:  Patient was mute today.  Continue to give all oral medications crushed and mixed with apple sauce  Schizophrenia: Continue Zyprexa 20 mg by mouth twice a day along with Haldol at 5 mg po q am and 10 mg po qhs.   Improving slowly, no plans for med changes today.    On non emergency forced medications since 3/21.  Will received zyprexa 10 mg IM if he refuses oral olanzapine.  We will renew non-emergency forced medications today as he at times continued to refuse and has attempted to cheek medications  Anxiety:  Continue clonazepam 0.5 mg po bid  Agitation: Continue Ativan 2 mg by mouth or IM every 6 hours as needed  EPS: continue amantadine 100 mg po bid   Insomnia: Continue trazodone 100 mg by mouth daily at bedtime  Dyslipidemia: Continue Zocor 20 mg by mouth daily  Hypothyroidism: continue syntrhoid 125 mcg/day  Diabetes: Continue Glucophage at   by mouth twice a day  HTN: h/o HTN but unable to check VS.  Will attempt to check VS today  Precautions every 15 minute checks  Diet regular  Hospitalization and status continue involuntary commitment  Disposition: Per Child psychotherapist he is not able to return to prior placement which was an ALF. SW states that [facility] that had previously accepted him no longer has beds available.    Discharge follow-up continue with [facility] services.  Labs: Unable to obtain any labs as patient refuses. Not able to draw lipid panel, hemoglobin A1c, or prolactin levels.  Social issues: Responsible [facility] management entity [facility] provider--- psychotherapeutic services ACT team in [place] Patient's guardian is [facility]  Records [facility] from Nov 2015: 26 and [facility] due to refusing psychotropics. Per records at baseline he is hyper religious and  response to internal stimuli. Patient was discharged on lisinopril 20 mg, aspirin 81 mg, clonazepam 0.5 mg twice a day, metformin at thousand milligrams twice a day, Synthroid 125 g daily, simvastatin 10 mg a day, Risperdal orally disintegrating tablet 4 mg twice a day, olanzapine orally disintegrating tablet 20 mg twice a day  Jimmy Footman, MD 10/03/2015, 9:40 AM

## 2015-10-03 NOTE — Progress Notes (Signed)
   D: Patient is alert and disoriented on the unit this shift. Patient attended and actively participated in groups today. Patient denies suicidal ideation, homicidal ideation, auditory or visual hallucinations at the present time.  A: Scheduled medications are administered to patient as per MD orders. Emotional support and encouragement are provided. Patient is maintained on q.15 minute safety checks. Patient is informed to notify staff with questions or concerns. R: No adverse medication reactions are noted. Patient is cooperative with medication administration and treatment plan today. Patient is calm and cooperative on the unit at this time. Patient does not interacl with others on the unit this shift. . Patient remains safe at this time. ?

## 2015-10-03 NOTE — [facility] Group Notes (Signed)
[facility] LCSW Group Therapy   10/03/2015 11am  Type of Therapy: Group Therapy   Participation Level: Did Not Attend. Patient invited to participate but declined.    Kaye Mitro F. Deovion Batrez, MSW, LCSWA, LCAS   

## 2015-10-03 NOTE — Progress Notes (Signed)
Recreation Therapy Notes  Date: 03.28.17 Time: 3:00 pm Location: Craft Room  Group Topic: Goal Setting  Goal Area(s) Addresses:  Patient will write at least one goal. Patient will write at least one obstacle.  Behavioral Response: Did not attend  Intervention: Recovery Goal Chart  Activity: Patients were instructed to make a Recovery Goal Chart including goals, obstacles, the date they started working on their goals, and the date they achieved their goals.  Education: LRT educated patients on healthy ways to celebrate reaching their goals.  Education Outcome: Patient did not attend group.   Clinical Observations/Feedback: Patient did not attend group.  Jacquelynn CreeGreene,Laurice Iglesia M, LRT/CTRS 10/03/2015 4:24 PM

## 2015-10-03 NOTE — Tx Team (Signed)
Interdisciplinary Treatment Plan Update (Adult)        Date: 10/03/2015   Time Reviewed: 9:30 AM   Progress in Treatment: Improving  Attending groups: No Participating in groups: No Taking medication as prescribed: Yes  Tolerating medication: Yes  Family/Significant other contact made: Yes, CSW has spoken to the pt's group home and the pt's DSS social worker [name] Patient understands diagnosis: Yes  Discussing patient identified problems/goals with staff: Yes  Medical problems stabilized or resolved: Yes  Denies suicidal/homicidal ideation: Yes  Issues/concerns per patient self-inventory: Yes  Other:   New problem(s) identified: N/A   Discharge Plan or Barriers: Pt will be admitted into [facility] for long-term residential treatment, therapy and medication managment   Reason for Continuation of Hospitalization:   Depression   Anxiety   Medication Stabilization   Comments: N/A   Estimated length of stay: 3-5 days             Pt is a 60 year rold male arrived to our ER on 3/11 and was admitted due to concerns of his group home for his mental and physical health.  Pt lives in [place].  [name] is a 6'6" tall, 230 lbs 60 y.o., African American male, who presents to the ER due to his [place] having concerns about his physical and mental health. Majority of the information gathered for this assessment was from patient Medical chart and Group Home Staff.  Patient has a history of stopping his medications and being religious preoccupied.  According to the Group Home, 3 or 4 days prior to coming here the patient show more severe psychotic symptoms. He only has been eating once a day and has been refusing medications. Group Home staff states, they are unsure how long he has gone without his medications. They found some of his medications in his room, when they were cleaning it. Patient usually take his meds in front of staff and is also required to check his mouth. However, he has  learned how to hide the pills in his mouth, so staff can't see them. When he goes to his room, he hides them in his closet. Patient goes in his closet and sit there for several hours "mumbling to his self." In the past, patient was going to his closet to prayer, "because that is what Jesus said in the scriptures."  Patient hygiene has decrease. Patient has refused to bathe and has had on the same clothes for several days. Patient refused to take them off and allow Group Home staff wash them. Patient is currently disheveled and have a strong odor coming from his person.   Upon arrival to the ER it took several staff members to restrain him to get lab work. They were unsuccessful with having him change his clothing.  His labs revealed hypernatremia his sodium was 151. He was in acute renal failure.  Creatinine was 1.52 and his GFR was 56 and BUN 46.  Test results are likely due to dehydration and poor oral intake.  Due to his high level of agitation patient was placed on the waiting list from [facility] where he has been hospitalized a multitude of times in the past. The patient was in our emergency department awaiting for bed there from 3/11 to 3/19.  File decision was made to hospitalize him yesterday after having several days of no aggression, no agitation. The patient per nursing in the emergency room, has been compliant with all his oral medications and has  been eating and drinking with no difficulties. Patient has been refusing daily vitals.    Laboratory work was repeated on March 14. Showing resolution of hypernatremia and acute renal insufficiency.  Patient is currently living at "[facility] [phone])." He moved in 04/2015, [facility] Group Home staff was unsure from what hospital he discharged from.  [place], is his Guardian Gregory [phone]).  Patient currently receives Outpatient Treatment with  [facility] [phone]), with their ACT Team.  Per Dr's notes: I saw this patient today in 2 days prior to this admission while he was in the emergency room. His answers to my questions are irrelevant. Most of the statements he made our about God and God will. He is unable to complete a review of systems form psychiatric and nonpsychiatric symptoms.  Substance abuse history: No known history of substance abuse.  Patient and CSW reviewed pt's identified goals and treatment plan. Pt verbalized understanding and agreed to treatment plan.        Review of initial/current patient goals per problem list:  1. Goal(s): Patient will participate in aftercare plan   Met: No  Target date: 3-5 days post admission date   As evidenced by: Patient will participate within aftercare plan AEB aftercare provider and housing plan at discharge being identified.   3/21: CSW still assessing for appropriate contacts  3/23: Pt will be admitted into [facility] upon discharge for long-term residential treatment, therapy and medication managment     2. Goal (s): Patient will exhibit decreased depressive symptoms and suicidal ideations.   Met: No  Target date: 3-5 days post admission date   As evidenced by: Patient will utilize self-rating of depression at 3 or below and demonstrate decreased signs of depression or be deemed stable for discharge by MD.   3/21: Goal progressing.  3/23: Goal progressing.    3. Goal(s): Patient will demonstrate decreased signs and symptoms of anxiety.   Met: No  Target date: 3-5 days post admission date   As evidenced by: Patient will utilize self-rating of anxiety at 3 or below and demonstrated decreased signs of anxiety, or be deemed stable for discharge by MD   3/21: Goal progressing  3/23: Goal progressing.    4. Goal(s): Patient will demonstrate decreased signs of psychosis  * Met: No * Target date: 3-5 days post admission date  * As evidenced by:  Patient will demonstrate decreased frequency of AVH or return to baseline function   3/21: Goal progressing.  3/23: Goal progressing.    Attendees:  Patient:  Family:  Physician: Dr. Jerilee Hoh, MD     09/28/2015 9:30 AM  Nursing: Nicanor Bake, RN      09/28/2015 9:30 AM  Clinical Social Worker: Marylou Flesher, [place]   09/28/2015 9:30 AM  Clinical Social Worker:     09/28/2015 9:30 AM  Clinical Social Worker:     09/28/2015 9:30 AM  Nursing: Carolynn Sayers, RN     09/28/2015 9:30 AM  Nursing:  Polly Cobia , RN     09/28/2015 9:30 AM

## 2015-10-03 NOTE — [facility] Group Notes (Signed)
[facility] Group Notes:  (Nursing/MHT/Case Management/Adjunct)  Date:  10/03/2015  Time:  1:56 PM  Type of Therapy:  Psychoeducational Skills  Participation Level:  Did Not Attend  Jerricka Carvey C Jakoby Melendrez 10/03/2015, 1:56 PM 

## 2015-10-04 MED ORDER — LISINOPRIL 2.5 MG PO TABS: 2.5000 mg | ORAL_TABLET | Freq: Every day | ORAL | Status: DC

## 2015-10-04 MED ORDER — TUBERCULIN PPD 5 UNIT/0.1ML ID SOLN
5.0000 [IU] | Freq: Once | INTRADERMAL | Status: AC
  Administered 2015-10-04: 5 [IU] via INTRADERMAL
  Filled 2015-10-04: qty 0.1

## 2015-10-04 MED ORDER — AMANTADINE HCL 100 MG PO CAPS
100.0000 mg | ORAL_CAPSULE | Freq: Two times a day (BID) | ORAL | Status: DC
Start: 2015-10-04 — End: 2018-12-10

## 2015-10-04 MED ORDER — LEVOTHYROXINE SODIUM 125 MCG PO TABS: 125.0000 ug | ORAL_TABLET | Freq: Every day | ORAL | Status: DC

## 2015-10-04 MED ORDER — SIMVASTATIN 20 MG PO TABS: 20.0000 mg | ORAL_TABLET | Freq: Every day | ORAL | Status: DC

## 2015-10-04 MED ORDER — OLANZAPINE 20 MG PO TABS: 20.0000 mg | ORAL_TABLET | Freq: Two times a day (BID) | ORAL | Status: DC

## 2015-10-04 MED ORDER — METFORMIN HCL 1000 MG PO TABS: 1000.0000 mg | ORAL_TABLET | Freq: Two times a day (BID) | ORAL | Status: AC

## 2015-10-04 MED ORDER — CLONAZEPAM 0.5 MG PO TABS: 0.5000 mg | ORAL_TABLET | Freq: Two times a day (BID) | ORAL | Status: DC

## 2015-10-04 MED ORDER — ASPIRIN 81 MG PO CHEW: 81.0000 mg | CHEWABLE_TABLET | Freq: Every day | ORAL | Status: DC

## 2015-10-04 MED ORDER — HALOPERIDOL 10 MG PO TABS
10.0000 mg | ORAL_TABLET | Freq: Two times a day (BID) | ORAL | Status: DC
Start: 2015-10-04 — End: 2019-08-18

## 2015-10-04 NOTE — [place] FL2 (Signed)
[place] MEDICAID [id number] LEVEL OF CARE SCREENING TOOL     IDENTIFICATION  Patient Name: [name] Birthdate: 04/23/1956 Sex: male Admission Date (Current Location): 09/24/2015  [place]  [place] [id number] [facility] Facility and Address:  [facility], [address], [place], [place] [address]      Provider Number: [id number]  Attending Physician Name and Address:  Barnabas HarriesAndrea Hernandez-Gonzale*  Relative Name and Phone Number:  Dorene SorrowMONSANTO,HASSANI at ph: [phone]    Current Level of Care: Hospital Recommended Level of Care: Assisted Living Facility Prior Approval Number:    Date Approved/Denied:   PASRR Number: [id number]  Discharge Plan: Other (Comment)    Current Diagnoses: Patient Active Problem List   Diagnosis Date Noted  . Dyslipidemia 09/25/2015  . Undifferentiated schizophrenia (HCC)   . Diabetes (HCC) 09/16/2015  . Hypothyroid 09/16/2015    Orientation RESPIRATION BLADDER Height & Weight     Self  Normal Continent Weight:   Height:     BEHAVIORAL SYMPTOMS/MOOD NEUROLOGICAL BOWEL NUTRITION STATUS      Continent Diet (Pt is a diabetic)  AMBULATORY STATUS COMMUNICATION OF NEEDS Skin   Independent Verbally Normal                       Personal Care Assistance Level of Assistance    Bathing Assistance: Independent         Functional Limitations Info             SPECIAL CARE FACTORS FREQUENCY                       Contractures Contractures Info: Not present    Additional Factors Info                  Current Medications (10/04/2015):  This is the current hospital active medication list Current Facility-Administered Medications  Medication Dose Route Frequency Provider Last Rate Last Dose  . acetaminophen (TYLENOL) tablet 650 mg  650 mg Oral Q6H PRN Jimmy FootmanAndrea Hernandez-Gonzalez, MD      . alum & mag hydroxide-simeth (MAALOX/MYLANTA) 200-200-20 MG/5ML suspension 30 mL  30 mL Oral Q4H PRN  Jimmy FootmanAndrea Hernandez-Gonzalez, MD      . amantadine (SYMMETREL) capsule 100 mg  100 mg Oral BID WC Jimmy FootmanAndrea Hernandez-Gonzalez, MD   100 mg at 10/04/15 0749  . aspirin chewable tablet 81 mg  81 mg Oral Q breakfast Jimmy FootmanAndrea Hernandez-Gonzalez, MD   81 mg at 10/04/15 0749  . clonazePAM (KLONOPIN) tablet 0.5 mg  0.5 mg Oral BID WC Jimmy FootmanAndrea Hernandez-Gonzalez, MD   0.5 mg at 10/04/15 0749  . haloperidol (HALDOL) tablet 10 mg  10 mg Oral Q supper Jimmy FootmanAndrea Hernandez-Gonzalez, MD   10 mg at 10/03/15 1720  . haloperidol (HALDOL) tablet 5 mg  5 mg Oral Q breakfast Jimmy FootmanAndrea Hernandez-Gonzalez, MD   5 mg at 10/04/15 0749  . levothyroxine (SYNTHROID, LEVOTHROID) tablet 125 mcg  125 mcg Oral QAC breakfast Jimmy FootmanAndrea Hernandez-Gonzalez, MD   125 mcg at 10/04/15 [id number]  . LORazepam (ATIVAN) tablet 2 mg  2 mg Oral Q6H PRN Jimmy FootmanAndrea Hernandez-Gonzalez, MD   2 mg at 09/24/15 1527   Or  . LORazepam (ATIVAN) injection 2 mg  2 mg Intramuscular Q6H PRN Jimmy FootmanAndrea Hernandez-Gonzalez, MD      . magnesium hydroxide (MILK OF MAGNESIA) suspension 30 mL  30 mL Oral Daily PRN Jimmy FootmanAndrea Hernandez-Gonzalez, MD      . metFORMIN (GLUCOPHAGE) tablet 1,000 mg  1,000 mg Oral  BID WC Jimmy Footman, MD   1,000 mg at 10/04/15 0749  . OLANZapine (ZYPREXA) tablet 20 mg  20 mg Oral BID WC Jimmy Footman, MD   20 mg at 10/04/15 0749   Or  . OLANZapine (ZYPREXA) injection 10 mg  10 mg Intramuscular BID WC Jimmy Footman, MD      . simvastatin (ZOCOR) tablet 20 mg  20 mg Oral q1800 Jimmy Footman, MD   20 mg at 10/03/15 1722  . tuberculin injection 5 Units  5 Units Intradermal Once Jimmy Footman, MD         Discharge Medications: Please see discharge summary for a list of discharge medications.  Relevant Imaging Results:  Relevant Lab Results:   Additional Information Pt was not taking his meds at his former group home and has been admitted in order to stabilize on medications  Dorothe Pea Trapper Meech,  LCSW

## 2015-10-04 NOTE — [facility] Group Notes (Signed)
[facility] Group Notes:  (Nursing/MHT/Case Management/Adjunct)  Date:  10/04/2015  Time:  11:44 PM  Type of Therapy:  Evening Wrap-up Group  Participation Level:  Did Not Attend  Participation Quality:  N/A  Affect:  N/A  Cognitive:  N/A  Insight:  None  Engagement in Group:  N/A  Modes of Intervention:  N/A  Summary of Progress/Problems:  Tomasita MorrowChelsea Nanta Siomara Burkel 10/04/2015, 11:44 PM

## 2015-10-04 NOTE — [facility] Group Notes (Signed)
[facility] Group Notes:  (Nursing/MHT/Case Management/Adjunct)  Date:  10/04/2015  Time:  1:59 PM  Type of Therapy:  Group Therapy  Participation Level:  Did Not Attend  Participation Quality:  Summary of Progress/Problems:  [name] 10/04/2015, 1:59 PM

## 2015-10-04 NOTE — Discharge Summary (Addendum)
Physician Discharge Summary Note  Patient:  [name] is an 60 y.o., male MRN:  [id number] DOB:  11-26-1955 Patient phone:  [phone] (home)  Patient address:   [facility], [place] [address],  Total Time spent with patient: 45 minutes  Date of Admission:  09/24/2015 Date of Discharge: 10/10/2015  Reason for Admission:  Dehydration, hyperreligiosity, delusional thinking, psychosis  Principal Problem: Undifferentiated schizophrenia [facility]) Discharge Diagnoses: Patient Active Problem List   Diagnosis Date Noted  . HTN (hypertension) [I10] 10/10/2015  . Dyslipidemia [E78.5] 09/25/2015  . Undifferentiated schizophrenia ([place]) [F20.3]   . Diabetes ([place]) [E11.9] 09/16/2015  . Hypothyroid [E03.9] 09/16/2015   History of Present Illness:  Pt arrived to our ER on 3/11. [name] is a 6'6" tall, 230 lbs 60 y.o., African American male, who presents to the ER due to his [place] having concerns about his physical and mental health. Majority of the information gathered for this assessment was from patient Medical chart and Group Home Staff. Patient has a history of stopping his medications and being religious preoccupied.  According to the Group Home, 3 or 4 days prior to coming here the patient show more severe psychotic symptoms. He only has been eating once a day and has been refusing medications. Group Home staff states, they are unsure how long he has gone without his medications. They found some of his medications in his room, when they were cleaning it. Patient usually take his meds in front of staff and is also required to check his mouth. However, he has learned how to hide the pills in his mouth, so staff can't see them. When he goes to his room, he hides them in his closet. Patient goes in his closet and sit there for several hours "mumbling to his self." In the past, patient was going to his closet to prayer, "because that is what Jesus said in the  scriptures."  Patient hygiene has decrease. Patient has refused to bathe and has had on the same clothes for several days. Patient refused to take them off and allow Group Home staff wash them. Patient is currently disheveled and have a strong odor coming from his person.   Upon arrival to the ER it took several staff members to restrain him to get lab work. They were unsuccessful with having him change his clothing.  His labs revealed hypernatremia his sodium was 151. He was in acute renal failure. Creatinine was 1.52 and his GFR was 56 and BUN 46. Test results are likely due to dehydration and poor oral intake.  Due to his high level of agitation patient was placed on the waiting list from [facility] where he has been hospitalized a multitude of times in the past. The patient was in our emergency department awaiting for bed there from 3/11 to 3/19. File decision was made to hospitalize him yesterday after having several days of no aggression, no agitation. The patient per nursing in the emergency room, has been compliant with all his oral medications and has been eating and drinking with no difficulties. Patient has been refusing daily vitals.   Laboratory work was repeated on March 14. Showing resolution of hypernatremia and acute renal insufficiency.   Patient is currently living at "[phone])." He moved in 04/2015, [facility] Group Home staff was unsure from what hospital he discharged from. [place], is his Guardian [name]). Patient currently receives  Outpatient Treatment with [facility] [phone]), with their ACT Team.  I saw this patient today in 2 days prior to this admission while he was in the emergency room. His answers to my questions are irrelevant. Most of the statements he made our about God and God will. He is unable to complete a  review of systems form psychiatric and nonpsychiatric symptoms.  Substance abuse history: No known history of substance abuse.  Associated Signs/Symptoms: Depression Symptoms: Unable to assess (Hypo) Manic Symptoms: Impulsivity, Anxiety Symptoms: Unable to assess Psychotic Symptoms: Disorganized behaviors, poor hygiene, poor self-care, hyper religious concerns PTSD Symptoms: NA   Past Medical History:  Past Medical History  Diagnosis Date  . Schizophrenia ([place])   . Diabetes mellitus without complication ([place])   . Thyroid disease   . Hyperlipemia   . Sickle cell trait ([place])   . HTN (hypertension)   . TIA (transient ischemic attack) in 2009  . Seizure ([place]) While toxic on Depakote  . Lithium toxicity   . Neutropenia ([place])   . Hyperammonemia ([facility]) While on Depakote   History reviewed. No pertinent past surgical history.  Family History: Unknown family history of mental illness or suicide  Social History:  History  Alcohol Use: Not on file     History  Drug Use Not on file    Social History   Social History  . Marital Status: Single    Spouse Name: N/A  . Number of Children: N/A  . Years of Education: N/A   Social History Main Topics  . Smoking status: Unknown If Ever Smoked  . Smokeless tobacco: None  . Alcohol Use: None  . Drug Use: None  . Sexual Activity: Not Asked   Other Topics Concern  . None   Social History Narrative    Hospital Course:     Schizophrenia: Continue Zyprexa 20 mg by mouth twice a day along with Haldol which has been increased to 10 mg po bid.  With this combination the patient became more cooperative. His thought process was more organized however he is unable to engage in a conversation, he tends to perseverate on issues. During this hospitalization he was focused on getting $700 that he says were left to him by his mother and used to be a Pharmacist, hospital. The patient says he wants that money in order to pursue his education. Patient  frequently repeats "God will help" instead of answering certain questions.  Pt started on  non emergency forced medications on 3/21. Patient did not require any injectable during his stay the psychiatric unit. The patient however was frequently argumentative with the nurses and he try to cheek medications at times. He seems to be more likely to take medications when they're given to him along with ice cream or applesauce. Nurses stated that when the medications are given with the applesauce or the ice cream and they don't need to crush them.    Anxiety: Continue clonazepam 0.5 mg po bid  EPS: continue amantadine 100 mg po bid   Dyslipidemia: Continue Zocor 20 mg by mouth daily  Hypothyroidism: continue syntrhoid 125 mcg/day  Diabetes: Continue Glucophage at 1024m by mouth twice a day  HTN: Patient was treated with lisinopril however we were not able to check vital signs. I will start him on lisinopril 2.5 mg by mouth daily  Diet regular: Even though patient has diagnosis of diabetes and hypertension with decided not to change his diet as prior to admission he had not been eating for  a prolonged period of time. During his stay here unit the patient ate about 90-100% of all his meals  Disposition: Will be d/c back to his previous [facility] who has accepted to take him back. DSS will come and pick pt up from the hospital.   Discharge follow-up: continue with [facility] ACT services.  Labs: Unable to obtain any labs as patient refuses. Not able to draw lipid panel, hemoglobin A1c, or prolactin levels.  Resources: Responsible local management entity ---[facility] [place] provider--- [facility] in [place] Patient's guardian is [facility]  Records from [facility] from Nov 2015:  [facility] due to refusing psychotropics. Per records at baseline he is hyper religious and response  to internal stimuli. Patient was discharged on lisinopril 20 mg, aspirin 81 mg, clonazepam 0.5 mg twice a day, metformin at thousand milligrams twice a day, Synthroid 125 g daily, simvastatin 10 mg a day, Risperdal orally disintegrating tablet 4 mg twice a day, olanzapine orally disintegrating tablet 20 mg twice a day   Patient required  forced labs in the emergency department and forced medication in one occasion.  Patient was in the emergency department for about 2 weeks prior to his admission to the unit.   While in the unit patient did not require seclusion, or restraints. Orders were given for nonemergency force medications however patient did not require any injectables during his stay.  He refused all labs and vital signs during his sustain the psychiatric unit.  He frequently answers questions with "God will help" but is not as hyper religious as he was prior to admission.    The patient has not displayed any aggression or agitation towards staff or peers. The patient did not participated in any programming. Hygiene and grooming are poor.   Physical Findings: AIMS: Facial and Oral Movements Muscles of Facial Expression: None, normal, ,  ,  ,    CIWA:    COWS:     Musculoskeletal: Strength & Muscle Tone: within normal limits Gait & Station: normal Patient leans: N/A  Psychiatric Specialty Exam: Review of Systems  Unable to perform ROS   There were no vitals taken for this visit.There is no height or weight on file to calculate BMI.  General Appearance: Disheveled  Eye Sport and exercise psychologist::  Fair  Speech:  Garbled  Volume:  Normal  Mood:  Irritable  Affect:  Constricted  Thought Process:  Patient perseverates on issues. He is fixated on getting $700 that he thinks his mother left him he was too used his money for his education  Orientation:  Full (Time, Place, and Person)  Thought Content:  Delusions  Suicidal Thoughts:  No  Homicidal Thoughts:  No  Memory:  Immediate;    Poor Recent;   Poor Remote;   Fair  Judgement:  Impaired  Insight:  Lacking  Psychomotor Activity:  Normal  Concentration:  Fair  Recall:  Poor  Fund of Knowledge:Poor  Language: Poor  Akathisia:  No  Handed:    AIMS (if indicated):     Assets:  Financial Resources/Insurance Housing Social Support  ADL's:  Intact  Cognition: Impaired,  Mild  Sleep:  Number of Hours: 6   Have you used any form of tobacco in the last 30 days? (Cigarettes, Smokeless Tobacco, Cigars, and/or Pipes): Patient Refused Screening  Has this patient used any form of tobacco in the last 30 days? (Cigarettes, Smokeless Tobacco, Cigars, and/or Pipes) Yes, N/A--patient  will not tell us where his smokes are not  Blood Alcohol level:  Lab Results  Component Value Date   [facility] <5 09/16/2015   Results for JABRI, BLANCETT (MRN [id number]) as of 10/04/2015 12:11  Ref. Range 09/16/2015 04:15 09/16/2015 12:14 09/16/2015 16:55 09/17/2015 16:55 09/19/2015 18:56  Sodium Latest Ref Range: 135-145 mmol/L 151 (H) 145   138  Potassium Latest Ref Range: 3.5-5.1 mmol/L 4.5    3.8  Chloride Latest Ref Range: 101-111 mmol/L 114 (H)    105  CO2 Latest Ref Range: 22-32 mmol/L 29    30  BUN Latest Ref Range: 6-20 mg/dL 46 (H)    15  Creatinine Latest Ref Range: 0.61-1.24 mg/dL 1.52 (H)    0.93  Calcium Latest Ref Range: 8.9-10.3 mg/dL 9.4    8.5 (L)  EGFR (Non-African Amer.) Latest Ref Range: >60 mL/min 48 (L)    >60  EGFR (African American) Latest Ref Range: >60 mL/min 56 (L)    >60  Glucose Latest Ref Range: 65-99 mg/dL 146 (H)    100 (H)  Anion gap Latest Ref Range: 5-'15  8    3 ' (L)  Alkaline Phosphatase Latest Ref Range: 38-126 U/L 44    38  Albumin Latest Ref Range: 3.5-5.0 g/dL 4.4    3.7  AST Latest Ref Range: 15-41 U/L 24    20  ALT Latest Ref Range: 17-63 U/L 12 (L)    10 (L)  Total Protein Latest Ref Range: 6.5-8.1 g/dL 7.9    6.3 (L)  Total Bilirubin Latest Ref Range: 0.3-1.2 mg/dL 0.7    0.4  WBC Latest Ref Range: 3.8-10.6  K/uL 7.3    3.4 (L)  RBC Latest Ref Range: 4.40-5.90 MIL/uL 5.12    4.43  Hemoglobin Latest Ref Range: 13.0-18.0 g/dL 13.4    11.6 (L)  HCT Latest Ref Range: 40.0-52.0 % 40.9    35.1 (L)  MCV Latest Ref Range: 80.0-100.0 fL 79.9 (L)    79.2 (L)  MCH Latest Ref Range: 26.0-34.0 pg 26.1    26.1  MCHC Latest Ref Range: 32.0-36.0 g/dL 32.7    33.0  RDW Latest Ref Range: 11.5-14.5 % 13.9    13.3  Platelets Latest Ref Range: 150-440 K/uL 233    183  Neutrophils Latest Units: % 68    47  Lymphocytes Latest Units: % 18    37  Monocytes Relative Latest Units: % 12    9  Eosinophil Latest Units: % 0    6  Basophil Latest Units: % 2    1  NEUT# Latest Ref Range: 1.4-6.5 K/uL 5.0    1.6  Lymphocyte # Latest Ref Range: 1.0-3.6 K/uL 1.3    1.2  Monocyte # Latest Ref Range: 0.2-1.0 K/uL 0.9    0.3  Eosinophils Absolute Latest Ref Range: 0-0.7 K/uL 0.0    0.2  Basophils Absolute Latest Ref Range: 0-0.1 K/uL 0.2 (H)    0.0  Acetaminophen (Tylenol), S Latest Ref Range: 10-30 ug/mL <10 (L)      Carbamazepine Lvl Latest Ref Range: 4.0-12.0 ug/mL <1.6 (L)      Salicylate Lvl Latest Ref Range: 2.8-30.0 mg/dL <4.0      Appearance Latest Ref Range: CLEAR     CLEAR (A)   Bacteria, UA Latest Ref Range: NONE SEEN     NONE SEEN   Bilirubin Urine Latest Ref Range: NEGATIVE     NEGATIVE   Color, Urine Latest Ref Range: YELLOW     YELLOW (  A)   Glucose Latest Ref Range: NEGATIVE mg/dL    NEGATIVE   Hgb urine dipstick Latest Ref Range: NEGATIVE     NEGATIVE   Ketones, ur Latest Ref Range: NEGATIVE mg/dL    NEGATIVE   Leukocytes, UA Latest Ref Range: NEGATIVE     NEGATIVE   Mucous Unknown    PRESENT   Nitrite Latest Ref Range: NEGATIVE     NEGATIVE   pH Latest Ref Range: 5.0-8.0     5.0   Protein Latest Ref Range: NEGATIVE mg/dL    30 (A)   RBC / HPF Latest Ref Range: 0-5 RBC/hpf    0-5   Specific Gravity, Urine Latest Ref Range: 1.005-1.030     1.027   Squamous Epithelial / LPF Latest Ref Range: NONE SEEN     0-5  (A)   WBC, UA Latest Ref Range: 0-5 WBC/hpf    0-5   Osmolality, Urine Latest Ref Range: 300-900 mOsm/kg    874   Sodium, Urine Latest Units: mmol/L   24    Alcohol, Ethyl (B) Latest Ref Range: <5 mg/dL <5      Amphetamines, Ur Screen Latest Ref Range: NONE DETECTED     NONE DETECTED   Barbiturates, Ur Screen Latest Ref Range: NONE DETECTED     NONE DETECTED   Benzodiazepine, Ur Scrn Latest Ref Range: NONE DETECTED     POSITIVE (A)   Cocaine Metabolite,Ur [place] Latest Ref Range: NONE DETECTED     NONE DETECTED   Methadone Scn, Ur Latest Ref Range: NONE DETECTED     NONE DETECTED   MDMA (Ecstasy)Ur Screen Latest Ref Range: NONE DETECTED     NONE DETECTED   Cannabinoid 50 Ng, Ur [place] Latest Ref Range: NONE DETECTED     NONE DETECTED   Opiate, Ur Screen Latest Ref Range: NONE DETECTED     NONE DETECTED   Phencyclidine (PCP) Ur S Latest Ref Range: NONE DETECTED     NONE DETECTED   Tricyclic, Ur Screen Latest Ref Range: NONE DETECTED     NONE DETECTED    Metabolic Disorder Labs: Patient refuses blood work No results found for: HGBA1C, MPG No results found for: PROLACTIN No results found for: CHOL, TRIG, HDL, CHOLHDL, VLDL, LDLCALC  See Psychiatric Specialty Exam and Suicide Risk Assessment completed by Attending Physician prior to discharge.  Discharge destination:  Other:  Group home  Is patient on multiple antipsychotic therapies at discharge:  Yes,   Do you recommend tapering to monotherapy for antipsychotics?  No   Has Patient had three or more failed trials of antipsychotic monotherapy by history:  Yes,   Antipsychotic medications that previously failed include:   1.  Olanzapine., 2.  Haldol. and 3.  Prolixin.  Recommended Plan for Multiple Antipsychotic Therapies: Additional reason(s) for multiple antispychotic treatment:  Patient will refuse long-acting injectables. Patient will refuse Clozaril     Medication List    STOP taking these medications        benztropine 1 MG tablet   Commonly known as:  COGENTIN     polyethylene glycol powder powder  Commonly known as:  GLYCOLAX/MIRALAX     risperiDONE 3 MG tablet  Commonly known as:  RISPERDAL      TAKE these medications      Indication   amantadine 100 MG capsule  Commonly known as:  SYMMETREL  Take 1 capsule (100 mg total) by mouth 2 (two) times daily with a meal.   Indication:  Extrapyramidal Reaction caused by Medications     aspirin 81 MG chewable tablet  Chew 1 tablet (81 mg total) by mouth daily with breakfast.   Indication:  cardivascular health     clonazePAM 0.5 MG tablet  Commonly known as:  KLONOPIN  Take 1 tablet (0.5 mg total) by mouth 2 (two) times daily with a meal.   Indication:  anxiety     haloperidol 10 MG tablet  Commonly known as:  HALDOL  Take 1 tablet (10 mg total) by mouth 2 (two) times daily.   Indication:  Schizophrenia     levothyroxine 125 MCG tablet  Commonly known as:  SYNTHROID, LEVOTHROID  Take 1 tablet (125 mcg total) by mouth daily before breakfast.   Indication:  Underactive Thyroid     lisinopril 2.5 MG tablet  Commonly known as:  PRINIVIL,ZESTRIL  Take 1 tablet (2.5 mg total) by mouth daily.   Indication:  High Blood Pressure     metFORMIN 1000 MG tablet  Commonly known as:  GLUCOPHAGE  Take 1 tablet (1,000 mg total) by mouth 2 (two) times daily with a meal.   Indication:  Type 2 Diabetes     OLANZapine 20 MG tablet  Commonly known as:  ZYPREXA  Take 1 tablet (20 mg total) by mouth 2 (two) times daily with a meal.   Indication:  Schizophrenia     simvastatin 20 MG tablet  Commonly known as:  ZOCOR  Take 1 tablet (20 mg total) by mouth daily at 6 PM.  Notes to Patient:  Cholesterol        Follow-up Information    Follow up with [facility].   Why:  Please arrive for your hospital follow up on Wednesday April the 5th at 1:20pm with Dr. Gwynneth Aliment for your medical examination and with Dr. Eugenia Mcalpine for medication managment  and an assessment for therapy.  Please bring your discharge paperwork.     Contact information:   [address] [place], [place] [address] Phone: [phone] Fax: [phone]       Follow up with [facility].      >30 minutes. >50 % of the time was spent in coordination of care.  Signed: Hildred Priest, MD 10/10/2015, 9:05 AM

## 2015-10-04 NOTE — Plan of Care (Signed)
Problem: Alteration in thought process Goal: STG-Patient does not respond to command hallucinations Outcome: Progressing Pt still having disorganized thoughts, religiously preoccupied.

## 2015-10-04 NOTE — Progress Notes (Signed)
Recreation Therapy Notes  Date: 03.29.17 Time: 3:00 pm Location: Craft Room  Group Topic: Self-esteem  Goal Area(s) Addresses:  Patient will be able to identify benefit of self-esteem. Patient will be able to identify ways to increase self-esteem.  Behavioral Response: Did not attend  Intervention: Self-Portrait  Activity: Patients were instructed to draw their self-portrait of how they felt. Afterwards, patients were instructed to write their name and positive trait about themselves. They passed their papers around and wrote positive traits about their peers. Patient read what their peers wrote and drew their self-portrait of how they felt after reading the positive traits.  Education: LRT educated patients on ways they can increase their self-esteem.  Education Outcome: Patient did not attend group.  Clinical Observations/Feedback: Patient did not attend group.  Jacquelynn CreeGreene,Nasya Vincent M, LRT/CTRS 10/04/2015 3:46 PM

## 2015-10-04 NOTE — Progress Notes (Signed)
Patient ID: [name], male   DOB: [birth year], 60 y.o.   MRN: [id number] CSW has found a placement for the pt at [facility] and they are planning to pick the pt up on 10/05/15 at 12 or 1pm.  CSW spoke to the pt's legal guardian [name] at [phone] and was informed that she would "run it by" her supervisor, but that she would inform the group home that the pt was noncompliant with being medically examined historically and that the group home must be made aware of that.  Miss [name] the said she would inform the CSW eithe 3/29 or 3/30 on rather she would agree with the pt's placement into the group home.

## 2015-10-04 NOTE — Progress Notes (Signed)
D:  Pt not able to fill out self inventory sheet, denies SI/HI/AVH, disorganized thoughts, religiously preoccupied, pt is med compliant but would not open mouth for staff to see that he swallowed the medication, pt states "You are trying to be God"     A:  Emotional support provided, Encouraged pt to continue with treatment plan and attend all group activities, q15 min checks maintained for safety.  R:  Pt is not going to groups,isolates and stays to himself but does come out for meals.

## 2015-10-04 NOTE — Progress Notes (Signed)
Patient ID: [name], male   DOB: 1955/09/09, 60 y.o.   MRN: [id number] CSw made an attempt to complete a PSA on the pt but the pt was unable to answer any questions from the pt.

## 2015-10-04 NOTE — [facility] Suicide Risk Assessment (Addendum)
[facility]   Principal Problem: Undifferentiated schizophrenia [facility](HCC) Discharge Diagnoses:  Patient Active Problem List   Diagnosis Date Noted  . Dyslipidemia [E78.5] 09/25/2015  . Undifferentiated schizophrenia (HCC) [F20.3]   . Diabetes (HCC) [E11.9] 09/16/2015  . Hypothyroid [E03.9] 09/16/2015    Psychiatric Specialty Exam: ROS  There were no vitals taken for this visit.There is no height or weight on file to calculate BMI.                                                       Mental Status Per Nursing Assessment::   On Admission:  NA  Demographic Factors:  Male  Loss Factors: NA  Historical Factors: Impulsivity  Risk Reduction Factors:   Living with another person, especially a relative and Positive social support  Continued Clinical Symptoms:  Schizophrenia:   Paranoid or undifferentiated type Previous Psychiatric Diagnoses and Treatments  Cognitive Features That Contribute To Risk:  Loss of executive function    Suicide Risk:  Minimal: No identifiable suicidal ideation.  Patients presenting with no risk factors but with morbid ruminations; may be classified as minimal risk based on the severity of the depressive symptoms  Follow-up Information    [facility]   Why:  Please arrive for your hospital follow up on Wednesday April the 5th at 1:20pm with Dr. Sallee LangeSoles for your medical examination and with Dr. Alisia FerrariElizabeth Childs for medication managment and an assessment for therapy.  Please bring your discharge paperwork.     Contact information:   [address] [place] [address] Phone: [phone](336) [phone] Fax: [phone](336) [phone]       Follow up with [facility].       Jimmy FootmanHernandez-Gonzalez,  Emmilee Reamer, MD 10/10/2015, 9:03 AM

## 2015-10-04 NOTE — Progress Notes (Signed)
No discharge placement available for patient. Patient will not discharge today. Patient's valuable envelope to be returned to security.

## 2015-10-04 NOTE — [facility] Group Notes (Signed)
[facility] LCSW Group Therapy   10/04/2015 1pm  Type of Therapy: Group Therapy   Participation Level: Did Not Attend. Patient invited to participate but declined.    Chanta Bauers F. Dillon Mcreynolds, MSW, LCSWA, LCAS   

## 2015-10-04 NOTE — [facility] Group Notes (Signed)
[facility] LCSW Aftercare Discharge Planning Group Note  10/04/2015 9:30 AM  Participation Quality: Did Not Attend. Patient invited to participate but declined.   Jackqueline Aquilar F. Duyen Beckom, MSW, LCSWA, LCAS   

## 2015-10-04 NOTE — Progress Notes (Signed)
Patient takes evening meds with applesauce during dinner time. Sits and watches tv after meal.

## 2015-10-05 MED ORDER — HALOPERIDOL 5 MG PO TABS
10.0000 mg | ORAL_TABLET | Freq: Two times a day (BID) | ORAL | Status: DC
  Administered 2015-10-06 – 2015-10-10 (×8): 10 mg via ORAL
  Filled 2015-10-05 (×8): qty 2

## 2015-10-05 NOTE — Progress Notes (Signed)
Patient ID: [name], male   DOB: Jul 19, 1955, 60 y.o.   MRN: [id number]  [facility] MD Progress Note  10/05/2015 2:12 PM [name]  MRN:  [id number] Subjective:  Patient once seen walking from his room to the day room. I call his name a couple times in order to complete an assessment. The patient ignore me and kept walking.  Compliant with meds. Oral intake is around 100%. Patient is more cooperative with Child psychotherapist. Patient is okay with going to a group home. I spoke today with DSS, they tell me they're actively looking for a appropriate placement.  Per discussion in treatment team t medications to be given with breakfast and with dinner. The patient will be giving the medications with applesauce or with ice cream. Patient tends to like when the medications are mixed.   Grooming and hygiene are limited. Nursing staff states that he has what she is close and has been in the shower a couple times.  Body color is not as intense today  Compliance with unit regulations: Refuses vital signs and refuses labs.  Therapy: Withdrawn to his room. Patient is not participating in any type of therapy  Medication compliance: Patient maintains compliance  Oral intake: 100% yesterday.  Per nursing: Not Progressing.  Patient not oriented to person,place or time. Words not appropriate garbled speech  Principal Problem: Undifferentiated schizophrenia (HCC) Diagnosis:   Patient Active Problem List   Diagnosis Date Noted  . Dyslipidemia [E78.5] 09/25/2015  . Undifferentiated schizophrenia (HCC) [F20.3]   . Diabetes (HCC) [E11.9] 09/16/2015  . Hypothyroid [E03.9] 09/16/2015   Total Time spent with patient: 30 min    Past Medical History:  Past Medical History  Diagnosis Date  . Schizophrenia (HCC)   . Diabetes mellitus without complication (HCC)   . Thyroid disease   . Hyperlipemia   . Sickle cell trait (HCC)   . HTN (hypertension)   . TIA (transient ischemic attack) in 2009  . Seizure (HCC)  While toxic on Depakote  . Lithium toxicity   . Neutropenia (HCC)   . Hyperammonemia (HCC) While on Depakote   History reviewed. No pertinent past surgical history.  Social History:  History  Alcohol Use: Not on file     History  Drug Use Not on file    Social History   Social History  . Marital Status: Single    Spouse Name: N/A  . Number of Children: N/A  . Years of Education: N/A   Social History Main Topics  . Smoking status: Unknown If Ever Smoked  . Smokeless tobacco: None  . Alcohol Use: None  . Drug Use: None  . Sexual Activity: Not Asked   Other Topics Concern  . None   Social History Narrative      Current Medications: Current Facility-Administered Medications  Medication Dose Route Frequency Provider Last Rate Last Dose  . acetaminophen (TYLENOL) tablet 650 mg  650 mg Oral Q6H PRN Jimmy Footman, MD      . alum & mag hydroxide-simeth (MAALOX/MYLANTA) 200-200-20 MG/5ML suspension 30 mL  30 mL Oral Q4H PRN Jimmy Footman, MD      . amantadine (SYMMETREL) capsule 100 mg  100 mg Oral BID WC Jimmy Footman, MD   100 mg at 10/05/15 0750  . aspirin chewable tablet 81 mg  81 mg Oral Q breakfast Jimmy Footman, MD   81 mg at 10/05/15 0750  . clonazePAM (KLONOPIN) tablet 0.5 mg  0.5 mg Oral BID WC Jimmy Footman, MD  0.5 mg at 10/05/15 0750  . haloperidol (HALDOL) tablet 10 mg  10 mg Oral BID Jimmy FootmanAndrea Hernandez-Gonzalez, MD      . levothyroxine (SYNTHROID, LEVOTHROID) tablet 125 mcg  125 mcg Oral QAC breakfast Jimmy FootmanAndrea Hernandez-Gonzalez, MD   125 mcg at 10/05/15 [phone]  . LORazepam (ATIVAN) tablet 2 mg  2 mg Oral Q6H PRN Jimmy FootmanAndrea Hernandez-Gonzalez, MD   2 mg at 09/24/15 1527   Or  . LORazepam (ATIVAN) injection 2 mg  2 mg Intramuscular Q6H PRN Jimmy FootmanAndrea Hernandez-Gonzalez, MD      . magnesium hydroxide (MILK OF MAGNESIA) suspension 30 mL  30 mL Oral Daily PRN Jimmy FootmanAndrea Hernandez-Gonzalez, MD      . metFORMIN (GLUCOPHAGE)  tablet 1,000 mg  1,000 mg Oral BID WC Jimmy FootmanAndrea Hernandez-Gonzalez, MD   1,000 mg at 10/05/15 0749  . OLANZapine (ZYPREXA) tablet 20 mg  20 mg Oral BID WC Jimmy FootmanAndrea Hernandez-Gonzalez, MD   20 mg at 10/05/15 0750   Or  . OLANZapine (ZYPREXA) injection 10 mg  10 mg Intramuscular BID WC Jimmy FootmanAndrea Hernandez-Gonzalez, MD      . simvastatin (ZOCOR) tablet 20 mg  20 mg Oral q1800 Jimmy FootmanAndrea Hernandez-Gonzalez, MD   20 mg at 10/04/15 1646  . tuberculin injection 5 Units  5 Units Intradermal Once Jimmy FootmanAndrea Hernandez-Gonzalez, MD   5 Units at 10/04/15 1236    Lab Results: No results found for this or any previous visit (from the past 48 hour(s)).  Blood Alcohol level:  Lab Results  Component Value Date   ETH <5 09/16/2015    Physical Findings: AIMS: Facial and Oral Movements Muscles of Facial Expression: None, normal, ,  ,  ,    CIWA:    COWS:     Musculoskeletal: Strength & Muscle Tone: within normal limits Gait & Station: normal Patient leans: N/A  Psychiatric Specialty Exam: Review of Systems  Unable to perform ROS Neurological: Negative.     There were no vitals taken for this visit.There is no height or weight on file to calculate BMI.  General Appearance: Disheveled  Eye Contact::  Minimal  Speech:  Normal Rate  Volume:  Decreased  Mood:  Anxious  Affect:  Constricted  Thought Process:  Disorganized  Orientation:  NA  Thought Content:  Disorganized thought processes answers are hyper religious  Suicidal Thoughts:  n/a  Homicidal Thoughts:  n/a  Memory:  NA  Judgement:  Impaired  Insight:  Lacking  Psychomotor Activity:  Normal  Concentration:  NA  Recall:  NA  Fund of Knowledge:NA  Language: Fair  Akathisia:  No  Handed:    AIMS (if indicated):     Assets:  Financial Resources/Insurance Housing Social Support  ADL's:  Intact  Cognition: WNL  Sleep:  Number of Hours: 3.25   Treatment Plan Summary:  Patient was mute today.  Continue to give all oral medications crushed  and mixed with apple sauce  Schizophrenia: Continue Zyprexa 20 mg by mouth twice.  Haldol will be increased to 10 mg by mouth twice a day a day.   Improving slowly, no plans for med changes today.    On non emergency forced medications since 3/21.  Will received zyprexa 10 mg IM if he refuses oral olanzapine.    Anxiety: Continue clonazepam 0.5 mg po bid  Agitation: Continue Ativan 2 mg by mouth or IM every 6 hours as needed  EPS: continue amantadine 100 mg po bid   Insomnia: Continue trazodone 100 mg by mouth daily at bedtime  Dyslipidemia: Continue Zocor 20 mg by mouth daily  Hypothyroidism: continue syntrhoid 125 mcg/day  Diabetes: Continue Glucophage at   by mouth twice a day  HTN: h/o HTN but unable to check VS.    Precautions every 15 minute checks  Diet regular  Hospitalization and status continue involuntary commitment  Disposition: Per Child psychotherapist he is not able to return to prior placement which was an ALF.   Still looking for placement.  ACT, DSS and social worker in our facility are all looking for placement for this patient.  Patient allowed the nurse to place a PPD yesterday  Discharge follow-up continue with [facility] services.  Labs: Unable to obtain any labs as patient refuses. Not able to draw lipid panel, hemoglobin A1c, or prolactin levels.  Social issues: Responsible [facility] management entity [facility]--- psychotherapeutic services ACT team in [place] Patient's guardian is [facility]  Records from [facility] from Nov 2015: 26 and [facility] due to refusing psychotropics. Per records at baseline he is hyper religious and response to internal stimuli. Patient was discharged on lisinopril 20 mg, aspirin 81 mg, clonazepam 0.5 mg twice a day, metformin at thousand milligrams twice a day, Synthroid 125 g daily, simvastatin 10 mg a day,  Risperdal orally disintegrating tablet 4 mg twice a day, olanzapine orally disintegrating tablet 20 mg twice a day  Jimmy Footman, MD 10/05/2015, 2:12 PM

## 2015-10-05 NOTE — Progress Notes (Signed)
D: Observed pt in room writing. Unable to determin pt orientation, as pt will not answer questions appropriately. Patient denies SI/HI/AVH. Pt affect is labile. Pt answers most questions with "God will help." Pt hard to understand verbally, but is religiously preoccupied. Pt did mention that he wanted a shasta cola for snack. Pt verbalized no complaints or needs. Pt was up much of the night in room, and would walk out regularly to check the time. Pt mostly isolative to room. Pt not interacting with peers or staff. A: Offered active listening and support. Provided therapeutic communication. Offered ativan prn for sleep/anxiety.  R: No issues with pt on the unit Pt refused prn medication, and had no scheduled medications. Will continue Q15 min. checks. Safety maintained.

## 2015-10-05 NOTE — Progress Notes (Signed)
Patient ID: [name], male   DOB: 12/27/1955, 60 y.o.   MRN: [id number]  [facility] MD Progress Note  10/05/2015 12:30 PM [name]  MRN:  [id number] Subjective:  Patient is seen in his room lying in his bed. Patient was somewhat more talkative today. He continues to perseverate about getting the money that his mother left in to pursue his education. Times the patient made some hyperreligious comments "God will the help".    Withdrawn to room, at times gets up and walks around the unit.  Compliant with meds. Oral intake is around 100%.   Per discussion in treatment team t medications to be given with breakfast and with dinner. The patient will be giving the medications with applesauce or with ice cream. Patient tends to like when the medications are mixed.   Grooming and hygiene are limited. Nursing staff states that he has what she is close and has been in the shower a couple times.  Body color is not as intense today  Compliance with unit regulations: Refuses vital signs and refuses labs.  Therapy: Withdrawn to his room. Patient is not participating in any type of therapy  Medication compliance: Patient maintains compliance  Oral intake: 100% yesterday.  Per nursing: Not Progressing.  Patient not oriented to person,place or time. Words not appropriate garbled speech  Principal Problem: Undifferentiated schizophrenia (HCC) Diagnosis:   Patient Active Problem List   Diagnosis Date Noted  . Dyslipidemia [E78.5] 09/25/2015  . Undifferentiated schizophrenia (HCC) [F20.3]   . Diabetes (HCC) [E11.9] 09/16/2015  . Hypothyroid [E03.9] 09/16/2015   Total Time spent with patient: 30 min    Past Medical History:  Past Medical History  Diagnosis Date  . Schizophrenia (HCC)   . Diabetes mellitus without complication (HCC)   . Thyroid disease   . Hyperlipemia   . Sickle cell trait (HCC)   . HTN (hypertension)   . TIA (transient ischemic attack) in 2009  . Seizure (HCC) While toxic on  Depakote  . Lithium toxicity   . Neutropenia (HCC)   . Hyperammonemia (HCC) While on Depakote   History reviewed. No pertinent past surgical history.  Social History:  History  Alcohol Use: Not on file     History  Drug Use Not on file    Social History   Social History  . Marital Status: Single    Spouse Name: N/A  . Number of Children: N/A  . Years of Education: N/A   Social History Main Topics  . Smoking status: Unknown If Ever Smoked  . Smokeless tobacco: None  . Alcohol Use: None  . Drug Use: None  . Sexual Activity: Not Asked   Other Topics Concern  . None   Social History Narrative      Current Medications: Current Facility-Administered Medications  Medication Dose Route Frequency Provider Last Rate Last Dose  . acetaminophen (TYLENOL) tablet 650 mg  650 mg Oral Q6H PRN Gregory Footman, MD      . alum & mag hydroxide-simeth (MAALOX/MYLANTA) 200-200-20 MG/5ML suspension 30 mL  30 mL Oral Q4H PRN Gregory Footman, MD      . amantadine (SYMMETREL) capsule 100 mg  100 mg Oral BID WC Gregory Footman, MD   100 mg at 10/05/15 0750  . aspirin chewable tablet 81 mg  81 mg Oral Q breakfast Gregory Footman, MD   81 mg at 10/05/15 0750  . clonazePAM (KLONOPIN) tablet 0.5 mg  0.5 mg Oral BID WC Gregory Footman, MD   0.5  mg at 10/05/15 0750  . haloperidol (HALDOL) tablet 10 mg  10 mg Oral BID Gregory Footman, MD      . levothyroxine (SYNTHROID, LEVOTHROID) tablet 125 mcg  125 mcg Oral QAC breakfast Gregory Footman, MD   125 mcg at 10/05/15 [phone]  . LORazepam (ATIVAN) tablet 2 mg  2 mg Oral Q6H PRN Gregory Footman, MD   2 mg at 09/24/15 1527   Or  . LORazepam (ATIVAN) injection 2 mg  2 mg Intramuscular Q6H PRN Gregory Footman, MD      . magnesium hydroxide (MILK OF MAGNESIA) suspension 30 mL  30 mL Oral Daily PRN Gregory Footman, MD      . metFORMIN (GLUCOPHAGE) tablet 1,000 mg   1,000 mg Oral BID WC Gregory Footman, MD   1,000 mg at 10/05/15 0749  . OLANZapine (ZYPREXA) tablet 20 mg  20 mg Oral BID WC Gregory Footman, MD   20 mg at 10/05/15 0750   Or  . OLANZapine (ZYPREXA) injection 10 mg  10 mg Intramuscular BID WC Gregory Footman, MD      . simvastatin (ZOCOR) tablet 20 mg  20 mg Oral q1800 Gregory Footman, MD   20 mg at 10/04/15 1646  . tuberculin injection 5 Units  5 Units Intradermal Once Gregory Footman, MD   5 Units at 10/04/15 1236    Lab Results: No results found for this or any previous visit (from the past 48 hour(s)).  Blood Alcohol level:  Lab Results  Component Value Date   ETH <5 09/16/2015    Physical Findings: AIMS: Facial and Oral Movements Muscles of Facial Expression: None, normal, ,  ,  ,    CIWA:    COWS:     Musculoskeletal: Strength & Muscle Tone: within normal limits Gait & Station: normal Patient leans: N/A  Psychiatric Specialty Exam: Review of Systems  Unable to perform ROS Neurological: Negative.     There were no vitals taken for this visit.There is no height or weight on file to calculate BMI.  General Appearance: Disheveled  Eye Contact::  Minimal  Speech:  Normal Rate  Volume:  Decreased  Mood:  Anxious  Affect:  Constricted  Thought Process:  Disorganized  Orientation:  NA  Thought Content:  Disorganized thought processes answers are hyper religious  Suicidal Thoughts:  n/a  Homicidal Thoughts:  n/a  Memory:  NA  Judgement:  Impaired  Insight:  Lacking  Psychomotor Activity:  Normal  Concentration:  NA  Recall:  NA  Fund of Knowledge:NA  Language: Fair  Akathisia:  No  Handed:    AIMS (if indicated):     Assets:  Financial Resources/Insurance Housing Social Support  ADL's:  Intact  Cognition: WNL  Sleep:  Number of Hours: 3.25   Treatment Plan Summary:  Patient was mute today.  Continue to give all oral medications crushed and mixed with  apple sauce  Schizophrenia: Continue Zyprexa 20 mg by mouth twice a day along with Haldol at 5 mg po q am and 10 mg po qhs.   Improving slowly, no plans for med changes today.    On non emergency forced medications since 3/21.  Will received zyprexa 10 mg IM if he refuses oral olanzapine.    Anxiety: Continue clonazepam 0.5 mg po bid  Agitation: Continue Ativan 2 mg by mouth or IM every 6 hours as needed  EPS: continue amantadine 100 mg po bid   Insomnia: Continue trazodone 100 mg by mouth daily at bedtime  Dyslipidemia: Continue Zocor 20 mg by mouth daily  Hypothyroidism: continue syntrhoid 125 mcg/day  Diabetes: Continue Glucophage at 1000mg   by mouth twice a day  HTN: h/o HTN but unable to check VS.    Precautions every 15 minute checks  Diet regular  Hospitalization and status continue involuntary commitment  Disposition: Per Child psychotherapistsocial worker he is not able to return to prior placement which was an ALF. [facility] has beds available.  Still looking for placement  Discharge follow-up continue with [facility] ACT services.  Labs: Unable to obtain any labs as patient refuses. Not able to draw lipid panel, hemoglobin A1c, or prolactin levels.  Social issues: Responsible [facility] management entity [facility]--- psychotherapeutic services ACT [place] Patient's guardian is [facility]--Kay Flack  [facility] from [facility]: 26 and state psychiatric hospital admissions due to refusing psychotropics. Per records at baseline he is hyper religious and response to internal stimuli. Patient was discharged on lisinopril 20 mg, aspirin 81 mg, clonazepam 0.5 mg twice a day, metformin at thousand milligrams twice a day, Synthroid 125 g daily, simvastatin 10 mg a day, Risperdal orally disintegrating tablet 4 mg twice a day, olanzapine orally  disintegrating tablet 20 mg twice a day  Gregory FootmanHernandez-Gonzalez,  Bayan Hedstrom, MD 10/05/2015, 12:30 PM

## 2015-10-05 NOTE — Tx Team (Signed)
Interdisciplinary Treatment Plan Update (Adult)        Date: 10/05/2015   Time Reviewed: 9:30 AM   Progress in Treatment: Improving  Attending groups: Intermittently Participating in groups: Intermittently Taking medication as prescribed: Yes  Tolerating medication: Yes  Family/Significant other contact made: Yes, CSW has spoken to the pt's group home and the pt's DSS social worker Marga Melnick Patient understands diagnosis: Yes  Discussing patient identified problems/goals with staff: Yes  Medical problems stabilized or resolved: Yes  Denies suicidal/homicidal ideation: Yes  Issues/concerns per patient self-inventory: Yes  Other:   New problem(s) identified: N/A   Discharge Plan or Barriers: Pt will be admitted into [facility] ot an assisted living facility for long-term residential treatment, therapy and medication managment   Reason for Continuation of Hospitalization:   Depression   Anxiety   Medication Stabilization   Comments: N/A   Estimated length of stay: 3-5 days             Pt is a 27 year rold male arrived to our ER on 3/11 and was admitted due to concerns of his group home for his mental and physical health.  Pt lives in [place].  [name] is a 6'6" tall, 230 lbs 60 y.o., African American male, who presents to the ER due to his [place] having concerns about his physical and mental health. Majority of the information gathered for this assessment was from patient Medical chart and Group Home Staff.  Patient has a history of stopping his medications and being religious preoccupied.  According to the Group Home, 3 or 4 days prior to coming here the patient show more severe psychotic symptoms. He only has been eating once a day and has been refusing medications. Group Home staff states, they are unsure how long he has gone without his medications. They found some of his medications in his room, when they were cleaning it. Patient usually take his meds in front of staff  and is also required to check his mouth. However, he has learned how to hide the pills in his mouth, so staff can't see them. When he goes to his room, he hides them in his closet. Patient goes in his closet and sit there for several hours "mumbling to his self." In the past, patient was going to his closet to prayer, "because that is what Jesus said in the scriptures."  Patient hygiene has decrease. Patient has refused to bathe and has had on the same clothes for several days. Patient refused to take them off and allow Group Home staff wash them. Patient is currently disheveled and have a strong odor coming from his person.   Upon arrival to the ER it took several staff members to restrain him to get lab work. They were unsuccessful with having him change his clothing.  His labs revealed hypernatremia his sodium was 151. He was in acute renal failure.  Creatinine was 1.52 and his GFR was 56 and BUN 46.  Test results are likely due to dehydration and poor oral intake.  Due to his high level of agitation patient was placed on the waiting list from [facility] where he has been hospitalized a multitude of times in the past. The patient was in our emergency department awaiting for bed there from 3/11 to 3/19.  File decision was made to hospitalize him yesterday after having several days of no aggression, no agitation. The patient per nursing in the emergency room, has been compliant with all  his oral medications and has been eating and drinking with no difficulties. Patient has been refusing daily vitals.    Laboratory work was repeated on March 14. Showing resolution of hypernatremia and acute renal insufficiency.  Patient is currently living at "[facility][phone])." He moved in 04/2015, from [facility] Group Home staff was unsure from what hospital he discharged from.  [place], is his Guardian Marga [phone]).   Patient currently receives Outpatient Treatment with [facility] [phone]), with their ACT Team.  Per Dr's notes: I saw this patient today in 2 days prior to this admission while he was in the emergency room. His answers to my questions are irrelevant. Most of the statements he made our about God and God will. He is unable to complete a review of systems form psychiatric and nonpsychiatric symptoms.  Substance abuse history: No known history of substance abuse.  Patient and CSW reviewed pt's identified goals and treatment plan. Pt verbalized understanding and agreed to treatment plan.        Review of initial/current patient goals per problem list:  1. Goal(s): Patient will participate in aftercare plan   Met: Yes  Target date: 3-5 days post admission date   As evidenced by: Patient will participate within aftercare plan AEB aftercare provider and housing plan at discharge being identified.   3/21: CSW still assessing for appropriate contacts  3/23: Pt will be admitted into [facility] upon discharge for long-term residential treatment, therapy and medication managment     2. Goal (s): Patient will exhibit decreased depressive symptoms and suicidal ideations.   Met: No  Target date: 3-5 days post admission date   As evidenced by: Patient will utilize self-rating of depression at 3 or below and demonstrate decreased signs of depression or be deemed stable for discharge by MD.   3/21: Goal progressing.  3/23: Goal progressing.  3/30: Goal progressing.    3. Goal(s): Patient will demonstrate decreased signs and symptoms of anxiety.   Met: No  Target date: 3-5 days post admission date   As evidenced by: Patient will utilize self-rating of anxiety at 3 or below and demonstrated decreased signs of anxiety, or be deemed stable for discharge by MD   3/21: Goal progressing  3/23: Goal progressing.  3/30: Goal progressing.     4. Goal(s): Patient will demonstrate  decreased signs of psychosis  * Met: No * Target date: 3-5 days post admission date  * As evidenced by: Patient will demonstrate decreased frequency of AVH or return to baseline function   3/21: Goal progressing.  3/23: Goal progressing.  3/30: Goal progressing.     Attendees:  Patient:  Family:  Physician: Dr. Jerilee Hoh, MD     09/28/2015 9:30 AM  Nursing: Polly Cobia, RN      09/28/2015 9:30 AM  Clinical Social Worker: Marylou Flesher, [place]   09/28/2015 9:30 AM  Clinical Social Worker:     09/28/2015 9:30 AM  Clinical Social Worker:     09/28/2015 9:30 AM  Nursing: Carolynn Sayers, RN     09/28/2015 9:30 AM  Nursing:  Polly Cobia , RN     09/28/2015 9:30 AM

## 2015-10-05 NOTE — Plan of Care (Signed)
Problem: Alteration in thought process Goal: LTG-Patient behavior demonstrates decreased signs psychosis (Patient behavior demonstrates decreased signs of psychosis to the point the patient is safe to return home and continue treatment in an outpatient setting.)  Outcome: Not Progressing Pt heard talking to self. Pt would answer most questions with "God will help". Pt awake most of night, and refused prn sleep meds.

## 2015-10-05 NOTE — Progress Notes (Signed)
Patient with flat affect, cooperative behavior with meals. Takes meds po with applesauce at meal time. Increasingly visible on unit, sitting in group room, writing. Unwilling to participate with assessment questions, states " God will do it", mumbling. No reports of SI/HI/AVH at this time. Safety maintained.

## 2015-10-05 NOTE — Plan of Care (Signed)
Problem: Alteration in thought process Goal: STG-Patient is able to discuss thoughts with staff Outcome: Not Progressing Patient unwilling to participate in assessment, therapy or plan of care.

## 2015-10-05 NOTE — Progress Notes (Addendum)
Patient ID: [name], male   DOB: 1955-12-13, 60 y.o.   MRN: [id number] CSW was informed by the group [name] owner [name] at [name] on 10/04/15 that she would accept the pt and would be by to pick him up on 10/05/15 due to the pt's DSS agent [name] telling [name] reasons why the patient would be difficult to take care of.  The CSW was informed by the pt's legal guardian [name] of [place] DSS that after she spoke with [name] the group [name] and informed her of the pt's difficulties in taking meds without extreme convincing and bathing, as well as refusing medical examinations at times, [name] told [name] she could no longer accept the pt.  Thr CSW informed [facility] that [name] - [name] considers them to be "refusing" a patient they have already accepted.  CSW informed DSS agent [name] on 10/04/15 that the pt will be discharged on 10/05/15.  Assistant Director [name] witnessed the phone call informing the DSS agent that the pt was being discharged.   Addendum:  CSW spoke to pt's DSS agent [name] at 12:45pm and informed her that the pt was being discharged and reminded Miss [name] that the CSW had given her 24 hours notice on 10/04/15 that the pt would be discharged on 10/05/15.  Miss [name] denied this had occurred and the CSW informed her that the CSW's Assistant director [name] was present for the conversation and had witnessed that. Miss [name] then informed the CSw that [name] could not discharge the pt without the her permission and the CSW informed Miss [name] that this was not correct and asked her if she was refusing to pick the pt up.  Miss [name] said she would call her supervisor.   At approx 1:30pm the CSW received a call from Miss [name]'s supervisor [name] and [name] and was informed that he was on speaker phone.  Miss [name] and Miss Margo AyeHall then informed the CSW that they understood the pt was being discharged and stated that they believe that a discharge  at this time would be considered to be an unsafe discharge.  The CSW informed Miss [name] and Miss Margo AyeHall that the CSW was informed by DSS agent [name] that Miss [name] and her supervisor Miss [name] had misgivings about the pt being placed [facility], but that both had agreed that they would allow the pt to be placed.  CSW then informed Miss [name] and Miss Margo AyeHall that [name] had told the CSW that it was their duty as DSS agents to call and warn the group [name] about difficulties that the pt had had in the past with group homes due to the pt's history of not bathing, not allowing his bed clothes to be changed at times and refusing meds and medical examinations.  CSW informed Miss [name] and Miss Margo AyeHall that he had informed the group [name] about these same issues and the group [name] had agreed to take the pt, until the DSS agents had called the group [name].  [facility],per previous notes, had informed the CSW that what the DSS agent had told her had made her change her mind.    The CSW then asked Miss [name] and Miss Margo AyeHall what their expectations for what a proper group [name] is so that the CSW can try to meet all reasonable expectations in finding placement and was told by Miss [name] and Miss Margo AyeHall that the [name]  was not suitable and had informed the CSW of that.  The CSW reiterated that he was told by [name] that she and Miss [name] had misgivings about the [name], but had agreed the pt can be placed there pending the group's [name]'s acceptance of the pt and then the CSW was informed by [name] that the group [name] had made the decision not to take the pt, not that the DSS Agent [name] and Miss [name] had refused to allow him to be placed.  Miss [name] and Miss Margo Aye asked the CSW if he was refusing to continue to find placements for the pt and the CSW informed Miss [name] and Miss Margo Aye that is and would continue to  seek placement but that the CSW believed group homes should be counseled and educated on how to treat a patient's symptoms, rather than counseling and educating group homes on why they shouldn't take the pt.  Miss [name] and Miss Margo Aye then said that that had not been their purpose, and then asked the CSW to confirm [name] would not discharge the pt until the following day 10/06/15 so that the CSW and Miss [name] and Miss Margo Aye could seek admittance for the pt with [name]'s [name] by speaking to [place] the group [name] Production designer, theatre/television/film at the [name].  The CSW said he did not have the authority to decided discharge and that discharge is still planned as of this phone call that this was [name] decision.  Miss [name] and Miss Margo Aye asked to speak to [name] and were informed by the CSw that he would have [name] call them, since he did not have the correct number for [name].  [name] and the CSW then called Miss [name] and Miss Margo Aye and said she would not discharge the pt on 10/05/2015 so that Miss [name] and Miss Margo Aye could pursue placement for the pt at [name] or one of the other group homes owned by [facility] so the pt could be discharged on 10/06/15.  Miss [name] stated they believed there had been a misunderstanding and that they had not intended to make the group [name] afraid of allowing the pt to be placed.

## 2015-10-05 NOTE — Progress Notes (Signed)
Recreation Therapy Notes  Date: 03.30.17 Time: 3:00 pm Location: Craft Room  Group Topic: Leisure Education  Goal Area(s) Addresses:  Patient will identify activities for each letter of the alphabet. Patient will verbalize ability to integrate leisure into life post d/c. Patient will verbalize ability to use leisure as a Associate Professorcoping skill.  Behavioral Response: Inattentive, Left early  Intervention: Leisure Alphabet  Activity: Patients were given a leisure alphabet and were instructed to list a healthy leisure activity for each letter of the alphabet.  Education: LRT educated group on how they can integrate leisure in their schedule.  Education Outcome: Patient left before LRT educated group.  Clinical Observations/Feedback: Patient wrote his own words on the worksheet. Patient left group at approximately 3:14 pm with social work. Patient did not return to group.  Jacquelynn CreeGreene,Forestine Macho M, LRT/CTRS 10/05/2015 4:29 PM

## 2015-10-05 NOTE — [facility] Group Notes (Signed)
[facility] Group Notes:  (Nursing/MHT/Case Management/Adjunct)  Date:  10/05/2015  Time:  2:48 PM  Type of Therapy:  Psychoeducational Skills  Participation Level:  Did Not Attend  Twanna Hymanda C Marwin Primmer 10/05/2015, 2:48 PM

## 2015-10-05 NOTE — [facility] Group Notes (Signed)
[facility] LCSW Group Therapy   10/05/2015 11am   Type of Therapy: Group Therapy   Participation Level: None   Participation Quality: Attentive and Supportive   Affect: Appropriate   Cognitive: Alert and Oriented   Insight: Developing/Improving and Engaged   Engagement in Therapy: Developing/Improving and Engaged   Modes of Intervention: Clarification, Confrontation, Discussion, Education, Exploration, Limit-setting, Orientation, Problem-solving, Rapport Building, Dance movement psychotherapisteality Testing, Socialization and Support   Summary of Progress/Problems: The topic for group was balance in life. Today's group focused on defining balance in one's own words, identifying things that can knock one off balance, and exploring healthy ways to maintain balance in life. Group members were asked to provide an example of a time when they felt off balance, describe how they handled that situation, and process healthier ways to regain balance in the future. Group members were asked to share the most important tool for maintaining balance that they learned while at [facility] and how they plan to apply this method after discharge. Pt arrived a few minutes late to group.  Pt then left group approx. ten minutes after group started and did not return.  Pt was polite and cooperative with the CSW and other group members and focused and attentive to the topics discussed and the sharing of others while present.

## 2015-10-05 NOTE — Progress Notes (Signed)
Patient ID: [name], male   DOB: 02/27/1956, 60 y.o.   MRN: [id number] CSW made an attempt to complete a PSA but the pt's speech was disorganized and pt became confused when questioned by the CSW.  Pt then asked to return to art therapy group.

## 2015-10-06 NOTE — [facility] Group Notes (Signed)
[facility] LCSW Group Therapy   10/06/2015 1pm  Type of Therapy: Group Therapy   Participation Level: Did Not Attend. Patient invited to participate but declined.    Tanazia Achee F. Kambrey Hagger, MSW, LCSWA, LCAS   

## 2015-10-06 NOTE — Progress Notes (Addendum)
Patient ID: [name], male   DOB: January 05, 1956, 60 y.o.   MRN: [id number]  [facility] MD Progress Note  10/06/2015 12:44 PM [name]  MRN:  [id number] Subjective:  Patient is minimally engaged in the assessment. I'm unable to complete a review of psychiatric symptoms. Per observations from staff patient is doing well, he has been going to the day room for meals. Yesterday he was seen outside in the courtyard along with other patients. Nursing staff reports that the patient has been showering however he continues to have a very strong body odor.  Compliant with meds. Oral intake is around 100%. Patient is more cooperative with Child psychotherapist. Patient is okay with going to a group home. I spoke t with DSS on 3/30, they tell me they're actively looking for a appropriate placement. Spoke with NP from [facility] and we discussed plan of care. She feels pt is improving.  She said she had never seen him out of bed before.  Per discussion in treatment team t medications to be given with breakfast and with dinner. The patient will be giving the medications with applesauce or with ice cream. Patient tends to like when the medications are mixed.   Grooming and hygiene are limited.   Compliance with unit regulations: Refuses vital signs and refuses labs.  Therapy: Withdrawn to his room. Patient is not participating in any type of therapy  Medication compliance: Patient maintains compliance  Oral intake: 100% yesterday.  Per nursing: Patient is alert and oriented x 2-3. Patient will not participate in treatment plan, in addition he wouldn't respond appropriately to question nursing staff asked him. Patient was noted to often pace around unit, not interacting with peers or staff. Patient appears disheveled with a body odor. Patient thoughts are disorganized and speech sounds like mumbling. Writer is unable to assess if he is suicidal or homocidal but noted to be responding to internal stimuli. 15 minutes checks  maintained, will continue to monitor.   Principal Problem: Undifferentiated schizophrenia (HCC) Diagnosis:   Patient Active Problem List   Diagnosis Date Noted  . Dyslipidemia [E78.5] 09/25/2015  . Undifferentiated schizophrenia (HCC) [F20.3]   . Diabetes (HCC) [E11.9] 09/16/2015  . Hypothyroid [E03.9] 09/16/2015   Total Time spent with patient: 30 min    Past Medical History:  Past Medical History  Diagnosis Date  . Schizophrenia (HCC)   . Diabetes mellitus without complication (HCC)   . Thyroid disease   . Hyperlipemia   . Sickle cell trait (HCC)   . HTN (hypertension)   . TIA (transient ischemic attack) in 2009  . Seizure (HCC) While toxic on Depakote  . Lithium toxicity   . Neutropenia (HCC)   . Hyperammonemia (HCC) While on Depakote   History reviewed. No pertinent past surgical history.  Social History:  History  Alcohol Use: Not on file     History  Drug Use Not on file    Social History   Social History  . Marital Status: Single    Spouse Name: N/A  . Number of Children: N/A  . Years of Education: N/A   Social History Main Topics  . Smoking status: Unknown If Ever Smoked  . Smokeless tobacco: None  . Alcohol Use: None  . Drug Use: None  . Sexual Activity: Not Asked   Other Topics Concern  . None   Social History Narrative      Current Medications: Current Facility-Administered Medications  Medication Dose Route Frequency Provider Last Rate Last Dose  .  acetaminophen (TYLENOL) tablet 650 mg  650 mg Oral Q6H PRN Jimmy FootmanAndrea Hernandez-Gonzalez, MD      . alum & mag hydroxide-simeth (MAALOX/MYLANTA) 200-200-20 MG/5ML suspension 30 mL  30 mL Oral Q4H PRN Jimmy FootmanAndrea Hernandez-Gonzalez, MD      . amantadine (SYMMETREL) capsule 100 mg  100 mg Oral BID WC Jimmy FootmanAndrea Hernandez-Gonzalez, MD   100 mg at 10/06/15 0904  . aspirin chewable tablet 81 mg  81 mg Oral Q breakfast Jimmy FootmanAndrea Hernandez-Gonzalez, MD   81 mg at 10/06/15 0905  . clonazePAM (KLONOPIN) tablet 0.5 mg   0.5 mg Oral BID WC Jimmy FootmanAndrea Hernandez-Gonzalez, MD   0.5 mg at 10/06/15 0905  . haloperidol (HALDOL) tablet 10 mg  10 mg Oral BID Jimmy FootmanAndrea Hernandez-Gonzalez, MD   10 mg at 10/06/15 0905  . levothyroxine (SYNTHROID, LEVOTHROID) tablet 125 mcg  125 mcg Oral QAC breakfast Jimmy FootmanAndrea Hernandez-Gonzalez, MD   125 mcg at 10/06/15 0905  . LORazepam (ATIVAN) tablet 2 mg  2 mg Oral Q6H PRN Jimmy FootmanAndrea Hernandez-Gonzalez, MD   2 mg at 09/24/15 1527   Or  . LORazepam (ATIVAN) injection 2 mg  2 mg Intramuscular Q6H PRN Jimmy FootmanAndrea Hernandez-Gonzalez, MD      . magnesium hydroxide (MILK OF MAGNESIA) suspension 30 mL  30 mL Oral Daily PRN Jimmy FootmanAndrea Hernandez-Gonzalez, MD      . metFORMIN (GLUCOPHAGE) tablet 1,000 mg  1,000 mg Oral BID WC Jimmy FootmanAndrea Hernandez-Gonzalez, MD   1,000 mg at 10/06/15 0905  . OLANZapine (ZYPREXA) tablet 20 mg  20 mg Oral BID WC Jimmy FootmanAndrea Hernandez-Gonzalez, MD   20 mg at 10/06/15 0905   Or  . OLANZapine (ZYPREXA) injection 10 mg  10 mg Intramuscular BID WC Jimmy FootmanAndrea Hernandez-Gonzalez, MD      . simvastatin (ZOCOR) tablet 20 mg  20 mg Oral q1800 Jimmy FootmanAndrea Hernandez-Gonzalez, MD   20 mg at 10/05/15 1601    Lab Results: No results found for this or any previous visit (from the past 48 hour(s)).  Blood Alcohol level:  Lab Results  Component Value Date   ETH <5 09/16/2015    Physical Findings: AIMS: Facial and Oral Movements Muscles of Facial Expression: None, normal, ,  ,  ,    CIWA:    COWS:     Musculoskeletal: Strength & Muscle Tone: within normal limits Gait & Station: normal Patient leans: N/A  Psychiatric Specialty Exam: Review of Systems  Unable to perform ROS Neurological: Negative.     There were no vitals taken for this visit.There is no height or weight on file to calculate BMI.  General Appearance: Disheveled  Eye Contact::  Minimal  Speech:  Normal Rate  Volume:  Decreased  Mood:  Anxious  Affect:  Constricted  Thought Process:  Disorganized  Orientation:  NA  Thought Content:   Disorganized thought processes answers are hyper religious  Suicidal Thoughts:  n/a  Homicidal Thoughts:  n/a  Memory:  NA  Judgement:  Impaired  Insight:  Lacking  Psychomotor Activity:  Normal  Concentration:  NA  Recall:  NA  Fund of Knowledge:NA  Language: Fair  Akathisia:  No  Handed:    AIMS (if indicated):     Assets:  Financial Resources/Insurance Housing Social Support  ADL's:  Intact  Cognition: WNL  Sleep:  Number of Hours: 5   Treatment Plan Summary:  Patient was mute today.  Continue to give all oral medications crushed and mixed with apple sauce  Schizophrenia: Continue Zyprexa 20 mg by mouth twice.  Continue Haldol  10 mg by mouth twice a day a day.   Improving slowly, no plans for med changes today.    On non emergency forced medications since 3/21.  Will received zyprexa 10 mg IM if he refuses oral olanzapine.    Anxiety: Continue clonazepam 0.5 mg po bid  Agitation: Continue Ativan 2 mg by mouth or IM every 6 hours as needed  EPS: continue amantadine 100 mg po bid   Insomnia: Continue trazodone 100 mg by mouth daily at bedtime  Dyslipidemia: Continue Zocor 20 mg by mouth daily  Hypothyroidism: continue syntrhoid 125 mcg/day  Diabetes: Continue Glucophage at   by mouth twice a day  HTN: h/o HTN but unable to check VS.    Precautions every 15 minute checks  Diet regular  Hospitalization and status continue involuntary commitment  Disposition: Per Child psychotherapist he is not able to return to prior placement which was an ALF.   Still looking for placement.  ACT, DSS and social worker in our facility are all looking for placement for this patient.  Patient allowed the nurse to place a PPD wednesday  Discharge follow-up continue with [facility] ACT services.  Labs: Unable to obtain any labs as patient refuses. Not able to draw lipid panel, hemoglobin A1c, or prolactin levels.  Social issues: Responsible local management entity [facility] Outpatient provider--- psychotherapeutic services ACT team in [place] Patient's guardian is [facility]  Records from [facility] from Nov 2015: 26 and state psychiatric hospital admissions due to refusing psychotropics. Per records at baseline he is hyper religious and response to internal stimuli. Patient was discharged on lisinopril 20 mg, aspirin 81 mg, clonazepam 0.5 mg twice a day, metformin at thousand milligrams twice a day, Synthroid 125 g daily, simvastatin 10 mg a day, Risperdal orally disintegrating tablet 4 mg twice a day, olanzapine orally disintegrating tablet 20 mg twice a day  I certify that the services received since the previous certification/recertification were and continue to be medically necessary as the treatment provided can be reasonably expected to improve the patient's condition; the medical record documents that the services furnished were intensive treatment services or their equivalent services, and this patient continues to need, on a daily basis, active treatment furnished directly by or requiring the supervision of inpatient psychiatric personnel.    Jimmy Footman, MD 10/06/2015, 12:44 PM

## 2015-10-06 NOTE — Plan of Care (Signed)
Problem: Alteration in thought process Goal: LTG-Patient behavior demonstrates decreased signs psychosis (Patient behavior demonstrates decreased signs of psychosis to the point the patient is safe to return home and continue treatment in an outpatient setting.) Outcome: Not Progressing Patient responding to internal stimuli.      

## 2015-10-06 NOTE — [facility] Suicide Risk Assessment (Signed)
[facility] INPATIENT:  Family/Significant Other Suicide Prevention Education  Suicide Prevention Education:  Patient Refusal for Family/Significant Other Suicide Prevention Education: The patient [name] has refused to provide written consent for family/significant other to be provided Family/Significant Other Suicide Prevention Education during admission and/or prior to discharge.  Physician notified. Pt refused SPE from the CSW.  Dorothe PeaJonathan F Derrik Mceachern 10/06/2015, 3:13 PM

## 2015-10-06 NOTE — [facility] Counselor (Signed)
Adult Comprehensive Assessment  Patient ID: [name], male   DOB: Nov 04, 1955, 60 y.o.   MRN: [id number]  Information Source: Information source: Patient  Current Stressors:  Educational / Learning stressors: Pt desires to return to college Employment / Job issues: N/A Family Relationships: Pt has one sister in [place] according to his legal guardian Surveyor, quantityinancial / Lack of resources (include bankruptcy): Pt desires his whole and complete disability check rather then what is left over from room and board Housing / Lack of housing: Pt wants a new group home Physical health (include injuries & life threatening diseases): Diabetes Social relationships: N/A Substance abuse: N/A Bereavement / Loss: N/A  Living/Environment/Situation:  Living Arrangements: Group Home Living conditions (as described by patient or guardian): Pt lives in and has lived in multiple group homes and assisted living facilities How long has patient lived in current situation?: Years What is atmosphere in current home: Temporary  Family History:  Marital status: Single Does patient have children?: No  Childhood History:  By whom was/is the patient raised?:  (Unknown) Additional childhood history information: Pt does not know Description of patient's relationship with caregiver when they were a child: Unknown Patient's description of current relationship with people who raised him/her: Pt does not know  Education:  Highest grade of school patient has completed: Two years of college Learning disability?:  (Unknown)  Employment/Work Situation:   Employment situation: On disability Why is patient on disability: Schizophrenia How long has patient been on disability: Unknown What is the longest time patient has a held a job?: N/A Where was the patient employed at that time?: N/a Has patient ever been in the [employer] and Companymilitary?: No  Financial Resources:   Surveyor, quantityinancial resources: [employer] Pet[employer]ceives SSI, Medicaid, Medicare Does  patient have a Lawyerrepresentative payee or guardian?: Yes Name of representative payee or guardian: Careers information officerKay Flack of [place] DSS  Alcohol/Substance Abuse:   What has been your use of drugs/alcohol within the last 12 months?: Unknown If attempted suicide, did drugs/alcohol play a role in this?: No Alcohol/Substance Abuse Treatment Hx: Denies past history Has alcohol/substance abuse ever caused legal problems?:  (Unknown)  Social Support System:   Patient's Community Support System: Good Describe Community Support System: Pt's ACT team ([facility]) Type of faith/religion: Presbyterian How does patient's faith help to cope with current illness?: Prayer and Faith  Leisure/Recreation:   Leisure and Hobbies: Playing basketball  Strengths/Needs:   What things does the patient do well?: Plays basketball wall In what areas does patient struggle / problems for patient: Unknown  Discharge Plan:   Does patient have access to transportation?: Yes Will patient be returning to same living situation after discharge?: No Plan for living situation after discharge: Pt will discharge to a group home Currently receiving community mental health services: Yes (From Whom) (Psychotherapeutic Services) Does patient have financial barriers related to discharge medications?: No  Summary/Recommendations:   Summary and Recommendations (to be completed by the evaluator): Patient presented to after being transported by his group home and was admitted for concerns over his physical and mental health.  Pt reports his stressors are he wants to return to college and he wants to play basketball once more.   Pt's primary trigger for admission is he does not receive his complete disability check so that he cannot return to college.   Pt now denies SI/HI/AVH.  [place]  Pt lists supports in the community as his group home.  Patient will benefit from crisis stabilization, medication evaluation, group  therapy, and  psycho education in addition to case management for discharge planning. Patient and CSW reviewed pt's identified goals and treatment plan. Pt verbalized understanding and agreed to treatment plan.  At discharge it is recommended that patient remain compliant with established plan and continue treatment.  Dorothe Pea Dailyn Kempner. 10/06/2015

## 2015-10-06 NOTE — Progress Notes (Signed)
Patient is alert and oriented x 2-3. Patient will not participate in treatment plan, in addition he wouldn't respond appropriately to question nursing staff asked him. Patient was noted to often pace around unit, not interacting with peers or staff. Patient appears disheveled with a body odor. Patient thoughts are disorganized and speech sounds like mumbling. Writer is unable to assess if he is suicidal or homocidal but noted to be responding to internal stimuli. 15 minutes checks maintained, will continue to monitor.

## 2015-10-06 NOTE — Plan of Care (Signed)
Problem: Ineffective individual coping Goal: STG: Patient will remain free from self harm Outcome: Progressing Patient remains free from self harm currently     

## 2015-10-06 NOTE — Progress Notes (Signed)
Patient ID: Charlesetta IvoryFinley Cartelli, male   DOB: 1955-09-16, 60 y.o.   MRN: [id number] CSW spoke to the pt's legal guardian and [place] DSS agent Lazaro ArmsKay Flack and was informed by Lazaro ArmsKay Flack that [place] DSS did not feel that [facility] was a safe discharge for the pt, despite the CSW referring the pt and [facility] stating that they would be willing to visit the pt on 10/06/15 and pick up the pt on 07/09/15.  Lazaro ArmsKay Flack then informed the CSW that she spoke to another group home at ph: [phone] and informed Augustine RadarLamont Leath at [facility] of the pt's prior difficulties and behaviors at past group homes.  The CSW then informed Miss Janalyn ShyFlack, in the presence [facility]W [facility] Monday [facility].  The CSW then spoke to [place]ath at [facility] and he said he spoke to the legal guardian and had some misgivings due to past behaviors of the pt according to the legal guardian and that he would consider the placement but would have to think about it now.  CSW gave Karilyn CotaLeath CSW Candace Hyatt's number.

## 2015-10-06 NOTE — [facility] Group Notes (Signed)
[facility] LCSW Aftercare Discharge Planning Group Note  10/06/2015 9:30 AM  Participation Quality: Did Not Attend. Patient invited to participate but declined.   [name] F. Annlouise Gerety, MSW, LCSWA, LCAS   

## 2015-10-06 NOTE — Progress Notes (Signed)
D:  Patient is alert but questionably oriented on the unit this shift.  Patient did not attend groups today.  Patient is unable or unwilling to answer questions regarding suicidal ideation, homicidal ideation, auditory or visual hallucinations at the present time.  Patient is religiously preoccupied today, talking about taking the body and blood of Jesus when his medications were administered. A:  Scheduled medications are administered to patient as per MD orders.  Emotional support and encouragement are provided.  Patient is maintained on q.15 minute safety checks.  Patient is informed to notify staff with questions or concerns. R:  No adverse medication reactions are noted.  Patient is cooperative with medication administration today.  Patient is calm and somewhat cooperative on the unit at this time.  Patient interacts with others minimally on the unit this shift.   Patient remains safe at this time.

## 2015-10-06 NOTE — Progress Notes (Signed)
Recreation Therapy Notes  Date: 03.31.17 Time: 3:00 pm Location: Craft Room  Group Topic: Communication, Problem Solving, Teamwork  Goal Area(s) Addresses:  Patient will effectively work with peer towards shared goal. Patient will identify skills used to make activity successful. Patient will identify benefit of using group skills effectively post d/c.  Behavioral Response: Did not attend  Intervention: Pipe Cleaner Tower  Activity: Patients were given 15 pipe cleaners and were instructed to build a free standing tower out of all 15 pipe cleaners. After approximately 3-5 minutes, patients were instructed to put their dominant hand behind their back. After another 3-5 minutes, patients were instructed to stop talking to their group.  Education: LRT educated patients on healthy support systems.  Education Outcome: Patient did not attend group.   Clinical Observations/Feedback: Patient did not attend group.  Heydi Swango M, LRT/CTRS 10/06/2015 4:26 PM 

## 2015-10-06 NOTE — Plan of Care (Signed)
Problem: Alteration in thought process Goal: STG-Patient is able to follow short directions Outcome: Progressing Patient can follow short directions.

## 2015-10-06 NOTE — Progress Notes (Signed)
Patient ID: [name], male   DOB: 1956/02/11, 60 y.o.   MRN: [id number] CSW informed the pt's legal guardian [name] that two group homes are interested in the pt and gave the legal guardian the phone numbers and contact information.  The legal guardian [name] stated I thought we had agreed on the [name].  The CSW stated there is no guarantee that the group home will take the pt and [facility] is stating that they can see the pt on Friday and then pick him up on Saturday 10/06/15.

## 2015-10-07 NOTE — Progress Notes (Signed)
D: Pt denies SI/HI/AVH, .Patient is not participating in treatment plan. Patient's affect is blunted and hostile towards staff. A: Pt was offered support and encouragement. Pt was given scheduled medications. Pt was encouraged to attend groups. Q 15 minute checks were done for safety.  R:Pt did not attend group. Pt is not complaint with medication, and not receptive to treatment. Safety maintained on unit.

## 2015-10-07 NOTE — Plan of Care (Signed)
Problem: Alteration in thought process Goal: LTG-Patient behavior demonstrates decreased signs psychosis (Patient behavior demonstrates decreased signs of psychosis to the point the patient is safe to return home and continue treatment in an outpatient setting.)  Outcome: Not Progressing Patient demonstrate psychosis.      

## 2015-10-07 NOTE — [facility] Group Notes (Signed)
[facility] Group Notes:  (Nursing/MHT/Case Management/Adjunct)  Date:  10/07/2015  Time:  9:23 AM  Type of Therapy:  Community   Participation Level:  Did Not Attend  [name] [name] 10/07/2015, 9:23 AM

## 2015-10-07 NOTE — Plan of Care (Signed)
Problem: Alteration in thought process Goal: STG-Patient is able to discuss thoughts with staff Outcome: Not Progressing No interaction with staff

## 2015-10-07 NOTE — Progress Notes (Signed)
Patient ID: [name], male   DOB: 24-Nov-1955, 60 y.o.   MRN: [id number]  [facility] MD Progress Note  10/07/2015 6:26 PM [name]  MRN:  [id number] Subjective:    The patient actually did come into the interview room with this writer but thought processes were disorganized and the patient was not able to answer questions directly. He did approach this Clinical research associate several times in the hallway and asked questions about by profession. He has not verbalized any current suicidal thoughts. It is unclear to what degree the patient may be experiencing any psychotic symptoms as he does not answer any questions. He has not been aggressive or violent but does not interact with peers. The patient remains fairly isolative on the unit. He is disheveled. Speech is difficult to understand and thought processes are disorganized. At times, he does appear to be responding to internal stimuli. Appetite is good. The patient denies any somatic complaints. He has been resistant to take medications but after much encouragement will take oral medications.  Grooming and hygiene are limited.   Compliance with unit regulations: Refuses vital signs and refuses labs.      Principal Problem: Undifferentiated schizophrenia (HCC) Diagnosis:   Patient Active Problem List   Diagnosis Date Noted  . Dyslipidemia [E78.5] 09/25/2015  . Undifferentiated schizophrenia (HCC) [F20.3]   . Diabetes (HCC) [E11.9] 09/16/2015  . Hypothyroid [E03.9] 09/16/2015   Total Time spent with patient: 30 min    Past Medical History:  Past Medical History  Diagnosis Date  . Schizophrenia (HCC)   . Diabetes mellitus without complication (HCC)   . Thyroid disease   . Hyperlipemia   . Sickle cell trait (HCC)   . HTN (hypertension)   . TIA (transient ischemic attack) in 2009  . Seizure (HCC) While toxic on Depakote  . Lithium toxicity   . Neutropenia (HCC)   . Hyperammonemia (HCC) While on Depakote   History reviewed. No pertinent past  surgical history.  Social History:  History  Alcohol Use: Not on file     History  Drug Use Not on file    Social History   Social History  . Marital Status: Single    Spouse Name: N/A  . Number of Children: N/A  . Years of Education: N/A   Social History Main Topics  . Smoking status: Unknown If Ever Smoked  . Smokeless tobacco: None  . Alcohol Use: None  . Drug Use: None  . Sexual Activity: Not Asked   Other Topics Concern  . None   Social History Narrative      Current Medications: Current Facility-Administered Medications  Medication Dose Route Frequency Provider Last Rate Last Dose  . acetaminophen (TYLENOL) tablet 650 mg  650 mg Oral Q6H PRN Jimmy Footman, MD      . alum & mag hydroxide-simeth (MAALOX/MYLANTA) 200-200-20 MG/5ML suspension 30 mL  30 mL Oral Q4H PRN Jimmy Footman, MD      . amantadine (SYMMETREL) capsule 100 mg  100 mg Oral BID WC Jimmy Footman, MD   100 mg at 10/07/15 1632  . aspirin chewable tablet 81 mg  81 mg Oral Q breakfast Jimmy Footman, MD   81 mg at 10/07/15 0807  . clonazePAM (KLONOPIN) tablet 0.5 mg  0.5 mg Oral BID WC Jimmy Footman, MD   0.5 mg at 10/07/15 1631  . haloperidol (HALDOL) tablet 10 mg  10 mg Oral BID Jimmy Footman, MD   10 mg at 10/07/15 0807  . levothyroxine (SYNTHROID, LEVOTHROID)  tablet 125 mcg  125 mcg Oral QAC breakfast Jimmy Footman, MD   125 mcg at 10/07/15 0807  . LORazepam (ATIVAN) tablet 2 mg  2 mg Oral Q6H PRN Jimmy Footman, MD   2 mg at 09/24/15 1527   Or  . LORazepam (ATIVAN) injection 2 mg  2 mg Intramuscular Q6H PRN Jimmy Footman, MD      . magnesium hydroxide (MILK OF MAGNESIA) suspension 30 mL  30 mL Oral Daily PRN Jimmy Footman, MD      . metFORMIN (GLUCOPHAGE) tablet 1,000 mg  1,000 mg Oral BID WC Jimmy Footman, MD   1,000 mg at 10/07/15 1631  . OLANZapine (ZYPREXA) tablet 20  mg  20 mg Oral BID WC Jimmy Footman, MD   20 mg at 10/07/15 1631   Or  . OLANZapine (ZYPREXA) injection 10 mg  10 mg Intramuscular BID WC Jimmy Footman, MD      . simvastatin (ZOCOR) tablet 20 mg  20 mg Oral q1800 Jimmy Footman, MD   20 mg at 10/07/15 1631    Lab Results: No results found for this or any previous visit (from the past 48 hour(s)).  Blood Alcohol level:  Lab Results  Component Value Date   ETH <5 09/16/2015    Physical Findings: AIMS: Facial and Oral Movements Muscles of Facial Expression: None, normal, ,  ,  ,    CIWA:    COWS:     Musculoskeletal: Strength & Muscle Tone: within normal limits Gait & Station: normal Patient leans: N/A  Psychiatric Specialty Exam: Review of Systems  Unable to perform ROS: mental acuity  Constitutional: Negative for fever.  Neurological: Negative.     There were no vitals taken for this visit.There is no height or weight on file to calculate BMI.  General Appearance: Disheveled  Eye Contact::  Minimal  Speech:  Normal Rate  Volume:  Decreased  Mood:  Anxious  Affect:  Constricted  Thought Process:  Disorganized  Orientation:  NA  Thought Content:  Disorganized thought processes answers are hyper religious  Suicidal Thoughts:  n/a  Homicidal Thoughts:  n/a  Memory:  NA  Judgement:  Impaired  Insight:  Lacking  Psychomotor Activity:  Normal  Concentration:  NA  Recall:  NA  Fund of Knowledge:NA  Language: Fair  Akathisia:  No  Handed:    AIMS (if indicated):     Assets:  Financial Resources/Insurance Housing Social Support  ADL's:  Intact  Cognition: WNL  Sleep:  Number of Hours: 3   Treatment Plan Summary:  Patient was mute today.  Continue to give all oral medications crushed and mixed with apple sauce  Schizophrenia: Continue Zyprexa 20 mg by mouth BID.  Continue Haldol  10 mg by mouth BID   Improving slowly, no plans for med changes today. With encouragement he is  taking oral medications.    On non emergency forced medications since 3/21.  Will received zyprexa 10 mg IM if he refuses oral olanzapine.    Anxiety: Continue clonazepam 0.5 mg po bid  Agitation: Continue Ativan 2 mg by mouth or IM every 6 hours as needed  EPS: continue amantadine 100 mg po bid   Insomnia: Continue trazodone 100 mg by mouth daily at bedtime  Dyslipidemia: Continue Zocor 20 mg by mouth daily  Hypothyroidism: continue syntrhoid 125 mcg/day  Diabetes: Continue Glucophage at   by mouth twice a day  HTN: h/o HTN but unable to check VS.    Precautions every 15  minute checks  Diet regular  Hospitalization and status continue involuntary commitment  Disposition: Per Child psychotherapistsocial worker he is not able to return to prior placement which was an ALF.   Still looking for placement.  ACT, DSS and social worker in our facility are all looking for placement for this patient.  Patient allowed the nurse to place a PPD wednesday  Discharge follow-up continue with [facility] services.  Labs: Unable to obtain any labs as patient refuses. Not able to draw lipid panel, hemoglobin A1c, or prolactin levels.  Social issues: Responsible local management entity [facility] [place] Patient's [place] [facility]Kay Flack  [facility] 26 [facility] due to refusing psychotropics. Per records at baseline he is hyper religious and response to internal stimuli. Patient was discharged on lisinopril 20 mg, aspirin 81 mg, clonazepam 0.5 mg twice a day, metformin at thousand milligrams twice a day, Synthroid 125 g daily, simvastatin 10 mg a day, Risperdal orally disintegrating tablet 4 mg twice a day, olanzapine orally disintegrating tablet 20 mg twice a day  Levora AngelKAPUR,Sukhraj Esquivias KAMAL, MD 10/07/2015, 6:26 PM

## 2015-10-07 NOTE — [facility] Group Notes (Signed)
[facility] LCSW Group Therapy  10/07/2015 3:10 PM  Type of Therapy:  Group Therapy  Participation Level:  Did Not Attend  Modes of Intervention:  Discussion, Education, Socialization and Support  Summary of Progress/Problems: Pt will identify unhealthy thoughts and how they impact their emotions and behavior. Pt will be encouraged to discuss these thoughts, emotions and behaviors with the group.   Pachia Strum L Enma Maeda MSW, LCSWA  10/07/2015, 3:10 PM  

## 2015-10-08 NOTE — Plan of Care (Signed)
Problem: Alteration in thought process Goal: STG-Patient is able to discuss thoughts with staff Outcome: Not Progressing Pt maintains minimal interaction with staff.  Problem: Ineffective individual coping Goal: STG: Patient will remain free from self harm Outcome: Progressing Pt remains free from harm.

## 2015-10-08 NOTE — Progress Notes (Signed)
Patient was reported by another patient to have entered her room last night, several times, then mumbled to himself and walked back out.  Patient was witnessed walking into another patient's room this afternoon touching the other patient's belongings.  Patient was informed that he is not allowed to go into any other patients' rooms.  Patient did not respond to this instruction.  Patient then walked down the blue hall and pushed on the door at the end of the hall.  Patient was instructed to come off of the blue hall and return to his own hall.  Patient did not respond to this instruction.  The patients on the green hall were instructed to keep their doors shut and the doors at the back of the green hall were shut and locked once patient was back in his room.

## 2015-10-08 NOTE — [facility] Group Notes (Signed)
[facility] Group Notes:  (Nursing/MHT/Case Management/Adjunct)  Date:  10/08/2015  Time:  5:55 PM  Type of Therapy:  Psychoeducational Skills  Participation Level:  Did Not Attend  Lynelle SmokeCara [facility] 10/08/2015, 5:55 PM

## 2015-10-08 NOTE — Plan of Care (Signed)
Problem: Alteration in thought process Goal: STG-Patient is able to discuss thoughts with staff Outcome: Not Progressing Patient remains religiously preoccupied currently

## 2015-10-08 NOTE — Progress Notes (Signed)
D:  Patient is alert and questionably oriented on the unit this shift.  Patient did not attend groups today.  Patient is not able to confirm or deny suicidal ideation, homicidal ideation, auditory or visual hallucinations at the present time.  When asked questions, patient answers "the Shaune PollackLord will handle it."  A:  Scheduled medications are administered to patient as per MD orders.  Emotional support and encouragement are provided.  Patient is maintained on q.15 minute safety checks.  Patient is informed to notify staff with questions or concerns. R:  No adverse medication reactions are noted.  Patient is cooperative with medication administration today.  Patient is calm and cooperative on the unit at this time.  Patient does not interact with others on the unit this shift.  Patient remains safe at this time.

## 2015-10-08 NOTE — Progress Notes (Signed)
Patient ID: [name], male   DOB: 21-Oct-1955, 60 y.o.   MRN: [id number]  [facility]  10/08/2015 3:13 PM [name]  MRN:  [id number]   Subjective:    The patient continues to be isolative and does not interact with staff or peers although he is visible in the day room and sometimes pacing on the unit. When approached, the patient would mumble "Jesus Christ" to most questions asked and other times would the word "possibly" when asked questions. He did however report that he watched basketball game last night between [facility] and [place] and was aware that [employer] won. He also talked about [facility] [employer] [employer] [place]. He was unable to talk about where he lived in the past and where he currently lives. He has not verbalized any suicidal thoughts. He does appear to be responding to internal stimuli but would not answer questions with regards to auditory or visual hallucinations. He remains guarded and suspicious. He continues to need a lot of encouragement to be compliant with medications. He has been taking oral medications  Grooming and hygiene are limited.   Compliance with unit regulations: Refuses vital signs and refuses labs.      Principal Problem: Undifferentiated schizophrenia (HCC) Diagnosis:   Patient Active Problem List   Diagnosis Date Noted  . Dyslipidemia [E78.5] 09/25/2015  . Undifferentiated schizophrenia (HCC) [F20.3]   . Diabetes (HCC) [E11.9] 09/16/2015  . Hypothyroid [E03.9] 09/16/2015   Total Time spent with patient: 30 min    Past Medical History:  Past Medical History  Diagnosis Date  . Schizophrenia (HCC)   . Diabetes mellitus without complication (HCC)   . Thyroid disease   . Hyperlipemia   . Sickle cell trait (HCC)   . HTN (hypertension)   . TIA (transient ischemic attack) in 2009  . Seizure (HCC) While toxic on Depakote  . Lithium toxicity   . Neutropenia (HCC)   . Hyperammonemia (HCC) While on Depakote   History reviewed. No  pertinent past surgical history.  Social History:  History  Alcohol Use: Not on file     History  Drug Use Not on file    Social History   Social History  . Marital Status: Single    Spouse Name: N/A  . Number of Children: N/A  . Years of Education: N/A   Social History Main Topics  . Smoking status: Unknown If Ever Smoked  . Smokeless tobacco: None  . Alcohol Use: None  . Drug Use: None  . Sexual Activity: Not Asked   Other Topics Concern  . None   Social History Narrative      Current Medications: Current Facility-Administered Medications  Medication Dose Route Frequency Provider Last Rate Last Dose  . acetaminophen (TYLENOL) tablet 650 mg  650 mg Oral Q6H PRN Jimmy FootmanAndrea Hernandez-Gonzalez, MD      . alum & mag hydroxide-simeth (MAALOX/MYLANTA) 200-200-20 MG/5ML suspension 30 mL  30 mL Oral Q4H PRN Jimmy FootmanAndrea Hernandez-Gonzalez, MD      . amantadine (SYMMETREL) capsule 100 mg  100 mg Oral BID WC Jimmy FootmanAndrea Hernandez-Gonzalez, MD   100 mg at 10/08/15 0758  . aspirin chewable tablet 81 mg  81 mg Oral Q breakfast Jimmy FootmanAndrea Hernandez-Gonzalez, MD   81 mg at 10/08/15 0758  . clonazePAM (KLONOPIN) tablet 0.5 mg  0.5 mg Oral BID WC Jimmy FootmanAndrea Hernandez-Gonzalez, MD   0.5 mg at 10/08/15 0758  . haloperidol (HALDOL) tablet 10 mg  10 mg Oral BID Jimmy FootmanAndrea Hernandez-Gonzalez, MD  10 mg at 10/08/15 0758  . levothyroxine (SYNTHROID, LEVOTHROID) tablet 125 mcg  125 mcg Oral QAC breakfast Jimmy Footman, MD   125 mcg at 10/08/15 0758  . LORazepam (ATIVAN) tablet 2 mg  2 mg Oral Q6H PRN Jimmy Footman, MD   2 mg at 09/24/15 1527   Or  . LORazepam (ATIVAN) injection 2 mg  2 mg Intramuscular Q6H PRN Jimmy Footman, MD      . magnesium hydroxide (MILK OF MAGNESIA) suspension 30 mL  30 mL Oral Daily PRN Jimmy Footman, MD      . metFORMIN (GLUCOPHAGE) tablet 1,000 mg  1,000 mg Oral BID WC Jimmy Footman, MD   1,000 mg at 10/08/15 0758  . OLANZapine  (ZYPREXA) tablet 20 mg  20 mg Oral BID WC Jimmy Footman, MD   20 mg at 10/08/15 0757   Or  . OLANZapine (ZYPREXA) injection 10 mg  10 mg Intramuscular BID WC Jimmy Footman, MD      . simvastatin (ZOCOR) tablet 20 mg  20 mg Oral q1800 Jimmy Footman, MD   20 mg at 10/07/15 1631    Lab Results: No results found for this or any previous visit (from the past 48 hour(s)).  Blood Alcohol level:  Lab Results  Component Value Date   ETH <5 09/16/2015    Physical Findings: AIMS: Facial and Oral Movements Muscles of Facial Expression: None, normal, ,  ,  ,        Musculoskeletal: Strength & Muscle Tone: within normal limits Gait & Station: normal Patient leans: N/A  Psychiatric Specialty Exam: Review of Systems  Unable to perform ROS: mental acuity  Neurological: Negative.     There were no vitals taken for this visit.There is no height or weight on file to calculate BMI.  General Appearance: Disheveled  Eye Contact::  Minimal  Speech:  Normal Rate  Volume:  Decreased  Mood:  Anxious  Affect:  Constricted  Thought Process:  Disorganized  Orientation:  NA  Thought Content:  Disorganized thought processes answers are hyper religious  Suicidal Thoughts:  n/a  Homicidal Thoughts:  n/a  Memory:  NA  Judgement:  Impaired  Insight:  Lacking  Psychomotor Activity:  Normal  Concentration:  NA  Recall:  NA  Fund of Knowledge:NA  Language: Fair  Akathisia:  No  Handed:    AIMS (if indicated):     Assets:  Financial Resources/Insurance Housing Social Support  ADL's:  Intact  Cognition: WNL  Sleep:  Number of Hours: 5.25   Treatment Plan Summary:  Patient was mute today.  Continue to give all oral medications crushed and mixed with apple sauce  Schizophrenia: No significant changes. Continue Zyprexa 20 mg by mouth BID.  Continue Haldol  10 mg by mouth BID   Improving slowly, no plans for med changes today. With encouragement he is taking  oral medications.    On non emergency forced medications since 3/21.  He will receive zyprexa 10 mg IM if he refuses oral olanzapine.    Anxiety: Continue clonazepam 0.5 mg po bid  Agitation: Continue Ativan 2 mg by mouth or IM every 6 hours as needed  EPS: continue amantadine 100 mg po bid   Insomnia: Continue trazodone 100 mg by mouth daily at bedtime  Dyslipidemia: Continue Zocor 20 mg by mouth daily  Hypothyroidism: continue syntrhoid 125 mcg/day  Diabetes: Continue Glucophage at   by mouth twice a day  HTN: h/o HTN but unable to check VS.  Precautions every 15 minute checks  Diet regular  Hospitalization and status continue involuntary commitment  Disposition: Per Child psychotherapist he is not able to return to prior placement which was an ALF.   Still looking for placement.  ACT, DSS and social worker in our facility are all looking for placement for this patient.  Patient allowed the nurse to place a PPD wednesday  Discharge follow-up continue with [facility] services.  Labs: Unable to obtain any labs as patient refuses. Not able to draw lipid panel, hemoglobin A1c, or prolactin levels.  Social issues: Responsible local management entity [facility] Outpatient provider--- psychotherapeutic services ACT team in [place] Patient's guardian is [facility]  Records [facility] from Nov 2015: 26 and [facility] due to refusing psychotropics. Per records at baseline he is hyper religious and response to internal stimuli. Patient was discharged on lisinopril 20 mg, aspirin 81 mg, clonazepam 0.5 mg twice a day, metformin at thousand milligrams twice a day, Synthroid 125 g daily, simvastatin 10 mg a day, Risperdal orally disintegrating tablet 4 mg twice a day, olanzapine orally disintegrating tablet 20 mg twice a day  Levora Angel, MD 10/08/2015, 3:13 PM

## 2015-10-08 NOTE — [place] FL2 (Signed)
[place] MEDICAID FL2 LEVEL OF CARE SCREENING TOOL     IDENTIFICATION  Patient Name: [name] Birthdate: 05-03-1956 Sex: male Admission Date (Current Location): 09/24/2015  [place] Number:  [name] [id number] [facility] Facility and Address:  [facility], [address] [place], [place] [address]      Provider Number: [id number]  Attending Physician Name and Address:  [name]*  Relative Name and Phone Number:  Legal Guardian - [place] [facility] [name]     Current Level of Care: Hospital Recommended Level of Care: Other (Comment) (Group home ) Prior Approval Number:    Date Approved/Denied:   PASRR Number: [id number]  Discharge Plan: Other (Comment) (Group home )    Current Diagnoses: Patient Active Problem List   Diagnosis Date Noted  . Dyslipidemia 09/25/2015  . Undifferentiated schizophrenia (HCC)   . Diabetes (HCC) 09/16/2015  . Hypothyroid 09/16/2015    Orientation RESPIRATION BLADDER Height & Weight     Self, Place  Normal Continent Weight:   Height:     BEHAVIORAL SYMPTOMS/MOOD NEUROLOGICAL BOWEL NUTRITION STATUS     (None ) Continent  (Normal )  AMBULATORY STATUS COMMUNICATION OF NEEDS Skin   Independent Verbally Normal                       Personal Care Assistance Level of Assistance  Bathing, Feeding, Dressing, Total care Bathing Assistance: Independent Feeding assistance: Independent Dressing Assistance: Independent Total Care Assistance: Independent   Functional Limitations Info  Sight, Speech, Hearing Sight Info: Adequate Hearing Info: Adequate Speech Info: Adequate    SPECIAL CARE FACTORS FREQUENCY                       Contractures Contractures Info: Not present    Additional Factors Info  Psychotropic, Allergies   Allergies Info: Depakote, Lithium Psychotropic Info: See med list          Current Medications (10/08/2015):  This is the current hospital active  medication list Current Facility-Administered Medications  Medication Dose Route Frequency Provider Last Rate Last Dose  . acetaminophen (TYLENOL) tablet 650 mg  650 mg Oral Q6H PRN Jimmy Footman, MD      . alum & mag hydroxide-simeth (MAALOX/MYLANTA) 200-200-20 MG/5ML suspension 30 mL  30 mL Oral Q4H PRN Jimmy Footman, MD      . amantadine (SYMMETREL) capsule 100 mg  100 mg Oral BID WC Jimmy Footman, MD   100 mg at 10/08/15 0758  . aspirin chewable tablet 81 mg  81 mg Oral Q breakfast Jimmy Footman, MD   81 mg at 10/08/15 0758  . clonazePAM (KLONOPIN) tablet 0.5 mg  0.5 mg Oral BID WC Jimmy Footman, MD   0.5 mg at 10/08/15 0758  . haloperidol (HALDOL) tablet 10 mg  10 mg Oral BID Jimmy Footman, MD   10 mg at 10/08/15 0758  . levothyroxine (SYNTHROID, LEVOTHROID) tablet 125 mcg  125 mcg Oral QAC breakfast Jimmy Footman, MD   125 mcg at 10/08/15 0758  . LORazepam (ATIVAN) tablet 2 mg  2 mg Oral Q6H PRN Jimmy Footman, MD   2 mg at 09/24/15 1527   Or  . LORazepam (ATIVAN) injection 2 mg  2 mg Intramuscular Q6H PRN Jimmy Footman, MD      . magnesium hydroxide (MILK OF MAGNESIA) suspension 30 mL  30 mL Oral Daily PRN Jimmy Footman, MD      . metFORMIN (GLUCOPHAGE) tablet 1,000 mg  1,000  mg Oral BID WC Jimmy FootmanAndrea Hernandez-Gonzalez, MD   1,000 mg at 10/08/15 0758  . OLANZapine (ZYPREXA) tablet 20 mg  20 mg Oral BID WC Jimmy FootmanAndrea Hernandez-Gonzalez, MD   20 mg at 10/08/15 0757   Or  . OLANZapine (ZYPREXA) injection 10 mg  10 mg Intramuscular BID WC Jimmy FootmanAndrea Hernandez-Gonzalez, MD      . simvastatin (ZOCOR) tablet 20 mg  20 mg Oral q1800 Jimmy FootmanAndrea Hernandez-Gonzalez, MD   20 mg at 10/07/15 1631     Discharge Medications: Please see discharge summary for a list of discharge medications.  Relevant Imaging Results:  Relevant Lab Results:   Additional Information:    Sem[employer] EnergyCandace L Janeth [name],  LCSWA

## 2015-10-08 NOTE — [facility] Group Notes (Signed)
[facility] Group Notes:  (Nursing/MHT/Case Management/Adjunct)  Date:  10/08/2015  Time:  3:07 AM  Type of Therapy:  Group Therapy  Participation Level:  Minimal  Participation Quality:  Resistant  Affect:  Flat  Cognitive:  Disorganized  Insight:  Limited  Engagement in Group:  Limited  Modes of Intervention:  Discussion  Summary of Progress/Problems: When pt was asked about goal, he responded "God would handle it".   Fanny Skatesshley Imani Travia [place] 10/08/2015, 3:07 AM

## 2015-10-08 NOTE — Progress Notes (Signed)
D: Pt affect is irritable this evening. He is seen in the milieu but does not interact with staff or peers. Pt remains religiously preoccupied, stating "God will take care of it." He denies SI/HI/AVH at this time. Denies pain. Pt requires much encouragement to take his medications, which are given with applesauce as ordered. A: Emotional support and encouragement provided. Medications administered with education. q15 minute safety checks maintained. R: Pt remains free from harm. Will continue to monitor.

## 2015-10-08 NOTE — Plan of Care (Signed)
Problem: Alteration in thought process Goal: LTG-Patient behavior demonstrates decreased signs psychosis (Patient behavior demonstrates decreased signs of psychosis to the point the patient is safe to return home and continue treatment in an outpatient setting.)  Outcome: Progressing Patient is displaying less psychotic behaviors currently.

## 2015-10-08 NOTE — [facility] Group Notes (Signed)
[facility] LCSW Group Therapy  10/08/2015 3:17 PM  Type of Therapy:  Group Therapy  Participation Level:  Did Not Attend  Modes of Intervention:  Discussion, Education, Socialization and Support  Summary of Progress/Problems: Self esteem: Patients discussed self esteem and how it impacts them. They discussed what aspects in their lives has influenced their self esteem. They were challenged to identify changes that are needed in order to improve self esteem.    Sheilah Rayos L Tiffanni Scarfo MSW, LCSWA  10/08/2015, 3:17 PM  

## 2015-10-09 NOTE — Progress Notes (Signed)
Recreation Therapy Notes  Date: 04.03.17 Time: 9:55 am Location: Craft Room  Group Topic: Coping Skills  Goal Area(s) Addresses:  Patient will verbalize one positive emotion experienced in group. Patient will participate in healthy coping skill.  Behavioral Response: Did not attend  Intervention: Coloring  Activity: Patients were given coloring sheets and instructed to color. While they were coloring, LRT asked patients to think about the emotions they were experiencing and what they were thinking about.  Education: LRT educated patient on healthy coping skills.  Education Outcome: Patient did not attend group.   Clinical Observations/Feedback: Patient did not attend group.  Jacquelynn CreeGreene,Appollonia Klee M, LRT/CTRS 10/09/2015 4:28 PM

## 2015-10-09 NOTE — Progress Notes (Signed)
Patient ID: [name], male   DOB: February 03, 1956, 60 y.o.   MRN: [id number]  [facility] MD Progress Note  10/09/2015 10:11 AM Alphons Burgert  MRN:  [id number]   Subjective:    The patient continues to be isolative and does not interact with staff or peers although he is visible in the day room and sometimes pacing on the unit. When approached today the patient actually told me "good morning". He started talking about basketball but he was very hard to understand what he was saying.  He also started talking about somebody taking him to see a place that he was going to live in but again it is very hard to understand.  Patient has a very strong body other .  He is wearing multiple letters of clothing all his clothes are Brunette there are multiple stains on them.    Unable to provide information to complete a review of system or a review of psychiatric symptoms. Still refusing blood work and vitals  Per nursing he has been compliant with medications.  Per nursing: D: Pt is seen laying in his bed with the covers over his head this evening. Upon approach he is reluctant to take the covers off. He remains hyper religious, answering assessment questions with "God will help." He takes his medications willingly.   Principal Problem: Undifferentiated schizophrenia (HCC) Diagnosis:   Patient Active Problem List   Diagnosis Date Noted  . Dyslipidemia [E78.5] 09/25/2015  . Undifferentiated schizophrenia (HCC) [F20.3]   . Diabetes (HCC) [E11.9] 09/16/2015  . Hypothyroid [E03.9] 09/16/2015   Total Time spent with patient: 30 min    Past Medical History:  Past Medical History  Diagnosis Date  . Schizophrenia (HCC)   . Diabetes mellitus without complication (HCC)   . Thyroid disease   . Hyperlipemia   . Sickle cell trait (HCC)   . HTN (hypertension)   . TIA (transient ischemic attack) in 2009  . Seizure (HCC) While toxic on Depakote  . Lithium toxicity   . Neutropenia (HCC)   . Hyperammonemia (HCC)  While on Depakote   History reviewed. No pertinent past surgical history.  Social History:  History  Alcohol Use: Not on file     History  Drug Use Not on file    Social History   Social History  . Marital Status: Single    Spouse Name: N/A  . Number of Children: N/A  . Years of Education: N/A   Social History Main Topics  . Smoking status: Unknown If Ever Smoked  . Smokeless tobacco: None  . Alcohol Use: None  . Drug Use: None  . Sexual Activity: Not Asked   Other Topics Concern  . None   Social History Narrative      Current Medications: Current Facility-Administered Medications  Medication Dose Route Frequency Provider Last Rate Last Dose  . acetaminophen (TYLENOL) tablet 650 mg  650 mg Oral Q6H PRN Jimmy Footman, MD      . alum & mag hydroxide-simeth (MAALOX/MYLANTA) 200-200-20 MG/5ML suspension 30 mL  30 mL Oral Q4H PRN Jimmy Footman, MD      . amantadine (SYMMETREL) capsule 100 mg  100 mg Oral BID WC Jimmy Footman, MD   100 mg at 10/09/15 0909  . aspirin chewable tablet 81 mg  81 mg Oral Q breakfast Jimmy Footman, MD   81 mg at 10/09/15 0909  . clonazePAM (KLONOPIN) tablet 0.5 mg  0.5 mg Oral BID WC Jimmy Footman, MD   0.5 mg at  10/09/15 0909  . haloperidol (HALDOL) tablet 10 mg  10 mg Oral BID Jimmy FootmanAndrea Hernandez-Gonzalez, MD   10 mg at 10/09/15 0909  . levothyroxine (SYNTHROID, LEVOTHROID) tablet 125 mcg  125 mcg Oral QAC breakfast Jimmy FootmanAndrea Hernandez-Gonzalez, MD   125 mcg at 10/09/15 [phone]  . LORazepam (ATIVAN) tablet 2 mg  2 mg Oral Q6H PRN Jimmy FootmanAndrea Hernandez-Gonzalez, MD   2 mg at 09/24/15 1527   Or  . LORazepam (ATIVAN) injection 2 mg  2 mg Intramuscular Q6H PRN Jimmy FootmanAndrea Hernandez-Gonzalez, MD      . magnesium hydroxide (MILK OF MAGNESIA) suspension 30 mL  30 mL Oral Daily PRN Jimmy FootmanAndrea Hernandez-Gonzalez, MD      . metFORMIN (GLUCOPHAGE) tablet 1,000 mg  1,000 mg Oral BID WC Jimmy FootmanAndrea Hernandez-Gonzalez, MD    1,000 mg at 10/09/15 0908  . OLANZapine (ZYPREXA) tablet 20 mg  20 mg Oral BID WC Jimmy FootmanAndrea Hernandez-Gonzalez, MD   20 mg at 10/09/15 0908   Or  . OLANZapine (ZYPREXA) injection 10 mg  10 mg Intramuscular BID WC Jimmy FootmanAndrea Hernandez-Gonzalez, MD      . simvastatin (ZOCOR) tablet 20 mg  20 mg Oral q1800 Jimmy FootmanAndrea Hernandez-Gonzalez, MD   20 mg at 10/08/15 1700    Lab Results: No results found for this or any previous visit (from the past 48 hour(s)).  Blood Alcohol level:  Lab Results  Component Value Date   ETH <5 09/16/2015    Physical Findings: AIMS: Facial and Oral Movements Muscles of Facial Expression: None, normal, ,  ,  ,        Musculoskeletal: Strength & Muscle Tone: within normal limits Gait & Station: normal Patient leans: N/A  Psychiatric Specialty Exam: Review of Systems  Unable to perform ROS: mental acuity  Neurological: Negative.     There were no vitals taken for this visit.There is no height or weight on file to calculate BMI.  General Appearance: Disheveled  Eye Contact::  Minimal  Speech:  Normal Rate  Volume:  Decreased  Mood:  Anxious  Affect:  Constricted  Thought Process:  Disorganized  Orientation:  NA  Thought Content:  Disorganized thought processes answers are hyper religious  Suicidal Thoughts:  n/a  Homicidal Thoughts:  n/a  Memory:  NA  Judgement:  Impaired  Insight:  Lacking  Psychomotor Activity:  Normal  Concentration:  NA  Recall:  NA  Fund of Knowledge:NA  Language: Fair  Akathisia:  No  Handed:    AIMS (if indicated):     Assets:  Financial Resources/Insurance Housing Social Support  ADL's:  Intact  Cognition: WNL  Sleep:  Number of Hours: 4   Treatment Plan Summary:  Mr. Georgina QuintFeeley appears to have significant improvement. He has been compliant with medications. He has worked really well to give him the medications right after breakfast and dinner mixed with applesauce or ice cream. He is hygiene is poor, there was a strong body.   Thought process is hard to follow but the patient is more talkative. Today he is talking about basketball and [place]  won against another team.   ACT team nurse practitioner came to visit him on Thursday of last week.  She reported was that she has never seen him doing this well. She stated that she has never actually seen him standing or outside his bed.  Schizophrenia: No significant changes. Continue Zyprexa 20 mg by mouth BID.  Continue Haldol  10 mg by mouth BID   Improving slowly, no plans for med changes  today. With encouragement he is taking oral medications.    On non emergency forced medications since 3/21.  He will receive zyprexa 10 mg IM if he refuses oral olanzapine.    Anxiety: Continue clonazepam 0.5 mg po bid  Agitation: Continue Ativan 2 mg by mouth or IM every 6 hours as needed  EPS: continue amantadine 100 mg po bid   Insomnia: Continue trazodone 100 mg by mouth daily at bedtime  Dyslipidemia: Continue Zocor 20 mg by mouth daily  Hypothyroidism: continue syntrhoid 125 mcg/day  Diabetes: Continue Glucophage at   by mouth twice a day  HTN: h/o HTN but unable to check VS.    Precautions every 15 minute checks  Diet regular  Hospitalization and status continue involuntary commitment  Disposition: Per Child psychotherapist he is not able to return to prior placement which was an ALF.   Still looking for placement.  ACT, DSS and social worker in our facility are all looking for placement for this patient.  Patient allowed the nurse to place a PPD wednesday  Discharge follow-up continue with [facility] services.  Labs: Unable to obtain any labs as patient refuses. Not able to draw lipid panel, hemoglobin A1c, or prolactin levels.  Social issues: Responsible local management entity ---[facility] Outpatient provider--- psychotherapeutic services ACT team in [place] Patient's guardian is [facility]  Records from [facility] from Nov 2015: 26 and [facility] hospital admissions due to refusing psychotropics. Per records at baseline he is hyper religious and response to internal stimuli. Patient was discharged on lisinopril 20 mg, aspirin 81 mg, clonazepam 0.5 mg twice a day, metformin at thousand milligrams twice a day, Synthroid 125 g daily, simvastatin 10 mg a day, Risperdal orally disintegrating tablet 4 mg twice a day, olanzapine orally disintegrating tablet 20 mg twice a day  Jimmy Footman, MD 10/09/2015, 10:11 AM

## 2015-10-09 NOTE — Progress Notes (Signed)
D: Pt is seen laying in his bed with the covers over his head this evening. Upon approach he is reluctant to take the covers off. He remains hyper religious, answering assessment questions with "God will help." He takes his medications willingly. A: Emotional support and encouragement provided. Medications administered with education. q15 minute safety checks maintained. R: Pt remains free from harm. Will continue to monitor.

## 2015-10-09 NOTE — Progress Notes (Signed)
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  R:  Pt isolates, comes to dayroom for meals, needs some redirection.

## 2015-10-09 NOTE — Plan of Care (Signed)
Problem: Alteration in thought process Goal: LTG-Patient has not harmed self or others in at least 2 days Outcome: Progressing Pt remains free from harm. Goal: STG-Patient is able to follow short directions Outcome: Progressing Pt follows directions from staff related to medication administration.

## 2015-10-10 DIAGNOSIS — I10 Essential (primary) hypertension: Secondary | ICD-10-CM

## 2015-10-10 NOTE — [place] FL2 (Signed)
[place] MEDICAID FL2 LEVEL OF CARE SCREENING TOOL     IDENTIFICATION  Patient Name: [name] Birthdate: [birth year] Sex: male Admission Date (Current Location): 09/24/2015  [place] and [place] Number:  [name] [id number] [facility] Facility and Address:  [facility], [address] [place], [place] [address]      Provider Number: [id number]  Attending Physician Name and Address:  Barnabas Harries*  Relative Name and Phone Number:  Legal Guardian - [place] DSS [name]     Current Level of Care: Hospital Recommended Level of Care: Other (Comment) (Group home ) Prior Approval Number:    Date Approved/Denied:   PASRR Number: [id number]  Discharge Plan: Other (Comment) (Group home )    Current Diagnoses: Patient Active Problem List   Diagnosis Date Noted  . HTN (hypertension) 10/10/2015  . Dyslipidemia 09/25/2015  . Undifferentiated schizophrenia (HCC)   . Diabetes (HCC) 09/16/2015  . Hypothyroid 09/16/2015    Orientation RESPIRATION BLADDER Height & Weight     Self, Place  Normal Continent Weight:   Height:     BEHAVIORAL SYMPTOMS/MOOD NEUROLOGICAL BOWEL NUTRITION STATUS     (None ) Continent  (Normal )  AMBULATORY STATUS COMMUNICATION OF NEEDS Skin   Independent Verbally Normal                       Personal Care Assistance Level of Assistance  Bathing, Feeding, Dressing, Total care Bathing Assistance: Independent Feeding assistance: Independent Dressing Assistance: Independent Total Care Assistance: Independent   Functional Limitations Info  Sight, Speech, Hearing Sight Info: Adequate Hearing Info: Adequate Speech Info: Adequate    SPECIAL CARE FACTORS FREQUENCY                       Contractures Contractures Info: Not present    Additional Factors Info  Psychotropic, Allergies   Allergies Info: Depakote, Lithium Psychotropic Info: See med list          Current Medications (10/10/2015):  This is  the current hospital active medication list Current Facility-Administered Medications  Medication Dose Route Frequency Provider Last Rate Last Dose  . acetaminophen (TYLENOL) tablet 650 mg  650 mg Oral Q6H PRN Jimmy Footman, MD      . alum & mag hydroxide-simeth (MAALOX/MYLANTA) 200-200-20 MG/5ML suspension 30 mL  30 mL Oral Q4H PRN Jimmy Footman, MD      . amantadine (SYMMETREL) capsule 100 mg  100 mg Oral BID WC Jimmy Footman, MD   100 mg at 10/10/15 0815  . aspirin chewable tablet 81 mg  81 mg Oral Q breakfast Jimmy Footman, MD   81 mg at 10/10/15 0815  . clonazePAM (KLONOPIN) tablet 0.5 mg  0.5 mg Oral BID WC Jimmy Footman, MD   0.5 mg at 10/10/15 0815  . haloperidol (HALDOL) tablet 10 mg  10 mg Oral BID Jimmy Footman, MD   10 mg at 10/10/15 0815  . levothyroxine (SYNTHROID, LEVOTHROID) tablet 125 mcg  125 mcg Oral QAC breakfast Jimmy Footman, MD   125 mcg at 10/10/15 [id number]  . LORazepam (ATIVAN) tablet 2 mg  2 mg Oral Q6H PRN Jimmy Footman, MD   2 mg at 09/24/15 1527   Or  . LORazepam (ATIVAN) injection 2 mg  2 mg Intramuscular Q6H PRN Jimmy Footman, MD      . magnesium hydroxide (MILK OF MAGNESIA) suspension 30 mL  30 mL Oral Daily PRN Jimmy Footman, MD      . metFORMIN (GLUCOPHAGE)  tablet 1,000 mg  1,000 mg Oral BID WC Jimmy FootmanAndrea Hernandez-Gonzalez, MD   1,000 mg at 10/10/15 0815  . OLANZapine (ZYPREXA) tablet 20 mg  20 mg Oral BID WC Jimmy FootmanAndrea Hernandez-Gonzalez, MD   20 mg at 10/10/15 [id number]   Or  . OLANZapine (ZYPREXA) injection 10 mg  10 mg Intramuscular BID WC Jimmy FootmanAndrea Hernandez-Gonzalez, MD      . simvastatin (ZOCOR) tablet 20 mg  20 mg Oral q1800 Jimmy FootmanAndrea Hernandez-Gonzalez, MD   20 mg at 10/09/15 1747     Discharge Medications: Please see discharge summary for a list of discharge medications.  Relevant Imaging Results:  Relevant Lab Results:   Additional  Information Pt was not taking his meds at his former group home and has been admitted in order to stabilize on medications  Dorothe PeaJonathan F Keslyn Teater, LCSW

## 2015-10-10 NOTE — Plan of Care (Signed)
Problem: Alteration in thought process Goal: STG-Patient is able to sleep at least 6 hours per night Outcome: Progressing Patient is able to sleep 6 hours at night currently

## 2015-10-10 NOTE — Tx Team (Signed)
Interdisciplinary Treatment Plan Update (Adult)        Date: 10/10/2015   Time Reviewed: 9:30 AM   Progress in Treatment: Improving  Attending groups: Intermittently Participating in groups: Intermittently Taking medication as prescribed: Yes  Tolerating medication: Yes  Family/Significant other contact made: Yes, CSW has spoken to the pt's group home and the pt's DSS social worker [name] Patient understands diagnosis: Yes  Discussing patient identified problems/goals with staff: Yes  Medical problems stabilized or resolved: Yes  Denies suicidal/homicidal ideation: Yes  Issues/concerns per patient self-inventory: Yes  Other:   New problem(s) identified: N/A   Discharge Plan or Barriers: Pt will be returning to [facility] for long-term residential care, to the [facility] for his primary medical follow up, with [facility] for medication management and therapy and with [facility] for assistance with placement and legal guardianship.  Reason for Continuation of Hospitalization:   Depression   Anxiety   Medication Stabilization   Comments: N/A   Estimated date of discharge: 10/10/15            Pt is a 60 year rold male arrived to our ER on 3/11 and was admitted due to concerns of his group home for his mental and physical health.  Pt lives in [place].  [name] is a 6'6" tall, 230 lbs 60 y.o., African American male, who presents to the ER due to his [place] having concerns about his physical and mental health. Majority of the information gathered for this assessment was from patient Medical chart and Group Home Staff.  Patient has a history of stopping his medications and being religious preoccupied.  According to the Group Home, 3 or 4 days prior to coming here the patient show more severe psychotic symptoms. He only has been eating once a day and has been refusing medications. Group Home staff  states, they are unsure how long he has gone without his medications. They found some of his medications in his room, when they were cleaning it. Patient usually take his meds in front of staff and is also required to check his mouth. However, he has learned how to hide the pills in his mouth, so staff can't see them. When he goes to his room, he hides them in his closet. Patient goes in his closet and sit there for several hours "mumbling to his self." In the past, patient was going to his closet to prayer, "because that is what [name] said in the scriptures."  Patient hygiene has decrease. Patient has refused to bathe and has had on the same clothes for several days. Patient refused to take them off and allow Group Home staff wash them. Patient is currently disheveled and have a strong odor coming from his person.   Upon arrival to the ER it took several staff members to restrain him to get lab work. They were unsuccessful with having him change his clothing.  His labs revealed hypernatremia his sodium was 151. He was in acute renal failure.  Creatinine was 1.52 and his GFR was 56 and BUN 46.  Test results are likely due to dehydration and poor oral intake.  Due to his high level of agitation patient was placed on the waiting list from [facility] where he has been hospitalized a multitude of times in the past. The patient was in our emergency department awaiting for bed there from 3/11 to 3/19.  File decision was made to  hospitalize him yesterday after having several days of no aggression, no agitation. The patient per nursing in the emergency room, has been compliant with all his oral medications and has been eating and drinking with no difficulties. Patient has been refusing daily vitals.    Laboratory work was repeated on March 14. Showing resolution of hypernatremia and acute renal insufficiency.  Patient is currently living at "[facility][phone])." He  moved in 04/2015, from [facility] Group Home staff was unsure from what hospital he discharged from.  [place], is his Guardian [name]).  Patient currently receives Outpatient Treatment with [facility] [phone]), with their ACT Team.  Per Dr's notes: I saw this patient today in 2 days prior to this admission while he was in the emergency room. His answers to my questions are irrelevant. Most of the statements he made our about God and God will. He is unable to complete a review of systems form psychiatric and nonpsychiatric symptoms.  Substance abuse history: No known history of substance abuse.  Patient and CSW reviewed pt's identified goals and treatment plan. Pt verbalized understanding and agreed to treatment plan.        Review of initial/current patient goals per problem list:  1. Goal(s): Patient will participate in aftercare plan   Met: Yes  Target date: 3-5 days post admission date   As evidenced by: Patient will participate within aftercare plan AEB aftercare provider and housing plan at discharge being identified.   3/21: CSW still assessing for appropriate contacts  3/23: Pt will be admitted into [facility] upon discharge for long-term residential treatment, therapy and medication managment   4/4: Pt will be returning to [facility] for long-term residential care, to the [facility] for his primary medical follow up, with [facility] for medication management and therapy and with [facility] for assistance with placement and legal guardianship.    2. Goal (s): Patient will exhibit decreased depressive symptoms and suicidal ideations.   Met: Adequate for discharge per MD.  Target date: 3-5 days post admission date   As evidenced by: Patient will utilize self-rating of depression at 3 or below and demonstrate decreased signs of depression  or be deemed stable for discharge by MD.   3/21: Goal progressing.  3/23: Goal progressing.  3/30: Goal progressing.  4/4: Adequate for discharge per MD.    3. Goal(s): Patient will demonstrate decreased signs and symptoms of anxiety.   Met: Adequate for discharge per MD.  Target date: 3-5 days post admission date   As evidenced by: Patient will utilize self-rating of anxiety at 3 or below and demonstrated decreased signs of anxiety, or be deemed stable for discharge by MD   3/21: Goal progressing  3/23: Goal progressing.  3/30: Goal progressing.  4/4: Adequate for discharge per MD.     4. Goal(s): Patient will demonstrate decreased signs of psychosis  * Met:  Adequate for discharge per MD. * Target date: 3-5 days post admission date  * As evidenced by: Patient will demonstrate decreased frequency of AVH or return to baseline function   3/21: Goal progressing.  3/23: Goal progressing.  3/30: Goal progressing.  4/4: Adequate for discharge per MD.     Attendees:  Patient:  Family:  Physician: Dr. Jerilee Hoh, MD     10/10/2015 9:30 AM  Nursing: Polly Cobia, RN      10/10/2015 9:30 AM  Clinical  Social Worker: Britta Mccreedy   10/10/2015 9:30 AM  Clinical Social Worker:     10/10/2015 9:30 AM  Clinical Social Worker:     10/10/2015 9:30 AM  Nursing: Nicanor Bake , RN                 10/10/2015 9:30 AM  Nursing:   Delfin Edis, RN                10/10/2015 9:30 AM

## 2015-10-10 NOTE — Plan of Care (Signed)
Problem: Alteration in thought process Goal: LTG-Patient has not harmed self or others in at least 2 days Outcome: Progressing Patient has not harmed himself in at least 2 days

## 2015-10-10 NOTE — Progress Notes (Addendum)
D: Pt continues to be disorganized and religiously preoccupied. He is seen in the milieu but does not interact with peers. Pt takes medications when asked but refuses to open mouth for mouth check. Pt continues to refuse morning vital signs. A: Emotional support and encouragement provided. Medications administered with education. q15 minute safety checks maintained. R: Pt remains free from harm. Will continue to monitor.

## 2015-10-10 NOTE — Progress Notes (Signed)
  [facility] :  Will you be returning to the same living situation after discharge:  Yes,  pt [place] At discharge, do you have transportation home?: Yes,  pt will be picked up by his group home Do you have the ability to pay for your medications: Yes,  pt will be provided with prescriptions at discharge  Release of information consent forms completed and in the chart;  Patient's signature needed at discharge.  Patient to Follow up at: Follow-up Information    Follow [facility] with [facility].   Why:  Please arrive for your hospital follow up on Wednesday April the 5th at 1:20pm with Dr. Sallee LangeSoles for your medical examination and with Dr. Alisia FerrariElizabeth Childs for medication managment and an assessment for therapy.  Please bring your discharge paperwork.     Contact information:   [address] [place], [place] [address] Phone: [phone] Fax: [phone]       Follow up with [facility].   Why:  Please arrive to the group home to be admitted for long-term residential care   Contact information:   [address],  [place], [place] [address] Phone: [phone] Fax: [phone]      Follow [facility] with [facility] - Lazaro ArmsKay Flack.   Why:  Please call Lazaro ArmsKay Flack, your legal guardian at [phone] to facilitate any future issues or for solutions to concerns and questions   Contact information:   [address]  [place], [place] [address] [phone] Fax: [phone]      Follow up with [facility].   Why:  Please contact your ACT Team representative Alysha to schedule your hospital follow up for medication management and therapy upon discharge at ph: [phone]   Contact information:   [facility]  [address] [place], [place] [address] Phone: [phone] Fax: [phone]       Next level of care provider has access to [facility] Link:no  Safety Planning and Suicide Prevention discussed: No. Pt refused SPE from the CSW.  Have you used any form of tobacco in the last 30 days? (Cigarettes, Smokeless Tobacco, Cigars, and/or Pipes): Patient Refused Screening  Has patient been referred to the Quitline?: Patient refused referral  Patient has been referred for addiction treatment: N/A  Dorothe PeaJonathan F Adreyan Carbajal 10/10/2015, 3:36 PM

## 2015-10-10 NOTE — Progress Notes (Signed)
Recreation Therapy Notes  Date: 04.04.17 Time: 9:35 am Location: Craft Room  Group Topic: Self-expression  Goal Area(s) Addresses:  Patient will identify one color per emotion listed on wheel. Patient will verbalize one benefit of using art as a means of self-expression. Patient will verbalize one emotion experienced during session. Patient will be educated on other forms of self-expression.  Behavioral Response: Did not attend  Intervention: Emotion Wheel  Activity: Patients were given an emotion wheel and instructed to pick a color for each emotion listed on the wheel.  Education: LRT educated patients on different forms on self-expression.  Education Outcome: Patient did not attend group.   Clinical Observations/Feedback: Patient did not attend group.  Jacquelynn CreeGreene,Matayah Reyburn M, LRT/CTRS 10/10/2015 12:09 PM

## 2015-10-10 NOTE — Plan of Care (Signed)
Problem: Alteration in thought process Goal: STG-Patient is able to follow short directions Outcome: Progressing Pt follows directions regarding medication administration.   Problem: Ineffective individual coping Goal: STG-Increase in ability to manage activities of daily living Outcome: Not Progressing Pt has poor hygiene and body odor.

## 2015-10-10 NOTE — Progress Notes (Signed)
D:  Patient expresses readiness for discharge today.  Patient denies any pain currently.  A:  Medications and instructions for their use were reviewed with the patient and caregiver and they voiced understanding.  Discharge instructions and follow up were reviewed with the patient and caregiver.  Patient's belongings were returned upon his leaving the unit. R:  Patient was cooperative with the discharge process.  Patient's caregiver expressed understanding of discharge instructions, follow up, medications and their use.  Patient was escorted off the unit.  Patient remains safe at the time of discharge.

## 2015-11-21 ENCOUNTER — Emergency Department
Admission: EM | Admit: 2015-11-21 | Discharge: 2015-11-24 | Disposition: A | Discharge status: Home / Self Care | Payer: Medicare Other | Attending: Student | Admitting: Student

## 2015-11-21 DIAGNOSIS — G2401 Drug induced subacute dyskinesia: Secondary | ICD-10-CM

## 2015-11-21 DIAGNOSIS — R461 Bizarre personal appearance: Secondary | ICD-10-CM | POA: Diagnosis present

## 2015-11-21 DIAGNOSIS — E785 Hyperlipidemia, unspecified: Secondary | ICD-10-CM | POA: Insufficient documentation

## 2015-11-21 DIAGNOSIS — F29 Unspecified psychosis not due to a substance or known physiological condition: Secondary | ICD-10-CM | POA: Diagnosis not present

## 2015-11-21 DIAGNOSIS — Z8673 Personal history of transient ischemic attack (TIA), and cerebral infarction without residual deficits: Secondary | ICD-10-CM | POA: Insufficient documentation

## 2015-11-21 DIAGNOSIS — Z91199 Patient's noncompliance with other medical treatment and regimen due to unspecified reason: Secondary | ICD-10-CM

## 2015-11-21 DIAGNOSIS — F209 Schizophrenia, unspecified: Secondary | ICD-10-CM | POA: Insufficient documentation

## 2015-11-21 DIAGNOSIS — F203 Undifferentiated schizophrenia: Secondary | ICD-10-CM | POA: Diagnosis present

## 2015-11-21 DIAGNOSIS — E039 Hypothyroidism, unspecified: Secondary | ICD-10-CM | POA: Diagnosis not present

## 2015-11-21 DIAGNOSIS — Z7984 Long term (current) use of oral hypoglycemic drugs: Secondary | ICD-10-CM | POA: Diagnosis not present

## 2015-11-21 DIAGNOSIS — E119 Type 2 diabetes mellitus without complications: Secondary | ICD-10-CM | POA: Diagnosis not present

## 2015-11-21 DIAGNOSIS — I10 Essential (primary) hypertension: Secondary | ICD-10-CM | POA: Diagnosis not present

## 2015-11-21 DIAGNOSIS — R7303 Prediabetes: Secondary | ICD-10-CM

## 2015-11-21 DIAGNOSIS — Z79899 Other long term (current) drug therapy: Secondary | ICD-10-CM | POA: Insufficient documentation

## 2015-11-21 DIAGNOSIS — Z7982 Long term (current) use of aspirin: Secondary | ICD-10-CM | POA: Insufficient documentation

## 2015-11-21 DIAGNOSIS — Z8669 Personal history of other diseases of the nervous system and sense organs: Secondary | ICD-10-CM | POA: Diagnosis not present

## 2015-11-21 DIAGNOSIS — Z9119 Patient's noncompliance with other medical treatment and regimen: Secondary | ICD-10-CM

## 2015-11-21 DIAGNOSIS — F23 Brief psychotic disorder: Secondary | ICD-10-CM

## 2015-11-21 LAB — CBC
HCT: 36.7 % — ABNORMAL LOW (ref 40.0–52.0)
Hemoglobin: 11.9 g/dL — ABNORMAL LOW (ref 13.0–18.0)
MCH: 25.7 pg — ABNORMAL LOW (ref 26.0–34.0)
MCHC: 32.4 g/dL (ref 32.0–36.0)
MCV: 79.2 fL — ABNORMAL LOW (ref 80.0–100.0)
Platelets: 190 10*3/uL (ref 150–440)
RBC: 4.63 MIL/uL (ref 4.40–5.90)
RDW: 14.2 % (ref 11.5–14.5)
WBC: 5.2 10*3/uL (ref 3.8–10.6)

## 2015-11-21 LAB — COMPREHENSIVE METABOLIC PANEL
ALT: 12 U/L — ABNORMAL LOW (ref 17–63)
AST: 17 U/L (ref 15–41)
Albumin: 4 g/dL (ref 3.5–5.0)
Alkaline Phosphatase: 54 U/L (ref 38–126)
Anion gap: 7 (ref 5–15)
BUN: 16 mg/dL (ref 6–20)
CO2: 26 mmol/L (ref 22–32)
Calcium: 8.6 mg/dL — ABNORMAL LOW (ref 8.9–10.3)
Chloride: 109 mmol/L (ref 101–111)
Creatinine, Ser: 1.12 mg/dL (ref 0.61–1.24)
GFR calc Af Amer: >60 mL/min (ref >60)
GFR calc non Af Amer: >60 mL/min (ref >60)
Glucose, Bld: 98 mg/dL (ref 65–99)
Potassium: 3.8 mmol/L (ref 3.5–5.1)
Sodium: 142 mmol/L (ref 135–145)
Total Bilirubin: 0.4 mg/dL (ref 0.3–1.2)
Total Protein: 6.8 g/dL (ref 6.5–8.1)

## 2015-11-21 LAB — SALICYLATE LEVEL

## 2015-11-21 LAB — ETHANOL: Alcohol, Ethyl (B): <5 mg/dL (ref <5)

## 2015-11-21 LAB — ACETAMINOPHEN LEVEL: Acetaminophen (Tylenol), Serum: <10 ug/mL — ABNORMAL LOW (ref 10–30)

## 2015-11-21 MED ORDER — LISINOPRIL 20 MG PO TABS
20.0000 mg | ORAL_TABLET | Freq: Every day | ORAL | Status: DC
  Administered 2015-11-21 – 2015-11-24 (×4): 20 mg via ORAL
  Filled 2015-11-21: qty 2
  Filled 2015-11-21 (×2): qty 1

## 2015-11-21 MED ORDER — ASPIRIN EC 81 MG PO TBEC
81.0000 mg | DELAYED_RELEASE_TABLET | Freq: Every day | ORAL | Status: DC
  Administered 2015-11-21 – 2015-11-24 (×4): 81 mg via ORAL
  Filled 2015-11-21 (×3): qty 1

## 2015-11-21 MED ORDER — AMANTADINE HCL 100 MG PO CAPS
100.0000 mg | ORAL_CAPSULE | Freq: Two times a day (BID) | ORAL | Status: DC
  Administered 2015-11-21 – 2015-11-24 (×6): 100 mg via ORAL
  Filled 2015-11-21 (×8): qty 1

## 2015-11-21 MED ORDER — OLANZAPINE 10 MG PO TBDP
20.0000 mg | ORAL_TABLET | Freq: Every day | ORAL | Status: DC
  Administered 2015-11-22 – 2015-11-23 (×2): 20 mg via ORAL
  Filled 2015-11-21 (×4): qty 2

## 2015-11-21 MED ORDER — CLONAZEPAM 0.5 MG PO TABS
0.5000 mg | ORAL_TABLET | Freq: Two times a day (BID) | ORAL | Status: DC
  Administered 2015-11-21 – 2015-11-24 (×6): 0.5 mg via ORAL
  Filled 2015-11-21 (×6): qty 1

## 2015-11-21 MED ORDER — SIMVASTATIN 10 MG PO TABS
10.0000 mg | ORAL_TABLET | Freq: Every day | ORAL | Status: DC
  Administered 2015-11-23: 10 mg via ORAL
  Filled 2015-11-21 (×4): qty 1

## 2015-11-21 MED ORDER — HALOPERIDOL 5 MG PO TABS
10.0000 mg | ORAL_TABLET | Freq: Two times a day (BID) | ORAL | Status: DC
  Administered 2015-11-21 – 2015-11-24 (×6): 10 mg via ORAL
  Filled 2015-11-21 (×6): qty 2

## 2015-11-21 MED ORDER — LEVOTHYROXINE SODIUM 25 MCG PO TABS
125.0000 ug | ORAL_TABLET | Freq: Every day | ORAL | Status: DC
  Administered 2015-11-22 – 2015-11-24 (×3): 125 ug via ORAL
  Filled 2015-11-21 (×5): qty 1

## 2015-11-21 MED ORDER — ASPIRIN 81 MG PO CHEW
CHEWABLE_TABLET | ORAL | Status: AC
  Administered 2015-11-21: 81 mg
  Filled 2015-11-21: qty 1

## 2015-11-21 MED ORDER — HALOPERIDOL 5 MG PO TABS
ORAL_TABLET | ORAL | Status: AC
Start: 2015-11-21 — End: 2015-11-21
  Administered 2015-11-21: 10 mg via ORAL
  Filled 2015-11-21: qty 2

## 2015-11-21 MED ORDER — LISINOPRIL 10 MG PO TABS
ORAL_TABLET | ORAL | Status: AC
  Administered 2015-11-21: 20 mg via ORAL
  Filled 2015-11-21: qty 2

## 2015-11-21 MED ORDER — RISPERIDONE 2 MG PO TBDP: 4.0000 mg | ORAL_TABLET | Freq: Two times a day (BID) | ORAL | Status: DC

## 2015-11-21 MED ORDER — OLANZAPINE 10 MG PO TABS
ORAL_TABLET | ORAL | Status: AC
  Filled 2015-11-21: qty 2

## 2015-11-21 MED ORDER — CLONAZEPAM 0.5 MG PO TABS
ORAL_TABLET | ORAL | Status: AC
Start: 2015-11-21 — End: 2015-11-21
  Administered 2015-11-21: 0.5 mg via ORAL
  Filled 2015-11-21: qty 1

## 2015-11-21 NOTE — ED Notes (Signed)
meds administered as ordered   Amantadine has not come from pharmacy

## 2015-11-21 NOTE — BH Assessment (Signed)
Assessment Note  [name] is an 60 y.o. male. Who reportedly has been transported to the ED by his group home. Writer was unable to conduct a thorough assessment due the pt altered mental status. Pt conversation is detailed and pt continues to provide irrelevant information. Pt states " that his spirit has led him here." Pt did not respond when asked where when resides,Pt would only stat that it was an unhealthy house." Pt reports that the spirit is kind of over taking him and that sometimes this can be frustrating. Pt reports medication noncompliance  x1 week.  Pt. denies any suicidal ideation, plan or intent. Pt. does vaguely endorse the presence of auditory and visual hallucinations at this time, stating " I do hear a guy talking to me." When asked how he is sleeping the pt states " the spirit keeps me up." Pt preoccupied and hyper religious.    Diagnosis: Psychosis   Past Medical History:  Past Medical History  Diagnosis Date  . Schizophrenia (HCC)   . Diabetes mellitus without complication (HCC)   . Thyroid disease   . Hyperlipemia   . Sickle cell trait (HCC)   . HTN (hypertension)   . TIA (transient ischemic attack) in 2009  . Seizure (HCC) While toxic on Depakote  . Lithium toxicity   . Neutropenia (HCC)   . Hyperammonemia (HCC) While on Depakote    History reviewed. No pertinent past surgical history.  Family History: No family history on file.  Social History:  reports that he does not drink alcohol. His tobacco and drug histories are not on file.  Additional Social History:  Alcohol / Drug Use Pain Medications: See PTA Prescriptions: See PTA Over the Counter: See PTA History of alcohol / drug use?: No history of alcohol / drug abuse Longest period of sobriety (when/how long): No none history of the use of mind altering substances  CIWA: CIWA-Ar BP: 98/75 mmHg Pulse Rate: (!) 112 COWS:    Allergies:  Allergies  Allergen Reactions  . Depakote [Valproic Acid]      hyperammonemia  . Lithium     Prior history of lithium toxicity due to frequent refusal to eat or drink    Home Medications:  (Not in a hospital admission)  OB/GYN Status:  No LMP for male patient.  General Assessment Data Location of Assessment: [facility] ED [name] Assessment: In system Is this a Tele or Face-to-Face Assessment?: Face-to-Face Is this an Initial Assessment or a Re-assessment for this encounter?: Initial Assessment Marital status: Single Is patient pregnant?: No Pregnancy Status: No Living Arrangements: Group Home Can pt return to current living arrangement?: Yes Admission Status: Voluntary Is patient capable of signing voluntary admission?: No Referral Source: Other (Group Home ) Insurance type: Medicare  Medical Screening Exam [facility] Walk-in ONLY) Medical Exam completed: Yes  Crisis Care Plan Living Arrangements: Group Home Legal Guardian: Other: (Unknown ) Name of Psychiatrist: Unknown  Name of Therapist: UTA  Education Status Is patient currently in school?: No Current Grade: N/A Highest grade of school patient has completed: UTA Name of school: N/A Contact person: N/A  Risk to self with the past 6 months Suicidal Ideation: No Has patient been a risk to self within the past 6 months prior to admission? : Other (comment) (pt denies ) Suicidal Intent: No Has patient had any suicidal intent within the past 6 months prior to admission? : No Is patient at risk for suicide?: No Suicidal Plan?: No Has patient had any suicidal plan  within the past 6 months prior to admission? : No Access to Means: No What has been your use of drugs/alcohol within the last 12 months?: None Noted Previous Attempts/Gestures:  (UTA) How many times?:  (UTA) Other Self Harm Risks:  (UTA) Triggers for Past Attempts: Unknown Intentional Self Injurious Behavior: None (None noted) Family Suicide History: Unable to assess Recent stressful life event(s):  (UTA) Persecutory  voices/beliefs?:  Rich Reining(UTA) Depression:  (UTA) Depression Symptoms:  (UTA) Substance abuse history and/or treatment for substance abuse?: No Suicide prevention information given to non-admitted patients: Not applicable  Risk to Others within the past 6 months Homicidal Ideation: No Does patient have any lifetime risk of violence toward others beyond the six months prior to admission? : Unknown Thoughts of Harm to Others: No Current Homicidal Intent: No Current Homicidal Plan: No Access to Homicidal Means: No History of harm to others?: No Assessment of Violence: In past 6-12 months Violent Behavior Description: aggressive bx during previous admission Does patient have access to weapons?:  (UTA) Criminal Charges Pending?:  (UTA) Does patient have a court date:  (UTA) Is patient on probation?:  (UTA)  Psychosis Hallucinations: Auditory (Possible Visual ) Delusions: Grandiose  Mental Status Report Appearance/Hygiene: Disheveled Eye Contact: Poor Motor Activity: Freedom of movement Speech: Slurred Level of Consciousness: Alert Mood: Suspicious, Preoccupied Affect: Preoccupied Anxiety Level: None Thought Processes: Irrelevant, Tangential Judgement: Impaired Orientation: Time, Place, Person Obsessive Compulsive Thoughts/Behaviors: None  Cognitive Functioning Concentration: Poor Memory: Recent Impaired, Remote Impaired IQ: Average Insight: Poor Impulse Control: Poor Appetite:  (UTA) Weight Loss:  (UTA) Weight Gain:  (UTA) Sleep: Unable to Assess Total Hours of Sleep:  (UTA) Vegetative Symptoms: Unable to Assess  ADLScreening [facility] Assessment Services) Patient's cognitive ability adequate to safely complete daily activities?: Yes Patient able to express need for assistance with ADLs?: Yes Independently performs ADLs?: Yes (appropriate for developmental age)  Prior Inpatient Therapy Prior Inpatient Therapy: Yes Prior Therapy Dates: 09/24/2015 Prior Therapy  Facilty[facility]Provider[facility] Reason for Treatment: Psychosis  Prior Outpatient Therapy Prior Outpatient Therapy:  (UTA) Prior Therapy Dates: UTA Prior Therapy Facilty/Provider(s): UTA Reason for Treatment: UTA Does patient have an ACCT team?: No Does patient have Intensive In-House Services?  : No Does patient have [facility] services? : No Does patient have P4CC services?: No  ADL Screening (condition at time of admission) Patient's cognitive ability adequate to safely complete daily activities?: Yes Patient able to express need for assistance with ADLs?: Yes Independently performs ADLs?: Yes (appropriate for developmental age)       Abuse/Neglect Assessment (Assessment to be complete while patient is alone) Physical Abuse:  (UTA) Verbal Abuse:  (UTA) Sexual Abuse:  (UTA) Exploitation of patient/patient's resources:  (UTA) Self-Neglect:  (UTA) Values / Beliefs Cultural Requests During Hospitalization:  (UTA) Spiritual Requests During Hospitalization:  (UTA) Consults Spiritual Care Consult Needed:  (UTA) Social Work Consult Needed:  Industrial/product designer(UTA) Merchant navy officerAdvance Directives (For Healthcare) Does patient have an advance directive?: No Would patient like information on creating an advanced directive?: No - patient declined information    Additional Information 1:1 In Past 12 Months?: No CIRT Risk: No Elopement Risk: No Does patient have medical clearance?: Yes     Disposition:  Disposition Initial Assessment Completed for this Encounter: Yes Disposition of Patient: Referred to (Consult to Psych MD )  On Site Evaluation by:   Reviewed with Physician:    Asa SaunasShawanna N Nyaisha Simao 11/21/2015 4:55 PM

## 2015-11-21 NOTE — ED Notes (Signed)
BEHAVIORAL HEALTH ROUNDING Patient sleeping: No. Patient alert and oriented: yes Behavior appropriate: Yes.  ; If no, describe:  Nutrition and fluids offered: yes Toileting and hygiene offered: Yes  Sitter present: q15 minute observations and security  monitoring Law enforcement present: Yes  ODS  

## 2015-11-21 NOTE — ED Notes (Signed)
Maren ReamerWanda Phillips called for patient.  She states patient has [address] [facility] for over a week.  Stated he has been a model patient.  States he does take his medication "they crush his medications and put in his food".  She stated that she can be called and can be returned to facility.  [phone] cell [phone] home. States patient had signed out in morning to go for a walk.

## 2015-11-21 NOTE — ED Notes (Signed)
Pt refusing blood test at present.

## 2015-11-21 NOTE — ED Notes (Signed)
Pt states he was sent from group home to be evaluated.. States "my spirit is broken, its not health".. States the spirit talks to him but it is not good..Marland Kitchen

## 2015-11-21 NOTE — ED Provider Notes (Addendum)
[facility] Emergency Department Provider Note   ____________________________________________  Time seen: Approximately 1:23 PM  I have reviewed the triage vital signs and the nursing notes.   HISTORY  Chief Complaint Psychiatric Evaluation    HPI [name] is a 60 y.o. male has history of schizophrenia and is stating that he needs to find himself and his religion. Patient is very bizarre and with a lot of flight of ideas. Patient denies any suicidal or homicidal ideations. Patient refuses bloodwork on arrival to the ER. Patient has had history of same in the past. Patient is currently taking lithium.   Past Medical History  Diagnosis Date  . Schizophrenia (HCC)   . Diabetes mellitus without complication (HCC)   . Thyroid disease   . Hyperlipemia   . Sickle cell trait (HCC)   . HTN (hypertension)   . TIA (transient ischemic attack) in 2009  . Seizure (HCC) While toxic on Depakote  . Lithium toxicity   . Neutropenia (HCC)   . Hyperammonemia (HCC) While on Depakote    Patient Active Problem List   Diagnosis Date Noted  . Tardive dyskinesia 11/21/2015  . Noncompliance 11/21/2015  . HTN (hypertension) 10/10/2015  . Dyslipidemia 09/25/2015  . Undifferentiated schizophrenia (HCC)   . Diabetes (HCC) 09/16/2015  . Hypothyroid 09/16/2015    History reviewed. No pertinent past surgical history.  Current Outpatient Rx  Name  Route  Sig  Dispense  Refill  . amantadine (SYMMETREL) 100 MG capsule   Oral   Take 1 capsule (100 mg total) by mouth 2 (two) times daily with a meal.   60 capsule   0     This patient has a history of cheeking medications ...   . aspirin 81 MG chewable tablet   Oral   Chew 1 tablet (81 mg total) by mouth daily with breakfast.   30 tablet   0     This patient has a history of cheeking medications ...   . clonazePAM (KLONOPIN) 0.5 MG tablet   Oral   Take 1 tablet (0.5 mg total) by mouth 2 (two) times daily  with a meal.   60 tablet   0     This patient has a history of cheeking medications ...   . haloperidol (HALDOL) 10 MG tablet   Oral   Take 1 tablet (10 mg total) by mouth 2 (two) times daily.   60 tablet   0     This patient has a history of cheeking medications ...   . levothyroxine (SYNTHROID, LEVOTHROID) 125 MCG tablet   Oral   Take 1 tablet (125 mcg total) by mouth daily before breakfast.   30 tablet   0   . lisinopril (PRINIVIL,ZESTRIL) 2.5 MG tablet   Oral   Take 1 tablet (2.5 mg total) by mouth daily.   30 tablet   0     This patient has a history of cheeking medications ...   . metFORMIN (GLUCOPHAGE) 1000 MG tablet   Oral   Take 1 tablet (1,000 mg total) by mouth 2 (two) times daily with a meal.   60 tablet   0     This patient has a history of cheeking medications ...   . OLANZapine (ZYPREXA) 20 MG tablet   Oral   Take 1 tablet (20 mg total) by mouth 2 (two) times daily with a meal.   60 tablet   0     This patient has a  history of cheeking medications ...   . simvastatin (ZOCOR) 20 MG tablet   Oral   Take 1 tablet (20 mg total) by mouth daily at 6 PM.   30 tablet   0     This patient has a history of cheeking medications ...     Allergies Depakote and Lithium  No family history on file.  Social History Social History  Substance Use Topics  . Smoking status: Unknown If Ever Smoked  . Smokeless tobacco: None  . Alcohol Use: No    Review of Systems Constitutional: No fever/chills Eyes: No visual changes. ENT: No sore throat. Cardiovascular: Denies chest pain. Respiratory: Denies shortness of breath. Gastrointestinal: No abdominal pain.  No nausea, no vomiting.  No diarrhea.  No constipation. Genitourinary: Negative for dysuria. Musculoskeletal: Negative for back pain. Skin: Negative for rash. Neurological: Negative for headaches, focal weakness or numbness. Psychological: Patient states he needs to find his religion, and this with  a lot of flight of ideas and hallucinations. Patient is with very bizarre behavior. 10-point ROS otherwise negative.  ____________________________________________   PHYSICAL EXAM:  VITAL SIGNS: ED Triage Vitals  Enc Vitals Group     BP 11/21/15 1132 98/75 mmHg     Pulse Rate 11/21/15 1132 112     Resp 11/21/15 1132 20     Temp 11/21/15 1132 98.2 F (36.8 C)     Temp Source 11/21/15 1132 Oral     SpO2 11/21/15 1132 96 %     Weight 11/21/15 1132 170 lb (77.111 kg)     Height 11/21/15 1132 6\' 2"  (1.88 m)     Head Cir --      Peak Flow --      Pain Score --      Pain Loc --      Pain Edu? --      Excl. in GC? --     Constitutional: Alert and oriented. Well appearing and in no acute distress. Eyes: Conjunctivae are normal. PERRL. EOMI. Head: Atraumatic. Nose: No congestion/rhinnorhea. Mouth/Throat: Mucous membranes are moist.  Oropharynx non-erythematous. Neck: No stridor.   Cardiovascular: Normal rate, regular rhythm. Grossly normal heart sounds.  Good peripheral circulation. Respiratory: Normal respiratory effort.  No retractions. Lungs CTAB. Gastrointestinal: Soft and nontender. No distention. No abdominal bruits. No CVA tenderness. Musculoskeletal: No lower extremity tenderness nor edema.  No joint effusions. Neurologic:  Normal speech and language. No gross focal neurologic deficits are appreciated. No gait instability. Skin:  Skin is warm, dry and intact. No rash noted. Psychiatric: Mood and affect are bizarre. Speech and behavior are erratic and with flight of ideas.  ____________________________________________   LABS (all labs ordered are listed, but only abnormal results are displayed)  Labs Reviewed  COMPREHENSIVE METABOLIC PANEL - Abnormal; Notable for the following:    Calcium 8.6 (*)    ALT 12 (*)    All other components within normal limits  ACETAMINOPHEN LEVEL - Abnormal; Notable for the following:    Acetaminophen (Tylenol), Serum <10 (*)    All other  components within normal limits  CBC - Abnormal; Notable for the following:    Hemoglobin 11.9 (*)    HCT 36.7 (*)    MCV 79.2 (*)    MCH 25.7 (*)    All other components within normal limits  ETHANOL  SALICYLATE LEVEL  URINE DRUG SCREEN, QUALITATIVE ([facility] ONLY)   ____________________________________________  EKG  None ____________________________________________  RADIOLOGY  None ____________________________________________   PROCEDURES  Procedure(s)  performed: None  Critical Care performed: No  ____________________________________________   INITIAL IMPRESSION / ASSESSMENT AND PLAN / ED COURSE  Pertinent labs & imaging results that were available during my care of the patient were reviewed by me and considered in my medical decision making (see chart for details).  3:21 PM Patient is just awaiting evaluation by behavioral health and routine labs. He initially refused all of his labs. Dr. Delaney Meigs is decided the patient needs to stay in the behavioral health area until further possibility of transfer [facility] and get stabilized on his medications. ____________________________________________   FINAL CLINICAL IMPRESSION(S) / ED DIAGNOSES  Final diagnoses:  Acute psychosis  Schizophrenia, unspecified type (HCC)      NEW MEDICATIONS STARTED DURING THIS VISIT:  New Prescriptions   No medications on file     Note:  This document was prepared using Dragon voice recognition software and may include unintentional dictation errors.    Leona Carry, MD 11/21/15 1521  Leona Carry, MD 11/21/15 [phone]

## 2015-11-21 NOTE — Progress Notes (Signed)
Representative from the Pt ACT Team has contacted Clinical research associateWriter. [address]le [place] [employer]( [facility] [place] [phone]). She has updated the Clinical research associatewriter as to where the pt is currently residing, [facility] at [facility] [address] This residence is new to the pt as of this past Sunday. She reports that the facility states that the pt hasn't been completing his ADL's, and continues to wonder off without supervision. Pt was laying in the yard on today. Representative states that a neighbor called the police when they noticed the pt laying out on the ground.   11/21/2015 Cheryl FlashNicole Stevie Charter, MS, NCC, LPCA Therapeutic Triage Specialist

## 2015-11-21 NOTE — Consult Note (Signed)
[facility] Consult   Reason for Consult:  Consult for 60 year old man with history of schizophrenia brought in voluntarily from his group home Referring Physician:  Lovena Le Patient Identification: Corky Blumstein MRN:  [id number] Principal Diagnosis: Undifferentiated schizophrenia [facility]) Diagnosis:   Patient Active Problem List   Diagnosis Date Noted  . Tardive dyskinesia [G24.01] 11/21/2015  . Noncompliance [Z91.19] 11/21/2015  . HTN (hypertension) [I10] 10/10/2015  . Dyslipidemia [E78.5] 09/25/2015  . Undifferentiated schizophrenia ([place]) [F20.3]   . Diabetes ([place]) [E11.9] 09/16/2015  . Hypothyroid [E03.9] 09/16/2015    Total Time spent with patient: 45 minutes  Subjective:   Maliik Karner is a 60 y.o. male patient admitted with "all are under God".  HPI:  Patient interviewed the best I could. Chart reviewed. Labs vitals reviewed. Prior medications on last discharge reviewed. Patient has refused to allow any labs to be drawn so far so we have limited information. He did allow his vital signs to be taken which shows blood pressure to be low which suggests to me that he is probably dehydrated again. Patient didn't give me any very useful information. He told me that he was being led by the spirit and that his own spirit was troubled. He described himself as being "a begger on the roadside". I ask if that meant that he had actually left the group home and was walking on the road and he did not give me a coherent answer. He said he hasn't been taking any of his medication for about a week including all of his medical medication. As usual he has no particular reason for that. He claims that he's been eating and drinking normally but says he hasn't been sleeping well. At this point I don't have anything collateral information from the group home. He was here in the hospital fairly recently and was discharged after a somewhat lengthy hospital stay back to his group home. It is his usual  pattern that eventually, usually sooner rather than later, he will stop taking his medicine and will decompensate to an even worse baseline than usual usually with stopping eating and drinking as well. I suspect this probably what is happening this time too.  Social history: Patient has a legal guardian. Family are not really involved. He resides in a group home. He has a long-standing essentially lifelong history of chronic mental illness.  Medical history: History of diabetes hypertension and hypothyroidism tardive dyskinesia. Patient becomes noncompliant with his diabetes medicine and his other prescribed medicines when he gets psychotic. This can result in more erratic changes in his blood sugars since he also stops eating.  Substance abuse history: Patient does not drink or abuse drugs and does not have a significant problem with that  Past Psychiatric History: Long-standing schizophrenia undifferentiated type. Relatively resistant to medications. Long stretches of refusal of medicine. His psychosis tends to be extremely hyper religious but also involve stopping eating and stopping any self-care. Typically when he is off his medicine he will. Will refused to wash and will refused to change his clothing as well as refusing to allow any labs to be drawn or to take any of his medicine. Usually he stops eating and drinking until he becomes dehydrated with erratic blood sugars. Great number of admissions to the state psychiatric hospital in the past for similar patterns of behavior. Large number of hospitalizations lifelong. He was just in our hospital may be a little over a month ago.  Risk to Self: Is patient at risk for  suicide?: No Risk to Others:   Prior Inpatient Therapy:   Prior Outpatient Therapy:    Past Medical History:  Past Medical History  Diagnosis Date  . Schizophrenia ([place])   . Diabetes mellitus without complication ([place])   . Thyroid disease   . Hyperlipemia   . Sickle cell  trait ([place])   . HTN (hypertension)   . TIA (transient ischemic attack) in 2009  . Seizure ([place]) While toxic on Depakote  . Lithium toxicity   . Neutropenia ([place])   . Hyperammonemia ([facility]) While on Depakote   History reviewed. No pertinent past surgical history. Family History: No family history on file. Family Psychiatric  History: Unknown. He can't answer this. Social History:  History  Alcohol Use No     History  Drug Use Not on file    Social History   Social History  . Marital Status: Single    Spouse Name: N/A  . Number of Children: N/A  . Years of Education: N/A   Social History Main Topics  . Smoking status: Unknown If Ever Smoked  . Smokeless tobacco: None  . Alcohol Use: No  . Drug Use: None  . Sexual Activity: Not Asked   Other Topics Concern  . None   Social History Narrative   Additional Social History:    Allergies:   Allergies  Allergen Reactions  . Depakote [Valproic Acid]     hyperammonemia  . Lithium     Prior history of lithium toxicity due to frequent refusal to eat or drink    Labs:  Results for orders placed or performed during the hospital encounter of 11/21/15 (from the past 48 hour(s))  Comprehensive metabolic panel     Status: Abnormal   Collection Time: 11/21/15 12:52 PM  Result Value Ref Range   Sodium 142 135 - 145 mmol/L   Potassium 3.8 3.5 - 5.1 mmol/L   Chloride 109 101 - 111 mmol/L   CO2 26 22 - 32 mmol/L   Glucose, Bld 98 65 - 99 mg/dL   BUN 16 6 - 20 mg/dL   Creatinine, Ser 1.12 0.61 - 1.24 mg/dL   Calcium 8.6 (L) 8.9 - 10.3 mg/dL   Total Protein 6.8 6.5 - 8.1 g/dL   Albumin 4.0 3.5 - 5.0 g/dL   AST 17 15 - 41 U/L   ALT 12 (L) 17 - 63 U/L   Alkaline Phosphatase 54 38 - 126 U/L   Total Bilirubin 0.4 0.3 - 1.2 mg/dL   GFR calc non Af Amer >60 >60 mL/min   GFR calc Af Amer >60 >60 mL/min    Comment: (NOTE) The eGFR has been calculated using the CKD EPI equation. This calculation has not been validated in all  clinical situations. eGFR's persistently <60 mL/min signify possible Chronic Kidney Disease.    Anion gap 7 5 - 15  Ethanol     Status: None   Collection Time: 11/21/15 12:52 PM  Result Value Ref Range   Alcohol, Ethyl (B) <5 <5 mg/dL    Comment:        LOWEST DETECTABLE LIMIT FOR SERUM ALCOHOL IS 5 mg/dL FOR MEDICAL PURPOSES ONLY   Salicylate level     Status: None   Collection Time: 11/21/15 12:52 PM  Result Value Ref Range   Salicylate Lvl <1.1 2.8 - 30.0 mg/dL  Acetaminophen level     Status: Abnormal   Collection Time: 11/21/15 12:52 PM  Result Value Ref Range   Acetaminophen (Tylenol), Serum <  10 (L) 10 - 30 ug/mL    Comment:        THERAPEUTIC CONCENTRATIONS VARY SIGNIFICANTLY. A RANGE OF 10-30 ug/mL MAY BE AN EFFECTIVE CONCENTRATION FOR MANY PATIENTS. HOWEVER, SOME ARE BEST TREATED AT CONCENTRATIONS OUTSIDE THIS RANGE. ACETAMINOPHEN CONCENTRATIONS >150 ug/mL AT 4 HOURS AFTER INGESTION AND >50 ug/mL AT 12 HOURS AFTER INGESTION ARE OFTEN ASSOCIATED WITH TOXIC REACTIONS.   cbc     Status: Abnormal   Collection Time: 11/21/15 12:52 PM  Result Value Ref Range   WBC 5.2 3.8 - 10.6 K/uL   RBC 4.63 4.40 - 5.90 MIL/uL   Hemoglobin 11.9 (L) 13.0 - 18.0 g/dL   HCT 36.7 (L) 40.0 - 52.0 %   MCV 79.2 (L) 80.0 - 100.0 fL   MCH 25.7 (L) 26.0 - 34.0 pg   MCHC 32.4 32.0 - 36.0 g/dL   RDW 14.2 11.5 - 14.5 %   Platelets 190 150 - 440 K/uL    Current Facility-Administered Medications  Medication Dose Route Frequency Provider Last Rate Last Dose  . amantadine (SYMMETREL) capsule 100 mg  100 mg Oral BID Gonzella Lex, MD      . aspirin EC tablet 81 mg  81 mg Oral Daily Gonzella Lex, MD      . clonazePAM (KLONOPIN) tablet 0.5 mg  0.5 mg Oral BID Gonzella Lex, MD      . haloperidol (HALDOL) tablet 10 mg  10 mg Oral BID Gonzella Lex, MD      . Derrill Memo ON 11/22/2015] levothyroxine (SYNTHROID, LEVOTHROID) tablet 125 mcg  125 mcg Oral QAC breakfast Gonzella Lex, MD      .  lisinopril (PRINIVIL,ZESTRIL) tablet 20 mg  20 mg Oral Daily Gonzella Lex, MD      . OLANZapine zydis (ZYPREXA) disintegrating tablet 20 mg  20 mg Oral QHS Gonzella Lex, MD      . simvastatin (ZOCOR) tablet 10 mg  10 mg Oral q1800 Gonzella Lex, MD       Current Outpatient Prescriptions  Medication Sig Dispense Refill  . amantadine (SYMMETREL) 100 MG capsule Take 1 capsule (100 mg total) by mouth 2 (two) times daily with a meal. 60 capsule 0  . aspirin 81 MG chewable tablet Chew 1 tablet (81 mg total) by mouth daily with breakfast. 30 tablet 0  . clonazePAM (KLONOPIN) 0.5 MG tablet Take 1 tablet (0.5 mg total) by mouth 2 (two) times daily with a meal. 60 tablet 0  . haloperidol (HALDOL) 10 MG tablet Take 1 tablet (10 mg total) by mouth 2 (two) times daily. 60 tablet 0  . levothyroxine (SYNTHROID, LEVOTHROID) 125 MCG tablet Take 1 tablet (125 mcg total) by mouth daily before breakfast. 30 tablet 0  . lisinopril (PRINIVIL,ZESTRIL) 2.5 MG tablet Take 1 tablet (2.5 mg total) by mouth daily. 30 tablet 0  . metFORMIN (GLUCOPHAGE) 1000 MG tablet Take 1 tablet (1,000 mg total) by mouth 2 (two) times daily with a meal. 60 tablet 0  . OLANZapine (ZYPREXA) 20 MG tablet Take 1 tablet (20 mg total) by mouth 2 (two) times daily with a meal. 60 tablet 0  . simvastatin (ZOCOR) 20 MG tablet Take 1 tablet (20 mg total) by mouth daily at 6 PM. 30 tablet 0    Musculoskeletal: Strength & Muscle Tone: within normal limits Gait & Station: normal Patient leans: N/A  Psychiatric Specialty Exam: Review of Systems  Unable to perform ROS: psychiatric disorder    Blood  pressure 98/75, pulse 112, temperature 98.2 F (36.8 C), temperature source Oral, resp. rate 20, height '6\' 2"'  (1.88 m), weight 77.111 kg (170 lb), SpO2 96 %.Body mass index is 21.82 kg/(m^2).  General Appearance: Disheveled  Eye Contact::  Minimal  Speech:  Garbled, Slow and Slurred  Volume:  Decreased  Mood:  Euthymic  Affect:  Constricted,  Flat and Inappropriate  Thought Process:  Disorganized and Hyper religious. Basically every single thing he says is a religious reference rather than an answer to any question  Orientation:  Negative  Thought Content:  Delusions and Obsessions  Suicidal Thoughts:  No  Homicidal Thoughts:  No  Memory:  Immediate;   Poor Recent;   Poor Remote;   Poor  Judgement:  Poor  Insight:  Lacking  Psychomotor Activity:  Decreased  Concentration:  Poor  Recall:  Poor  Fund of Knowledge:Poor  Language: Poor  Akathisia:  No  Handed:  Right  AIMS (if indicated):     Assets:  Financial Resources/Insurance Housing  ADL's:  Impaired  Cognition: Impaired,  Mild  Sleep:      Treatment Plan Summary: Daily contact with patient to assess and evaluate symptoms and progress in treatment, Medication management and Plan See note below note below  Disposition: Daily contact with patient to assess and evaluate symptoms and progress in treatment, Medication management and Plan It has been our policy in the past to not admit him to the psychiatric ward when he is still noncompliant with medications. This is in part because he has required so many admissions to the state hospital over the year. At this point I think we will go ahead and keep him in the behavioral health unit area while ordering his medicine and we will refer him to the state psychiatric hospital in [place]. If he becomes medicine compliant we can reconsider admission as we did last time. Patient will continue on the same medicines he was on when he was discharged last time which is Zyprexa disintegrating and Haldol for anti-psychosis as well as his multiple medications for his diabetes dyslipidemia and hypertension. We will continue to try and get his blood draw done. Last time we confronted this it ended up taking several people restraining him to force the blood draws. For that for now until it becomes more of an urgent matter.  Alethia Berthold,  MD 11/21/2015 2:49 PM

## 2015-11-21 NOTE — ED Notes (Signed)

## 2015-11-22 DIAGNOSIS — F209 Schizophrenia, unspecified: Secondary | ICD-10-CM | POA: Diagnosis not present

## 2015-11-22 DIAGNOSIS — F203 Undifferentiated schizophrenia: Secondary | ICD-10-CM | POA: Diagnosis not present

## 2015-11-22 LAB — URINE DRUG SCREEN, QUALITATIVE (ARMC ONLY)
Amphetamines, Ur Screen: NOT DETECTED
BARBITURATES, UR SCREEN: NOT DETECTED
Benzodiazepine, Ur Scrn: NOT DETECTED
CANNABINOID 50 NG, UR ~~LOC~~: NOT DETECTED
COCAINE METABOLITE, UR ~~LOC~~: NOT DETECTED
MDMA (ECSTASY) UR SCREEN: NOT DETECTED
Methadone Scn, Ur: NOT DETECTED
OPIATE, UR SCREEN: NOT DETECTED
PHENCYCLIDINE (PCP) UR S: NOT DETECTED
Tricyclic, Ur Screen: NOT DETECTED

## 2015-11-22 NOTE — ED Notes (Signed)
Pt up using restroom without assistance at this time.  

## 2015-11-22 NOTE — ED Notes (Signed)
Pt given meal tray at this time 

## 2015-11-22 NOTE — ED Notes (Signed)
pts behavior has been appropriate and cooperative in no distress, ate dinner

## 2015-11-22 NOTE — ED Notes (Signed)
Report called to Marijean NiemannKareena, RN at this time.

## 2015-11-22 NOTE — ED Notes (Signed)
Pt noted to be standing in the center of the room staring at the TV. Pt alert and appropriate with staff at this time. NAD noted. Will continue to monitor with safety checks to re-evaluate for patient needs at this time.

## 2015-11-22 NOTE — ED Notes (Signed)
Pt again standing at doorway asking this tech "what time is it" pt provided time of day and redirected back to his bed.

## 2015-11-22 NOTE — Consult Note (Signed)
[place] [facility] Consult   Reason for Consult:  Consult for 60 year old man with history of schizophrenia brought in voluntarily from his group home Referring Physician:  Lovena Le Patient Identification: [name] MRN:  [id number] Principal Diagnosis: Undifferentiated schizophrenia [facility]) Diagnosis:   Patient Active Problem List   Diagnosis Date Noted  . Tardive dyskinesia [G24.01] 11/21/2015  . Noncompliance [Z91.19] 11/21/2015  . HTN (hypertension) [I10] 10/10/2015  . Dyslipidemia [E78.5] 09/25/2015  . Undifferentiated schizophrenia ([place]) [F20.3]   . Diabetes ([place]) [E11.9] 09/16/2015  . Hypothyroid [E03.9] 09/16/2015    Total Time spent with patient: 20 minutes  Subjective:   [name] is a 60 y.o. male patient admitted with "all are under God".  Follow-up note for Wednesday the 17th. Patient is still hyper religious and somewhat disorganized but has not been aggressive. He is eating and drinking some and has taken his medicine this morning although he refused them yesterday.  HPI:  Patient interviewed the best I could. Chart reviewed. Labs vitals reviewed. Prior medications on last discharge reviewed. Patient has refused to allow any labs to be drawn so far so we have limited information. He did allow his vital signs to be taken which shows blood pressure to be low which suggests to me that he is probably dehydrated again. Patient didn't give me any very useful information. He told me that he was being led by the spirit and that his own spirit was troubled. He described himself as being "a begger on the roadside". I ask if that meant that he had actually left the group home and was walking on the road and he did not give me a coherent answer. He said he hasn't been taking any of his medication for about a week including all of his medical medication. As usual he has no particular reason for that. He claims that he's been eating and drinking normally but says he hasn't been  sleeping well. At this point I don't have anything collateral information from the group home. He was here in the hospital fairly recently and was discharged after a somewhat lengthy hospital stay back to his group home. It is his usual pattern that eventually, usually sooner rather than later, he will stop taking his medicine and will decompensate to an even worse baseline than usual usually with stopping eating and drinking as well. I suspect this probably what is happening this time too.  Social history: Patient has a legal guardian. Family are not really involved. He resides in a group home. He has a long-standing essentially lifelong history of chronic mental illness.  Medical history: History of diabetes hypertension and hypothyroidism tardive dyskinesia. Patient becomes noncompliant with his diabetes medicine and his other prescribed medicines when he gets psychotic. This can result in more erratic changes in his blood sugars since he also stops eating.  Substance abuse history: Patient does not drink or abuse drugs and does not have a significant problem with that  Past Psychiatric History: Long-standing schizophrenia undifferentiated type. Relatively resistant to medications. Long stretches of refusal of medicine. His psychosis tends to be extremely hyper religious but also involve stopping eating and stopping any self-care. Typically when he is off his medicine he will. Will refused to wash and will refused to change his clothing as well as refusing to allow any labs to be drawn or to take any of his medicine. Usually he stops eating and drinking until he becomes dehydrated with erratic blood sugars. Great number of admissions to the state  psychiatric hospital in the past for similar patterns of behavior. Large number of hospitalizations lifelong. He was just in our hospital may be a little over a month ago.  Risk to Self: Suicidal Ideation: No Suicidal Intent: No Is patient at risk for  suicide?: No Suicidal Plan?: No Access to Means: No What has been your use of drugs/alcohol within the last 12 months?: None Noted How many times?:  (UTA) Other Self Harm Risks:  (UTA) Triggers for Past Attempts: Unknown Intentional Self Injurious Behavior: None (None noted) Risk to Others: Homicidal Ideation: No Thoughts of Harm to Others: No Current Homicidal Intent: No Current Homicidal Plan: No Access to Homicidal Means: No History of harm to others?: No Assessment of Violence: In past 6-12 months Violent Behavior Description: aggressive bx during previous admission Does patient have access to weapons?:  (UTA) Criminal Charges Pending?:  (UTA) Does patient have a court date:  (UTA) Prior Inpatient Therapy: Prior Inpatient Therapy: Yes Prior Therapy Dates: 09/24/2015 Prior Therapy Facilty/Provider(s): [facility] Reason for Treatment: Psychosis Prior Outpatient Therapy: Prior Outpatient Therapy:  (UTA) Prior Therapy Dates: UTA Prior Therapy Facilty/Provider(s): UTA Reason for Treatment: UTA Does patient have an ACCT team?: No Does patient have Intensive In-House Services?  : No Does patient have [facility] services? : No Does patient have P4CC services?: No  Past Medical History:  Past Medical History  Diagnosis Date  . Schizophrenia ([place])   . Diabetes mellitus without complication ([place])   . Thyroid disease   . Hyperlipemia   . Sickle cell trait ([place])   . HTN (hypertension)   . TIA (transient ischemic attack) in 2009  . Seizure ([place]) While toxic on Depakote  . Lithium toxicity   . Neutropenia ([place])   . Hyperammonemia ([facility]) While on Depakote   History reviewed. No pertinent past surgical history. Family History: No family history on file. Family Psychiatric  History: Unknown. He can't answer this. Social History:  History  Alcohol Use No     History  Drug Use Not on file    Social History   Social History  . Marital Status: Single    Spouse Name: N/A  . Number of  Children: N/A  . Years of Education: N/A   Social History Main Topics  . Smoking status: Unknown If Ever Smoked  . Smokeless tobacco: None  . Alcohol Use: No  . Drug Use: None  . Sexual Activity: Not Asked   Other Topics Concern  . None   Social History Narrative   Additional Social History:    Allergies:   Allergies  Allergen Reactions  . Depakote [Valproic Acid] Other (See Comments)    Reaction:  Hyperammonemia   . Lithium Other (See Comments)    Pt has a prior history of Lithium toxicity.       Labs:  Results for orders placed or performed during the hospital encounter of 11/21/15 (from the past 48 hour(s))  Comprehensive metabolic panel     Status: Abnormal   Collection Time: 11/21/15 12:52 PM  Result Value Ref Range   Sodium 142 135 - 145 mmol/L   Potassium 3.8 3.5 - 5.1 mmol/L   Chloride 109 101 - 111 mmol/L   CO2 26 22 - 32 mmol/L   Glucose, Bld 98 65 - 99 mg/dL   BUN 16 6 - 20 mg/dL   Creatinine, Ser 1.12 0.61 - 1.24 mg/dL   Calcium 8.6 (L) 8.9 - 10.3 mg/dL   Total Protein 6.8 6.5 - 8.1 g/dL  Albumin 4.0 3.5 - 5.0 g/dL   AST 17 15 - 41 U/L   ALT 12 (L) 17 - 63 U/L   Alkaline Phosphatase 54 38 - 126 U/L   Total Bilirubin 0.4 0.3 - 1.2 mg/dL   GFR calc non Af Amer >60 >60 mL/min   GFR calc Af Amer >60 >60 mL/min    Comment: (NOTE) The eGFR has been calculated using the CKD EPI equation. This calculation has not been validated in all clinical situations. eGFR's persistently <60 mL/min signify possible Chronic Kidney Disease.    Anion gap 7 5 - 15  Ethanol     Status: None   Collection Time: 11/21/15 12:52 PM  Result Value Ref Range   Alcohol, Ethyl (B) <5 <5 mg/dL    Comment:        LOWEST DETECTABLE LIMIT FOR SERUM ALCOHOL IS 5 mg/dL FOR MEDICAL PURPOSES ONLY   Salicylate level     Status: None   Collection Time: 11/21/15 12:52 PM  Result Value Ref Range   Salicylate Lvl <4.4 2.8 - 30.0 mg/dL  Acetaminophen level     Status: Abnormal    Collection Time: 11/21/15 12:52 PM  Result Value Ref Range   Acetaminophen (Tylenol), Serum <10 (L) 10 - 30 ug/mL    Comment:        THERAPEUTIC CONCENTRATIONS VARY SIGNIFICANTLY. A RANGE OF 10-30 ug/mL MAY BE AN EFFECTIVE CONCENTRATION FOR MANY PATIENTS. HOWEVER, SOME ARE BEST TREATED AT CONCENTRATIONS OUTSIDE THIS RANGE. ACETAMINOPHEN CONCENTRATIONS >150 ug/mL AT 4 HOURS AFTER INGESTION AND >50 ug/mL AT 12 HOURS AFTER INGESTION ARE OFTEN ASSOCIATED WITH TOXIC REACTIONS.   cbc     Status: Abnormal   Collection Time: 11/21/15 12:52 PM  Result Value Ref Range   WBC 5.2 3.8 - 10.6 K/uL   RBC 4.63 4.40 - 5.90 MIL/uL   Hemoglobin 11.9 (L) 13.0 - 18.0 g/dL   HCT 36.7 (L) 40.0 - 52.0 %   MCV 79.2 (L) 80.0 - 100.0 fL   MCH 25.7 (L) 26.0 - 34.0 pg   MCHC 32.4 32.0 - 36.0 g/dL   RDW 14.2 11.5 - 14.5 %   Platelets 190 150 - 440 K/uL  Urine Drug Screen, Qualitative     Status: None   Collection Time: 11/21/15 11:50 PM  Result Value Ref Range   Tricyclic, Ur Screen NONE DETECTED NONE DETECTED   Amphetamines, Ur Screen NONE DETECTED NONE DETECTED   MDMA (Ecstasy)Ur Screen NONE DETECTED NONE DETECTED   Cocaine Metabolite,Ur [place] NONE DETECTED NONE DETECTED   Opiate, Ur Screen NONE DETECTED NONE DETECTED   Phencyclidine (PCP) Ur S NONE DETECTED NONE DETECTED   Cannabinoid 50 Ng, Ur [place] NONE DETECTED NONE DETECTED   Barbiturates, Ur Screen NONE DETECTED NONE DETECTED   Benzodiazepine, Ur Scrn NONE DETECTED NONE DETECTED   Methadone Scn, Ur NONE DETECTED NONE DETECTED    Comment: (NOTE) 315  Tricyclics, urine               Cutoff 1000 ng/mL 200  Amphetamines, urine             Cutoff 1000 ng/mL 300  MDMA (Ecstasy), urine           Cutoff 500 ng/mL 400  Cocaine Metabolite, urine       Cutoff 300 ng/mL 500  Opiate, urine                   Cutoff 300 ng/mL 600  Phencyclidine (PCP), urine  Cutoff 25 ng/mL 700  Cannabinoid, urine              Cutoff 50 ng/mL 800  Barbiturates, urine              Cutoff 200 ng/mL 900  Benzodiazepine, urine           Cutoff 200 ng/mL 1000 Methadone, urine                Cutoff 300 ng/mL 1100 1200 The urine drug screen provides only a preliminary, unconfirmed 1300 analytical test result and should not be used for non-medical 1400 purposes. Clinical consideration and professional judgment should 1500 be applied to any positive drug screen result due to possible 1600 interfering substances. A more specific alternate chemical method 1700 must be used in order to obtain a confirmed analytical result.  1800 Gas chromato graphy / mass spectrometry (GC/MS) is the preferred 1900 confirmatory method.     Current Facility-Administered Medications  Medication Dose Route Frequency Provider Last Rate Last Dose  . amantadine (SYMMETREL) capsule 100 mg  100 mg Oral BID Gonzella Lex, MD   100 mg at 11/22/15 0940  . aspirin EC tablet 81 mg  81 mg Oral Daily Gonzella Lex, MD   81 mg at 11/22/15 0940  . clonazePAM (KLONOPIN) tablet 0.5 mg  0.5 mg Oral BID Gonzella Lex, MD   0.5 mg at 11/22/15 0940  . haloperidol (HALDOL) tablet 10 mg  10 mg Oral BID Gonzella Lex, MD   10 mg at 11/22/15 0941  . levothyroxine (SYNTHROID, LEVOTHROID) tablet 125 mcg  125 mcg Oral QAC breakfast Gonzella Lex, MD   125 mcg at 11/22/15 4403  . lisinopril (PRINIVIL,ZESTRIL) tablet 20 mg  20 mg Oral Daily Gonzella Lex, MD   20 mg at 11/22/15 0939  . OLANZapine zydis (ZYPREXA) disintegrating tablet 20 mg  20 mg Oral QHS Gonzella Lex, MD   20 mg at 11/21/15 2200  . simvastatin (ZOCOR) tablet 10 mg  10 mg Oral q1800 Gonzella Lex, MD       Current Outpatient Prescriptions  Medication Sig Dispense Refill  . acetaminophen (TYLENOL) 650 MG CR tablet Take 650 mg by mouth every 6 (six) hours as needed for pain.    Marland Kitchen alum & mag hydroxide-simeth (MAALOX/MYLANTA) 200-200-20 MG/5ML suspension Take 30 mLs by mouth every 4 (four) hours as needed for indigestion or heartburn.    Marland Kitchen  amantadine (SYMMETREL) 100 MG capsule Take 1 capsule (100 mg total) by mouth 2 (two) times daily with a meal. 60 capsule 0  . aspirin EC 81 MG tablet Take 81 mg by mouth daily.    . clonazePAM (KLONOPIN) 0.5 MG tablet Take 1 tablet (0.5 mg total) by mouth 2 (two) times daily with a meal. 60 tablet 0  . haloperidol (HALDOL) 10 MG tablet Take 1 tablet (10 mg total) by mouth 2 (two) times daily. 60 tablet 0  . levothyroxine (SYNTHROID, LEVOTHROID) 125 MCG tablet Take 1 tablet (125 mcg total) by mouth daily before breakfast. 30 tablet 0  . lisinopril (PRINIVIL,ZESTRIL) 2.5 MG tablet Take 1 tablet (2.5 mg total) by mouth daily. 30 tablet 0  . magnesium hydroxide (MILK OF MAGNESIA) 400 MG/5ML suspension Take 30 mLs by mouth daily as needed for mild constipation.    . metFORMIN (GLUCOPHAGE) 1000 MG tablet Take 1 tablet (1,000 mg total) by mouth 2 (two) times daily with a meal. 60 tablet 0  .  OLANZapine zydis (ZYPREXA) 20 MG disintegrating tablet Take 20 mg by mouth 2 (two) times daily.    . simvastatin (ZOCOR) 20 MG tablet Take 20 mg by mouth at bedtime.      Musculoskeletal: Strength & Muscle Tone: within normal limits Gait & Station: normal Patient leans: N/A  Psychiatric Specialty Exam: Review of Systems  Unable to perform ROS: psychiatric disorder    Blood pressure 150/94, pulse 82, temperature 97.8 F (36.6 C), temperature source Axillary, resp. rate 18, height '6\' 2"'  (1.88 m), weight 77.111 kg (170 lb), SpO2 100 %.Body mass index is 21.82 kg/(m^2).  General Appearance: Disheveled  Eye Contact::  Minimal  Speech:  Garbled, Slow and Slurred  Volume:  Decreased  Mood:  Euthymic  Affect:  Constricted, Flat and Inappropriate  Thought Process:  Disorganized and Hyper religious. Basically every single thing he says is a religious reference rather than an answer to any question  Orientation:  Negative  Thought Content:  Delusions and Obsessions  Suicidal Thoughts:  No  Homicidal Thoughts:  No   Memory:  Immediate;   Poor Recent;   Poor Remote;   Poor  Judgement:  Poor  Insight:  Lacking  Psychomotor Activity:  Decreased  Concentration:  Poor  Recall:  Poor  Fund of Knowledge:Poor  Language: Poor  Akathisia:  No  Handed:  Right  AIMS (if indicated):     Assets:  Financial Resources/Insurance Housing  ADL's:  Impaired  Cognition: Impaired,  Mild  Sleep:      Treatment Plan Summary: Daily contact with patient to assess and evaluate symptoms and progress in treatment, Medication management and Plan See note below note below  Disposition: Patient is looking a little more compliant taking his medicine starting to eat and take care of himself a little better. If he has taken his medicine tonight and is still eating we will strongly consider discharge tomorrow as this is a recurrent pattern for the patient and unlikely to change her benefit from further hospital treatment. No change to medicine  Alethia Berthold, MD 11/22/2015 5:14 PM

## 2015-11-22 NOTE — ED Notes (Signed)
Report was received from Ruth B., RN; Pt. Verbalizes no complaints or distress; denies S.I./Hi. Continue to monitor with 15 min. Monitoring. 

## 2015-11-22 NOTE — ED Provider Notes (Signed)
-----------------------------------------   7:17 AM on 11/22/2015 -----------------------------------------   Blood pressure 135/82, pulse 76, temperature 97.8 F (36.6 C), temperature source Axillary, resp. rate 18, height 6\' 2"  (1.88 m), weight 170 lb (77.111 kg), SpO2 100 %.  The patient had no acute events since last update.  Calm and cooperative at this time.    The patient needs to be admitted. At [facility]     Rebecka ApleyAllison P Webster, MD 11/22/15 [phone]

## 2015-11-22 NOTE — ED Notes (Signed)
Pt transferred from main ed, oriented to room and BHU he has no specific c/o at this time

## 2015-11-22 NOTE — ED Notes (Signed)
Pt at doorway asking this tech "what time is it"

## 2015-11-22 NOTE — ED Notes (Signed)
Pt noted to be sitting in bed at this time with paper and a crayon, writing on the paper. Pt alert and calm with staff at this time. Will continue to monitor with q15 min safety checks to evaluate patient needs at this time.

## 2015-11-22 NOTE — ED Notes (Signed)
Pt calm and cooperative with staff. Pt took medications without issue. Pt noted to be writing on a piece of paper with a crayon at this time.

## 2015-11-23 DIAGNOSIS — F209 Schizophrenia, unspecified: Secondary | ICD-10-CM | POA: Diagnosis not present

## 2015-11-23 NOTE — ED Notes (Signed)
Patient resting quietly in room. No noted distress or abnormal behaviors noted. Will continue 15 minute checks and observation by security camera for safety. 

## 2015-11-23 NOTE — ED Notes (Signed)
Pt compliant with medications. Pt stated only one phrase to this Clinical research associatewriter repeatedly. Writer could not make out what the pt was stating. Pt was calm. No distress noted. No concerns voiced. Maintained on 15 minute checks and observation by security camera for safety.

## 2015-11-23 NOTE — ED Notes (Signed)
Pt standing in room with blanket covering his head and shoulders. Pt appears to be praying. No distress noted. Maintained on 15 minute checks and observation by security camera for safety.

## 2015-11-23 NOTE — ED Notes (Signed)
Pt compliant with 10 am medications. Pt asked RN appropriate questions.  RN told pt the doctor would see him later this morning and pt asked "Dr Toni Amendlapacs?" Pt more oriented on this encounter. No distress noted. Maintained on 15 minute checks and observation by security camera for safety.

## 2015-11-23 NOTE — ED Notes (Signed)
Report was received from Amy B., RN; Pt. Verbalizes no complaints or distress; denies S.I./Hi. Continue to monitor with 15 min. Monitoring. 

## 2015-11-23 NOTE — ED Provider Notes (Signed)
-----------------------------------------   7:11 AM on 11/23/2015 -----------------------------------------   Blood pressure 150/94, pulse 82, temperature 97.8 F (36.6 C), temperature source Axillary, resp. rate 18, height 6\' 2"  (1.88 m), weight 170 lb (77.111 kg), SpO2 100 %.  The patient had no acute events since last update.  The patient has been seen and evaluated by psychiatry who is currently following along with the patient. They state the patient is more compliant, and improving. The plan is to reevaluate the patient today for consideration of discharge home. Psychiatry does not believe the patient would benefit from inpatient admission given his recurrent pattern of behavior.   Minna AntisKevin Daquan Crapps, MD 11/23/15 [phone]

## 2015-11-23 NOTE — ED Notes (Signed)
Pt often going to bathroom to fill cups with water. Pt remains cooperative. No distress noted. Maintained on 15 minute checks and observation by security camera for safety.

## 2015-11-24 DIAGNOSIS — F209 Schizophrenia, unspecified: Secondary | ICD-10-CM | POA: Diagnosis not present

## 2015-11-24 NOTE — ED Notes (Signed)
Patient refused vitals.

## 2015-11-24 NOTE — ED Notes (Signed)
Patient resting quietly in room. No noted distress or abnormal behaviors noted. Will continue 15 minute checks and observation by security camera for safety. 

## 2015-11-24 NOTE — ED Provider Notes (Signed)
-----------------------------------------   6:21 AM on 11/24/2015 -----------------------------------------   Blood pressure 143/66, pulse 74, temperature 98.5 F (36.9 C), temperature source Oral, resp. rate 18, height 6\' 2"  (1.88 m), weight 170 lb (77.111 kg), SpO2 100 %.  The patient had no acute events since last update.  Calm and cooperative at this time.  Disposition is pending per Psychiatry/Behavioral Medicine team recommendations.     Irean HongJade J Aloria Looper, MD 11/24/15 [phone]

## 2015-11-24 NOTE — ED Notes (Signed)
Patient denies SI/HI/AVH and pain. All belongings returned to patient. Discharge instructions reviewed. Patient picked up by group home representative.

## 2015-11-24 NOTE — ED Notes (Signed)
ENVIRONMENTAL ASSESSMENT Potentially harmful objects out of patient reach: Yes Personal belongings secured: Yes Patient dressed in hospital provided attire only: Yes Plastic bags out of patient reach: Yes Patient care equipment (cords, cables, call bells, lines, and drains) shortened, removed, or accounted for: Yes Equipment and supplies removed from bottom of stretcher: Yes Potentially toxic materials out of patient reach: Yes Sharps container removed or out of patient reach: Yes  Patient in room resting. No signs of acute distress at this time. Maintained on 15 minute checks and observation by security camera for safety.  

## 2015-11-24 NOTE — Progress Notes (Addendum)
[facility] [phone] and Guardian Lazaro ArmsKay Flack. DSS [phone] Patient will go back to group home and picked up by 4pm.  Edwar Coe LCSW [phone]

## 2015-11-24 NOTE — ED Notes (Signed)
Patient denies SI/HI/AVH and pain. Patient was compliant with all scheduled medications and is pleasant and cooperative. No signs of auditory hallucinations. Patient denies feelings of depression and anxiety. Will continue to monitor. Maintained on 15 minute checks and observation by security camera for safety.

## 2015-11-24 NOTE — Progress Notes (Signed)
LCSW Called patients guardian Lazaro ArmsKay Flack- [phone] and faxed over labs and consult she reported that [facility] [phone] to arrange for patient transportation. LCSW will await psychiatrist d/c orders/instructions.  Delta Air LinesClaudine Kiara Keep LCSW [phone]

## 2015-11-24 NOTE — ED Notes (Signed)
Patient resting quietly in room. Patient asked for a drink and nurse provided it. Patient has no further complaints at this time. No noted distress or abnormal behaviors noted. Will continue 15 minute checks and observation by security camera for safety.

## 2015-11-24 NOTE — ED Notes (Signed)
Patient in room resting. No signs of distress noted at this time. Maintained on 15 minute checks and observation by security camera for safety.   

## 2015-11-24 NOTE — ED Notes (Addendum)
Patient is in room resting and watching television. Patient refused vital signs. No signs of acute distress noted. Maintained on 15 minute checks and observation by security camera for safety.

## 2015-11-24 NOTE — ED Notes (Signed)
Patient in room resting and writing on pieces of paper. Currently in no signs of distress. Maintained on 15 minute checks and observation by security camera for safety.

## 2015-11-24 NOTE — Discharge Instructions (Signed)
Schizophrenia °Schizophrenia is a mental illness. It may cause disturbed or disorganized thinking, speech, or behavior. People with schizophrenia have problems functioning in one or more areas of life: work, school, home, or relationships. People with schizophrenia are at increased risk for suicide, certain chronic physical illnesses, and unhealthy behaviors, such as smoking and drug use. °People who have family members with schizophrenia are at higher risk of developing the illness. Schizophrenia affects men and women equally but usually appears at an earlier age (teenage or early adult years) in men.  °SYMPTOMS °The earliest symptoms are often subtle (prodrome) and may go unnoticed until the illness becomes more severe (first-break psychosis). Symptoms of schizophrenia may be continuous or may come and go in severity. Episodes often are triggered by major life events, such as family stress, college, military service, marriage, pregnancy or child birth, divorce, or loss of a loved one. People with schizophrenia may see, hear, or feel things that do not exist (hallucinations). They may have false beliefs in spite of obvious proof to the contrary (delusions). Sometimes speech is incoherent or behavior is odd or withdrawn.  °DIAGNOSIS °Schizophrenia is diagnosed through an assessment by your caregiver. Your caregiver will ask questions about your thoughts, behavior, mood, and ability to function in daily life. Your caregiver may ask questions about your medical history and use of alcohol or drugs, including prescription medication. Your caregiver may also order blood tests and imaging exams. Certain medical conditions and substances can cause symptoms that resemble schizophrenia. Your caregiver may refer you to a mental health specialist for evaluation. There are three major criterion for a diagnosis of schizophrenia: °· Two or more of the following five symptoms are present for a month or longer: °¨ Delusions. Often  the delusions are that you are being attacked, harassed, cheated, persecuted or conspired against (persecutory delusions). °¨ Hallucinations.   °¨ Disorganized speech that does not make sense to others. °¨ Grossly disorganized (confused or unfocused) behavior or extremely overactive or underactive motor activity (catatonia). °¨ Negative symptoms such as bland or blunted emotions (flat affect), loss of will power (avolition), and withdrawal from social contacts (social isolation). °· Level of functioning in one or more major areas of life (work, school, relationships, or self-care) is markedly below the level of functioning before the onset of illness.   °· There are continuous signs of illness (either mild symptoms or decreased level of functioning) for at least 6 months or longer. °TREATMENT  °Schizophrenia is a long-term illness. It is best controlled with continuous treatment rather than treatment only when symptoms occur. The following treatments are used to manage schizophrenia: °· Medication--Medication is the most effective and important form of treatment for schizophrenia. Antipsychotic medications are usually prescribed to help manage schizophrenia. Other types of medication may be added to relieve any symptoms that may occur despite the use of antipsychotic medications. °· Counseling or talk therapy--Individual, group, or family counseling may be helpful in providing education, support, and guidance. Many people with schizophrenia also benefit from social skills and job skills (vocational) training. °A combination of medication and counseling is best for managing the disorder over time. A procedure in which electricity is applied to the brain through the scalp (electroconvulsive therapy) may be used to treat catatonic schizophrenia or schizophrenia in people who cannot take or do not respond to medication and counseling. °  °This information is not intended to replace advice given to you by your health  care provider. Make sure you discuss any questions you have with   your health care provider.   Document Released: 06/21/2000 Document Revised: 07/15/2014 Document Reviewed: 09/16/2012 Elsevier Interactive Patient Education [employer]2016 [employer]   Follow-up with the act team return for any further problems continue all your medications.

## 2017-12-17 ENCOUNTER — Emergency Department
Admission: EM | Admit: 2017-12-17 | Discharge: 2017-12-22 | Disposition: A | Discharge status: Home / Self Care | Payer: Medicare Other | Attending: Emergency Medicine | Admitting: Emergency Medicine

## 2017-12-17 ENCOUNTER — Emergency Department: Payer: Medicare Other

## 2017-12-17 ENCOUNTER — Other Ambulatory Visit: Payer: Self-pay

## 2017-12-17 DIAGNOSIS — F203 Undifferentiated schizophrenia: Secondary | ICD-10-CM | POA: Diagnosis not present

## 2017-12-17 DIAGNOSIS — F209 Schizophrenia, unspecified: Secondary | ICD-10-CM | POA: Insufficient documentation

## 2017-12-17 DIAGNOSIS — F29 Unspecified psychosis not due to a substance or known physiological condition: Secondary | ICD-10-CM

## 2017-12-17 DIAGNOSIS — I10 Essential (primary) hypertension: Secondary | ICD-10-CM | POA: Diagnosis not present

## 2017-12-17 DIAGNOSIS — E119 Type 2 diabetes mellitus without complications: Secondary | ICD-10-CM | POA: Insufficient documentation

## 2017-12-17 DIAGNOSIS — E785 Hyperlipidemia, unspecified: Secondary | ICD-10-CM | POA: Diagnosis present

## 2017-12-17 DIAGNOSIS — E039 Hypothyroidism, unspecified: Secondary | ICD-10-CM | POA: Diagnosis not present

## 2017-12-17 DIAGNOSIS — Z7982 Long term (current) use of aspirin: Secondary | ICD-10-CM | POA: Diagnosis not present

## 2017-12-17 DIAGNOSIS — Z79899 Other long term (current) drug therapy: Secondary | ICD-10-CM | POA: Insufficient documentation

## 2017-12-17 DIAGNOSIS — R7303 Prediabetes: Secondary | ICD-10-CM

## 2017-12-17 LAB — COMPREHENSIVE METABOLIC PANEL
ALT: 13 U/L — ABNORMAL LOW (ref 17–63)
AST: 29 U/L (ref 15–41)
Albumin: 4.5 g/dL (ref 3.5–5.0)
Alkaline Phosphatase: 61 U/L (ref 38–126)
Anion gap: 10 (ref 5–15)
BUN: 19 mg/dL (ref 6–20)
CHLORIDE: 106 mmol/L (ref 101–111)
CO2: 24 mmol/L (ref 22–32)
Calcium: 9 mg/dL (ref 8.9–10.3)
Creatinine, Ser: 1.16 mg/dL (ref 0.61–1.24)
Glucose, Bld: 96 mg/dL (ref 65–99)
Potassium: 3.9 mmol/L (ref 3.5–5.1)
SODIUM: 140 mmol/L (ref 135–145)
Total Bilirubin: 0.9 mg/dL (ref 0.3–1.2)
Total Protein: 7.7 g/dL (ref 6.5–8.1)

## 2017-12-17 LAB — CBC WITH DIFFERENTIAL/PLATELET
BASOS ABS: 0 10*3/uL (ref 0–0.1)
Basophils Relative: 0 %
EOS ABS: 0 10*3/uL (ref 0–0.7)
EOS PCT: 0 %
HCT: 41.5 % (ref 40.0–52.0)
Hemoglobin: 13.8 g/dL (ref 13.0–18.0)
LYMPHS PCT: 8 %
Lymphs Abs: 0.6 10*3/uL — ABNORMAL LOW (ref 1.0–3.6)
MCH: 26.4 pg (ref 26.0–34.0)
MCHC: 33.3 g/dL (ref 32.0–36.0)
MCV: 79.2 fL — AB (ref 80.0–100.0)
MONO ABS: 0.5 10*3/uL (ref 0.2–1.0)
Monocytes Relative: 6 %
Neutro Abs: 6.2 10*3/uL (ref 1.4–6.5)
Neutrophils Relative %: 86 %
PLATELETS: 230 10*3/uL (ref 150–440)
RBC: 5.24 MIL/uL (ref 4.40–5.90)
RDW: 14.1 % (ref 11.5–14.5)
WBC: 7.4 10*3/uL (ref 3.8–10.6)

## 2017-12-17 LAB — SALICYLATE LEVEL: Salicylate Lvl: <7 mg/dL (ref 2.8–30.0)

## 2017-12-17 LAB — GLUCOSE, CAPILLARY: Glucose-Capillary: 96 mg/dL (ref 65–99)

## 2017-12-17 LAB — ETHANOL: Alcohol, Ethyl (B): <10 mg/dL (ref <10)

## 2017-12-17 LAB — ACETAMINOPHEN LEVEL: Acetaminophen (Tylenol), Serum: <10 ug/mL — ABNORMAL LOW (ref 10–30)

## 2017-12-17 MED ORDER — LORAZEPAM 2 MG/ML IJ SOLN
INTRAMUSCULAR | Status: AC
  Administered 2017-12-17: 2 mg via INTRAMUSCULAR
  Filled 2017-12-17: qty 1

## 2017-12-17 MED ORDER — LISINOPRIL 5 MG PO TABS
2.5000 mg | ORAL_TABLET | Freq: Every day | ORAL | Status: DC
  Administered 2017-12-18 – 2017-12-22 (×4): 2.5 mg via ORAL
  Filled 2017-12-17 (×5): qty 1

## 2017-12-17 MED ORDER — OLANZAPINE 10 MG PO TABS
20.0000 mg | ORAL_TABLET | Freq: Two times a day (BID) | ORAL | Status: DC
  Administered 2017-12-17 – 2017-12-22 (×10): 20 mg via ORAL
  Filled 2017-12-17 (×11): qty 2

## 2017-12-17 MED ORDER — AMANTADINE HCL 100 MG PO CAPS
100.0000 mg | ORAL_CAPSULE | Freq: Two times a day (BID) | ORAL | Status: DC
  Administered 2017-12-17 – 2017-12-22 (×10): 100 mg via ORAL
  Filled 2017-12-17 (×13): qty 1

## 2017-12-17 MED ORDER — DIPHENHYDRAMINE HCL 50 MG/ML IJ SOLN
50.0000 mg | Freq: Once | INTRAMUSCULAR | Status: AC
  Administered 2017-12-17: 50 mg via INTRAMUSCULAR

## 2017-12-17 MED ORDER — LORAZEPAM 2 MG/ML IJ SOLN
2.0000 mg | Freq: Once | INTRAMUSCULAR | Status: AC
  Administered 2017-12-17: 2 mg via INTRAMUSCULAR

## 2017-12-17 MED ORDER — CLONAZEPAM 0.5 MG PO TABS
0.5000 mg | ORAL_TABLET | Freq: Two times a day (BID) | ORAL | Status: DC
  Administered 2017-12-17 – 2017-12-22 (×10): 0.5 mg via ORAL
  Filled 2017-12-17 (×10): qty 1

## 2017-12-17 MED ORDER — DIPHENHYDRAMINE HCL 50 MG/ML IJ SOLN
INTRAMUSCULAR | Status: AC
  Administered 2017-12-17: 50 mg via INTRAMUSCULAR
  Filled 2017-12-17: qty 1

## 2017-12-17 MED ORDER — LEVOTHYROXINE SODIUM 75 MCG PO TABS
125.0000 ug | ORAL_TABLET | Freq: Every day | ORAL | Status: DC
  Administered 2017-12-18 – 2017-12-22 (×5): 125 ug via ORAL
  Filled 2017-12-17 (×5): qty 2

## 2017-12-17 MED ORDER — ASPIRIN EC 81 MG PO TBEC
81.0000 mg | DELAYED_RELEASE_TABLET | Freq: Every day | ORAL | Status: DC
  Administered 2017-12-18 – 2017-12-22 (×4): 81 mg via ORAL
  Filled 2017-12-17 (×5): qty 1

## 2017-12-17 MED ORDER — SIMVASTATIN 40 MG PO TABS
20.0000 mg | ORAL_TABLET | Freq: Every day | ORAL | Status: DC
  Administered 2017-12-19 – 2017-12-21 (×3): 20 mg via ORAL
  Filled 2017-12-17 (×4): qty 1

## 2017-12-17 MED ORDER — HALOPERIDOL LACTATE 5 MG/ML IJ SOLN
5.0000 mg | Freq: Once | INTRAMUSCULAR | Status: AC
  Administered 2017-12-17: 5 mg via INTRAMUSCULAR

## 2017-12-17 MED ORDER — HALOPERIDOL LACTATE 5 MG/ML IJ SOLN
INTRAMUSCULAR | Status: AC
  Administered 2017-12-17: 5 mg via INTRAMUSCULAR
  Filled 2017-12-17: qty 1

## 2017-12-17 MED ORDER — HALOPERIDOL 5 MG PO TABS
10.0000 mg | ORAL_TABLET | Freq: Two times a day (BID) | ORAL | Status: DC
  Administered 2017-12-17 – 2017-12-22 (×10): 10 mg via ORAL
  Filled 2017-12-17 (×10): qty 2

## 2017-12-17 MED ORDER — METFORMIN HCL 500 MG PO TABS
1000.0000 mg | ORAL_TABLET | Freq: Two times a day (BID) | ORAL | Status: DC
  Administered 2017-12-18 – 2017-12-22 (×7): 1000 mg via ORAL
  Filled 2017-12-17 (×8): qty 2

## 2017-12-17 NOTE — ED Notes (Signed)
He has ambulated to and from the BR with a steady gait  - time of day provided to him by his request  Continue to monitor

## 2017-12-17 NOTE — ED Notes (Signed)
Patient going to CT at this time

## 2017-12-17 NOTE — ED Notes (Signed)
BEHAVIORAL HEALTH ROUNDING Patient sleeping: Yes.   Patient alert and oriented: eyes closed  Appears asleep Behavior appropriate: Yes.  ; If no, describe:  Nutrition and fluids offered: Yes  Toileting and hygiene offered: sleeping Sitter present: q 15 minute observations and security camera monitoring Law enforcement present: yes  ODS 

## 2017-12-17 NOTE — ED Notes (Signed)
BEHAVIORAL HEALTH ROUNDING Patient sleeping: No. Patient alert and oriented: yes Behavior appropriate: Yes.  ; If no, describe:  Nutrition and fluids offered: yes Toileting and hygiene offered: Yes  Sitter present: q15 minute observations and security camera monitoring Law enforcement present: Yes  ODS  

## 2017-12-17 NOTE — ED Notes (Signed)
Group home [phone]
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
## 2017-12-17 NOTE — ED Notes (Signed)
Pt up and ambulatory at this time, steady gait noted

## 2017-12-17 NOTE — ED Notes (Signed)
ED BHU PLACEMENT JUSTIFICATION Is the patient under IVC or is there intent for IVC: Yes.   Is the patient medically cleared: Yes.   Is there vacancy in the ED BHU: Yes.   Is the population mix appropriate for patient: Yes.   Is the patient awaiting placement in inpatient or outpatient setting: Yes.   Has the patient had a psychiatric consult:  Consult pending   Survey of unit performed for contraband, proper placement and condition of furniture, tampering with fixtures in bathroom, shower, and each patient room: Yes.  ; Findings:  APPEARANCE/BEHAVIOR Calm and cooperative NEURO ASSESSMENT Orientation: oriented x3  Denies pain Hallucinations:  Denies at this time  Speech: Normal Gait: normal RESPIRATORY ASSESSMENT Even  Unlabored respirations  CARDIOVASCULAR ASSESSMENT Pulses equal   regular rate  Skin warm and dry   GASTROINTESTINAL ASSESSMENT no GI complaint EXTREMITIES Full ROM  PLAN OF CARE Provide calm/safe environment. Vital signs assessed twice daily. ED BHU Assessment once each 12-hour shift. Collaborate with [name] daily or as condition indicates. Assure the ED provider has rounded once each shift. Provide and encourage hygiene. Provide redirection as needed. Assess for escalating behavior; address immediately and inform ED provider.  Assess family dynamic and appropriateness for visitation as needed: Yes.  ; If necessary, describe findings:  Educate the patient/family about BHU procedures/visitation: Yes.  ; If necessary, describe findings:

## 2017-12-17 NOTE — ED Notes (Signed)
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
## 2017-12-17 NOTE — ED Notes (Addendum)
Received a call from Nicholos JohnsKathleen at [facility] owned by [employer] HoldingsLawanda Ray  [phone]  Update provided to caregiver

## 2017-12-17 NOTE — ED Notes (Signed)
IVC / Consult completed/ Pending Placement 

## 2017-12-17 NOTE — ED Notes (Signed)
Patient assigned to appropriate care area- BHU    Introduced self to pt  Patient oriented to unit/care area: Informed that, for his safety, care areas are designed for safety and monitored by security cameras at all times; phone and visiting hours explained to patient. Patient verbalizes understanding, verbal contract for safety obtained  Environment secured

## 2017-12-17 NOTE — ED Provider Notes (Signed)
-----------------------------------------   11:17 AM on 12/17/2017 -----------------------------------------  Patient's vitals are reassuring, mild hypertension, afebrile.  Lab work is largely nonrevealing.  CT scan of the head is negative.  Awaiting psychiatric evaluation.   Minna AntisPaduchowski, Staria Birkhead, MD 12/17/17 [phone]

## 2017-12-17 NOTE — ED Notes (Signed)
Patient dressed out by this Chief Strategy Officerwriter and tech Lisa. Officers in the room. This Clinical research associatewriter gave patient IM injections per EDP Dr. Dolores FrameSung. Patient hooked up to monitors. EKG performed.

## 2017-12-17 NOTE — Consult Note (Signed)
[name]   Reason for [name]: [name] for 62 year old man with a history of schizophrenia brought in by law enforcement after being found agitated in public Referring Physician: Paduchowski Patient Identification: [name] MRN:  [id number] Principal Diagnosis: Undifferentiated schizophrenia [facility]) Diagnosis:   Patient Active Problem List   Diagnosis Date Noted  . Tardive dyskinesia [G24.01] 11/21/2015  . Noncompliance [Z91.19] 11/21/2015  . HTN (hypertension) [I10] 10/10/2015  . Dyslipidemia [E78.5] 09/25/2015  . Undifferentiated schizophrenia ([place]) [F20.3]   . Diabetes ([place]) [E11.9] 09/16/2015  . Hypothyroid [E03.9] 09/16/2015    Total Time spent with patient: 1 hour  Subjective:   [name] is a 62 y.o. male patient admitted with patient not responsive.  HPI: 62 year old man with a long psychiatric history known to the emergency room who was brought by the law enforcement authorities last night after being found agitated in public.  Details are scanty at this time.  Reportedly the patient was somewhere "near a drug house" and was agitated and appeared to be psychotic.  He was described as being psychotic and agitated when he came to the emergency room last night although not violent towards others.  Today the patient is not responsive verbally to anything I said and has not been talking to other staff as well.  This is a pretty typical presentation for him.  Patient is known to have intermittent spells of near catatonic behavior when he will stop eating and drinking and become medicine noncompliant.  Even at his best he is still typically psychotic.  Vitals unremarkable.  Does not appear to be in particularly bad physical shape right now.  Social history: Previously had lived at a group home.  Not able right now to confirm any information from him.  Medical history: Multiple medical problems including diabetes hypothyroidism dyslipidemia and hypertension  and tardive dyskinesia.  Substance abuse history: Typically does not abuse substances    Past Psychiatric History: Long history of schizophrenia.  He is usually maintained on 2 different antipsychotics as well as mood stabilizing medicine.  When he is medicine compliant he will become usually calm and cooperative although he still has a tendency to rapidly decompensate.  When decompensated he will refuse medication and often stop eating and drinking for days at a time.  Typically does not become violent to others but becomes very withdrawn hyper religious and bizarre.  Risk to Self:   Risk to Others:   Prior Inpatient Therapy:   Prior Outpatient Therapy:    Past Medical History:  Past Medical History:  Diagnosis Date  . Diabetes mellitus without complication ([place])   . HTN (hypertension)   . Hyperammonemia ([facility]) While on Depakote  . Hyperlipemia   . Lithium toxicity   . Neutropenia ([place])   . Schizophrenia ([place])   . Seizure ([place]) While toxic on Depakote  . Sickle cell trait ([place])   . Thyroid disease   . TIA (transient ischemic attack) in 2009   No past surgical history on file. Family History: No family history on file. Family Psychiatric  History: Unknown Social History:  Social History   Substance and Sexual Activity  Alcohol Use No     Social History   Substance and Sexual Activity  Drug Use Not on file    Social History   Socioeconomic History  . Marital status: Single    Spouse name: Not on file  . Number of children: Not on file  . Years of education: Not on file  .  Highest education level: Not on file  Occupational History  . Not on file  Social Needs  . Financial resource strain: Not on file  . Food insecurity:    Worry: Not on file    Inability: Not on file  . Transportation needs:    Medical: Not on file    Non-medical: Not on file  Tobacco Use  . Smoking status: Unknown If Ever Smoked  Substance and Sexual Activity  . Alcohol use: No  . Drug  use: Not on file  . Sexual activity: Not on file  Lifestyle  . Physical activity:    Days per week: Not on file    Minutes per session: Not on file  . Stress: Not on file  Relationships  . Social connections:    Talks on phone: Not on file    Gets together: Not on file    Attends religious service: Not on file    Active member of club or organization: Not on file    Attends meetings of clubs or organizations: Not on file    Relationship status: Not on file  Other Topics Concern  . Not on file  Social History Narrative  . Not on file   Additional Social History:    Allergies:   Allergies  Allergen Reactions  . Depakote [Valproic Acid] Other (See Comments)    Reaction:  Hyperammonemia   . Lithium Other (See Comments)    Pt has a prior history of Lithium toxicity.       Labs:  Results for orders placed or performed during the hospital encounter of 12/17/17 (from the past 48 hour(s))  Glucose, capillary     Status: None   Collection Time: 12/17/17  7:54 AM  Result Value Ref Range   Glucose-Capillary 96 65 - 99 mg/dL  CBC with Differential     Status: Abnormal   Collection Time: 12/17/17  8:42 AM  Result Value Ref Range   WBC 7.4 3.8 - 10.6 K/uL   RBC 5.24 4.40 - 5.90 MIL/uL   Hemoglobin 13.8 13.0 - 18.0 g/dL   HCT 41.5 40.0 - 52.0 %   MCV 79.2 (L) 80.0 - 100.0 fL   MCH 26.4 26.0 - 34.0 pg   MCHC 33.3 32.0 - 36.0 g/dL   RDW 14.1 11.5 - 14.5 %   Platelets 230 150 - 440 K/uL   Neutrophils Relative % 86 %   Neutro Abs 6.2 1.4 - 6.5 K/uL   Lymphocytes Relative 8 %   Lymphs Abs 0.6 (L) 1.0 - 3.6 K/uL   Monocytes Relative 6 %   Monocytes Absolute 0.5 0.2 - 1.0 K/uL   Eosinophils Relative 0 %   Eosinophils Absolute 0.0 0 - 0.7 K/uL   Basophils Relative 0 %   Basophils Absolute 0.0 0 - 0.1 K/uL    Comment: Performed at [facility], [address] [place], [place] [address]  Comprehensive metabolic panel     Status: Abnormal   Collection Time: 12/17/17   8:42 AM  Result Value Ref Range   Sodium 140 135 - 145 mmol/L   Potassium 3.9 3.5 - 5.1 mmol/L   Chloride 106 101 - 111 mmol/L   CO2 24 22 - 32 mmol/L   Glucose, Bld 96 65 - 99 mg/dL   BUN 19 6 - 20 mg/dL   Creatinine, Ser 1.16 0.61 - 1.24 mg/dL   Calcium 9.0 8.9 - 10.3 mg/dL   Total Protein 7.7 6.5 - 8.1 g/dL  Albumin 4.5 3.5 - 5.0 g/dL   AST 29 15 - 41 U/L   ALT 13 (L) 17 - 63 U/L   Alkaline Phosphatase 61 38 - 126 U/L   Total Bilirubin 0.9 0.3 - 1.2 mg/dL   GFR calc non Af Amer >60 >60 mL/min   GFR calc Af Amer >60 >60 mL/min    Comment: (NOTE) The eGFR has been calculated using the CKD EPI equation. This calculation has not been validated in all clinical situations. eGFR's persistently <60 mL/min signify possible Chronic Kidney Disease.    Anion gap 10 5 - 15    Comment: Performed at [facility], [place], [place] [address]  Acetaminophen level     Status: Abnormal   Collection Time: 12/17/17  8:42 AM  Result Value Ref Range   Acetaminophen (Tylenol), Serum <10 (L) 10 - 30 ug/mL    Comment: (NOTE) Therapeutic concentrations vary significantly. A range of 10-30 ug/mL  may be an effective concentration for many patients. However, some  are best treated at concentrations outside of this range. Acetaminophen concentrations >150 ug/mL at 4 hours after ingestion  and >50 ug/mL at 12 hours after ingestion are often associated with  toxic reactions. Performed at [facility], [address] [place], [place] [address]   Salicylate level     Status: None   Collection Time: 12/17/17  8:42 AM  Result Value Ref Range   Salicylate Lvl <4.1 2.8 - 30.0 mg/dL    Comment: Performed at [facility], [address] [place], [place] [address]  Ethanol     Status: None   Collection Time: 12/17/17  8:42 AM  Result Value Ref Range   Alcohol, Ethyl (B) <10 <10 mg/dL    Comment: (NOTE) Lowest detectable limit for serum alcohol is 10  mg/dL. For medical purposes only. Performed at [facility], [address], [place],  [address]     Current Facility-Administered Medications  Medication Dose Route Frequency Provider Last Rate Last Dose  . amantadine (SYMMETREL) capsule 100 mg  100 mg Oral BID Clapacs, Madie Reno, MD      . aspirin EC tablet 81 mg  81 mg Oral Daily Clapacs, John T, MD      . clonazePAM (KLONOPIN) tablet 0.5 mg  0.5 mg Oral BID Clapacs, John T, MD      . haloperidol (HALDOL) tablet 10 mg  10 mg Oral BID Clapacs, Madie Reno, MD      . Derrill Memo ON 12/18/2017] levothyroxine (SYNTHROID, LEVOTHROID) tablet 125 mcg  125 mcg Oral QAC breakfast Clapacs, John T, MD      . lisinopril (PRINIVIL,ZESTRIL) tablet 2.5 mg  2.5 mg Oral Daily Clapacs, John T, MD      . metFORMIN (GLUCOPHAGE) tablet 1,000 mg  1,000 mg Oral BID WC Clapacs, John T, MD      . OLANZapine (ZYPREXA) tablet 20 mg  20 mg Oral BID Clapacs, John T, MD      . simvastatin (ZOCOR) tablet 20 mg  20 mg Oral q1800 Clapacs, Madie Reno, MD       Current Outpatient Medications  Medication Sig Dispense Refill  . acetaminophen (TYLENOL) 650 MG CR tablet Take 650 mg by mouth every 6 (six) hours as needed for pain.    Marland Kitchen alum & mag hydroxide-simeth (MAALOX/MYLANTA) 200-200-20 MG/5ML suspension Take 30 mLs by mouth every 4 (four) hours as needed for indigestion or heartburn.    Marland Kitchen amantadine (SYMMETREL) 100  MG capsule Take 1 capsule (100 mg total) by mouth 2 (two) times daily with a meal. 60 capsule 0  . aspirin EC 81 MG tablet Take 81 mg by mouth daily.    . clonazePAM (KLONOPIN) 0.5 MG tablet Take 1 tablet (0.5 mg total) by mouth 2 (two) times daily with a meal. 60 tablet 0  . haloperidol (HALDOL) 10 MG tablet Take 1 tablet (10 mg total) by mouth 2 (two) times daily. 60 tablet 0  . levothyroxine (SYNTHROID, LEVOTHROID) 125 MCG tablet Take 1 tablet (125 mcg total) by mouth daily before breakfast. 30 tablet 0  . lisinopril (PRINIVIL,ZESTRIL) 2.5 MG tablet Take 1  tablet (2.5 mg total) by mouth daily. 30 tablet 0  . magnesium hydroxide (MILK OF MAGNESIA) 400 MG/5ML suspension Take 30 mLs by mouth daily as needed for mild constipation.    . metFORMIN (GLUCOPHAGE) 1000 MG tablet Take 1 tablet (1,000 mg total) by mouth 2 (two) times daily with a meal. 60 tablet 0  . OLANZapine zydis (ZYPREXA) 20 MG disintegrating tablet Take 20 mg by mouth 2 (two) times daily.    . simvastatin (ZOCOR) 20 MG tablet Take 20 mg by mouth at bedtime.      Musculoskeletal: Strength & Muscle Tone: decreased Gait & Station: normal Patient leans: N/A  Psychiatric Specialty Exam: Physical Exam  Nursing note and vitals reviewed. Constitutional: He appears well-developed and well-nourished.  HENT:  Head: Normocephalic and atraumatic.  Eyes: Pupils are equal, round, and reactive to light. Conjunctivae are normal.  Neck: Normal range of motion.  Cardiovascular: Regular rhythm and normal heart sounds.  Respiratory: Effort normal. No respiratory distress.  GI: Soft.  Musculoskeletal: Normal range of motion.  Neurological: He is alert.  Skin: Skin is warm and dry.  Psychiatric: His affect is blunt. He is noncommunicative.    Review of Systems  Unable to perform ROS: Psychiatric disorder    Blood pressure (!) 145/95, pulse 92, temperature 97.7 F (36.5 C), temperature source Axillary, resp. rate 16, weight 90.7 kg (200 lb), SpO2 100 %.Body mass index is 25.68 kg/m.  General Appearance: Disheveled  Eye Contact:  None  Speech:  Blocked  Volume:  Decreased  Mood:  Negative  Affect:  Negative  Thought Process:  NA  Orientation:  Negative  Thought Content:  Negative  Suicidal Thoughts:  No  Homicidal Thoughts:  No  Memory:  Negative  Judgement:  Negative  Insight:  Negative  Psychomotor Activity:  Decreased  Concentration:  Concentration: Poor  Recall:  Negative  Fund of Knowledge:  Negative  Language:  Negative  Akathisia:  Negative  Handed:  Right  AIMS (if  indicated):     Assets:  Housing  ADL's:  Impaired  Cognition:  Impaired,  Mild  Sleep:        Treatment Plan Summary: Medication management and Plan 62 year old man with long history of psychosis.  When he is not compliant with medicine he is very difficult to manage on the inpatient unit and is not likely to benefit from hospitalization on the short-term unit.  He has a history of multiple hospitalizations at times with long inpatient stays.  Typically our plan in the past has been to refer him to the state psychiatric facility when he comes in like this.  Meanwhile restart medication.  Try to get him to take his medicine and make sure he is eating and drinking.  If he becomes communicative we can find out if his social situation is still  stable.  Orders will not be placed for admission yet at this time.  Case reviewed with [name].  Disposition: Recommend psychiatric Inpatient admission when medically cleared. Supportive therapy provided about ongoing stressors. Discussed crisis plan, support from social network, calling 911, coming to the Emergency Department, and calling Suicide Hotline.  Alethia Berthold, MD 12/17/2017 4:21 PM

## 2017-12-17 NOTE — ED Notes (Signed)
Patient took medications with no issues. Security went with this Clinical research associatewriter for safety.

## 2017-12-17 NOTE — BH Assessment (Addendum)
[facility] pending review.    State Referral Form and supporting documentation [facility] and [facility] and confirmed it was received.   Pending Authorization/Tracking number from [facility] ([id number]).   Received the Authorization/Tracking # [id number]) from [facility] ([id number]).   Verbal screening completed with [facility]([phone]), information faxed and confirmed it was received.

## 2017-12-17 NOTE — BH Assessment (Signed)
Per Dr. Clapacs, patient meets inpatient criteria. Due to patient history of aggression, patient will be referred to [facility]. 

## 2017-12-18 DIAGNOSIS — F203 Undifferentiated schizophrenia: Secondary | ICD-10-CM | POA: Diagnosis not present

## 2017-12-18 NOTE — ED Notes (Signed)
Pt. States "I will not go to heaven if I take man's medicine".  Pt. Reassured "Many people take medicine and still believe in god and will go to heaven".  Pt. States "they are not me". Pt. States "my name is not Owens-IllinoisFinley Rosasco"  Pt. Asked "What do you want me to call you?"  Pt. States" Patient from ......(unknown what he said, patient would not repeat).

## 2017-12-18 NOTE — ED Notes (Signed)
Talked with pt about moving back to the main ED into the room he was in yesterday  - pt states  "Okay just let me finish this and we will go - about 6 minutes"

## 2017-12-18 NOTE — BH Assessment (Signed)
This writer received called from Barbara at [facility] stating patient is now on waitlist 

## 2017-12-18 NOTE — ED Provider Notes (Signed)
-----------------------------------------   8:47 AM on 12/18/2017 -----------------------------------------   Blood pressure (!) 145/95, pulse 92, temperature 97.7 F (36.5 C), temperature source Axillary, resp. rate 16, weight 90.7 kg (200 lb), SpO2 100 %.  The patient had no acute events since last update.  Calm and cooperative at this time.  Disposition is pending Psychiatry/Behavioral Medicine team recommendations.     Merrily Brittleifenbark, Shirah Roseman, MD 12/18/17 [phone]

## 2017-12-18 NOTE — ED Notes (Signed)
Pt. Is writing on paper with crayon.  Pt. States writing about jesus.  Pt. Is hyper-religious, continued to talk religious for most of conversation.

## 2017-12-18 NOTE — ED Notes (Signed)
ED BHU PLACEMENT JUSTIFICATION Is the patient under IVC or is there intent for IVC: Yes.   Is the patient medically cleared: Yes.   Is there vacancy in the ED BHU: Yes.   Is the population mix appropriate for patient: Yes.   Is the patient awaiting placement in inpatient or outpatient setting: Yes awaiting inpatient placement  Has the patient had a psychiatric consult: Yes.   Survey of unit performed for contraband, proper placement and condition of furniture, tampering with fixtures in bathroom, shower, and each patient room: Yes.  ; Findings:  APPEARANCE/BEHAVIOR Calm and cooperative NEURO ASSESSMENT Orientation: oriented x3  Denies pain Hallucinations: No.None noted (Hallucinations) Denies at this time  Speech: Normal Gait: normal RESPIRATORY ASSESSMENT Even  Unlabored respirations  CARDIOVASCULAR ASSESSMENT Pulses equal   regular rate  Skin warm and dry   GASTROINTESTINAL ASSESSMENT no GI complaint EXTREMITIES Full ROM  PLAN OF CARE Provide calm/safe environment. Vital signs assessed twice daily. ED BHU Assessment once each 12-hour shift. Collaborate with [name] daily or as condition indicates. Assure the ED provider has rounded once each shift. Provide and encourage hygiene. Provide redirection as needed. Assess for escalating behavior; address immediately and inform ED provider.  Assess family dynamic and appropriateness for visitation as needed: Yes.  ; If necessary, describe findings:  Educate the patient/family about BHU procedures/visitation: Yes.  ; If necessary, describe findings:

## 2017-12-18 NOTE — ED Notes (Signed)
Pt observed lying in bed  - awake - writing   Pt visualized with NAD  No verbalized needs or concerns at this time  Continue to monitor

## 2017-12-18 NOTE — ED Notes (Signed)

## 2017-12-18 NOTE — ED Notes (Signed)
Pt. Talking to Dr. Antionette Poleslappacs at this time.

## 2017-12-18 NOTE — ED Notes (Signed)
BEHAVIORAL HEALTH ROUNDING Patient sleeping: No. Patient alert and oriented: yes Behavior appropriate: Yes.  ; If no, describe:  Nutrition and fluids offered: yes Toileting and hygiene offered: Yes  Sitter present: q15 minute observations and security camera monitoring Law enforcement present: Yes  ODS  

## 2017-12-18 NOTE — ED Notes (Signed)
Went back into his room  - placed his socks on his feet for him - attempted to give him his evening medicine and transfer to quad area - pt remarks "Why you gotta move me - I don't even know why I am here - now y'all got to mess with me  - I don't know Clapacs but I know Jesus Christ - Why y'all want to medicate me - I am not"  Verbal encouragement and reassurance provided to get him to get out of bed for transfer  Pt continue to refuse  Charge nurse notified

## 2017-12-18 NOTE — Consult Note (Signed)
[place] [facility] Consult   Reason for Consult: Follow-up consult for this patient with chronic schizophrenia who has been in the emergency room a couple days Referring Physician: [facility] Patient Identification: [name] MRN:  [id number] Principal Diagnosis: Undifferentiated schizophrenia [facility]) Diagnosis:   Patient Active Problem List   Diagnosis Date Noted  . Tardive dyskinesia [G24.01] 11/21/2015  . Noncompliance [Z91.19] 11/21/2015  . HTN (hypertension) [I10] 10/10/2015  . Dyslipidemia [E78.5] 09/25/2015  . Undifferentiated schizophrenia ([place]) [F20.3]   . Diabetes ([place]) [E11.9] 09/16/2015  . Hypothyroid [E03.9] 09/16/2015    Total Time spent with patient: 30 minutes  Subjective:   [name] is a 62 y.o. male patient admitted with "I only talk to the [employer]".  HPI: Patient seen chart reviewed.  Patient was awake alert and much more talkative than yesterday.  In between some of his hyper religious statements he was able to answer a few questions lucidly.  It looks like he has been taking medicines more consistently and has been eating and drinking.  Patient has no insight and is still disorganized and paranoid in his speech but has not been violent and has been taking better care of his basic hygiene.  Medically appears to be stabilizing.  He has a few scrapes on his skin but will not discuss where they come from.  Past Psychiatric History: Long-standing mental health problems.  A typical behavior for this patient is to get psychotic and hyper religious and decide that he needs to fast.  He will then become semicatatonic and not eat or drink for days at a time and will refuse his medicine which obviously makes things worse.  Once we get him back on his medicine he usually improves quite a bit.  Risk to Self:   Risk to Others:   Prior Inpatient Therapy:   Prior Outpatient Therapy:    Past Medical History:  Past Medical History:  Diagnosis Date  . Diabetes  mellitus without complication ([place])   . HTN (hypertension)   . Hyperammonemia (HCC) While on Depakote  . Hyperlipemia   . Lithium toxicity   . Neutropenia ([place])   . Schizophrenia ([place])   . Seizure ([place]) While toxic on Depakote  . Sickle cell trait ([place])   . Thyroid disease   . TIA (transient ischemic attack) in 2009   No past surgical history on file. Family History: No family history on file. Family Psychiatric  History: Unknown Social History:  Social History   Substance and Sexual Activity  Alcohol Use No     Social History   Substance and Sexual Activity  Drug Use Not on file    Social History   Socioeconomic History  . Marital status: Single    Spouse name: Not on file  . Number of children: Not on file  . Years of education: Not on file  . Highest education level: Not on file  Occupational History  . Not on file  Social Needs  . Financial resource strain: Not on file  . Food insecurity:    Worry: Not on file    Inability: Not on file  . Transportation needs:    Medical: Not on file    Non-medical: Not on file  Tobacco Use  . Smoking status: Unknown If Ever Smoked  Substance and Sexual Activity  . Alcohol use: No  . Drug use: Not on file  . Sexual activity: Not on file  Lifestyle  . Physical activity:    Days per week:  Not on file    Minutes per session: Not on file  . Stress: Not on file  Relationships  . Social connections:    Talks on phone: Not on file    Gets together: Not on file    Attends religious service: Not on file    Active member of club or organization: Not on file    Attends meetings of clubs or organizations: Not on file    Relationship status: Not on file  Other Topics Concern  . Not on file  Social History Narrative  . Not on file   Additional Social History:    Allergies:   Allergies  Allergen Reactions  . Depakote [Valproic Acid] Other (See Comments)    Reaction:  Hyperammonemia   . Lithium Other (See Comments)     Pt has a prior history of Lithium toxicity.       Labs:  Results for orders placed or performed during the hospital encounter of 12/17/17 (from the past 48 hour(s))  Glucose, capillary     Status: None   Collection Time: 12/17/17  7:54 AM  Result Value Ref Range   Glucose-Capillary 96 65 - 99 mg/dL  CBC with Differential     Status: Abnormal   Collection Time: 12/17/17  8:42 AM  Result Value Ref Range   WBC 7.4 3.8 - 10.6 K/uL   RBC 5.24 4.40 - 5.90 MIL/uL   Hemoglobin 13.8 13.0 - 18.0 g/dL   HCT 41.5 40.0 - 52.0 %   MCV 79.2 (L) 80.0 - 100.0 fL   MCH 26.4 26.0 - 34.0 pg   MCHC 33.3 32.0 - 36.0 g/dL   RDW 14.1 11.5 - 14.5 %   Platelets 230 150 - 440 K/uL   Neutrophils Relative % 86 %   Neutro Abs 6.2 1.4 - 6.5 K/uL   Lymphocytes Relative 8 %   Lymphs Abs 0.6 (L) 1.0 - 3.6 K/uL   Monocytes Relative 6 %   Monocytes Absolute 0.5 0.2 - 1.0 K/uL   Eosinophils Relative 0 %   Eosinophils Absolute 0.0 0 - 0.7 K/uL   Basophils Relative 0 %   Basophils Absolute 0.0 0 - 0.1 K/uL    Comment: Performed at [facility], [address] [place], [place] [address]  Comprehensive metabolic panel     Status: Abnormal   Collection Time: 12/17/17  8:42 AM  Result Value Ref Range   Sodium 140 135 - 145 mmol/L   Potassium 3.9 3.5 - 5.1 mmol/L   Chloride 106 101 - 111 mmol/L   CO2 24 22 - 32 mmol/L   Glucose, Bld 96 65 - 99 mg/dL   BUN 19 6 - 20 mg/dL   Creatinine, Ser 1.16 0.61 - 1.24 mg/dL   Calcium 9.0 8.9 - 10.3 mg/dL   Total Protein 7.7 6.5 - 8.1 g/dL   Albumin 4.5 3.5 - 5.0 g/dL   AST 29 15 - 41 U/L   ALT 13 (L) 17 - 63 U/L   Alkaline Phosphatase 61 38 - 126 U/L   Total Bilirubin 0.9 0.3 - 1.2 mg/dL   GFR calc non Af Amer >60 >60 mL/min   GFR calc Af Amer >60 >60 mL/min    Comment: (NOTE) The eGFR has been calculated using the CKD EPI equation. This calculation has not been validated in all clinical situations. eGFR's persistently <60 mL/min signify possible Chronic  Kidney Disease.    Anion gap 10 5 - 15    Comment:  Performed at [facility], [address] [place], [place] [address]  Acetaminophen level     Status: Abnormal   Collection Time: 12/17/17  8:42 AM  Result Value Ref Range   Acetaminophen (Tylenol), Serum <10 (L) 10 - 30 ug/mL    Comment: (NOTE) Therapeutic concentrations vary significantly. A range of 10-30 ug/mL  may be an effective concentration for many patients. However, some  are best treated at concentrations outside of this range. Acetaminophen concentrations >150 ug/mL at 4 hours after ingestion  and >50 ug/mL at 12 hours after ingestion are often associated with  toxic reactions. Performed at [facility], [address] [place], [place] [address]   Salicylate level     Status: None   Collection Time: 12/17/17  8:42 AM  Result Value Ref Range   Salicylate Lvl <2.8 2.8 - 30.0 mg/dL    Comment: Performed at [facility], [address] [place], [place] [address]  Ethanol     Status: None   Collection Time: 12/17/17  8:42 AM  Result Value Ref Range   Alcohol, Ethyl (B) <10 <10 mg/dL    Comment: (NOTE) Lowest detectable limit for serum alcohol is 10 mg/dL. For medical purposes only. Performed at [facility], [address] [place], [place] [address]     Current Facility-Administered Medications  Medication Dose Route Frequency Provider Last Rate Last Dose  . amantadine (SYMMETREL) capsule 100 mg  100 mg Oral BID Aariah Godette, Madie Reno, MD   100 mg at 12/18/17 0911  . aspirin EC tablet 81 mg  81 mg Oral Daily Aubrianne Molyneux, Madie Reno, MD   81 mg at 12/18/17 0911  . clonazePAM (KLONOPIN) tablet 0.5 mg  0.5 mg Oral BID Lendy Dittrich T, MD   0.5 mg at 12/18/17 0911  . haloperidol (HALDOL) tablet 10 mg  10 mg Oral BID Akshat Minehart, Madie Reno, MD   10 mg at 12/18/17 0910  . levothyroxine (SYNTHROID, LEVOTHROID) tablet 125 mcg  125 mcg Oral QAC breakfast Tarnisha Kachmar, Madie Reno, MD   125 mcg at 12/18/17 0912  .  lisinopril (PRINIVIL,ZESTRIL) tablet 2.5 mg  2.5 mg Oral Daily Ivelis Norgard T, MD   2.5 mg at 12/18/17 0912  . metFORMIN (GLUCOPHAGE) tablet 1,000 mg  1,000 mg Oral BID WC Glendoris Nodarse, Madie Reno, MD   1,000 mg at 12/18/17 0911  . OLANZapine (ZYPREXA) tablet 20 mg  20 mg Oral BID Sallyann Kinnaird, Madie Reno, MD   20 mg at 12/18/17 0912  . simvastatin (ZOCOR) tablet 20 mg  20 mg Oral q1800 Jamyron Redd, Madie Reno, MD       Current Outpatient Medications  Medication Sig Dispense Refill  . amantadine (SYMMETREL) 100 MG capsule Take 1 capsule (100 mg total) by mouth 2 (two) times daily with a meal. 60 capsule 0  . aspirin EC 81 MG tablet Take 81 mg by mouth daily.    . clonazePAM (KLONOPIN) 0.5 MG tablet Take 1 tablet (0.5 mg total) by mouth 2 (two) times daily with a meal. 60 tablet 0  . folic acid (FOLVITE) 1 MG tablet Take 1 mg by mouth daily.    . haloperidol (HALDOL) 10 MG tablet Take 1 tablet (10 mg total) by mouth 2 (two) times daily. (Patient taking differently: Take 1 tablet (10MG) by mouth every morning and take 1 tablets (15MG) by mouth every evening) 60 tablet 0  . levothyroxine (SYNTHROID, LEVOTHROID) 125 MCG tablet Take 1 tablet (125 mcg total) by mouth daily before  breakfast. 30 tablet 0  . lisinopril (PRINIVIL,ZESTRIL) 2.5 MG tablet Take 1 tablet (2.5 mg total) by mouth daily. 30 tablet 0  . metFORMIN (GLUCOPHAGE) 1000 MG tablet Take 1 tablet (1,000 mg total) by mouth 2 (two) times daily with a meal. 60 tablet 0  . OLANZapine zydis (ZYPREXA) 20 MG disintegrating tablet Take 20 mg by mouth 2 (two) times daily.    . simvastatin (ZOCOR) 20 MG tablet Take 20 mg by mouth at bedtime.    . vitamin B-12 (CYANOCOBALAMIN) 1000 MCG tablet Take 1,000 mcg by mouth daily.      Musculoskeletal: Strength & Muscle Tone: within normal limits Gait & Station: normal Patient leans: N/A  Psychiatric Specialty Exam: Physical Exam  Nursing note and vitals reviewed. Constitutional: He appears well-developed and well-nourished.   HENT:  Head: Normocephalic and atraumatic.  Eyes: Pupils are equal, round, and reactive to light. Conjunctivae are normal.  Neck: Normal range of motion.  Cardiovascular: Regular rhythm and normal heart sounds.  Respiratory: Effort normal. No respiratory distress.  GI: Soft.  Musculoskeletal: Normal range of motion.  Neurological: He is alert.  Skin: Skin is warm and dry.  Psychiatric: His affect is blunt and inappropriate. His speech is tangential. He is agitated. He is not aggressive. Thought content is delusional. Cognition and memory are impaired. He expresses impulsivity. He expresses no homicidal and no suicidal ideation.    Review of Systems  Constitutional: Negative.   HENT: Negative.   Eyes: Negative.   Respiratory: Negative.   Cardiovascular: Negative.   Gastrointestinal: Negative.   Musculoskeletal: Negative.   Skin: Negative.   Neurological: Negative.   Psychiatric/Behavioral: Negative.     Blood pressure (!) 145/95, pulse 92, temperature 97.7 F (36.5 C), temperature source Axillary, resp. rate 16, weight 90.7 kg (200 lb), SpO2 100 %.Body mass index is 25.68 kg/m.  General Appearance: Disheveled  Eye Contact:  Minimal  Speech:  Garbled  Volume:  Decreased  Mood:  Euthymic  Affect:  Constricted  Thought Process:  Disorganized  Orientation:  Negative  Thought Content:  Illogical  Suicidal Thoughts:  No  Homicidal Thoughts:  No  Memory:  Immediate;   Poor Recent;   Poor Remote;   Fair  Judgement:  Impaired  Insight:  Lacking  Psychomotor Activity:  Decreased  Concentration:  Concentration: Poor  Recall:  Poor  Fund of Knowledge:  Fair  Language:  Fair  Akathisia:  No  Handed:  Right  AIMS (if indicated):     Assets:  Housing  ADL's:  Impaired  Cognition:  Impaired,  Mild  Sleep:        Treatment Plan Summary: Daily contact with patient to assess and evaluate symptoms and progress in treatment, Medication management and Plan Although [name] is  still very psychotic and disorganized he is much better than he was yesterday.  From what I have seen of him before this is probably the road back to his baseline.  He is not aggressive or threatening or violent.  Although he is still religiously fixated he has been eating and taking his medicine.  As the usual plan dictates we had referred him to [facility] however if he were to return to his baseline and be cooperative with discharge we could consider getting him back to his group home instead.  No change to his medicine reevaluate tomorrow.  Disposition: Supportive therapy provided about ongoing stressors. Discussed crisis plan, support from social network, calling 911, coming to the Emergency Department,  and calling Suicide Hotline.  Alethia Berthold, MD 12/18/2017 7:28 PM

## 2017-12-18 NOTE — ED Notes (Signed)
[facility]

## 2017-12-18 NOTE — ED Notes (Signed)
Pt. Requested and was given cup of water. 

## 2017-12-18 NOTE — BH Assessment (Signed)
This Clinical research associatewriter spoke with Britta MccreedyBarbara, Merchandiser[facility] and she stated patient is under review with medical.

## 2017-12-18 NOTE — ED Notes (Signed)
Pt. Came to nursing station and pointed to his mouth.  When this nurse came to door patient points to his mouth.   Patients meds were held and given.  Pt. Said thank you.  Pt. Asked "Are you hungry?"  Pt. States "yes"  Pt. Given meal tray, pt. Again said "thank you".

## 2017-12-19 DIAGNOSIS — F203 Undifferentiated schizophrenia: Secondary | ICD-10-CM | POA: Diagnosis not present

## 2017-12-19 NOTE — Consult Note (Signed)
[facility]   Reason for Consult: Follow-up consult for 62 year old man in the emergency room with a history of schizophrenia. Referring Physician: Cyril LoosenKinner Patient Identification: [name] MRN:  [id number] Principal Diagnosis: Undifferentiated schizophrenia [facility](HCC) Diagnosis:   Patient Active Problem List   Diagnosis Date Noted  . Tardive dyskinesia [G24.01] 11/21/2015  . Noncompliance [Z91.19] 11/21/2015  . HTN (hypertension) [I10] 10/10/2015  . Dyslipidemia [E78.5] 09/25/2015  . Undifferentiated schizophrenia (HCC) [F20.3]   . Diabetes (HCC) [E11.9] 09/16/2015  . Hypothyroid [E03.9] 09/16/2015    Total Time spent with patient: 30 minutes  Subjective:   [name] is a 62 y.o. male patient admitted with "I only talk to God".  HPI: Patient seen chart reviewed.  Spoke with nursing and security on duty.  Patient has been compliant with medication and has been eating better.  He has managed to communicate more rationally at times.  On interview with me he is able to answer questions and makes sense may be half of the time.  The rest of the time he is very disorganized in his speech throwing and a lot of religious words but none of it really making sense.  His insight is none to good.  Nevertheless he has not been violent and has not tried to hurt himself.  Spends most of his time scribbling in crayon on pieces of paper.  Past Psychiatric History: Long history of psychotic disorder with intermittent episodes of which this is typical where he will get off his medicines and become psychotic and hyper religious.  Usually functions much better once he is on his antipsychotics  Risk to Self:   Risk to Others:   Prior Inpatient Therapy:   Prior Outpatient Therapy:    Past Medical History:  Past Medical History:  Diagnosis Date  . Diabetes mellitus without complication (HCC)   . HTN (hypertension)   . Hyperammonemia (HCC) While on Depakote  . Hyperlipemia   .  Lithium toxicity   . Neutropenia (HCC)   . Schizophrenia (HCC)   . Seizure (HCC) While toxic on Depakote  . Sickle cell trait (HCC)   . Thyroid disease   . TIA (transient ischemic attack) in 2009   No past surgical history on file. Family History: No family history on file. Family Psychiatric  History: Unknown Social History:  Social History   Substance and Sexual Activity  Alcohol Use No     Social History   Substance and Sexual Activity  Drug Use Not on file    Social History   Socioeconomic History  . Marital status: Single    Spouse name: Not on file  . Number of children: Not on file  . Years of education: Not on file  . Highest education level: Not on file  Occupational History  . Not on file  Social Needs  . Financial resource strain: Not on file  . Food insecurity:    Worry: Not on file    Inability: Not on file  . Transportation needs:    Medical: Not on file    Non-medical: Not on file  Tobacco Use  . Smoking status: Unknown If Ever Smoked  Substance and Sexual Activity  . Alcohol use: No  . Drug use: Not on file  . Sexual activity: Not on file  Lifestyle  . Physical activity:    Days per week: Not on file    Minutes per session: Not on file  . Stress: Not on file  Relationships  .  Social connections:    Talks on phone: Not on file    Gets together: Not on file    Attends religious service: Not on file    Active member of club or organization: Not on file    Attends meetings of clubs or organizations: Not on file    Relationship status: Not on file  Other Topics Concern  . Not on file  Social History Narrative  . Not on file   Additional Social History:    Allergies:   Allergies  Allergen Reactions  . Depakote [Valproic Acid] Other (See Comments)    Reaction:  Hyperammonemia   . Lithium Other (See Comments)    Pt has a prior history of Lithium toxicity.       Labs: No results found for this or any previous visit (from the past 48  hour(s)).  Current Facility-Administered Medications  Medication Dose Route Frequency Provider Last Rate Last Dose  . amantadine (SYMMETREL) capsule 100 mg  100 mg Oral BID Steffany Schoenfelder, Jackquline Denmark, MD   100 mg at 12/19/17 1209  . aspirin EC tablet 81 mg  81 mg Oral Daily Anjelika Ausburn, Jackquline Denmark, MD   81 mg at 12/18/17 0911  . clonazePAM (KLONOPIN) tablet 0.5 mg  0.5 mg Oral BID Bridney Guadarrama T, MD   0.5 mg at 12/19/17 1209  . haloperidol (HALDOL) tablet 10 mg  10 mg Oral BID Sydni Elizarraraz, Jackquline Denmark, MD   10 mg at 12/19/17 1209  . levothyroxine (SYNTHROID, LEVOTHROID) tablet 125 mcg  125 mcg Oral QAC breakfast Cadin Luka, Jackquline Denmark, MD   125 mcg at 12/19/17 0841  . lisinopril (PRINIVIL,ZESTRIL) tablet 2.5 mg  2.5 mg Oral Daily Tayson Schnelle T, MD   2.5 mg at 12/19/17 1210  . metFORMIN (GLUCOPHAGE) tablet 1,000 mg  1,000 mg Oral BID WC Cassady Stanczak, Jackquline Denmark, MD   1,000 mg at 12/19/17 0841  . OLANZapine (ZYPREXA) tablet 20 mg  20 mg Oral BID Jaslen Adcox, Jackquline Denmark, MD   20 mg at 12/19/17 0842  . simvastatin (ZOCOR) tablet 20 mg  20 mg Oral q1800 Lanique Gonzalo, Jackquline Denmark, MD       Current Outpatient Medications  Medication Sig Dispense Refill  . amantadine (SYMMETREL) 100 MG capsule Take 1 capsule (100 mg total) by mouth 2 (two) times daily with a meal. 60 capsule 0  . aspirin EC 81 MG tablet Take 81 mg by mouth daily.    . clonazePAM (KLONOPIN) 0.5 MG tablet Take 1 tablet (0.5 mg total) by mouth 2 (two) times daily with a meal. 60 tablet 0  . folic acid (FOLVITE) 1 MG tablet Take 1 mg by mouth daily.    . haloperidol (HALDOL) 10 MG tablet Take 1 tablet (10 mg total) by mouth 2 (two) times daily. (Patient taking differently: Take 1 tablet (10MG ) by mouth every morning and take 1 tablets (15MG ) by mouth every evening) 60 tablet 0  . levothyroxine (SYNTHROID, LEVOTHROID) 125 MCG tablet Take 1 tablet (125 mcg total) by mouth daily before breakfast. 30 tablet 0  . lisinopril (PRINIVIL,ZESTRIL) 2.5 MG tablet Take 1 tablet (2.5 mg total) by mouth daily.  30 tablet 0  . metFORMIN (GLUCOPHAGE) 1000 MG tablet Take 1 tablet (1,000 mg total) by mouth 2 (two) times daily with a meal. 60 tablet 0  . OLANZapine zydis (ZYPREXA) 20 MG disintegrating tablet Take 20 mg by mouth 2 (two) times daily.    . simvastatin (ZOCOR) 20 MG tablet Take 20 mg by mouth  at bedtime.    . vitamin B-12 (CYANOCOBALAMIN) 1000 MCG tablet Take 1,000 mcg by mouth daily.      Musculoskeletal: Strength & Muscle Tone: within normal limits Gait & Station: normal Patient leans: N/A  Psychiatric Specialty Exam: Physical Exam  Nursing note and vitals reviewed. Constitutional: He appears well-developed and well-nourished.  HENT:  Head: Normocephalic and atraumatic.  Eyes: Pupils are equal, round, and reactive to light. Conjunctivae are normal.  Neck: Normal range of motion.  Cardiovascular: Regular rhythm and normal heart sounds.  Respiratory: Effort normal. No respiratory distress.  GI: Soft.  Musculoskeletal: Normal range of motion.  Neurological: He is alert.  Skin: Skin is warm and dry.  Psychiatric: His affect is blunt and inappropriate. His speech is tangential and slurred. He is actively hallucinating. He is not agitated. Thought content is paranoid and delusional. Cognition and memory are impaired. He expresses impulsivity and inappropriate judgment. He expresses no homicidal and no suicidal ideation. He is noncommunicative.    Review of Systems  Constitutional: Negative.   HENT: Negative.   Eyes: Negative.   Respiratory: Negative.   Cardiovascular: Negative.   Gastrointestinal: Negative.   Musculoskeletal: Negative.   Skin: Negative.   Neurological: Negative.   Psychiatric/Behavioral: Negative for depression, hallucinations, memory loss, substance abuse and suicidal ideas. The patient is not nervous/anxious and does not have insomnia.     Blood pressure 138/86, pulse (!) 116, temperature 97.7 F (36.5 C), temperature source Axillary, resp. rate 18, weight  90.7 kg (200 lb), SpO2 98 %.Body mass index is 25.68 kg/m.  General Appearance: Disheveled  Eye Contact:  Fair  Speech:  Garbled, Slow and Slurred  Volume:  Decreased  Mood:  Euthymic  Affect:  Constricted  Thought Process:  Disorganized  Orientation:  Negative  Thought Content:  Illogical and Delusions  Suicidal Thoughts:  No  Homicidal Thoughts:  No  Memory:  Immediate;   Fair Recent;   Poor Remote;   Poor  Judgement:  Impaired  Insight:  Shallow  Psychomotor Activity:  Decreased  Concentration:  Concentration: Poor  Recall:  Poor  Fund of Knowledge:  Poor  Language:  Poor  Akathisia:  No  Handed:  Right  AIMS (if indicated):     Assets:  Financial Resources/Insurance Housing  ADL's:  Impaired  Cognition:  Impaired,  Mild  Sleep:        Treatment Plan Summary: Daily contact with patient to assess and evaluate symptoms and progress in treatment, Medication management and Plan Patient does not typically benefit from hospitalization on our unit.  [facility]  The plan in recent years has typically been to try to refer him to [facility].  This has been done at this time.  While we are waiting we are keeping him on his antipsychotic medicine and he is starting to be more compliant.  I see some improvement and if he is doing reasonably well by Monday and has not yet been [facility] we may be able to discharge him back to his group home.  Case reviewed with nursing and ER doctor  Disposition: Supportive therapy provided about ongoing stressors. Discussed crisis plan, support from social network, calling 911, coming to the Emergency Department, and calling Suicide Hotline.  Mordecai Rasmussen, MD 12/19/2017 3:21 PM

## 2017-12-19 NOTE — ED Notes (Signed)
Pt up to bathroom several times to get water. Mumbling to self and hard to understand when staff tries to engage him. Initially refused meds then came back and took them after much prompting from staff.

## 2017-12-19 NOTE — ED Notes (Signed)
[facility]  ON  [facility]  WAITLIST 

## 2017-12-19 NOTE — ED Notes (Signed)
Pt given meal tray.

## 2017-12-19 NOTE — BH Assessment (Signed)
Per Nina at [facility], patient is still on waitlist.  

## 2017-12-19 NOTE — ED Notes (Signed)
Pt given dinner tray.

## 2017-12-19 NOTE — ED Notes (Signed)
Pt eventually took meds after much prompting from staff. Restless but remains calm.

## 2017-12-19 NOTE — ED Provider Notes (Signed)
-----------------------------------------   7:29 AM on 12/19/2017 -----------------------------------------   BP 138/84   Pulse 80   Temp 97.7 F (36.5 C) (Axillary)   Resp 16   Wt 90.7 kg (200 lb)   SpO2 98%   BMI 25.68 kg/m    Vitals reviewed. Patient remains medically cleared.  Disposition is pending per Psychiatry/Behavioral Medicine team recommendations.    Jene EveryKinner, Mandee Pluta, MD 12/19/17 [phone]

## 2017-12-19 NOTE — ED Notes (Signed)
Pt would not consent to vital signs. Keeps mumbling to himself while laying in bed. Offered breakfast tray which he took.

## 2017-12-19 NOTE — ED Notes (Signed)
Pt refusing meds as ordered. Mumbling under his breath but eventually spoke loud enough stating "I won't take them. And I won't forget how you guys treating me in here". Bizarre but calm.

## 2017-12-19 NOTE — ED Notes (Signed)
Pt. Up from bed using bathroom, pt. Returned to room with steady gait.

## 2017-12-20 NOTE — ED Notes (Signed)

## 2017-12-20 NOTE — ED Notes (Signed)
Pt compliant with evening medications. Pt's speech is mumbled, continues with bizarre behavior, but no aggression. Maintained on 15 minute checks and observation by security camera for safety.

## 2017-12-20 NOTE — ED Notes (Signed)
Pt moving from room to dayroom. Bizarre behavior. No aggression noted. Maintained on 15 minute checks and observation by security camera for safety.

## 2017-12-20 NOTE — ED Notes (Signed)
Pt given meal tray.

## 2017-12-20 NOTE — ED Provider Notes (Signed)
-----------------------------------------   6:39 AM on 12/20/2017 -----------------------------------------   Blood pressure (!) 148/89, pulse (!) 102, temperature 97.7 F (36.5 C), temperature source Axillary, resp. rate 19, weight 90.7 kg (200 lb), SpO2 98 %.  The patient had no acute events since last update.  Sleeping at this time.  Disposition is pending Psychiatry/Behavioral Medicine team recommendations.     Irean HongSung, Orazio Weller J, MD 12/20/17 [phone]

## 2017-12-20 NOTE — ED Notes (Signed)
Patient received PM snack. 

## 2017-12-21 NOTE — ED Provider Notes (Signed)
-----------------------------------------   6:40 AM on 12/21/2017 -----------------------------------------   Blood pressure (!) 148/89, pulse (!) 102, temperature 97.7 F (36.5 C), temperature source Axillary, resp. rate 19, weight 90.7 kg (200 lb), SpO2 98 %.  The patient had no acute events since last update.  Calm and cooperative at this time.  Disposition is pending Psychiatry/Behavioral Medicine team recommendations.     Irean HongSung, Jade J, MD 12/21/17 [phone]

## 2017-12-21 NOTE — ED Notes (Signed)
Pt refused vital signs. "I'm going to bed."

## 2017-12-21 NOTE — ED Notes (Signed)
Pt to nurses station door to speak with staff. Pt pleasant, talking to staff about basketball and religion.  Speech difficult to understand at times (garbled).  Pt given behavioral medicine pen. Maintained on 15 minute checks and observation by security camera for safety.

## 2017-12-21 NOTE — ED Notes (Signed)
Patient asleep in room. No noted distress or abnormal behavior. Will continue 15 minute checks and observation by security cameras for safety. 

## 2017-12-21 NOTE — ED Notes (Signed)
PT IVC/ PENDING PLACEMENT  

## 2017-12-21 NOTE — ED Notes (Signed)
Patient refused his medications and stated "God already gave them to me, him and my momma, gave me my medicine". Discussed with patient the importance of taking his medicine, patient continued to refuse and said "I already took them". Will continue to try and administer

## 2017-12-21 NOTE — ED Notes (Signed)
Patient asked for and received PM snack.

## 2017-12-21 NOTE — ED Notes (Signed)
BEHAVIORAL HEALTH ROUNDING Patient sleeping: No. Patient alert and oriented: yes Behavior appropriate: Yes.  ; If no, describe:  Nutrition and fluids offered: yes Toileting and hygiene offered: Yes  Sitter present: q15 minute observations and security monitoring Law enforcement present: Yes    

## 2017-12-22 DIAGNOSIS — F203 Undifferentiated schizophrenia: Secondary | ICD-10-CM | POA: Diagnosis not present

## 2017-12-22 NOTE — BH Assessment (Addendum)
Per [facility] at [facility] patient is still on waitlist.   

## 2017-12-22 NOTE — ED Notes (Signed)
ED BHU PLACEMENT JUSTIFICATION Is the patient under IVC or is there intent for IVC: Yes.   Is the patient medically cleared: Yes.   Is there vacancy in the ED BHU: Yes.   Is the population mix appropriate for patient: Yes.   Is the patient awaiting placement in inpatient or outpatient setting: Yes.   Has the patient had a psychiatric consult: Yes.   Survey of unit performed for contraband, proper placement and condition of furniture, tampering with fixtures in bathroom, shower, and each patient room: Yes.   APPEARANCE/BEHAVIOR calm and adequate rapport can be established NEURO ASSESSMENT Orientation: place and person Hallucinations: Yes.  Auditory Hallucinations Speech: Normal Gait: normal RESPIRATORY ASSESSMENT Normal expansion.  Clear to auscultation.  No rales, rhonchi, or wheezing. CARDIOVASCULAR ASSESSMENT regular rate and rhythm, S1, S2 normal, no murmur, click, rub or gallop GASTROINTESTINAL ASSESSMENT soft, nontender, BS WNL, no r/g EXTREMITIES normal strength, tone, and muscle mass PLAN OF CARE Provide calm/safe environment. Vital signs assessed twice daily. ED BHU Assessment once each 12-hour shift. Collaborate with intake RN daily or as condition indicates. Assure the ED provider has rounded once each shift. Provide and encourage hygiene. Provide redirection as needed. Assess for escalating behavior; address immediately and inform ED provider.  Assess family dynamic and appropriateness for visitation as needed: Yes.   Educate the patient/family about BHU procedures/visitation: Yes.

## 2017-12-22 NOTE — Consult Note (Signed)
[facility] Face-to-Face Psychiatry Consult   Reason for Consult: Consult follow-up 62 year old man with schizophrenia Referring Physician: Paduchowski Patient Identification: [name] MRN:  [id number] Principal Diagnosis: Undifferentiated schizophrenia [facility]) Diagnosis:   Patient Active Problem List   Diagnosis Date Noted  . Tardive dyskinesia [G24.01] 11/21/2015  . Noncompliance [Z91.19] 11/21/2015  . HTN (hypertension) [I10] 10/10/2015  . Dyslipidemia [E78.5] 09/25/2015  . Undifferentiated schizophrenia (HCC) [F20.3]   . Diabetes (HCC) [E11.9] 09/16/2015  . Hypothyroid [E03.9] 09/16/2015    Total Time spent with patient: 30 minutes  Subjective:   [name] is a 62 y.o. male patient admitted with "I am a believer in [place]".  HPI: Patient interviewed chart reviewed.  Case reviewed with nursing.  62 year old man well-known to psychiatry who came into the hospital agitated and psychotic.  He was put back on his medication and has returned to his baseline.  He is now medication compliant, eating normally, not agitated threatening or hostile.  We spoke today and he is agreeable to going back to his group home.  Past Psychiatric History: Long history of schizophrenia multiple hospitalizations.  Decompensates quickly when off his medicine  Risk to Self:   Risk to Others:   Prior Inpatient Therapy:   Prior Outpatient Therapy:    Past Medical History:  Past Medical History:  Diagnosis Date  . Diabetes mellitus without complication (HCC)   . HTN (hypertension)   . Hyperammonemia (HCC) While on Depakote  . Hyperlipemia   . Lithium toxicity   . Neutropenia (HCC)   . Schizophrenia (HCC)   . Seizure (HCC) While toxic on Depakote  . Sickle cell trait (HCC)   . Thyroid disease   . TIA (transient ischemic attack) in 2009   No past surgical history on file. Family History: No family history on file. Family Psychiatric  History: None Social History:  Social History   Substance and  Sexual Activity  Alcohol Use No     Social History   Substance and Sexual Activity  Drug Use Not on file    Social History   Socioeconomic History  . Marital status: Single    Spouse name: Not on file  . Number of children: Not on file  . Years of education: Not on file  . Highest education level: Not on file  Occupational History  . Not on file  Social Needs  . Financial resource strain: Not on file  . Food insecurity:    Worry: Not on file    Inability: Not on file  . Transportation needs:    Medical: Not on file    Non-medical: Not on file  Tobacco Use  . Smoking status: Unknown If Ever Smoked  Substance and Sexual Activity  . Alcohol use: No  . Drug use: Not on file  . Sexual activity: Not on file  Lifestyle  . Physical activity:    Days per week: Not on file    Minutes per session: Not on file  . Stress: Not on file  Relationships  . Social connections:    Talks on phone: Not on file    Gets together: Not on file    Attends religious service: Not on file    Active member of club or organization: Not on file    Attends meetings of clubs or organizations: Not on file    Relationship status: Not on file  Other Topics Concern  . Not on file  Social History Narrative  . Not on file   Additional  Social History:    Allergies:   Allergies  Allergen Reactions  . Depakote [Valproic Acid] Other (See Comments)    Reaction:  Hyperammonemia   . Lithium Other (See Comments)    Pt has a prior history of Lithium toxicity.       Labs: No results found for this or any previous visit (from the past 48 hour(s)).  Current Facility-Administered Medications  Medication Dose Route Frequency Provider Last Rate Last Dose  . amantadine (SYMMETREL) capsule 100 mg  100 mg Oral BID Kwanza Cancelliere, Jackquline DenmarkJohn T, MD   100 mg at 12/22/17 0918  . aspirin EC tablet 81 mg  81 mg Oral Daily Kristofer Schaffert, Jackquline DenmarkJohn T, MD   81 mg at 12/22/17 0919  . clonazePAM (KLONOPIN) tablet 0.5 mg  0.5 mg Oral BID  Jakalyn Kratky T, MD   0.5 mg at 12/22/17 0918  . haloperidol (HALDOL) tablet 10 mg  10 mg Oral BID Martie Muhlbauer, Jackquline DenmarkJohn T, MD   10 mg at 12/22/17 0919  . levothyroxine (SYNTHROID, LEVOTHROID) tablet 125 mcg  125 mcg Oral QAC breakfast Harutyun Monteverde, Jackquline DenmarkJohn T, MD   125 mcg at 12/22/17 0919  . lisinopril (PRINIVIL,ZESTRIL) tablet 2.5 mg  2.5 mg Oral Daily Jozalynn Noyce, Jackquline DenmarkJohn T, MD   2.5 mg at 12/22/17 0933  . metFORMIN (GLUCOPHAGE) tablet 1,000 mg  1,000 mg Oral BID WC Rosamund Nyland, Jackquline DenmarkJohn T, MD   1,000 mg at 12/22/17 0918  . OLANZapine (ZYPREXA) tablet 20 mg  20 mg Oral BID Kevonna Nolte, Jackquline DenmarkJohn T, MD   20 mg at 12/22/17 0920  . simvastatin (ZOCOR) tablet 20 mg  20 mg Oral q1800 Eliazer Hemphill, Jackquline DenmarkJohn T, MD   20 mg at 12/21/17 1657   Current Outpatient Medications  Medication Sig Dispense Refill  . amantadine (SYMMETREL) 100 MG capsule Take 1 capsule (100 mg total) by mouth 2 (two) times daily with a meal. 60 capsule 0  . aspirin EC 81 MG tablet Take 81 mg by mouth daily.    . clonazePAM (KLONOPIN) 0.5 MG tablet Take 1 tablet (0.5 mg total) by mouth 2 (two) times daily with a meal. 60 tablet 0  . folic acid (FOLVITE) 1 MG tablet Take 1 mg by mouth daily.    . haloperidol (HALDOL) 10 MG tablet Take 1 tablet (10 mg total) by mouth 2 (two) times daily. (Patient taking differently: Take 1 tablet (10MG ) by mouth every morning and take 1 tablets (15MG ) by mouth every evening) 60 tablet 0  . levothyroxine (SYNTHROID, LEVOTHROID) 125 MCG tablet Take 1 tablet (125 mcg total) by mouth daily before breakfast. 30 tablet 0  . lisinopril (PRINIVIL,ZESTRIL) 2.5 MG tablet Take 1 tablet (2.5 mg total) by mouth daily. 30 tablet 0  . metFORMIN (GLUCOPHAGE) 1000 MG tablet Take 1 tablet (1,000 mg total) by mouth 2 (two) times daily with a meal. 60 tablet 0  . OLANZapine zydis (ZYPREXA) 20 MG disintegrating tablet Take 20 mg by mouth 2 (two) times daily.    . simvastatin (ZOCOR) 20 MG tablet Take 20 mg by mouth at bedtime.    . vitamin B-12 (CYANOCOBALAMIN) 1000  MCG tablet Take 1,000 mcg by mouth daily.      Musculoskeletal: Strength & Muscle Tone: within normal limits Gait & Station: normal Patient leans: N/A  Psychiatric Specialty Exam: Physical Exam  Nursing note and vitals reviewed. Constitutional: He appears well-developed and well-nourished.  HENT:  Head: Normocephalic and atraumatic.  Eyes: Pupils are equal, round, and reactive to light. Conjunctivae are normal.  Neck:  Normal range of motion.  Cardiovascular: Regular rhythm and normal heart sounds.  Respiratory: Effort normal. No respiratory distress.  GI: Soft.  Musculoskeletal: Normal range of motion.  Neurological: He is alert.  Skin: Skin is warm and dry.  Psychiatric: Judgment normal. His affect is blunt. His speech is delayed. He is slowed. Thought content is delusional. Cognition and memory are impaired. He expresses no homicidal and no suicidal ideation.    Review of Systems  Constitutional: Negative.   HENT: Negative.   Eyes: Negative.   Respiratory: Negative.   Cardiovascular: Negative.   Gastrointestinal: Negative.   Musculoskeletal: Negative.   Skin: Negative.   Neurological: Negative.   Psychiatric/Behavioral: Negative.     Blood pressure (!) 147/90, pulse 90, temperature (!) 97 F (36.1 C), resp. rate 18, weight 90.7 kg (200 lb), SpO2 98 %.Body mass index is 25.68 kg/m.  General Appearance: Casual  Eye Contact:  Fair  Speech:  Slow  Volume:  Decreased  Mood:  Euthymic  Affect:  Constricted  Thought Process:  Goal Directed  Orientation:  Full (Time, Place, and Person)  Thought Content:  Rumination  Suicidal Thoughts:  No  Homicidal Thoughts:  No  Memory:  Immediate;   Fair Recent;   Fair Remote;   Fair  Judgement:  Fair  Insight:  Fair  Psychomotor Activity:  Decreased  Concentration:  Concentration: Fair  Recall:  [employer] of Knowledge:  Fair  Language:  Fair  Akathisia:  No  Handed:  Right  AIMS (if indicated):     Assets:  Financial  Resources/Insurance Housing Physical Health Resilience Social Support  ADL's:  Intact  Cognition:  Impaired,  Mild  Sleep:        Treatment Plan Summary: Medication management and Plan 62 year old man with schizophrenia appears to have returned to his baseline.  No longer acutely dangerous.  Agreeable to continuing his medicine.  Patient was counseled about medication usage and the importance of stability.  At this point he no longer meets commitment criteria.  Transfer to [facility] unlikely.  Discontinue IVC.  Reviewed case with [name] and emergency room physician.  Recommend patient can be discharged back to his group home.  Disposition: No evidence of imminent risk to self or others at present.   Patient does not meet criteria for psychiatric inpatient admission. Supportive therapy provided about ongoing stressors. Discussed crisis plan, support from social network, calling 911, coming to the Emergency Department, and calling Suicide Hotline.  Mordecai Rasmussen, MD 12/22/2017 4:40 PM

## 2017-12-22 NOTE — ED Provider Notes (Signed)
-----------------------------------------   4:08 PM on 12/22/2017 -----------------------------------------  Patient has been seen and evaluated by psychiatry.  They believe the patient is safe for discharge home from a psychiatric standpoint.  Patient's medical work-up is been largely nonrevealing.  Patient will return back to his group facility.   Minna AntisPaduchowski, Atziri Zubiate, MD 12/22/17 [phone]

## 2017-12-22 NOTE — BH Assessment (Signed)
This Clinical research associatewriter contacted Gregory [facility]anda [name] to inform [facility] is being d/c and needs to be picked up from [facility]. Rowan BlaseLawanda stated she will be here by 5:00pm to pick up patient.  Contacted Legal Guardian with [facility] [name] and was told she is no longer with the agency.

## 2017-12-22 NOTE — ED Provider Notes (Signed)
-----------------------------------------   7:41 AM on 12/22/2017 -----------------------------------------   Blood pressure (!) 148/89, pulse (!) 102, temperature 97.7 F (36.5 C), temperature source Axillary, resp. rate 19, weight 90.7 kg (200 lb), SpO2 98 %.  The patient had no acute events since last update.  Calm and cooperative at this time.  [facility]     Rebecka ApleyWebster, Allison P, MD 12/22/17 [phone]

## 2017-12-22 NOTE — Discharge Instructions (Signed)
You have been seen in the emergency department for a  psychiatric concern. You have been evaluated both medically as well as psychiatrically. Please follow-up with your outpatient resources provided. Return to the emergency department for any worsening symptoms, or any thoughts of hurting yourself or anyone else so that we may attempt to help you. 

## 2017-12-22 NOTE — ED Notes (Signed)
Patient sitting on his bed, writing in his journal, NAD observed and will continue to monitor

## 2017-12-22 NOTE — ED Notes (Signed)
Patient discharged back to group home [facility]), patient was picked up by Leron CroakMarvin P. (staff), NAD. Patient appropriate and cooperative. Patient denied SI/HI, belongings given to patient and discharge paperwork given to patient.

## 2018-04-07 ENCOUNTER — Other Ambulatory Visit: Payer: Self-pay

## 2018-04-07 ENCOUNTER — Emergency Department
Admission: EM | Admit: 2018-04-07 | Discharge: 2018-04-08 | Disposition: A | Discharge status: Home / Self Care | Payer: Medicare Other | Attending: Emergency Medicine | Admitting: Emergency Medicine

## 2018-04-07 DIAGNOSIS — E785 Hyperlipidemia, unspecified: Secondary | ICD-10-CM | POA: Diagnosis present

## 2018-04-07 DIAGNOSIS — Z046 Encounter for general psychiatric examination, requested by authority: Secondary | ICD-10-CM | POA: Diagnosis not present

## 2018-04-07 DIAGNOSIS — R7303 Prediabetes: Secondary | ICD-10-CM

## 2018-04-07 DIAGNOSIS — Z7984 Long term (current) use of oral hypoglycemic drugs: Secondary | ICD-10-CM | POA: Insufficient documentation

## 2018-04-07 DIAGNOSIS — F918 Other conduct disorders: Secondary | ICD-10-CM | POA: Diagnosis present

## 2018-04-07 DIAGNOSIS — Z79899 Other long term (current) drug therapy: Secondary | ICD-10-CM | POA: Insufficient documentation

## 2018-04-07 DIAGNOSIS — F209 Schizophrenia, unspecified: Secondary | ICD-10-CM | POA: Insufficient documentation

## 2018-04-07 DIAGNOSIS — R454 Irritability and anger: Secondary | ICD-10-CM | POA: Diagnosis not present

## 2018-04-07 DIAGNOSIS — R410 Disorientation, unspecified: Secondary | ICD-10-CM | POA: Diagnosis not present

## 2018-04-07 DIAGNOSIS — I10 Essential (primary) hypertension: Secondary | ICD-10-CM | POA: Diagnosis not present

## 2018-04-07 DIAGNOSIS — E039 Hypothyroidism, unspecified: Secondary | ICD-10-CM | POA: Diagnosis present

## 2018-04-07 DIAGNOSIS — Z7982 Long term (current) use of aspirin: Secondary | ICD-10-CM | POA: Diagnosis not present

## 2018-04-07 DIAGNOSIS — R4689 Other symptoms and signs involving appearance and behavior: Secondary | ICD-10-CM

## 2018-04-07 DIAGNOSIS — E119 Type 2 diabetes mellitus without complications: Secondary | ICD-10-CM | POA: Insufficient documentation

## 2018-04-07 DIAGNOSIS — F203 Undifferentiated schizophrenia: Secondary | ICD-10-CM | POA: Diagnosis present

## 2018-04-07 DIAGNOSIS — G2401 Drug induced subacute dyskinesia: Secondary | ICD-10-CM | POA: Diagnosis present

## 2018-04-07 LAB — COMPREHENSIVE METABOLIC PANEL
ALBUMIN: 4.7 g/dL (ref 3.5–5.0)
ALT: 11 U/L (ref 0–44)
AST: 18 U/L (ref 15–41)
Alkaline Phosphatase: 76 U/L (ref 38–126)
Anion gap: 9 (ref 5–15)
BUN: 12 mg/dL (ref 8–23)
CALCIUM: 8.9 mg/dL (ref 8.9–10.3)
CHLORIDE: 101 mmol/L (ref 98–111)
CO2: 28 mmol/L (ref 22–32)
CREATININE: 1.03 mg/dL (ref 0.61–1.24)
GFR calc Af Amer: >60 mL/min (ref >60)
GFR calc non Af Amer: >60 mL/min (ref >60)
GLUCOSE: 145 mg/dL — AB (ref 70–99)
Potassium: 3.8 mmol/L (ref 3.5–5.1)
Sodium: 138 mmol/L (ref 135–145)
Total Bilirubin: 0.9 mg/dL (ref 0.3–1.2)
Total Protein: 7.7 g/dL (ref 6.5–8.1)

## 2018-04-07 LAB — CBC
HEMATOCRIT: 39.6 % — AB (ref 40.0–52.0)
Hemoglobin: 13.8 g/dL (ref 13.0–18.0)
MCH: 27.5 pg (ref 26.0–34.0)
MCHC: 34.8 g/dL (ref 32.0–36.0)
MCV: 79.1 fL — AB (ref 80.0–100.0)
PLATELETS: 248 10*3/uL (ref 150–440)
RBC: 5.01 MIL/uL (ref 4.40–5.90)
RDW: 14.5 % (ref 11.5–14.5)
WBC: 4 10*3/uL (ref 3.8–10.6)

## 2018-04-07 LAB — ETHANOL: Alcohol, Ethyl (B): <10 mg/dL (ref <10)

## 2018-04-07 LAB — SALICYLATE LEVEL: Salicylate Lvl: <7 mg/dL (ref 2.8–30.0)

## 2018-04-07 LAB — ACETAMINOPHEN LEVEL: Acetaminophen (Tylenol), Serum: <10 ug/mL — ABNORMAL LOW (ref 10–30)

## 2018-04-07 MED ORDER — HALOPERIDOL 5 MG PO TABS
10.0000 mg | ORAL_TABLET | Freq: Two times a day (BID) | ORAL | Status: DC
  Administered 2018-04-07 – 2018-04-08 (×2): 10 mg via ORAL
  Filled 2018-04-07 (×2): qty 2

## 2018-04-07 MED ORDER — METFORMIN HCL 500 MG PO TABS
1000.0000 mg | ORAL_TABLET | Freq: Two times a day (BID) | ORAL | Status: DC
  Administered 2018-04-08 (×2): 1000 mg via ORAL
  Filled 2018-04-07 (×2): qty 2

## 2018-04-07 MED ORDER — CLONAZEPAM 0.5 MG PO TBDP
0.5000 mg | ORAL_TABLET | Freq: Two times a day (BID) | ORAL | Status: DC
  Administered 2018-04-08: 0.5 mg via ORAL
  Filled 2018-04-07: qty 1

## 2018-04-07 MED ORDER — SIMVASTATIN 20 MG PO TABS
20.0000 mg | ORAL_TABLET | Freq: Every day | ORAL | Status: DC
  Administered 2018-04-08: 20 mg via ORAL
  Filled 2018-04-07 (×2): qty 1

## 2018-04-07 MED ORDER — LISINOPRIL 5 MG PO TABS
2.5000 mg | ORAL_TABLET | Freq: Every day | ORAL | Status: DC
  Administered 2018-04-07 – 2018-04-08 (×2): 2.5 mg via ORAL
  Filled 2018-04-07 (×2): qty 1

## 2018-04-07 MED ORDER — LEVOTHYROXINE SODIUM 125 MCG PO TABS
125.0000 ug | ORAL_TABLET | Freq: Every day | ORAL | Status: DC
  Administered 2018-04-08: 125 ug via ORAL
  Filled 2018-04-07: qty 1

## 2018-04-07 MED ORDER — OLANZAPINE 10 MG PO TABS: 20.0000 mg | ORAL_TABLET | Freq: Every day | ORAL | Status: DC

## 2018-04-07 MED ORDER — AMANTADINE HCL 100 MG PO CAPS
100.0000 mg | ORAL_CAPSULE | Freq: Two times a day (BID) | ORAL | Status: DC
  Administered 2018-04-07 – 2018-04-08 (×2): 100 mg via ORAL
  Filled 2018-04-07 (×4): qty 1

## 2018-04-07 NOTE — Discharge Instructions (Addendum)
You have been seen in the Emergency Department (ED)  today for a psychiatric complaint.  You have been evaluated by psychiatry and we believe you are safe to be discharged from the hospital.   ° °Please return to the Emergency Department (ED)  immediately if you have ANY thoughts of hurting yourself or anyone else, so that we may help you. ° °Please avoid alcohol and drug use. ° °Follow up with your doctor and/or therapist as soon as possible regarding today's ED  visit.  ° °You may call crisis hotline for [place] at [phone]. ° °

## 2018-04-07 NOTE — ED Notes (Signed)
He has changed his clothing with assistance and lab specimens obtained  - box of items placed into a bag (not inventoried)  One pair of khaki pants with a rip in the right leg, one brown belt,  One [place] sweatshirt and one [place] shirt,  One pair of gray socks  - pt arrived with no shoes  No other valuables seen  Items labeled in one big bag and placed into locker room for safe keeping

## 2018-04-07 NOTE — ED Notes (Signed)
BEHAVIORAL HEALTH ROUNDING Patient sleeping: No. Patient alert and oriented:  Alert - orientation is questionable  Behavior appropriate: Yes.  ; If no, describe:  Nutrition and fluids offered: yes Toileting and hygiene offered: Yes  Sitter present: q15 minute observations and security monitoring Law enforcement present: Yes  ODS

## 2018-04-07 NOTE — ED Notes (Signed)
BEHAVIORAL HEALTH ROUNDING Patient sleeping: No. Patient alert : yes Behavior appropriate: Yes.  ; If no, describe:  Nutrition and fluids offered: yes Toileting and hygiene offered: Yes  Sitter present: q15 minute observations and security monitoring Law enforcement present: Yes  ODS  

## 2018-04-07 NOTE — ED Notes (Signed)
I made an attempt to call the group home but received no answer  - Nigel Bridgeman - the pts guardian reassured me that the group home owner Jimmye Norman will be here in the am to pick him up    [facility]  Cell [phone]                     Office [phone]                     Fax  [phone]   pts sister  Maksym Pfiffner Cardona  [phone]
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
## 2018-04-07 NOTE — ED Notes (Signed)
Hourly rounding reveals patient in room. No complaints, stable, in no acute distress. Q15 minute rounds and monitoring via [employer] to continue. 

## 2018-04-07 NOTE — ED Notes (Signed)
IVC/  PENDING  PLACEMENT 

## 2018-04-07 NOTE — ED Notes (Signed)
Pt arrives from triage to room 23  - pt has all of his belongings on his body and he is holding a box of papers/deodorant and other misc items under his arm  - with much encouragement he did go into his room
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
## 2018-04-07 NOTE — ED Notes (Signed)
Pt. Transferred to BHU from ED to room 1 after screening for contraband. Report to include Situation, Background, Assessment and Recommendations from Kenisha RN. Pt. Oriented to unit including Q15 minute rounds as well as the security cameras for their protection. Patient is alert and oriented, warm and dry in no acute distress. Patient denies SI, HI, and AVH. Pt. Encouraged to let me know if needs arise. 

## 2018-04-07 NOTE — BH Assessment (Signed)
Writer called and left a HIPPA Compliant message with Group Home [phone]), requesting a return phone call.

## 2018-04-07 NOTE — ED Notes (Signed)
Pt refusing to cooperate with Triage process. Pt refusing to answer questions, and will not let this RN take vital signs. Pt is extremely irritable, repeating "I don't know you", and hyper-focused on the palm of his right hand.

## 2018-04-07 NOTE — ED Notes (Signed)
BEHAVIORAL HEALTH ROUNDING Patient sleeping: Yes.   Patient alert and oriented: eyes closed  Appears to be asleep Behavior appropriate: Yes.  ; If no, describe:  Nutrition and fluids offered: Yes  Toileting and hygiene offered: sleeping Sitter present: q 15 minute observations and security monitoring Law enforcement present: yes  ODS 

## 2018-04-07 NOTE — ED Notes (Addendum)
BEHAVIORAL HEALTH ROUNDING Patient sleeping: No. Patient alert and oriented:  Alert - orientation is questionable   Behavior appropriate: Yes.  ; If no, describe:  Nutrition and fluids offered: yes Toileting and hygiene offered: Yes  Sitter present: q15 minute observations and security monitoring Law enforcement present: Yes  ODS  ENVIRONMENTAL ASSESSMENT Potentially harmful objects out of patient reach: Yes.   Personal belongings secured: Yes.   Patient dressed in hospital provided attire only: Yes.   Plastic bags out of patient reach: Yes.   Patient care equipment (cords, cables, call bells, lines, and drains) shortened, removed, or accounted for: Yes.   Equipment and supplies removed from bottom of stretcher: Yes.   Potentially toxic materials out of patient reach: Yes.   Sharps container removed or out of patient reach: Yes.

## 2018-04-07 NOTE — ED Notes (Signed)
Originally [employer] refused to allow this pt to return to their group home  - guardian spoke with Jimmye Norman  Group home director and she has agreed to allow this pt to return to the group home but requested overnight to move another pt so that Jaxston will be better taken care of

## 2018-04-07 NOTE — ED Notes (Signed)
IVC  RESCINDED  PER  DR  CLAPACS  PENDING  D/C 

## 2018-04-07 NOTE — ED Notes (Signed)
BEHAVIORAL HEALTH ROUNDING Patient sleeping: No. Patient alert and oriented: yes Behavior appropriate: Yes.  ; If no, describe:  Nutrition and fluids offered: yes Toileting and hygiene offered: Yes  Sitter present: q15 minute observations and security  monitoring Law enforcement present: Yes  ODS  
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
## 2018-04-08 DIAGNOSIS — F203 Undifferentiated schizophrenia: Secondary | ICD-10-CM | POA: Diagnosis not present

## 2018-04-08 NOTE — ED Notes (Signed)
Hourly rounding reveals patient sleeping in room. No complaints, stable, in no acute distress. Q15 minute rounds and monitoring via Security Cameras to continue. 

## 2018-04-08 NOTE — Progress Notes (Signed)
LCSW spoke to [facility] nurse and we are awaiting patient to be re-accessed. Patient is from [facility] and has a guardian Careers information officer.   [employer] LCSW [phone]

## 2018-04-08 NOTE — ED Provider Notes (Signed)
-----------------------------------------   6:39 AM on 04/08/2018 -----------------------------------------   Blood pressure (!) 151/90, pulse 80, temperature 98 F (36.7 C), temperature source Oral, resp. rate 17, height 6\' 4"  (1.93 m), weight 91 kg, SpO2 99 %.  The patient had no acute events since last update.  Calm and cooperative at this time.  Disposition is pending Psychiatry/Behavioral Medicine team recommendations.     Irean Hong, MD 04/08/18 [phone]

## 2018-04-08 NOTE — ED Notes (Signed)
Another phone call placed to pt's guardian, [name], to follow up about the status of [name]'s group home.  Left a voicemail message.
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
## 2018-04-08 NOTE — ED Notes (Signed)
Called pt's group home and left a voicemail message informing them that the pt has been discharged and needs to be picked up.

## 2018-04-08 NOTE — ED Notes (Signed)
Hourly rounding reveals patient in room. No complaints, stable, in no acute distress. Q15 minute rounds and monitoring via [employer] to continue. 

## 2018-04-08 NOTE — ED Notes (Signed)
Pt's guardian, [name], called and stated that she would contact the group home.  She stated that she would follow up with me after calling the group home.

## 2018-04-08 NOTE — Progress Notes (Signed)
LCSW texted Brock Bad and informed her that patient is DC and can be either picked up or asked if she would prefer him to be sent by cab. Awaiting response  Khristen Cheyney LCSW [phone]

## 2018-04-08 NOTE — Progress Notes (Signed)
Pt's guardian, [name], called.  Stated that she had spoken to Jimmye Norman, owner of [facility], around 2:30 p.m.  She stated that [place] told her that she was coming to the pick up the patient.  Eboni provided the correct number to [facility] which is [phone].  Called and spoke to staff member at group home who informed me that Jimmye Norman told her that she was coming to pick up the patient because she was told to pack up his medication and all of his belongings.  [name] stated that she would reach out to [employer] and call me back.

## 2018-04-08 NOTE — ED Notes (Signed)
Pt discharged to lobby where [name] was waiting to take him home.  Pt verbalized understanding of discharge instructions and follow up plan.  Pt denied SI, HI, and A/V hallucinations.  Appears stable and in no acute distress.
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
## 2018-04-08 NOTE — ED Notes (Addendum)
Call placed to patient's guardian, assigned social worker is [name], [phone] office or [phone] cell.  Left voicemail messages on both numbers letting guardian know that the patient will be discharged back to the group home this afternoon.

## 2018-04-08 NOTE — ED Notes (Signed)
Called [facility] and left a voicemail informing them that the patient has been discharged and needs a ride back to the group home.

## 2018-04-08 NOTE — ED Notes (Signed)
Pt. Somewhat disoriented/irritable after move. Indicates that he will not take evening meds.

## 2018-04-08 NOTE — ED Notes (Signed)
Another phone call placed to [facility] Group home at [phone].  No one answered.  Left a voicemail message.  Awaiting a return phone call.

## 2018-04-08 NOTE — ED Provider Notes (Signed)
Discussed with psychiatry Dr. Toni Amend who finds the patient psychiatrically stable for discharge back to group home.  Remains medically stable, without acute complaints   Sharman Cheek, MD 04/08/18 1556

## 2018-11-29 ENCOUNTER — Other Ambulatory Visit: Payer: Self-pay

## 2018-11-29 ENCOUNTER — Emergency Department
Admission: EM | Admit: 2018-11-29 | Discharge: 2018-11-29 | Disposition: A | Discharge status: Home / Self Care | Payer: Medicare Other | Attending: Emergency Medicine | Admitting: Emergency Medicine

## 2018-11-29 ENCOUNTER — Emergency Department: Payer: Medicare Other

## 2018-11-29 DIAGNOSIS — Z7982 Long term (current) use of aspirin: Secondary | ICD-10-CM | POA: Diagnosis not present

## 2018-11-29 DIAGNOSIS — E039 Hypothyroidism, unspecified: Secondary | ICD-10-CM | POA: Diagnosis not present

## 2018-11-29 DIAGNOSIS — E119 Type 2 diabetes mellitus without complications: Secondary | ICD-10-CM | POA: Insufficient documentation

## 2018-11-29 DIAGNOSIS — Z7984 Long term (current) use of oral hypoglycemic drugs: Secondary | ICD-10-CM | POA: Diagnosis not present

## 2018-11-29 DIAGNOSIS — Z8673 Personal history of transient ischemic attack (TIA), and cerebral infarction without residual deficits: Secondary | ICD-10-CM | POA: Insufficient documentation

## 2018-11-29 DIAGNOSIS — Z23 Encounter for immunization: Secondary | ICD-10-CM | POA: Insufficient documentation

## 2018-11-29 DIAGNOSIS — R569 Unspecified convulsions: Secondary | ICD-10-CM | POA: Diagnosis not present

## 2018-11-29 DIAGNOSIS — Z1159 Encounter for screening for other viral diseases: Secondary | ICD-10-CM | POA: Insufficient documentation

## 2018-11-29 DIAGNOSIS — I10 Essential (primary) hypertension: Secondary | ICD-10-CM | POA: Insufficient documentation

## 2018-11-29 DIAGNOSIS — F209 Schizophrenia, unspecified: Secondary | ICD-10-CM

## 2018-11-29 LAB — COMPREHENSIVE METABOLIC PANEL
ALT: 12 U/L (ref 0–44)
AST: 24 U/L (ref 15–41)
Albumin: 4.2 g/dL (ref 3.5–5.0)
Alkaline Phosphatase: 61 U/L (ref 38–126)
Anion gap: 17 — ABNORMAL HIGH (ref 5–15)
BUN: 12 mg/dL (ref 8–23)
CO2: 20 mmol/L — ABNORMAL LOW (ref 22–32)
Calcium: 8.9 mg/dL (ref 8.9–10.3)
Chloride: 103 mmol/L (ref 98–111)
Creatinine, Ser: 1.22 mg/dL (ref 0.61–1.24)
GFR calc Af Amer: >60 mL/min (ref >60)
GFR calc non Af Amer: >60 mL/min (ref >60)
Glucose, Bld: 194 mg/dL — ABNORMAL HIGH (ref 70–99)
Potassium: 3.5 mmol/L (ref 3.5–5.1)
Sodium: 140 mmol/L (ref 135–145)
Total Bilirubin: 0.6 mg/dL (ref 0.3–1.2)
Total Protein: 7.3 g/dL (ref 6.5–8.1)

## 2018-11-29 LAB — CBC WITH DIFFERENTIAL/PLATELET
Abs Immature Granulocytes: 0.02 10*3/uL (ref 0.00–0.07)
Basophils Absolute: 0 10*3/uL (ref 0.0–0.1)
Basophils Relative: 0 %
Eosinophils Absolute: 0 10*3/uL (ref 0.0–0.5)
Eosinophils Relative: 1 %
HCT: 44.2 % (ref 39.0–52.0)
Hemoglobin: 14.3 g/dL (ref 13.0–17.0)
Immature Granulocytes: 1 %
Lymphocytes Relative: 32 %
Lymphs Abs: 1.3 10*3/uL (ref 0.7–4.0)
MCH: 25.4 pg — ABNORMAL LOW (ref 26.0–34.0)
MCHC: 32.4 g/dL (ref 30.0–36.0)
MCV: 78.6 fL — ABNORMAL LOW (ref 80.0–100.0)
Monocytes Absolute: 0.3 10*3/uL (ref 0.1–1.0)
Monocytes Relative: 7 %
Neutro Abs: 2.5 10*3/uL (ref 1.7–7.7)
Neutrophils Relative %: 59 %
Platelets: 299 10*3/uL (ref 150–400)
RBC: 5.62 MIL/uL (ref 4.22–5.81)
RDW: 13.7 % (ref 11.5–15.5)
WBC: 4.1 10*3/uL (ref 4.0–10.5)
nRBC: 0 % (ref 0.0–0.2)

## 2018-11-29 LAB — APTT: aPTT: 28 seconds (ref 24–36)

## 2018-11-29 LAB — SARS CORONAVIRUS 2 BY RT PCR (HOSPITAL ORDER, PERFORMED IN ~~LOC~~ HOSPITAL LAB): SARS Coronavirus 2: NEGATIVE

## 2018-11-29 LAB — TROPONIN I: Troponin I: <0.03 ng/mL (ref <0.03)

## 2018-11-29 LAB — PROTIME-INR
INR: 1.1 (ref 0.8–1.2)
Prothrombin Time: 13.9 seconds (ref 11.4–15.2)

## 2018-11-29 MED ORDER — SODIUM CHLORIDE 0.9 % IV BOLUS
1000.0000 mL | Freq: Once | INTRAVENOUS | Status: AC
  Administered 2018-11-29: 15:00:00 1000 mL via INTRAVENOUS

## 2018-11-29 MED ORDER — LEVETIRACETAM 500 MG PO TABS: 500.0000 mg | ORAL_TABLET | Freq: Two times a day (BID) | ORAL | 2 refills | Status: DC

## 2018-11-29 MED ORDER — SODIUM CHLORIDE 0.9 % IV BOLUS
1000.0000 mL | Freq: Once | INTRAVENOUS | Status: AC
  Administered 2018-11-29: 14:00:00 1000 mL via INTRAVENOUS

## 2018-11-29 MED ORDER — LEVETIRACETAM IN NACL 1500 MG/100ML IV SOLN
1500.0000 mg | Freq: Once | INTRAVENOUS | Status: AC
  Administered 2018-11-29: 1500 mg via INTRAVENOUS
  Filled 2018-11-29: qty 100

## 2018-11-29 MED ORDER — TETANUS-DIPHTH-ACELL PERTUSSIS 5-2.5-18.5 LF-MCG/0.5 IM SUSP
0.5000 mL | Freq: Once | INTRAMUSCULAR | Status: AC
  Administered 2018-11-29: 17:00:00 0.5 mL via INTRAMUSCULAR
  Filled 2018-11-29: qty 0.5

## 2018-11-29 MED ORDER — LORAZEPAM 2 MG/ML IJ SOLN
INTRAMUSCULAR | Status: AC
  Administered 2018-11-29: 14:00:00 1 mg via INTRAVENOUS
  Filled 2018-11-29: qty 1

## 2018-11-29 MED ORDER — LORAZEPAM 2 MG/ML IJ SOLN
1.0000 mg | Freq: Once | INTRAMUSCULAR | Status: AC
  Administered 2018-11-29: 16:00:00 1 mg via INTRAVENOUS
  Filled 2018-11-29: qty 1

## 2018-11-29 MED ORDER — LEVETIRACETAM IN NACL 1500 MG/100ML IV SOLN
1500.0000 mg | Freq: Once | INTRAVENOUS | Status: DC
  Filled 2018-11-29: qty 100

## 2018-11-29 MED ORDER — LORAZEPAM 2 MG/ML IJ SOLN
1.0000 mg | Freq: Once | INTRAMUSCULAR | Status: AC
  Administered 2018-11-29: 1 mg via INTRAVENOUS

## 2018-11-29 MED ORDER — METOPROLOL TARTRATE 5 MG/5ML IV SOLN
5.0000 mg | Freq: Once | INTRAVENOUS | Status: AC
  Administered 2018-11-29: 17:00:00 5 mg via INTRAVENOUS
  Filled 2018-11-29: qty 5

## 2018-11-29 MED ORDER — LORAZEPAM 2 MG/ML IJ SOLN
INTRAMUSCULAR | Status: AC
  Administered 2018-11-29: 1 mg via INTRAVENOUS
  Filled 2018-11-29: qty 1
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
## 2018-11-29 NOTE — ED Notes (Signed)
Dr Mayford Knife updated on situation with pt- that he is still up out of bed and walking unsteady and that the group home wanted him to come back by cab- Dr Mayford Knife suggested trying to get him back by ambulance

## 2018-11-29 NOTE — ED Notes (Addendum)
Ariel RN called the last number in pt chart (in notes) to attempt to find which group home pt lives at and a number for his legal guardian. Ariel RN spoke to [employer] (supervisor of group home named [facility]) at [phone] who gave number for Jimmye Norman [phone]. She stated pt lives at [facility]. Facility is faxing over medication list. Mrs Rosalia Hammers stated she is NOT pt legal guardian, she is the owner of the facility. She told Associate Professor that pt legal guardian is [employer].
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
## 2018-11-29 NOTE — ED Notes (Addendum)
Pt placed on bed alarm and door remains open- pt given call bell- seizure pads placed on bed

## 2018-11-29 NOTE — ED Notes (Signed)
Pt continues to get out of bed every few minutes. Pt will stand and walk around room with unsteady gait. Gerilyn Pilgrim EDT remains at bedside for safety.

## 2018-11-29 NOTE — ED Provider Notes (Signed)
Patient seen and examined by me, is alert but disoriented.  Reportedly this is his baseline with his schizophrenia.  No recent seizure-like activity but he is somewhat agitated.  I have ordered IV Ativan and we are pending psychiatric consultation.  He has mild sinus tachycardia currently.   Emily Filbert, MD 11/29/18 [phone]

## 2018-11-29 NOTE — ED Notes (Signed)
When pt stands up his HR jumps up to 140's. When pt lays back down after a few minutes HR will decreased to 110's. Will continue to monitor. Gerilyn Pilgrim EDT remains at bedside for safety sitting d/t pt frequently getting up.

## 2018-11-29 NOTE — ED Provider Notes (Signed)
-----------------------------------------   1:32 PM on 11/29/2018 -----------------------------------------  CT just called.  CT was finished patient had about 1/32 seizure on the table.  He is finished seizing now.  This is his at least second seizure in the last hour or so we will give him some Keppra IV.   Arnaldo Natal, MD 11/29/18 1332

## 2018-11-29 NOTE — ED Notes (Signed)
X-ray at bedside

## 2018-11-29 NOTE — ED Notes (Signed)
Attempted to call Vito Berger at the group home and was told to call Jimmye Norman at [phone] who stated pt would have to be transported by cab back to group home

## 2018-11-29 NOTE — ED Notes (Signed)
Safety sitter, Vernona Rieger EDT, at bedside.

## 2018-11-29 NOTE — ED Notes (Addendum)
Ariel RN attempted to call Nigel Bridgeman (Legal guardian)(Cell [phone]), no answer. Will attempted to get in touch with legal guardian again.

## 2018-11-29 NOTE — ED Notes (Signed)
Went to room to start keppra and do the covid swab- pt was out of bed standing by the bed- when asked what he was doing pt started mumbling words- Jae Dire RN and Darl Pikes RN came to room to help get him back in bed safely and to change his sheets as they were wet

## 2018-11-29 NOTE — ED Provider Notes (Signed)
[facility] Emergency Department Provider Note  ____________________________________________   None    (approximate)  I have reviewed the triage vital signs and the nursing notes.   HISTORY  Chief Complaint Seizures    HPI [name] is a 63 y.o. male presents emergency department via EMS after [place] a [place] [employer].  Patient has not been responsive and is been post ictal but is looking around the room.  Trying to speak but not having appropriate conversation.  Poor historian at this time.    Past Medical History:  Diagnosis Date  . Diabetes mellitus without complication (HCC)   . HTN (hypertension)   . Hyperammonemia (HCC) While on Depakote  . Hyperlipemia   . Lithium toxicity   . Neutropenia (HCC)   . Schizophrenia (HCC)   . Seizure (HCC) While toxic on Depakote  . Sickle cell trait (HCC)   . Thyroid disease   . TIA (transient ischemic attack) in 2009    Patient Active Problem List   Diagnosis Date Noted  . Tardive dyskinesia 11/21/2015  . Noncompliance 11/21/2015  . HTN (hypertension) 10/10/2015  . Dyslipidemia 09/25/2015  . Undifferentiated schizophrenia (HCC)   . Diabetes (HCC) 09/16/2015  . Hypothyroid 09/16/2015    History reviewed. No pertinent surgical history.  Prior to Admission medications   Medication Sig Start Date End Date Taking? Authorizing Provider  amantadine (SYMMETREL) 100 MG capsule Take 1 capsule (100 mg total) by mouth 2 (two) times daily with a meal. 10/04/15  Yes Jimmy FootmanHernandez-Gonzalez, Andrea, MD  aspirin EC 81 MG tablet Take 81 mg by mouth daily.   Yes [provider]  clonazePAM (KLONOPIN) 0.5 MG tablet Take 1 tablet (0.5 mg total) by mouth 2 (two) times daily with a meal. 10/04/15  Yes Jimmy FootmanHernandez-Gonzalez, Andrea, MD  folic acid (FOLVITE) 1 MG tablet Take 1 mg by mouth daily.   Yes [provider]  haloperidol (HALDOL) 10 MG tablet Take 1 tablet (10 mg total) by mouth 2 (two) times daily.  Patient taking differently: Take 12.5 mg by mouth 2 (two) times a day.  10/04/15  Yes Jimmy FootmanHernandez-Gonzalez, Andrea, MD  hydrOXYzine (ATARAX/VISTARIL) 50 MG tablet Take 50 mg by mouth 2 (two) times daily as needed for anxiety.   Yes [provider]  levothyroxine (SYNTHROID, LEVOTHROID) 125 MCG tablet Take 1 tablet (125 mcg total) by mouth daily before breakfast. 10/04/15  Yes Jimmy FootmanHernandez-Gonzalez, Andrea, MD  lisinopril (PRINIVIL,ZESTRIL) 2.5 MG tablet Take 1 tablet (2.5 mg total) by mouth daily. 10/04/15  Yes Jimmy FootmanHernandez-Gonzalez, Andrea, MD  metFORMIN (GLUCOPHAGE) 1000 MG tablet Take 1 tablet (1,000 mg total) by mouth 2 (two) times daily with a meal. 10/04/15  Yes Jimmy FootmanHernandez-Gonzalez, Andrea, MD  OLANZapine zydis (ZYPREXA) 20 MG disintegrating tablet Take 20 mg by mouth 2 (two) times daily.   Yes [provider]  simvastatin (ZOCOR) 20 MG tablet Take 20 mg by mouth at bedtime.   Yes [provider]  vitamin B-12 (CYANOCOBALAMIN) 1000 MCG tablet Take 1,000 mcg by mouth daily.   Yes [provider]  levETIRAcetam (KEPPRA) 500 MG tablet Take 1 tablet (500 mg total) by mouth 2 (two) times daily. 11/29/18   Emily FilbertWilliams, Jonathan E, MD    Allergies Depakote [valproic acid] and Lithium  No family history on file.  Social History Social History   Tobacco Use  . Smoking status: Unknown If Ever Smoked  Substance Use Topics  . Alcohol use: No  . Drug use: Not on file  Review of Systems  Constitutional: No fever/chills, positive seizure with head trauma Eyes: No visual changes. ENT: No sore throat. Respiratory: Denies cough Genitourinary: Negative for dysuria. Musculoskeletal: Negative for back pain. Neurological: Positive for seizure Skin: Negative for rash.    ____________________________________________   PHYSICAL EXAM:  VITAL SIGNS: ED Triage Vitals  Enc Vitals Group     BP      Pulse      Resp      Temp      Temp src      SpO2      Weight       Height      Head Circumference      Peak Flow      Pain Score      Pain Loc      Pain Edu?      Excl. in GC?     Constitutional: Patient appears post ictal, he is able to move his eyes but is not speaking coherently  eyes: Conjunctivae are normal.  Head: Abrasions noted to the forehead Nose: No congestion/rhinnorhea. Mouth/Throat: Mucous membranes are moist.   Neck:  supple no lymphadenopathy noted Cardiovascular: Normal rate, regular rhythm. Heart sounds are normal Respiratory: Normal respiratory effort.  No retractions, lungs c t a  GU: deferred Musculoskeletal: FROM all extremities, warm and well perfused Neurologic: Postictal, eye movement, incoherent speech Skin:  Skin is warm, dry , abrasions noted to the forehead. No rash noted. Psychiatric: Mood and affect are normal. Speech and behavior are normal.  ____________________________________________   LABS (all labs ordered are listed, but only abnormal results are displayed)  Labs Reviewed  COMPREHENSIVE METABOLIC PANEL - Abnormal; Notable for the following components:      Result Value   CO2 20 (*)    Glucose, Bld 194 (*)    Anion gap 17 (*)    All other components within normal limits  CBC WITH DIFFERENTIAL/PLATELET - Abnormal; Notable for the following components:   MCV 78.6 (*)    MCH 25.4 (*)    All other components within normal limits  SARS CORONAVIRUS 2 (HOSPITAL ORDER, PERFORMED IN [facility])  PROTIME-INR  APTT  TROPONIN I  URINE DRUG SCREEN, QUALITATIVE ([facility] ONLY)   ____________________________________________   ____________________________________________  RADIOLOGY  CT of the head and C-spine are both negative  ____________________________________________   PROCEDURES  Procedure(s) performed: EKG tachycardia  Procedures    ____________________________________________   INITIAL IMPRESSION / ASSESSMENT AND PLAN / ED COURSE  Pertinent labs & imaging results that were  available during my care of the patient were reviewed by me and considered in my medical decision making (see chart for details).   Patient 63 year old male [place] via EMS from church after having a seizure.  Patient has been nonverbal since the incident.  He does move his eyes and has been speaking incoherently here in the ED.  Physical exam patient has multiple abrasions on the forehead, appears to be postictal and incoherent.  CT of the head and C-spine EKG Labs ordered   CBC is normal, comprehensive metabolic panel is normal except glucose elevated at 194, troponin is normal, PT PTT are normal, COVID-19 test is negative  CT of the head and C-spine are both negative, chest x-ray is normal  Group home was contacted.  His demeanor was discussed with him.  They state this is his baseline.  The patient had a second seizure while in CT.  Keppra 1.5 IV.  Patient has not  had another seizure in several hours.  He was given fluids and pressors to decrease the heart rate.  Upon recheck he is standing in the exam room folding paper and appears well.  EMS was contacted to take the patient home.  Was discharged in stable condition.  Dr. Mayford Knife and Juliette Alcide has been in to see the patient and agree with treatment.  As part of my medical decision making, I reviewed the following data within the electronic MEDICAL RECORD NUMBER Nursing notes reviewed and incorporated, Labs reviewed see above, Old chart reviewed, Radiograph reviewed CT of the head and C-spine are normal, chest x-ray is normal, Evaluated by EM attending Dr. Mayford Knife and Dr. Darnelle Catalan, Notes from prior ED visits and [place] Controlled Substance Database  ____________________________________________   FINAL CLINICAL IMPRESSION(S) / ED DIAGNOSES  Final diagnoses:  Seizure (HCC)      NEW MEDICATIONS STARTED DURING THIS VISIT:  Discharge Medication List as of 11/29/2018  5:29 PM    START taking these medications   Details  levETIRAcetam (KEPPRA)  500 MG tablet Take 1 tablet (500 mg total) by mouth 2 (two) times daily., Starting Sun 11/29/2018, Normal         Note:  This document was prepared using Dragon voice recognition software and may include unintentional dictation errors.    Faythe Ghee, PA-C 11/29/18 1858    Arnaldo Natal, MD 12/01/18 [phone]

## 2018-11-29 NOTE — ED Notes (Signed)
Pt unable to sign discharge d/t confusion at baseline

## 2018-11-29 NOTE — Consult Note (Addendum)
[facility]   Reason for Consult:  Psych evaluation.   Referring Physician:  Dr Darnelle CatalanMalinda  Patient Identification: [name] MRN:  [id number] Principal Diagnosis: <principal problem not specified> Diagnosis:  Active Problems:   * No active hospital problems. *   Total Time spent with patient: 20 minutes  Subjective:   [name] is a 63 y.o. male patient with a psych h/o Schizophrenia admitted with seizures.  HPI: Psychiatry consult requested for psych evaluation. Copied from consult request: "patient from group home. Had seizure today. Got confused for about 2h after seizure. Per group home, patient is at baseline.  Per chart: Pt [place] via EMS from a church after having a seizure- pt has 3 abrasions on the right side of his face- pt not responding to voice, but eyes are open- pt trying to speak but words not appropriate to situation- pt keeps blinking his eyes.   ED physician's note: "EKG read interpreted by me shows sinus tachycardia rate of 125 normal axis flipped T's in 1 and L with some ST downsloping in V5 and 6 no STEMI.  T waves in 1 and L are similar to one from June 12 of this year"; "CT was finished patient had about 1/32 seizure on the table.  He is finished seizing now.  This is his at least second seizure in the last hour or so we will give him some Keppra IV."  Patient seen in ED room. Sitter is in a room with him due to patients agitation and attempts to leave. Patient is redirectable. Patient appears confused and is not able to participate in meaningful interview. He did not answer any questions. He keeps saying "pardon" and "when do I leave".  It is unclear if the patient`s mental status still altered after recent seizure. Patient`s agitation might be caused by administration of Keppra as well. Per group home, patient was at his mental baseline prior to ED. I attempted to call patient's guardian, Nigel Bridgemanbony King [phone]) - no answer.  Per  Dr. Toni Amendlapacs  Psych consult note from 12/22/2017: "As usual he was very disorganized in his speech and it was often difficult to make sense of what he is saying." - that makes me think that patient is probably still at his mental baseline currently.  I was not able to obtain any past psych history, social history, family history from the patient.  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  Baseline: usually disorganized in his thinking.  When he decompensates he will become very hyper religious agitated and will often stop eating entirely and stop taking his medicine.  Past meds: Haldol, Zyprexa.  Social history: Long-standing life in a group home has a guardian. Nigel Bridgemanbony King [phone])   Medical history: seizure d/o, DM, HTN.   Substance abuse history: None    Risk to Self:  low Risk to Others:  low Prior Inpatient Therapy:  yes Prior Outpatient Therapy:  yes  Past Medical History:  Past Medical History:  Diagnosis Date  . Diabetes mellitus without complication (HCC)   . HTN (hypertension)   . Hyperammonemia (HCC) While on Depakote  . Hyperlipemia   . Lithium toxicity   . Neutropenia (HCC)   . Schizophrenia (HCC)   . Seizure (HCC) While toxic on Depakote  . Sickle cell trait (HCC)   . Thyroid disease   . TIA (transient ischemic attack) in 2009   History reviewed. No pertinent surgical history.  Family History: No family history on file. Family Psychiatric  History: N/A  Social History:  Social History   Substance and Sexual Activity  Alcohol Use No     Social History   Substance and Sexual Activity  Drug Use Not on file    Social History   Socioeconomic History  . Marital status: Single    Spouse name: Not on file  . Number of children: Not on file  . Years of education: Not on file  . Highest education level: Not on file  Occupational History  . Not on file  Social Needs  . Financial resource strain:  Not on file  . Food insecurity:    Worry: Not on file    Inability: Not on file  . Transportation needs:    Medical: Not on file    Non-medical: Not on file  Tobacco Use  . Smoking status: Unknown If Ever Smoked  Substance and Sexual Activity  . Alcohol use: No  . Drug use: Not on file  . Sexual activity: Not on file  Lifestyle  . Physical activity:    Days per week: Not on file    Minutes per session: Not on file  . Stress: Not on file  Relationships  . Social connections:    Talks on phone: Not on file    Gets together: Not on file    Attends religious service: Not on file    Active member of club or organization: Not on file    Attends meetings of clubs or organizations: Not on file    Relationship status: Not on file  Other Topics Concern  . Not on file  Social History Narrative  . Not on file   Additional Social History:    Allergies:   Allergies  Allergen Reactions  . Depakote [Valproic Acid] Other (See Comments)    Reaction:  Hyperammonemia   . Lithium Other (See Comments)    Pt has a prior history of Lithium toxicity.       Labs:  Results for orders placed or performed during the hospital encounter of 11/29/18 (from the past 48 hour(s))  Protime-INR     Status: None   Collection Time: 11/29/18 12:57 PM  Result Value Ref Range   Prothrombin Time 13.9 11.4 - 15.2 seconds   INR 1.1 0.8 - 1.2    Comment: (NOTE) INR goal varies based on device and disease states. Performed at [facility], [address] [place], [place] [address]   APTT     Status: None   Collection Time: 11/29/18 12:57 PM  Result Value Ref Range   aPTT 28 24 - 36 seconds    Comment: Performed at [facility], [address] [place], [place] [address]  Comprehensive metabolic panel     Status: Abnormal   Collection Time: 11/29/18 12:57 PM  Result Value Ref Range   Sodium 140 135 - 145 mmol/L   Potassium 3.5 3.5 - 5.1 mmol/L   Chloride 103 98 - 111 mmol/L    CO2 20 (L) 22 - 32 mmol/L   Glucose, Bld 194 (H) 70 - 99 mg/dL   BUN 12 8 - 23 mg/dL   Creatinine, Ser 3.24 0.61 - 1.24 mg/dL   Calcium 8.9 8.9 - 40.1 mg/dL   Total Protein 7.3 6.5 - 8.1 g/dL   Albumin 4.2 3.5 - 5.0 g/dL   AST 24 15 - 41 U/L   ALT 12 0 - 44 U/L  Alkaline Phosphatase 61 38 - 126 U/L   Total Bilirubin 0.6 0.3 - 1.2 mg/dL   GFR calc non Af Amer >60 >60 mL/min   GFR calc Af Amer >60 >60 mL/min   Anion gap 17 (H) 5 - 15    Comment: Performed at [facility], [address] [place], [place] [address]  CBC with Differential     Status: Abnormal   Collection Time: 11/29/18 12:59 PM  Result Value Ref Range   WBC 4.1 4.0 - 10.5 K/uL   RBC 5.62 4.22 - 5.81 MIL/uL   Hemoglobin 14.3 13.0 - 17.0 g/dL   HCT 35.0 09.3 - 81.8 %   MCV 78.6 (L) 80.0 - 100.0 fL   MCH 25.4 (L) 26.0 - 34.0 pg   MCHC 32.4 30.0 - 36.0 g/dL   RDW 29.9 37.1 - 69.6 %   Platelets 299 150 - 400 K/uL   nRBC 0.0 0.0 - 0.2 %   Neutrophils Relative % 59 %   Neutro Abs 2.5 1.7 - 7.7 K/uL   Lymphocytes Relative 32 %   Lymphs Abs 1.3 0.7 - 4.0 K/uL   Monocytes Relative 7 %   Monocytes Absolute 0.3 0.1 - 1.0 K/uL   Eosinophils Relative 1 %   Eosinophils Absolute 0.0 0.0 - 0.5 K/uL   Basophils Relative 0 %   Basophils Absolute 0.0 0.0 - 0.1 K/uL   Immature Granulocytes 1 %   Abs Immature Granulocytes 0.02 0.00 - 0.07 K/uL    Comment: Performed at [facility], [address] [place], [place] [address]  Troponin I - Add-On to previous collection     Status: None   Collection Time: 11/29/18 12:59 PM  Result Value Ref Range   Troponin I <0.03 <0.03 ng/mL    Comment: Performed at [facility], [address] [place], [place] [address]    No current facility-administered medications for this encounter.    Current Outpatient Medications  Medication Sig Dispense Refill  . amantadine (SYMMETREL) 100 MG capsule Take 1 capsule (100 mg total) by mouth 2 (two) times daily with a  meal. 60 capsule 0  . aspirin EC 81 MG tablet Take 81 mg by mouth daily.    . clonazePAM (KLONOPIN) 0.5 MG tablet Take 1 tablet (0.5 mg total) by mouth 2 (two) times daily with a meal. 60 tablet 0  . folic acid (FOLVITE) 1 MG tablet Take 1 mg by mouth daily.    . haloperidol (HALDOL) 10 MG tablet Take 1 tablet (10 mg total) by mouth 2 (two) times daily. (Patient taking differently: Take 12.5 mg by mouth 2 (two) times a day. ) 60 tablet 0  . hydrOXYzine (ATARAX/VISTARIL) 50 MG tablet Take 50 mg by mouth 2 (two) times daily as needed for anxiety.    Marland Kitchen levothyroxine (SYNTHROID, LEVOTHROID) 125 MCG tablet Take 1 tablet (125 mcg total) by mouth daily before breakfast. 30 tablet 0  . lisinopril (PRINIVIL,ZESTRIL) 2.5 MG tablet Take 1 tablet (2.5 mg total) by mouth daily. 30 tablet 0  . metFORMIN (GLUCOPHAGE) 1000 MG tablet Take 1 tablet (1,000 mg total) by mouth 2 (two) times daily with a meal. 60 tablet 0  . OLANZapine zydis (ZYPREXA) 20 MG disintegrating tablet Take 20 mg by mouth 2 (two) times daily.    . simvastatin (ZOCOR) 20 MG tablet Take 20 mg by mouth at bedtime.    . vitamin B-12 (CYANOCOBALAMIN) 1000 MCG tablet Take 1,000 mcg by mouth daily.  Psychiatric Specialty Exam: Physical Exam  Review of Systems  Neurological: Positive for seizures.    Blood pressure (!) 148/88, pulse (!) 104, temperature 98.7 F (37.1 C), temperature source Oral, resp. rate 20, height  (1.981 m), weight 99.8 kg, SpO2 99 %.Body mass index is 25.42 kg/m.  General Appearance: Disheveled  Eye Contact:  Good  Speech:  "pardon", "when do I leave"  Volume:  Normal  Mood:  Dysphoric  Affect:  Blunt  Thought Process:  Disorganized  Orientation:  Other:  self  Thought Content:  Illogical  Suicidal Thoughts:  No  Homicidal Thoughts:  No  Memory:  NA  Judgement:  Impaired  Insight:  Lacking  Psychomotor Activity:  Increased  Concentration:  Concentration: Poor and Attention Span: Poor  Recall:  NA   Fund of Knowledge:  Poor  Language:  Poor  Akathisia:  No  Handed:    AIMS (if indicated):     Assets:  Housing Social Support  ADL's:   Cognition:  Impaired,  Moderate  Sleep:        Treatment Plan Summary: Daily contact with patient to assess and evaluate symptoms and progress in treatment  Disposition: No evidence of imminent risk to self or others at present.   Patient does not meet criteria for psychiatric inpatient admission.   ASSESSMENT:  63 y.o. male patient with a psych h/o Schizophrenia admitted to ED with seizures. Patient had another seizures during CT, received Keppra in ED, became agitated and wanted to leave. Psych consult requested for evaluation. Patient appears confused which could be due to postictal state, but according to chart review, patient is highly disorganized at baseline. Agitation is a known adverse reaction of Keppra. Patient is not able to participate in meaningful interview, but he was not aggressive or combative, in fact he was redirectable by a sitter. There is not enough evidence that the patient has exacerbation of his psychiatric condition at this time.   Impression: Schizophrenia, chronic - appears at baseline.  Recommendations: -No indication for inpatient psychiatric hospitalization. -continue outpatient psych medications. -can use Ativan  PO/IM Q4h PRN agitation. -if patient recommended for medical admission, team can request a new psych consult to follow the patient while on a medical floor.   Thalia Party, MD 11/29/2018 3:46 PM

## 2018-11-29 NOTE — ED Notes (Signed)
Pt unable to answer triage questions at this time but is following commands better

## 2018-11-29 NOTE — ED Notes (Signed)
Gerilyn Pilgrim EDT at bedside to safety sit. Pt continues to sit up in bed and has to be told not to get up.

## 2018-11-29 NOTE — ED Notes (Signed)
[name] notified that pt on his way back to facility

## 2018-11-29 NOTE — ED Provider Notes (Signed)
EKG read interpreted by me shows sinus tachycardia rate of 125 normal axis flipped T's in 1 and L with some ST downsloping in V5 and 6 no STEMI.  T waves in 1 and L are similar to one from June 12 of this year   Arnaldo Natal, MD 11/29/18 1319

## 2018-12-03 ENCOUNTER — Emergency Department
Admission: EM | Admit: 2018-12-03 | Discharge: 2018-12-10 | Disposition: A | Discharge status: Home / Self Care | Payer: Medicare Other | Attending: Emergency Medicine | Admitting: Emergency Medicine

## 2018-12-03 ENCOUNTER — Other Ambulatory Visit: Payer: Self-pay

## 2018-12-03 ENCOUNTER — Encounter: Payer: Self-pay | Admitting: Emergency Medicine

## 2018-12-03 DIAGNOSIS — Z8673 Personal history of transient ischemic attack (TIA), and cerebral infarction without residual deficits: Secondary | ICD-10-CM | POA: Diagnosis not present

## 2018-12-03 DIAGNOSIS — E039 Hypothyroidism, unspecified: Secondary | ICD-10-CM | POA: Diagnosis not present

## 2018-12-03 DIAGNOSIS — I10 Essential (primary) hypertension: Secondary | ICD-10-CM | POA: Insufficient documentation

## 2018-12-03 DIAGNOSIS — Z7982 Long term (current) use of aspirin: Secondary | ICD-10-CM | POA: Insufficient documentation

## 2018-12-03 DIAGNOSIS — Z79899 Other long term (current) drug therapy: Secondary | ICD-10-CM | POA: Insufficient documentation

## 2018-12-03 DIAGNOSIS — R4689 Other symptoms and signs involving appearance and behavior: Secondary | ICD-10-CM | POA: Diagnosis not present

## 2018-12-03 DIAGNOSIS — F203 Undifferentiated schizophrenia: Secondary | ICD-10-CM | POA: Diagnosis present

## 2018-12-03 DIAGNOSIS — E119 Type 2 diabetes mellitus without complications: Secondary | ICD-10-CM | POA: Diagnosis not present

## 2018-12-03 DIAGNOSIS — F209 Schizophrenia, unspecified: Secondary | ICD-10-CM

## 2018-12-03 LAB — COMPREHENSIVE METABOLIC PANEL
ALT: 23 U/L (ref 0–44)
AST: 74 U/L — ABNORMAL HIGH (ref 15–41)
Albumin: 4.1 g/dL (ref 3.5–5.0)
Alkaline Phosphatase: 60 U/L (ref 38–126)
Anion gap: 10 (ref 5–15)
BUN: 18 mg/dL (ref 8–23)
CO2: 28 mmol/L (ref 22–32)
Calcium: 9.2 mg/dL (ref 8.9–10.3)
Chloride: 102 mmol/L (ref 98–111)
Creatinine, Ser: 1.26 mg/dL — ABNORMAL HIGH (ref 0.61–1.24)
GFR calc Af Amer: >60 mL/min (ref >60)
GFR calc non Af Amer: >60 mL/min (ref >60)
Glucose, Bld: 109 mg/dL — ABNORMAL HIGH (ref 70–99)
Potassium: 4.5 mmol/L (ref 3.5–5.1)
Sodium: 140 mmol/L (ref 135–145)
Total Bilirubin: 1.3 mg/dL — ABNORMAL HIGH (ref 0.3–1.2)
Total Protein: 7.1 g/dL (ref 6.5–8.1)

## 2018-12-03 LAB — CBC
HCT: 40.5 % (ref 39.0–52.0)
Hemoglobin: 13.5 g/dL (ref 13.0–17.0)
MCH: 25.8 pg — ABNORMAL LOW (ref 26.0–34.0)
MCHC: 33.3 g/dL (ref 30.0–36.0)
MCV: 77.3 fL — ABNORMAL LOW (ref 80.0–100.0)
Platelets: 246 10*3/uL (ref 150–400)
RBC: 5.24 MIL/uL (ref 4.22–5.81)
RDW: 13.7 % (ref 11.5–15.5)
WBC: 7.2 10*3/uL (ref 4.0–10.5)
nRBC: 0 % (ref 0.0–0.2)

## 2018-12-03 LAB — ETHANOL: Alcohol, Ethyl (B): <10 mg/dL (ref <10)

## 2018-12-03 LAB — ACETAMINOPHEN LEVEL: Acetaminophen (Tylenol), Serum: <10 ug/mL — ABNORMAL LOW (ref 10–30)

## 2018-12-03 LAB — VALPROIC ACID LEVEL: Valproic Acid Lvl: <10 ug/mL — ABNORMAL LOW (ref 50.0–100.0)

## 2018-12-03 LAB — SALICYLATE LEVEL: Salicylate Lvl: <7 mg/dL (ref 2.8–30.0)

## 2018-12-03 MED ORDER — DIPHENHYDRAMINE HCL 50 MG/ML IJ SOLN
50.0000 mg | Freq: Once | INTRAMUSCULAR | Status: AC
  Administered 2018-12-03: 18:00:00 50 mg via INTRAMUSCULAR
  Filled 2018-12-03: qty 1

## 2018-12-03 MED ORDER — LORAZEPAM 2 MG/ML IJ SOLN
2.0000 mg | Freq: Once | INTRAMUSCULAR | Status: AC
  Administered 2018-12-03: 18:00:00 2 mg via INTRAMUSCULAR
  Filled 2018-12-03: qty 1

## 2018-12-03 MED ORDER — ZIPRASIDONE MESYLATE 20 MG IM SOLR
20.0000 mg | Freq: Once | INTRAMUSCULAR | Status: AC
  Administered 2018-12-03: 20 mg via INTRAMUSCULAR
  Filled 2018-12-03: qty 20

## 2018-12-03 NOTE — ED Notes (Signed)
Pt verbally and physically aggressive on arrival.  Unable to be dressed out.  Vital signs unable to be taken.
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
## 2018-12-03 NOTE — ED Notes (Signed)
Patient sleeping soundly through blood draw with even, unlabored respirations. Patient remains in street clothes. Unable to assist with changing into hospital clothes at this time. Patient wanded by security.
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
## 2018-12-04 DIAGNOSIS — F203 Undifferentiated schizophrenia: Secondary | ICD-10-CM | POA: Diagnosis not present

## 2018-12-04 DIAGNOSIS — R4689 Other symptoms and signs involving appearance and behavior: Secondary | ICD-10-CM

## 2018-12-04 MED ORDER — OLANZAPINE 5 MG PO TBDP
20.0000 mg | ORAL_TABLET | Freq: Two times a day (BID) | ORAL | Status: DC
  Administered 2018-12-04 – 2018-12-08 (×6): 20 mg via ORAL
  Filled 2018-12-04: qty 2
  Filled 2018-12-04: qty 4
  Filled 2018-12-04 (×4): qty 2
  Filled 2018-12-04: qty 4
  Filled 2018-12-04 (×2): qty 2

## 2018-12-04 MED ORDER — HYDROXYZINE HCL 25 MG PO TABS: 50.0000 mg | ORAL_TABLET | Freq: Two times a day (BID) | ORAL | Status: DC | PRN

## 2018-12-04 MED ORDER — CLONAZEPAM 0.5 MG PO TABS: 1.0000 mg | ORAL_TABLET | Freq: Two times a day (BID) | ORAL | Status: DC

## 2018-12-04 MED ORDER — LEVOTHYROXINE SODIUM 125 MCG PO TABS
125.0000 ug | ORAL_TABLET | Freq: Every day | ORAL | Status: DC
  Administered 2018-12-07 – 2018-12-08 (×2): 125 ug via ORAL
  Filled 2018-12-04 (×7): qty 1

## 2018-12-04 MED ORDER — METFORMIN HCL 500 MG PO TABS
1000.0000 mg | ORAL_TABLET | Freq: Two times a day (BID) | ORAL | Status: DC
  Administered 2018-12-05 – 2018-12-10 (×9): 1000 mg via ORAL
  Filled 2018-12-04 (×11): qty 2

## 2018-12-04 MED ORDER — HALOPERIDOL LACTATE 5 MG/ML IJ SOLN
10.0000 mg | Freq: Two times a day (BID) | INTRAMUSCULAR | Status: DC
  Administered 2018-12-04: 10 mg via INTRAMUSCULAR
  Filled 2018-12-04: qty 2

## 2018-12-04 MED ORDER — ASPIRIN EC 81 MG PO TBEC
81.0000 mg | DELAYED_RELEASE_TABLET | Freq: Every day | ORAL | Status: DC
  Administered 2018-12-06 – 2018-12-10 (×4): 81 mg via ORAL
  Filled 2018-12-04 (×6): qty 1

## 2018-12-04 MED ORDER — LEVETIRACETAM 500 MG PO TABS
500.0000 mg | ORAL_TABLET | Freq: Two times a day (BID) | ORAL | Status: DC
  Administered 2018-12-04 – 2018-12-08 (×6): 500 mg via ORAL
  Filled 2018-12-04 (×10): qty 1

## 2018-12-04 MED ORDER — LISINOPRIL 5 MG PO TABS
2.5000 mg | ORAL_TABLET | Freq: Every day | ORAL | Status: DC
  Administered 2018-12-07 – 2018-12-10 (×3): 2.5 mg via ORAL
  Filled 2018-12-04 (×5): qty 1

## 2018-12-04 MED ORDER — FOLIC ACID 1 MG PO TABS
1.0000 mg | ORAL_TABLET | Freq: Every day | ORAL | Status: DC
  Administered 2018-12-05 – 2018-12-10 (×5): 1 mg via ORAL
  Filled 2018-12-04 (×6): qty 1

## 2018-12-04 MED ORDER — SIMVASTATIN 10 MG PO TABS
20.0000 mg | ORAL_TABLET | Freq: Every day | ORAL | Status: DC
  Administered 2018-12-04 – 2018-12-07 (×2): 20 mg via ORAL
  Filled 2018-12-04: qty 2
  Filled 2018-12-04: qty 1
  Filled 2018-12-04 (×2): qty 2
  Filled 2018-12-04: qty 1

## 2018-12-04 MED ORDER — HALOPERIDOL 5 MG PO TABS
10.0000 mg | ORAL_TABLET | Freq: Two times a day (BID) | ORAL | Status: DC
  Administered 2018-12-05 – 2018-12-08 (×7): 10 mg via ORAL
  Filled 2018-12-04 (×7): qty 2

## 2018-12-04 MED ORDER — VITAMIN B-12 1000 MCG PO TABS
1000.0000 ug | ORAL_TABLET | Freq: Every day | ORAL | Status: DC
  Administered 2018-12-05 – 2018-12-10 (×5): 1000 ug via ORAL
  Filled 2018-12-04 (×8): qty 1

## 2018-12-04 MED ORDER — HALOPERIDOL 5 MG PO TABS: 10.0000 mg | ORAL_TABLET | Freq: Two times a day (BID) | ORAL | Status: DC

## 2018-12-04 MED ORDER — AMANTADINE HCL 100 MG PO CAPS
100.0000 mg | ORAL_CAPSULE | Freq: Two times a day (BID) | ORAL | Status: DC
  Administered 2018-12-05 – 2018-12-08 (×6): 100 mg via ORAL
  Filled 2018-12-04 (×10): qty 1

## 2018-12-04 MED ORDER — LORAZEPAM 2 MG/ML IJ SOLN
2.0000 mg | Freq: Two times a day (BID) | INTRAMUSCULAR | Status: DC
  Administered 2018-12-04: 18:00:00 2 mg via INTRAMUSCULAR
  Filled 2018-12-04: qty 1

## 2018-12-04 MED ORDER — CLONAZEPAM 0.5 MG PO TABS: 0.5000 mg | ORAL_TABLET | Freq: Two times a day (BID) | ORAL | Status: DC

## 2018-12-04 MED ORDER — LORAZEPAM 2 MG PO TABS
2.0000 mg | ORAL_TABLET | Freq: Two times a day (BID) | ORAL | Status: DC
  Administered 2018-12-05: 09:00:00 2 mg via ORAL
  Filled 2018-12-04: qty 1

## 2018-12-04 NOTE — ED Provider Notes (Signed)
-----------------------------------------   6:31 AM on 12/04/2018 -----------------------------------------   Height 6\' 7"  (2.007 m), weight 99.8 kg.  The patient is calm and cooperative at this time.  There have been no acute events since the last update.  Awaiting disposition plan from Behavioral Medicine team.   Irean Hong, MD 12/04/18 [phone]
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
## 2018-12-04 NOTE — ED Notes (Signed)
BEHAVIORAL HEALTH ROUNDING Patient sleeping: No. Patient alert and oriented: yes Behavior appropriate: Yes.  ; If no, describe:  Nutrition and fluids offered: yes Toileting and hygiene offered: Yes  Sitter present: q15 minute observations and security monitoring Law enforcement present: Yes    

## 2018-12-04 NOTE — ED Notes (Signed)
Patient in room holding his blanket up at the door.

## 2018-12-04 NOTE — ED Notes (Signed)
explained to patient he needed to change into our scrubs patient states Jesus didn't tell too, patient still not dressed out at this time.

## 2018-12-04 NOTE — ED Notes (Signed)
Asked patient for urine sample patient looks at container and states" you use it"and gives it back.

## 2018-12-04 NOTE — ED Notes (Signed)
IVC/  PENDING  PLACEMENT 

## 2018-12-04 NOTE — ED Notes (Addendum)
Patient continues to display bizarre behavior, not compliant to giving [place] a urine sample and when we asked him to dress out in [employer] attire patient said no and began quoting scriptures, he is very religiously preoccupied. He appears to be disorganized in his thinking. Patient given Haldol and Ativan IM. This medication allowed [place] to assist the patient in changing into the appropriate attire.

## 2018-12-04 NOTE — BH Assessment (Addendum)
Patient referred to [facility], pending review.    State Referral Form and supporting documentation completed and faxed to [employer].   Received the Authorization/Tracking # [id number]) from [employer] ([id number]).   Verbal screening completed with [facility]([phone]), information faxed  and confirmed it was received. Currently pending review.  Patient denied at;  [facility] ([phone])  [facility] ([phone])  [facility] ([phone])  [place] [phone])  [place] ([phone])
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
## 2018-12-05 DIAGNOSIS — F203 Undifferentiated schizophrenia: Secondary | ICD-10-CM | POA: Diagnosis not present

## 2018-12-05 LAB — GLUCOSE, CAPILLARY: Glucose-Capillary: 104 mg/dL — ABNORMAL HIGH (ref 70–99)

## 2018-12-05 MED ORDER — LORAZEPAM 2 MG PO TABS: 2.0000 mg | ORAL_TABLET | Freq: Two times a day (BID) | ORAL | Status: DC | PRN

## 2018-12-05 MED ORDER — LORAZEPAM 2 MG/ML IJ SOLN: 2.0000 mg | Freq: Two times a day (BID) | INTRAMUSCULAR | Status: DC | PRN

## 2018-12-05 NOTE — ED Notes (Signed)
Pt unsteady on his feet, hanging on to wall and bed to walk. VS stable. CBG 104.  Speech mumbled, pt lethargic.  NP and EDP on unit to assess  patient. Maintained on 15 minute checks and observation by security camera for safety.

## 2018-12-05 NOTE — ED Notes (Signed)
Pt very unsteady on his feet, bumping into walls. Charge nurse made aware. Will move patient back to the quad.

## 2018-12-05 NOTE — ED Provider Notes (Signed)
-----------------------------------------   6:45 AM on 12/05/2018 -----------------------------------------   Blood pressure (!) 150/88, pulse (!) 102, temperature 98.4 F (36.9 C), temperature source Oral, resp. rate 18, height 6\' 7"  (2.007 m), weight 99.8 kg, SpO2 100 %.  The patient is calm and cooperative at this time.  There have been no acute events since the last update.  Awaiting disposition plan from Behavioral Medicine team.   Irean Hong, MD 12/05/18 [phone]

## 2018-12-05 NOTE — ED Provider Notes (Signed)
Vitals:   12/04/18 1830 12/05/18 1156  BP: (!) 150/88 114/62  Pulse: (!) 102   Resp: 18   Temp: 98.4 F (36.9 C)   SpO2: 100%     Evaluated the patient is a nurse reported that he seemed to have all more slurred speech and still unsteady with his gait earlier.  My examination is a bit tangential in conversation reporting he is the "son of Jehovah" but his speech is clear and he asked me questions like what, she is I have tells me he likes my shoes.  He has no focal deficits.  He is eating his breakfast with both hands stable. Speaking in sentences with clear wording.   Does not appear in any acute distress.  Moves all extremities well and equally.  Discussed with nurse practitioner Asher Muir, and I suspect that this is symptoms that are improving after he received a dose of Ativan this morning.     Sharyn Creamer, MD 12/05/18 1330

## 2018-12-05 NOTE — ED Notes (Signed)
Patient up to bathroom steady on feet at this time. No behavioral issues, He is safe, will continue to monitor.

## 2018-12-05 NOTE — ED Notes (Signed)
Pt received a dinner tray

## 2018-12-05 NOTE — ED Notes (Signed)
Patient transferred to room 22, he is cooperative, ask for crayons, nurse gave him blanket, and He received crayons,  Amy RN reported to quad nurse regarding patient,  Patient without signs of distress, will continue to monitor.

## 2018-12-05 NOTE — ED Notes (Signed)
Nurse attempted to give patient His bedtime medications and He refused, He mumbled and complained, would not even let nurse see His armband, states ' No No No' Nurse will pass on in report, Patient just lying in bed with light on, nurse let him know she would turn out light and he ask why? Then said " No" will continue to monitor.
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
## 2018-12-05 NOTE — ED Notes (Signed)
Pt ambulatory to toilet, no distress noted

## 2018-12-06 DIAGNOSIS — F203 Undifferentiated schizophrenia: Secondary | ICD-10-CM | POA: Diagnosis not present

## 2018-12-06 NOTE — ED Notes (Signed)
Pt given breakfast tray

## 2018-12-06 NOTE — ED Notes (Signed)
Hourly rounding reveals patient sleeping in room. No complaints, stable, in no acute distress. Q15 minute rounds and monitoring via Security Cameras to continue. 

## 2018-12-06 NOTE — ED Provider Notes (Signed)
-----------------------------------------   12:40 AM on 12/06/2018 -----------------------------------------   Blood pressure 114/62, pulse (!) 102, temperature 98.4 F (36.9 C), temperature source Oral, resp. rate 18, height 6\' 7"  (2.007 m), weight 99.8 kg, SpO2 100 %.  The patient is calm and cooperative at this time.  There have been no acute events since the last update.  Awaiting disposition plan from Behavioral Medicine team.    Willy Eddy, MD 12/06/18 0040

## 2018-12-06 NOTE — ED Notes (Signed)
Patient given am medications.  This RN requested to check patient's blood sugar and patient stated he would rather not at this time.  Patient is in no obvious distress at this time.  Patient is alert and oriented.

## 2018-12-06 NOTE — ED Notes (Signed)
Pt refused to have vital signs checked.AS

## 2018-12-06 NOTE — ED Notes (Signed)
Report to include Situation, Background, Assessment, and Recommendations received from Amy B. RN. Patient alert, warm and dry, in no acute distress. Patient refuses to answer questions concerning SI, HI, AVH and pain. Patient made aware of Q15 minute rounds and security cameras for their safety. Patient instructed to come to me with needs or concerns.

## 2018-12-06 NOTE — ED Notes (Signed)
Patient is awake.  Walking and standing with steady gait.

## 2018-12-06 NOTE — ED Notes (Signed)
Hourly rounding reveals patient in room. No complaints, stable, in no acute distress. Q15 minute rounds and monitoring via [employer] to continue. 

## 2018-12-06 NOTE — ED Notes (Signed)
Offered patient a snack, no response give.AS
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
## 2018-12-07 DIAGNOSIS — F203 Undifferentiated schizophrenia: Secondary | ICD-10-CM | POA: Diagnosis not present

## 2018-12-07 NOTE — ED Notes (Signed)
Report to include Situation, Background, Assessment, and Recommendations received from Jadeka RN. Patient alert, warm and dry, in no acute distress. Patient refuses to answer questions related to SI, HI, AVH and pain. Patient made aware of Q15 minute rounds and security cameras for their safety. Patient instructed to come to me with needs or concerns. 

## 2018-12-07 NOTE — ED Notes (Signed)
Hourly rounding reveals patient in room. No complaints, stable, in no acute distress. Q15 minute rounds and monitoring via [employer] to continue. 

## 2018-12-07 NOTE — ED Notes (Signed)
IVC/  PENDING  PLACEMENT 

## 2018-12-07 NOTE — ED Notes (Signed)
Hourly rounding reveals patient in rest room. No complaints, stable, in no acute distress. Q15 minute rounds and monitoring via Security Cameras to continue. 

## 2018-12-07 NOTE — ED Notes (Signed)
Hourly rounding reveals patient sleeping in room. No complaints, stable, in no acute distress. Q15 minute rounds and monitoring via Security Cameras to continue. 

## 2018-12-07 NOTE — ED Notes (Signed)
IVC/ Pending Inpt vs Placement 

## 2018-12-07 NOTE — ED Provider Notes (Signed)
-----------------------------------------   3:16 AM on 12/07/2018 -----------------------------------------   Blood pressure 132/87, pulse (!) 114, temperature 98.4 F (36.9 C), temperature source Oral, resp. rate 16, height 6\' 7"  (2.007 m), weight 99.8 kg, SpO2 100 %.  The patient is calm and cooperative at this time.  There have been no acute events since the last update.  Awaiting disposition plan from Behavioral Medicine team.    Willy Eddy, MD 12/07/18 [phone]
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
## 2018-12-08 DIAGNOSIS — F203 Undifferentiated schizophrenia: Secondary | ICD-10-CM | POA: Diagnosis not present

## 2018-12-08 DIAGNOSIS — G40909 Epilepsy, unspecified, not intractable, without status epilepticus: Secondary | ICD-10-CM | POA: Diagnosis not present

## 2018-12-08 DIAGNOSIS — R4689 Other symptoms and signs involving appearance and behavior: Secondary | ICD-10-CM | POA: Diagnosis not present

## 2018-12-08 MED ORDER — BENZTROPINE MESYLATE 1 MG PO TABS
1.0000 mg | ORAL_TABLET | Freq: Two times a day (BID) | ORAL | Status: DC
  Filled 2018-12-08 (×2): qty 1

## 2018-12-08 MED ORDER — OLANZAPINE 5 MG PO TBDP
20.0000 mg | ORAL_TABLET | Freq: Every day | ORAL | Status: DC
  Administered 2018-12-09: 20 mg via ORAL
  Filled 2018-12-08: qty 4

## 2018-12-08 MED ORDER — CLONAZEPAM 0.5 MG PO TABS
0.5000 mg | ORAL_TABLET | Freq: Two times a day (BID) | ORAL | Status: DC
  Filled 2018-12-08 (×2): qty 1

## 2018-12-08 MED ORDER — DIVALPROEX SODIUM 125 MG PO CSDR
1000.0000 mg | DELAYED_RELEASE_CAPSULE | Freq: Two times a day (BID) | ORAL | Status: DC
  Filled 2018-12-08 (×3): qty 8

## 2018-12-08 MED ORDER — CLONAZEPAM 0.5 MG PO TABS: 0.5000 mg | ORAL_TABLET | Freq: Two times a day (BID) | ORAL | Status: DC

## 2018-12-08 MED ORDER — HALOPERIDOL 5 MG PO TABS
12.5000 mg | ORAL_TABLET | Freq: Two times a day (BID) | ORAL | Status: DC
  Filled 2018-12-08 (×2): qty 3

## 2018-12-08 NOTE — ED Provider Notes (Signed)
-----------------------------------------   7:03 AM on 12/08/2018 -----------------------------------------   BP 130/78   Pulse (!) 114   Temp 98.4 F (36.9 C) (Oral)   Resp 16   Ht 2.007 m (6\' 7" )   Wt 99.8 kg   SpO2 100%   BMI 24.78 kg/m   No acute events overnight. Vitals reviewed. Patient remains medically cleared.  Disposition is pending per Psychiatry/Behavioral Medicine team recommendations.    Jene Every, MD 12/08/18 [phone]

## 2018-12-08 NOTE — ED Notes (Signed)
Hourly rounding reveals patient sleeping in room. No complaints, stable, in no acute distress. Q15 minute rounds and monitoring via Rover and Officer to continue.  

## 2018-12-08 NOTE — ED Notes (Signed)
Had to coaxed Patient to take medications, He ask questions that were not sensible, He remains calm, will continue to monitor.

## 2018-12-08 NOTE — ED Notes (Signed)
Hourly rounding reveals patient in room. No complaints, stable, in no acute distress. Q15 minute rounds and monitoring via Rover and Officer to continue.   

## 2018-12-08 NOTE — ED Notes (Signed)
Nurse attempted to get vital signs and Patient refused at this time.

## 2018-12-08 NOTE — ED Notes (Signed)
Patient ate 100% of breakfast. °

## 2018-12-08 NOTE — ED Notes (Signed)
Pt went to the bathroom at 9:37 pm.

## 2018-12-08 NOTE — ED Notes (Signed)
Patient ate 100% of supper, and beverage, no signs of distress.  

## 2018-12-08 NOTE — ED Notes (Signed)
Patient came up to desk and ask for Bible, gave him Bible, He is calm and cooperative, no signs of distress at this time, no behavioral issues, denies Si/hi or avh, patient can speak intelligently at times, and then will start speaking gibberish, will continue to monitor.

## 2018-12-08 NOTE — ED Notes (Signed)
Report to include Situation, Background, Assessment, and Recommendations received from [facility]. Patient alert, warm and dry, in no acute distress. Patient refuses to answer questions related to SI, HI, AVH and pain. Patient made aware of Q15 minute rounds and Psychologist, counselling presence for their safety. Patient instructed to come to me with needs or concerns.
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
## 2018-12-08 NOTE — ED Notes (Signed)
Nurse attempted to obtain v/s and patient would not let nurse have arm from under the cover, no signs of distress.

## 2018-12-08 NOTE — ED Notes (Signed)
Hourly rounding reveals patient sleeping in room. No complaints, stable, in no acute distress. Q15 minute rounds and monitoring via Security Cameras to continue. 

## 2018-12-09 DIAGNOSIS — R4689 Other symptoms and signs involving appearance and behavior: Secondary | ICD-10-CM | POA: Diagnosis not present

## 2018-12-09 DIAGNOSIS — Z9114 Patient's other noncompliance with medication regimen: Secondary | ICD-10-CM | POA: Diagnosis not present

## 2018-12-09 DIAGNOSIS — F203 Undifferentiated schizophrenia: Secondary | ICD-10-CM | POA: Diagnosis not present

## 2018-12-09 DIAGNOSIS — G40909 Epilepsy, unspecified, not intractable, without status epilepticus: Secondary | ICD-10-CM | POA: Diagnosis not present

## 2018-12-09 MED ORDER — OLANZAPINE 10 MG PO TABS
30.0000 mg | ORAL_TABLET | ORAL | Status: AC
  Administered 2018-12-09: 30 mg via ORAL
  Filled 2018-12-09: qty 3

## 2018-12-09 MED ORDER — DIVALPROEX SODIUM 125 MG PO CSDR
1000.0000 mg | DELAYED_RELEASE_CAPSULE | Freq: Two times a day (BID) | ORAL | Status: DC
  Administered 2018-12-09 – 2018-12-10 (×2): 1000 mg via ORAL
  Filled 2018-12-09 (×4): qty 8

## 2018-12-09 MED ORDER — CLONAZEPAM 0.5 MG PO TABS
0.5000 mg | ORAL_TABLET | Freq: Two times a day (BID) | ORAL | Status: DC
  Administered 2018-12-10: 10:00:00 0.5 mg via ORAL
  Filled 2018-12-09 (×2): qty 1

## 2018-12-09 MED ORDER — HALOPERIDOL 5 MG PO TABS
12.5000 mg | ORAL_TABLET | Freq: Two times a day (BID) | ORAL | Status: DC
  Administered 2018-12-10: 12.5 mg via ORAL
  Filled 2018-12-09 (×2): qty 3

## 2018-12-09 MED ORDER — BENZTROPINE MESYLATE 1 MG PO TABS
1.0000 mg | ORAL_TABLET | Freq: Two times a day (BID) | ORAL | Status: DC
  Administered 2018-12-10: 10:00:00 1 mg via ORAL
  Filled 2018-12-09 (×2): qty 1

## 2018-12-09 NOTE — ED Notes (Signed)
Pt. Currently sleeping on bed in room. 

## 2018-12-09 NOTE — ED Notes (Signed)
Hourly rounding reveals patient sleeping in room. No complaints, stable, in no acute distress. Q15 minute rounds and monitoring via Security to continue. 

## 2018-12-09 NOTE — ED Notes (Signed)
Hourly rounding reveals patient sleeping in room. No complaints, stable, in no acute distress. Q15 minute rounds and monitoring via Rover and Officer to continue.  

## 2018-12-09 NOTE — ED Notes (Signed)
Hourly rounding reveals patient in room. No complaints, stable, in no acute distress. Q15 minute rounds and monitoring via Rover and Officer to continue.   

## 2018-12-09 NOTE — ED Notes (Addendum)
Attempted to place patients medications crushed in his sandwich mixed with mayonnaise and mustard per the psychiatrist. Patient did not eat the sandwich left it on the tray. Per patients group home he takes his medication crushed in a peanut butter and jelly sandwich   Psychiatrist ordered Zyprexa 30mg  patient took orally when writer asked if she could  pour the pills in his mouth, he opened his mouth
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
## 2018-12-09 NOTE — ED Notes (Signed)
Offered pt breakfast and pt continued to sleep. 

## 2018-12-09 NOTE — ED Notes (Signed)
Attempted to give patient medications PO with soda, since patient did well earlier taking Prozac with soda, when offered patient the medications he asked "why", nurse discussed with him the benefits of taking medicine and patient said how do you know im well. Patient then went on to ask nurse questions about the medications. Patient refused and begin talking about the Bible.

## 2018-12-09 NOTE — ED Provider Notes (Signed)
-----------------------------------------   7:03 AM on 12/09/2018 -----------------------------------------   Blood pressure 130/78, pulse (!) 114, temperature 98.4 F (36.9 C), temperature source Oral, resp. rate 16, height 6\' 7"  (2.007 m), weight 99.8 kg, SpO2 100 %.  The patient had no acute events since last update.  Calm and cooperative at this time.  Disposition is pending per Psychiatry/Behavioral Medicine team recommendations.     Don Perking, [place], MD 12/09/18 [phone]

## 2018-12-09 NOTE — ED Notes (Signed)
Patients Zyprexa was crushed and put in Peanut butter and jelly sandwich.  Will observe if patient consumes sandwich.  Pt. Calm and cooperative with this nurse.  Pt. Appears to only want to talk about religion.  Pt. Stops talking or appears confused if conversation turns to other subjects besides religion.

## 2018-12-09 NOTE — ED Notes (Signed)
Hourly rounding reveals patient sleeping in room. No complaints, stable, in no acute distress. Q15 minute rounds and monitoring via Security Cameras to continue. 

## 2018-12-09 NOTE — ED Notes (Signed)
Lab technician went into patients room and introduced himself and to draw patients Amonia lab and patient would not allow technician to get his blood, patient kept asking "how would I know" Patient Refused

## 2018-12-10 DIAGNOSIS — F203 Undifferentiated schizophrenia: Secondary | ICD-10-CM | POA: Diagnosis not present

## 2018-12-10 DIAGNOSIS — G40909 Epilepsy, unspecified, not intractable, without status epilepticus: Secondary | ICD-10-CM | POA: Diagnosis not present

## 2018-12-10 LAB — VALPROIC ACID LEVEL: Valproic Acid Lvl: <10 ug/mL — ABNORMAL LOW (ref 50.0–100.0)

## 2018-12-10 MED ORDER — OLANZAPINE 20 MG PO TBDP: 20.0000 mg | ORAL_TABLET | Freq: Two times a day (BID) | ORAL | 2 refills | Status: DC

## 2018-12-10 MED ORDER — BENZTROPINE MESYLATE 1 MG PO TABS: 1.0000 mg | ORAL_TABLET | Freq: Two times a day (BID) | ORAL | 2 refills | Status: DC

## 2018-12-10 MED ORDER — DIVALPROEX SODIUM 125 MG PO CSDR: 1000.0000 mg | DELAYED_RELEASE_CAPSULE | Freq: Two times a day (BID) | ORAL | 2 refills | Status: DC

## 2018-12-10 MED ORDER — HALOPERIDOL 10 MG PO TABS: 15.0000 mg | ORAL_TABLET | Freq: Two times a day (BID) | ORAL | 2 refills | Status: DC

## 2018-12-10 MED ORDER — DIVALPROEX SODIUM 125 MG PO CSDR: 1000.0000 mg | DELAYED_RELEASE_CAPSULE | Freq: Two times a day (BID) | ORAL | 0 refills | Status: DC

## 2018-12-10 NOTE — ED Notes (Signed)
BEHAVIORAL HEALTH ROUNDING Patient sleeping: No. Patient alert and oriented: yes Behavior appropriate: Yes.  ; If no, describe:  Nutrition and fluids offered: yes Toileting and hygiene offered: Yes  Sitter present: q15 minute observations and security  monitoring Law enforcement present: Yes  ODS  

## 2018-12-10 NOTE — ED Notes (Signed)
BEHAVIORAL HEALTH ROUNDING Patient sleeping: No. Patient alert : yes Behavior appropriate: Yes.  ; If no, describe:  Nutrition and fluids offered: yes Toileting and hygiene offered: Yes  Sitter present: q15 minute observations and security monitoring Law enforcement present: Yes  ODS  
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
## 2018-12-10 NOTE — BH Assessment (Signed)
Writer and Psych MD (Dr. Viviano Simas) spoke with the patient's guardian Markham Jordan [phone]) and he can go to back to the Group Home via taxi. Psych MD updated patient's nurse.

## 2018-12-10 NOTE — Discharge Instructions (Signed)
-  Check Depakote level and ammonia levels by outpatient provider, as hyperammonemia is listed as an allergy to Depakote.

## 2018-12-10 NOTE — BH Assessment (Signed)
Confirmed with [facility] ([phone]), patient remain on their Waitlist.

## 2018-12-10 NOTE — ED Provider Notes (Signed)
-----------------------------------------   2:56 PM on 12/10/2018 -----------------------------------------  Patient evaluated by psychiatry who finds him stable for discharge at this point.  Awaiting check of ammonia level and Depakote level prior to discharge to ensure he is not having complications from Depakote metabolism.  Discontinue Keppra as recommended by psychiatry, start Depakote sprinkles.   Sharman Cheek, MD 12/10/18 [phone]

## 2018-12-10 NOTE — BH Assessment (Signed)
Writer received phone call from patient's Group [name]). Updated her about patient getting discharged. However, waiting to talk with the patient's guardian. Writer updated Psych MD (Dr. Viviano Simas) and patient's nurse (Amy T.).

## 2018-12-10 NOTE — BH Assessment (Signed)
Writer called and left a HIPPA Compliant message with Group Home [name] [phone]), requesting a return phone call.

## 2018-12-10 NOTE — ED Notes (Signed)

## 2018-12-10 NOTE — ED Notes (Signed)
Pt observed  - awake sitting up in bed  I introduced myself to him and he waved at me   Pt visualized with NAD  No verbalized needs or concerns at this time  Continue to monitor

## 2018-12-10 NOTE — ED Notes (Signed)
IVC  PAPERS  RESCINDED  PER  DR  Viviano Simas MD  INFORMED  RN  AMY T AND ODS  OFFICER

## 2018-12-10 NOTE — BH Assessment (Signed)
Writer called and left a HIPPA Compliant message with [facility] DSS Guardian [name] [phone]), requesting a return phone call.

## 2018-12-10 NOTE — ED Notes (Signed)
Pt visualized taking his medications  - whole with ginger ale   meds administered as ordered  Assessment completed   He is lying in bed watching football on TV

## 2018-12-10 NOTE — ED Notes (Signed)
ED  Is the patient under IVC or is there intent for IVC: Yes.   Is the patient medically cleared: Yes.   Is there vacancy in the ED BHU: Yes.   Is the population mix appropriate for patient: Yes.   Is the patient awaiting placement in inpatient or outpatient setting: Yes  Awaiting placement at [facility]?   Has the patient had a psychiatric consult: Yes.   Survey of unit performed for contraband, proper placement and condition of furniture, tampering with fixtures in bathroom, shower, and each patient room: Yes.  ; Findings:  APPEARANCE/BEHAVIOR Calm and cooperative NEURO ASSESSMENT Orientation: oriented to self and place   Denies pain Hallucinations: No.None noted (Hallucinations)  Denies at present  Speech: Normal Gait: normal RESPIRATORY ASSESSMENT Even  Unlabored respirations  CARDIOVASCULAR ASSESSMENT Pulses equal   regular rate  Skin warm and dry   GASTROINTESTINAL ASSESSMENT no GI complaint EXTREMITIES Full ROM  PLAN OF CARE Provide calm/safe environment. Vital signs assessed twice daily. ED BHU Assessment once each 12-hour shift. Collaborate with [name] daily or as condition indicates. Assure the ED provider has rounded once each shift. Provide and encourage hygiene. Provide redirection as needed. Assess for escalating behavior; address immediately and inform ED provider.  Assess family dynamic and appropriateness for visitation as needed: Yes.  ; If necessary, describe findings:  Educate the patient/family about BHU procedures/visitation: Yes.  ; If necessary, describe findings:

## 2018-12-10 NOTE — ED Notes (Signed)
BEHAVIORAL HEALTH ROUNDING Patient sleeping: Yes.   Patient alert and oriented: eyes closed  Appears asleep Behavior appropriate: Yes.  ; If no, describe:  Nutrition and fluids offered: Yes  Toileting and hygiene offered: sleeping Sitter present: q 15 minute observations and security monitoring Law enforcement present: yes  ODS 

## 2018-12-10 NOTE — ED Notes (Signed)
Pt discharged home via cab. VS stable. Pt denies SI/HI. Denies pain. Discharge instructions given to cab driver to ensure they get to  group home with patient.

## 2019-08-18 ENCOUNTER — Inpatient Hospital Stay
Admission: EM | Admit: 2019-08-18 | Discharge: 2019-08-25 | DRG: 101 | Disposition: A | Discharge status: Home / Self Care | Payer: Medicare Other | Attending: Hospitalist | Admitting: Hospitalist

## 2019-08-18 ENCOUNTER — Emergency Department: Payer: Medicare Other

## 2019-08-18 ENCOUNTER — Other Ambulatory Visit: Payer: Self-pay

## 2019-08-18 DIAGNOSIS — F203 Undifferentiated schizophrenia: Secondary | ICD-10-CM

## 2019-08-18 DIAGNOSIS — R569 Unspecified convulsions: Secondary | ICD-10-CM

## 2019-08-18 DIAGNOSIS — Z8673 Personal history of transient ischemic attack (TIA), and cerebral infarction without residual deficits: Secondary | ICD-10-CM

## 2019-08-18 DIAGNOSIS — Z79890 Hormone replacement therapy (postmenopausal): Secondary | ICD-10-CM

## 2019-08-18 DIAGNOSIS — Z7982 Long term (current) use of aspirin: Secondary | ICD-10-CM

## 2019-08-18 DIAGNOSIS — R451 Restlessness and agitation: Secondary | ICD-10-CM

## 2019-08-18 DIAGNOSIS — N179 Acute kidney failure, unspecified: Secondary | ICD-10-CM

## 2019-08-18 DIAGNOSIS — E785 Hyperlipidemia, unspecified: Secondary | ICD-10-CM

## 2019-08-18 DIAGNOSIS — F209 Schizophrenia, unspecified: Secondary | ICD-10-CM

## 2019-08-18 DIAGNOSIS — Z7984 Long term (current) use of oral hypoglycemic drugs: Secondary | ICD-10-CM

## 2019-08-18 DIAGNOSIS — R000 Tachycardia, unspecified: Secondary | ICD-10-CM

## 2019-08-18 DIAGNOSIS — R7303 Prediabetes: Secondary | ICD-10-CM

## 2019-08-18 DIAGNOSIS — E119 Type 2 diabetes mellitus without complications: Secondary | ICD-10-CM | POA: Diagnosis not present

## 2019-08-18 DIAGNOSIS — Z5320 Procedure and treatment not carried out because of patient's decision for unspecified reasons: Secondary | ICD-10-CM | POA: Diagnosis present

## 2019-08-18 DIAGNOSIS — R21 Rash and other nonspecific skin eruption: Secondary | ICD-10-CM

## 2019-08-18 DIAGNOSIS — I10 Essential (primary) hypertension: Secondary | ICD-10-CM | POA: Diagnosis not present

## 2019-08-18 DIAGNOSIS — Z91199 Patient's noncompliance with other medical treatment and regimen due to unspecified reason: Secondary | ICD-10-CM

## 2019-08-18 DIAGNOSIS — G40909 Epilepsy, unspecified, not intractable, without status epilepticus: Secondary | ICD-10-CM | POA: Diagnosis not present

## 2019-08-18 DIAGNOSIS — Z9119 Patient's noncompliance with other medical treatment and regimen: Secondary | ICD-10-CM

## 2019-08-18 DIAGNOSIS — E039 Hypothyroidism, unspecified: Secondary | ICD-10-CM | POA: Diagnosis not present

## 2019-08-18 DIAGNOSIS — Z20822 Contact with and (suspected) exposure to covid-19: Secondary | ICD-10-CM | POA: Diagnosis present

## 2019-08-18 DIAGNOSIS — D573 Sickle-cell trait: Secondary | ICD-10-CM | POA: Diagnosis present

## 2019-08-18 DIAGNOSIS — Z9114 Patient's other noncompliance with medication regimen: Secondary | ICD-10-CM

## 2019-08-18 DIAGNOSIS — Z79899 Other long term (current) drug therapy: Secondary | ICD-10-CM

## 2019-08-18 LAB — COMPREHENSIVE METABOLIC PANEL
ALT: 11 U/L (ref 0–44)
AST: 25 U/L (ref 15–41)
Albumin: 4.1 g/dL (ref 3.5–5.0)
Alkaline Phosphatase: 50 U/L (ref 38–126)
Anion gap: 17 — ABNORMAL HIGH (ref 5–15)
BUN: 16 mg/dL (ref 8–23)
CO2: 18 mmol/L — ABNORMAL LOW (ref 22–32)
Calcium: 9 mg/dL (ref 8.9–10.3)
Chloride: 104 mmol/L (ref 98–111)
Creatinine, Ser: 1.28 mg/dL — ABNORMAL HIGH (ref 0.61–1.24)
GFR calc Af Amer: >60 mL/min (ref >60)
GFR calc non Af Amer: 59 mL/min — ABNORMAL LOW (ref >60)
Glucose, Bld: 191 mg/dL — ABNORMAL HIGH (ref 70–99)
Potassium: 3.7 mmol/L (ref 3.5–5.1)
Sodium: 139 mmol/L (ref 135–145)
Total Bilirubin: 1 mg/dL (ref 0.3–1.2)
Total Protein: 7.3 g/dL (ref 6.5–8.1)

## 2019-08-18 LAB — CBC WITH DIFFERENTIAL/PLATELET
Abs Immature Granulocytes: 0.03 10*3/uL (ref 0.00–0.07)
Basophils Absolute: 0 10*3/uL (ref 0.0–0.1)
Basophils Relative: 0 %
Eosinophils Absolute: 0.1 10*3/uL (ref 0.0–0.5)
Eosinophils Relative: 1 %
HCT: 42.5 % (ref 39.0–52.0)
Hemoglobin: 14.1 g/dL (ref 13.0–17.0)
Immature Granulocytes: 1 %
Lymphocytes Relative: 32 %
Lymphs Abs: 1.9 10*3/uL (ref 0.7–4.0)
MCH: 26.5 pg (ref 26.0–34.0)
MCHC: 33.2 g/dL (ref 30.0–36.0)
MCV: 79.7 fL — ABNORMAL LOW (ref 80.0–100.0)
Monocytes Absolute: 0.4 10*3/uL (ref 0.1–1.0)
Monocytes Relative: 6 %
Neutro Abs: 3.6 10*3/uL (ref 1.7–7.7)
Neutrophils Relative %: 60 %
Platelets: 263 10*3/uL (ref 150–400)
RBC: 5.33 MIL/uL (ref 4.22–5.81)
RDW: 13.9 % (ref 11.5–15.5)
WBC: 5.9 10*3/uL (ref 4.0–10.5)
nRBC: 0 % (ref 0.0–0.2)

## 2019-08-18 LAB — VALPROIC ACID LEVEL: Valproic Acid Lvl: <10 ug/mL — ABNORMAL LOW (ref 50.0–100.0)

## 2019-08-18 LAB — GLUCOSE, CAPILLARY: Glucose-Capillary: 132 mg/dL — ABNORMAL HIGH (ref 70–99)

## 2019-08-18 MED ORDER — LEVOTHYROXINE SODIUM 25 MCG PO TABS
125.0000 ug | ORAL_TABLET | Freq: Every day | ORAL | Status: DC
  Administered 2019-08-19 – 2019-08-24 (×2): 125 ug via ORAL
  Filled 2019-08-18 (×5): qty 1

## 2019-08-18 MED ORDER — HALOPERIDOL 5 MG PO TABS
15.0000 mg | ORAL_TABLET | Freq: Two times a day (BID) | ORAL | Status: DC
  Administered 2019-08-19 – 2019-08-24 (×10): 15 mg via ORAL
  Filled 2019-08-18 (×14): qty 3

## 2019-08-18 MED ORDER — SIMVASTATIN 20 MG PO TABS
20.0000 mg | ORAL_TABLET | Freq: Every day | ORAL | Status: DC
  Administered 2019-08-18 – 2019-08-23 (×2): 20 mg via ORAL
  Filled 2019-08-18: qty 1
  Filled 2019-08-18: qty 2

## 2019-08-18 MED ORDER — CLONAZEPAM 0.5 MG PO TABS
0.5000 mg | ORAL_TABLET | Freq: Two times a day (BID) | ORAL | Status: DC
  Administered 2019-08-19 – 2019-08-24 (×10): 0.5 mg via ORAL
  Filled 2019-08-18 (×12): qty 1

## 2019-08-18 MED ORDER — LORAZEPAM 2 MG/ML IJ SOLN: 2.0000 mg | INTRAMUSCULAR | Status: DC | PRN

## 2019-08-18 MED ORDER — ONDANSETRON HCL 4 MG/2ML IJ SOLN: 4.0000 mg | Freq: Four times a day (QID) | INTRAMUSCULAR | Status: DC | PRN

## 2019-08-18 MED ORDER — DIVALPROEX SODIUM 125 MG PO CSDR
1000.0000 mg | DELAYED_RELEASE_CAPSULE | Freq: Once | ORAL | Status: AC
  Administered 2019-08-18: 18:00:00 1000 mg via ORAL
  Filled 2019-08-18: qty 8

## 2019-08-18 MED ORDER — LISINOPRIL 5 MG PO TABS
2.5000 mg | ORAL_TABLET | Freq: Every day | ORAL | Status: DC
  Administered 2019-08-18: 21:00:00 2.5 mg via ORAL
  Filled 2019-08-18: qty 1

## 2019-08-18 MED ORDER — DEXTROSE-NACL 5-0.9 % IV SOLN: INTRAVENOUS | Status: DC

## 2019-08-18 MED ORDER — ENOXAPARIN SODIUM 40 MG/0.4ML ~~LOC~~ SOLN
40.0000 mg | SUBCUTANEOUS | Status: DC
  Filled 2019-08-18 (×2): qty 0.4

## 2019-08-18 MED ORDER — SODIUM CHLORIDE 0.9 % IV BOLUS
1000.0000 mL | Freq: Once | INTRAVENOUS | Status: AC
  Administered 2019-08-18: 1000 mL via INTRAVENOUS

## 2019-08-18 MED ORDER — HALOPERIDOL LACTATE 5 MG/ML IJ SOLN
5.0000 mg | Freq: Once | INTRAMUSCULAR | Status: AC
  Administered 2019-08-18: 22:00:00 5 mg via INTRAVENOUS
  Filled 2019-08-18: qty 1

## 2019-08-18 MED ORDER — DIVALPROEX SODIUM 125 MG PO CSDR
1000.0000 mg | DELAYED_RELEASE_CAPSULE | Freq: Two times a day (BID) | ORAL | Status: DC
  Administered 2019-08-19: 17:00:00 1000 mg via ORAL
  Filled 2019-08-18 (×3): qty 8

## 2019-08-18 MED ORDER — OLANZAPINE 10 MG PO TBDP
20.0000 mg | ORAL_TABLET | Freq: Two times a day (BID) | ORAL | Status: DC
  Administered 2019-08-19 – 2019-08-24 (×10): 20 mg via ORAL
  Filled 2019-08-18 (×14): qty 2

## 2019-08-18 MED ORDER — LORAZEPAM 2 MG/ML IJ SOLN
2.0000 mg | Freq: Once | INTRAMUSCULAR | Status: AC
  Administered 2019-08-18: 19:00:00 2 mg via INTRAVENOUS

## 2019-08-18 MED ORDER — HYDROXYZINE HCL 50 MG PO TABS
50.0000 mg | ORAL_TABLET | Freq: Two times a day (BID) | ORAL | Status: DC | PRN
  Administered 2019-08-20: 50 mg via ORAL
  Filled 2019-08-18 (×2): qty 1

## 2019-08-18 MED ORDER — VITAMIN B-12 1000 MCG PO TABS
1000.0000 ug | ORAL_TABLET | Freq: Every day | ORAL | Status: DC
  Administered 2019-08-22 – 2019-08-24 (×3): 1000 ug via ORAL
  Filled 2019-08-18 (×7): qty 1

## 2019-08-18 MED ORDER — ASPIRIN EC 81 MG PO TBEC
81.0000 mg | DELAYED_RELEASE_TABLET | Freq: Every day | ORAL | Status: DC
  Administered 2019-08-18 – 2019-08-24 (×4): 81 mg via ORAL
  Filled 2019-08-18 (×8): qty 1

## 2019-08-18 MED ORDER — FOLIC ACID 1 MG PO TABS
1.0000 mg | ORAL_TABLET | Freq: Every day | ORAL | Status: DC
  Administered 2019-08-18 – 2019-08-24 (×4): 1 mg via ORAL
  Filled 2019-08-18 (×8): qty 1

## 2019-08-18 MED ORDER — LORAZEPAM 2 MG/ML IJ SOLN
INTRAMUSCULAR | Status: AC
  Filled 2019-08-18: qty 1

## 2019-08-18 MED ORDER — INSULIN ASPART 100 UNIT/ML ~~LOC~~ SOLN: 0.0000 [IU] | Freq: Three times a day (TID) | SUBCUTANEOUS | Status: DC

## 2019-08-18 MED ORDER — ONDANSETRON HCL 4 MG PO TABS: 4.0000 mg | ORAL_TABLET | Freq: Four times a day (QID) | ORAL | Status: DC | PRN

## 2019-08-18 MED ORDER — BENZTROPINE MESYLATE 1 MG PO TABS
1.0000 mg | ORAL_TABLET | Freq: Two times a day (BID) | ORAL | Status: DC
  Administered 2019-08-19 – 2019-08-24 (×10): 1 mg via ORAL
  Filled 2019-08-18 (×14): qty 1

## 2019-08-18 NOTE — H&P (Signed)
History and Physical   Mcguire Gasparyan OYD:[id number] DOB: 20-Nov-1955 DOA: 08/18/2019  Referring MD/NP/PA: Dr. Royden Purl  PCP: [facility]   Outpatient Specialists: None  Patient coming from: Group home  Chief Complaint: Seizure  HPI: [name] is a 64 y.o. male with medical history significant of seizure disorder, schizophrenia, diabetes, hypertension, hyperammonemia, history of neutropenia, medication noncompliance who was brought in from a group home with 2 episodes of seizures today.  Patient has been on Depakote for his seizure.  He is currently postictal not able to give me any history.  He was agitated earlier on requiring a sitter.  Patient has been on clonazepam as well.  His Depakote level was notably low indicating poor compliance.  Due to recurrence of his seizures and multiple seizures today he is being admitted to the hospital for inpatient treatment.  ED Course: Temperature 99 [name] pressure 1 67/97 pulse 156 respiratory rate of 36 and oxygen sat 93% room air.  CBC appears to be within normal.  Chemistry showed a creatinine 1.28 and CO2 of 18.  91 and calcium 9.0.  COVID-19 screen is negative.  Head CT without contrast is negative.  EKG shows sinus tachycardia with no significant findings.  Patient being admitted to the hospital for treatment of seizure with Depakote level of less than 10.  He has been loaded with Depakote in the ER  Review of Systems: As per HPI otherwise 10 point review of systems negative.    Past Medical History:  Diagnosis Date  . Diabetes mellitus without complication ([place])   . HTN (hypertension)   . Hyperammonemia (HCC) While on Depakote  . Hyperlipemia   . Lithium toxicity   . Neutropenia ([place])   . Schizophrenia ([place])   . Seizure ([place]) While toxic on Depakote  . Sickle cell trait ([place])   . Thyroid disease   . TIA (transient ischemic attack) in 2009    History reviewed. No pertinent surgical history.   reports that he  does not drink alcohol. No history on file for tobacco and drug.  Allergies  Allergen Reactions  . Depakote [Valproic Acid] Other (See Comments)    Reaction:  Hyperammonemia   . Lithium Other (See Comments)    Pt has a prior history of Lithium toxicity.       History reviewed. No pertinent family history.   Prior to Admission medications   Medication Sig Start Date End Date Taking? Authorizing Provider  aspirin EC 81 MG tablet Take 81 mg by mouth daily.   Yes [provider]  benztropine (COGENTIN) 1 MG tablet Take 1 tablet (1 mg total) by mouth 2 (two) times daily with a meal. 12/10/18  Yes Lavella Hammock, MD  clonazePAM (KLONOPIN) 0.5 MG tablet Take 1 tablet (0.5 mg total) by mouth 2 (two) times daily with a meal. 10/04/15  Yes Hildred Priest, MD  divalproex (DEPAKOTE SPRINKLE) 125 MG capsule Take 8 capsules (1,000 mg total) by mouth 2 (two) times daily with a meal. 12/10/18  Yes Lavella Hammock, MD  folic acid (FOLVITE) 1 MG tablet Take 1 mg by mouth daily.   Yes [provider]  haloperidol (HALDOL) 10 MG tablet Take 1.5 tablets (15 mg total) by mouth 2 (two) times daily with a meal. 12/10/18  Yes Lavella Hammock, MD  hydrOXYzine (ATARAX/VISTARIL) 50 MG tablet Take 50 mg by mouth 2 (two) times daily as needed for anxiety.   Yes [provider]  levothyroxine (SYNTHROID, LEVOTHROID) 125  MCG tablet Take 1 tablet (125 mcg total) by mouth daily before breakfast. 10/04/15  Yes Jimmy Footman, MD  lisinopril (PRINIVIL,ZESTRIL) 2.5 MG tablet Take 1 tablet (2.5 mg total) by mouth daily. 10/04/15  Yes Jimmy Footman, MD  metFORMIN (GLUCOPHAGE) 1000 MG tablet Take 1 tablet (1,000 mg total) by mouth 2 (two) times daily with a meal. 10/04/15  Yes Jimmy Footman, MD  OLANZapine zydis (ZYPREXA) 20 MG disintegrating tablet Take 1 tablet (20 mg total) by mouth 2 (two) times daily with a meal. 12/10/18  Yes Mariel Craft, MD    simvastatin (ZOCOR) 20 MG tablet Take 20 mg by mouth at bedtime.   Yes [provider]  vitamin B-12 (CYANOCOBALAMIN) 1000 MCG tablet Take 1,000 mcg by mouth daily.   Yes [provider]    Physical Exam: Vitals:   08/18/19 2120 08/18/19 2140 08/18/19 2200 08/18/19 2341  BP: (!) 141/87 (!) 163/97 (!) 144/94 123/72  Pulse: 96 (!) 125 94 (!) 111  Resp: (!) 21 14 19    Temp:    99 F (37.2 C)  TempSrc:    Oral  SpO2: 99% 100% 99% 93%  Weight:      Height:          Constitutional: Obtunded unresponsive pulse ictal and medications Vitals:   08/18/19 2120 08/18/19 2140 08/18/19 2200 08/18/19 2341  BP: (!) 141/87 (!) 163/97 (!) 144/94 123/72  Pulse: 96 (!) 125 94 (!) 111  Resp: (!) 21 14 19    Temp:    99 F (37.2 C)  TempSrc:    Oral  SpO2: 99% 100% 99% 93%  Weight:      Height:       Eyes: PERRL, lids and conjunctivae normal ENMT: Mucous membranes are moist. Posterior pharynx clear of any exudate or lesions.Normal dentition.  Neck: normal, supple, no masses, no thyromegaly Respiratory: clear to auscultation bilaterally, no wheezing, no crackles. Normal respiratory effort. No accessory muscle use.  Cardiovascular: Sinus tachycardia, no murmurs / rubs / gallops. No extremity edema. 2+ pedal pulses. No carotid bruits.  Abdomen: no tenderness, no masses palpated. No hepatosplenomegaly. Bowel sounds positive.  Musculoskeletal: no clubbing / cyanosis. No joint deformity upper and lower extremities. Good ROM, no contractures. Normal muscle tone.  Skin: no rashes, lesions, ulcers. No induration Neurologic: CN 2-12 grossly intact. Sensation intact, DTR normal. Strength 5/5 in all 4.  Psychiatric: Obtunded, not communicating    Labs on Admission: I have personally reviewed following labs and imaging studies  CBC: Recent Labs  Lab 08/18/19 1534  WBC 5.9  NEUTROABS 3.6  HGB 14.1  HCT 42.5  MCV 79.7*  PLT 263   Basic Metabolic Panel: Recent Labs  Lab  08/18/19 1534  NA 139  K 3.7  CL 104  CO2 18*  GLUCOSE 191*  BUN 16  CREATININE 1.28*  CALCIUM 9.0   GFR: Estimated Creatinine Clearance: 78.6 mL/min (A) (by C-G formula based on SCr of 1.28 mg/dL (H)). Liver Function Tests: Recent Labs  Lab 08/18/19 1534  AST 25  ALT 11  ALKPHOS 50  BILITOT 1.0  PROT 7.3  ALBUMIN 4.1   No results for input(s): LIPASE, AMYLASE in the last 168 hours. No results for input(s): AMMONIA in the last 168 hours. Coagulation Profile: No results for input(s): INR, PROTIME in the last 168 hours. Cardiac Enzymes: No results for input(s): CKTOTAL, CKMB, CKMBINDEX, TROPONINI in the last 168 hours. BNP (last 3 results) No results for input(s): PROBNP in the last  8760 hours. HbA1C: No results for input(s): HGBA1C in the last 72 hours. CBG: Recent Labs  Lab 08/18/19 1833  GLUCAP 132*   Lipid Profile: No results for input(s): CHOL, HDL, LDLCALC, TRIG, CHOLHDL, LDLDIRECT in the last 72 hours. Thyroid Function Tests: No results for input(s): TSH, T4TOTAL, FREET4, T3FREE, THYROIDAB in the last 72 hours. Anemia Panel: No results for input(s): VITAMINB12, FOLATE, FERRITIN, TIBC, IRON, RETICCTPCT in the last 72 hours. Urine analysis:    Component Value Date/Time   COLORURINE YELLOW (A) 09/17/2015 1655   APPEARANCEUR CLEAR (A) 09/17/2015 1655   APPEARANCEUR Clear 09/11/2012 0444   LABSPEC 1.027 09/17/2015 1655   LABSPEC 1.002 09/11/2012 0444   PHURINE 5.0 09/17/2015 1655   GLUCOSEU NEGATIVE 09/17/2015 1655   GLUCOSEU Negative 09/11/2012 0444   HGBUR NEGATIVE 09/17/2015 1655   BILIRUBINUR NEGATIVE 09/17/2015 1655   BILIRUBINUR Negative 09/11/2012 0444   KETONESUR NEGATIVE 09/17/2015 1655   PROTEINUR 30 (A) 09/17/2015 1655   NITRITE NEGATIVE 09/17/2015 1655   LEUKOCYTESUR NEGATIVE 09/17/2015 1655   LEUKOCYTESUR Negative 09/11/2012 0444   Sepsis Labs: @LABRCNTIP (procalcitonin:4,lacticidven:4) )No results found for this or any previous visit  (from the past 240 hour(s)).   Radiological Exams on Admission: CT Head Wo Contrast  Result Date: 08/18/2019 CLINICAL DATA:  Witnessed seizure. EXAM: CT HEAD WITHOUT CONTRAST TECHNIQUE: Contiguous axial images were obtained from the base of the skull through the vertex without intravenous contrast. COMPARISON:  11/29/2018 FINDINGS: Brain: The brain shows a normal appearance without evidence of malformation, atrophy, old or acute small or large vessel infarction, mass lesion, hemorrhage, hydrocephalus or extra-axial collection. Vascular: No hyperdense vessel. No evidence of atherosclerotic calcification. Skull: Normal.  No traumatic finding.  No focal bone lesion. Sinuses/Orbits: Sinuses are clear. Orbits appear normal. Mastoids are clear. Other: None significant IMPRESSION: Normal head CT Electronically Signed   By: 12/01/2018 M.D.   On: 08/18/2019 16:34    EKG: Independently reviewed.  Sinus tachycardia with no significant ST change  Assessment/Plan Principal Problem:   Seizure (HCC) Active Problems:   Diabetes (HCC)   Hypothyroid   Undifferentiated schizophrenia (HCC)   Dyslipidemia   HTN (hypertension)   Noncompliance     #1 multiple seizures: Most likely due to noncompliance.  Patient's Depakote level is less than 10.  He was supposed to be on both Depakote and clonazepam.  He has received initial IV dose in the ER.  I will continue with Depakote in the inpatient setting.  Currently ordered as p.o. but if patient unable to take it by mouth I will switch to IV.  Follow Depakote level in the a.m.  #2 diabetes: Sliding scale insulin and monitor  #3 schizophrenia: Patient was agitated requiring a sitter.  I will continue with sitter for now  #4 hypothyroidism: Continue with levothyroxine.  #5 hypertension: Continue [name] pressure control.  #6 medication noncompliance: Social work involvement prior to discharge to ensure patient is getting his medicine in the group home   DVT  prophylaxis: Lovenox Code Status: Full code Family Communication: No family at bedside Disposition Plan: Probably back to group home Consults called: None but may require neurology input Admission status: Inpatient  Severity of Illness: The appropriate patient status for this patient is INPATIENT. Inpatient status is judged to be reasonable and necessary in order to provide the required intensity of service to ensure the patient's safety. The patient's presenting symptoms, physical exam findings, and initial radiographic and laboratory data in the context of their chronic comorbidities is  felt to place them at high risk for further clinical deterioration. Furthermore, it is not anticipated that the patient will be medically stable for discharge from the hospital within 2 midnights of admission. The following factors support the patient status of inpatient.   " The patient's presenting symptoms include multiple seizures. " The worrisome physical exam findings include complete obtundation. " The initial radiographic and laboratory data are worrisome because of low valproic acid level. " The chronic co-morbidities include seizure disorder.   * I certify that at the point of admission it is my clinical judgment that the patient will require inpatient hospital care spanning beyond 2 midnights from the point of admission due to high intensity of service, high risk for further deterioration and high frequency of surveillance required.Gregory Blood MD [facility] Pager [phone]  If 7PM-7AM, please contact night-coverage [email or web address] Password [email or web address]  08/19/2019, 12:30 AM

## 2019-08-18 NOTE — ED Notes (Signed)
Patient's legal guardian Porshe informed of patient's seizure and associated fall. Informed of patient's admission. Porshe expresses understanding, this RN assured her that another RN would call her if anything changes

## 2019-08-18 NOTE — ED Triage Notes (Signed)
Pt to ED via [facility] from group home. Per EMS pt had a witness seziure by roommate. Pt has hx of seizures last one 42months ago. Per EMS they were unable to get VS due to pt being combative. EMS able to get HR 140 and O2 100% on RA. Per EMS facility states this is pt's normal with little verbal communication. PT also hx of schizophrenia.

## 2019-08-18 NOTE — ED Notes (Addendum)
Patient awake, refusing blood sugar check. Becomes combative when this RN and tech attempt to perform patient care. Will notify NP

## 2019-08-18 NOTE — ED Notes (Signed)
Pt waiting on admission bed.  

## 2019-08-18 NOTE — ED Notes (Signed)
Patient responds to voice but does not open eyes

## 2019-08-18 NOTE — ED Notes (Signed)
fsbs after seizure 132

## 2019-08-18 NOTE — ED Notes (Signed)
Patient pulled condom catheter off, unable to get a urine sample at this time. Will attempt later

## 2019-08-18 NOTE — ED Notes (Addendum)
Pt to ct scan.  Pt more alert now.  Seizure pads now in place.

## 2019-08-18 NOTE — ED Notes (Signed)
This rn spoke with porshe [phone] legal gaurdian for pt.  Will call gaurdian again at time of discharge.

## 2019-08-18 NOTE — ED Notes (Signed)
Pt sitting on bed and looked backwards and slid off end of bed onto floor and began having a seizure.sitter eased pt to the floor.  Pt did not hit his head.  Iv restarted .  md with pt.  Airway maintained with pt on his side.  No n/v.  No meds given at this time.

## 2019-08-18 NOTE — ED Notes (Signed)
Pt still sleeping at this time. Will remain at bedside as 1:1 Recruitment consultant.

## 2019-08-18 NOTE — ED Notes (Signed)
Pt given warm blanket and returned to sleep; this tech remains at bedside as 1:1 safety sitter at this time.

## 2019-08-18 NOTE — ED Notes (Signed)
Patient sleeping at present.  

## 2019-08-18 NOTE — ED Notes (Signed)
This tech at bedside as 1:1 Recruitment consultant; received report from [place], [place]. Pt sleeping at this time.

## 2019-08-18 NOTE — ED Notes (Addendum)
Pt brought in via ems from a group home with seizure activity today.  On arrival to er, pt unable to follow verbal commands.  Hx schizophrenia.  Iv started and labs sent  Sinus tach on monitor.  md at bedside.  siderails up x 2  No seizure pads available at this time

## 2019-08-18 NOTE — ED Notes (Signed)
Sinus tach on monitor at 155.  Pt trying to get out of bed.  meds given .  Sitter with pt.  2 liters oxygen [place] in place.

## 2019-08-18 NOTE — ED Notes (Signed)
Floor is trying to find sitter for patient

## 2019-08-18 NOTE — ED Notes (Signed)
Message left with [place] sheriff office to page on call dss person for legal gaurdianship.

## 2019-08-18 NOTE — ED Notes (Signed)
Pt sleeping comfortably at this time. Will remain as 1:1 Recruitment consultant.

## 2019-08-18 NOTE — ED Notes (Signed)
Attempted to get patient to drink some water, to assess if patient will be able to tolerate PO meds. Patient does not follow directions and will not sip from straw or cup. Patient remains confused, will attempt again later, as patient may still be postictal

## 2019-08-18 NOTE — ED Notes (Signed)
ED TO INPATIENT HANDOFF REPORT  ED Nurse Name and Phone #: Betti Cruz Name/Age/Gender [name] 64 y.o. male Room/Bed: ED19A/ED19A  Code Status   Code Status: Prior  Home/SNF/Other Boarding Home Patient oriented to: self Is this baseline? No   Triage Complete: Triage complete  Chief Complaint Seizure [facility]) [R56.9]  Triage Note Pt to ED via [facility] from group home. Per EMS pt had a witness seziure by roommate. Pt has hx of seizures last one 60months ago. Per EMS they were unable to get VS due to pt being combative. EMS able to get HR 140 and O2 100% on RA. Per EMS facility states this is pt's normal with little verbal communication. PT also hx of schizophrenia.     Allergies Allergies  Allergen Reactions  . Depakote [Valproic Acid] Other (See Comments)    Reaction:  Hyperammonemia   . Lithium Other (See Comments)    Pt has a prior history of Lithium toxicity.       Level of Care/Admitting Diagnosis ED Disposition    ED Disposition Condition Comment   Admit  [facility]ospital Area[facility] [[id number]]  Level of Care: Telemetry [5]  Covid Evaluation: Asymptomatic Screening Protocol (No Symptoms)  Diagnosis: Seizure (HCC) [205090]  Admitting Physician: Rometta Emery [[id number]]  Attending Physician: Rometta Emery [[id number]]       B Medical/Surgery History Past Medical History:  Diagnosis Date  . Diabetes mellitus without complication (HCC)   . HTN (hypertension)   . Hyperammonemia (HCC) While on Depakote  . Hyperlipemia   . Lithium toxicity   . Neutropenia (HCC)   . Schizophrenia (HCC)   . Seizure (HCC) While toxic on Depakote  . Sickle cell trait (HCC)   . Thyroid disease   . TIA (transient ischemic attack) in 2009   History reviewed. No pertinent surgical history.   A IV Location/Drains/Wounds Patient Lines/Drains/Airways Status   Active Line/Drains/Airways    None          Intake/Output Last 24 hours No intake or output data in the  24 hours ending 08/18/19 1928  Labs/Imaging Results for orders placed or performed during the hospital encounter of 08/18/19 (from the past 48 hour(s))  Comprehensive metabolic panel     Status: Abnormal   Collection Time: 08/18/19  3:34 PM  Result Value Ref Range   Sodium 139 135 - 145 mmol/L   Potassium 3.7 3.5 - 5.1 mmol/L   Chloride 104 98 - 111 mmol/L   CO2 18 (L) 22 - 32 mmol/L   Glucose, Bld 191 (H) 70 - 99 mg/dL   BUN 16 8 - 23 mg/dL   Creatinine, Ser 4.09 (H) 0.61 - 1.24 mg/dL   Calcium 9.0 8.9 - 81.1 mg/dL   Total Protein 7.3 6.5 - 8.1 g/dL   Albumin 4.1 3.5 - 5.0 g/dL   AST 25 15 - 41 U/L   ALT 11 0 - 44 U/L   Alkaline Phosphatase 50 38 - 126 U/L   Total Bilirubin 1.0 0.3 - 1.2 mg/dL   GFR calc non Af Amer 59 (L) >60 mL/min   GFR calc Af Amer >60 >60 mL/min   Anion gap 17 (H) 5 - 15    Comment: Performed at [facility], [address] [place], [place] [address]  CBC with Differential     Status: Abnormal   Collection Time: 08/18/19  3:34 PM  Result Value Ref Range   WBC 5.9 4.0 - 10.5 K/uL  RBC 5.33 4.22 - 5.81 MIL/uL   Hemoglobin 14.1 13.0 - 17.0 g/dL   HCT 06.3 01.6 - 01.0 %   MCV 79.7 (L) 80.0 - 100.0 fL   MCH 26.5 26.0 - 34.0 pg   MCHC 33.2 30.0 - 36.0 g/dL   RDW 93.2 35.5 - 73.2 %   Platelets 263 150 - 400 K/uL   nRBC 0.0 0.0 - 0.2 %   Neutrophils Relative % 60 %   Neutro Abs 3.6 1.7 - 7.7 K/uL   Lymphocytes Relative 32 %   Lymphs Abs 1.9 0.7 - 4.0 K/uL   Monocytes Relative 6 %   Monocytes Absolute 0.4 0.1 - 1.0 K/uL   Eosinophils Relative 1 %   Eosinophils Absolute 0.1 0.0 - 0.5 K/uL   Basophils Relative 0 %   Basophils Absolute 0.0 0.0 - 0.1 K/uL   Immature Granulocytes 1 %   Abs Immature Granulocytes 0.03 0.00 - 0.07 K/uL    Comment: Performed at [facility], [address] [place], [place] [address]  Valproic acid level     Status: Abnormal   Collection Time: 08/18/19  3:34 PM  Result Value Ref Range   Valproic Acid  Lvl <10 (L) 50.0 - 100.0 ug/mL    Comment: RESULT CONFIRMED BY MANUAL DILUTION KLW Performed at [facility], [address] [place], [place] [address]   Glucose, capillary     Status: Abnormal   Collection Time: 08/18/19  6:33 PM  Result Value Ref Range   Glucose-Capillary 132 (H) 70 - 99 mg/dL   Comment 1 Notify RN    Comment 2 Document in Chart    CT Head Wo Contrast  Result Date: 08/18/2019 CLINICAL DATA:  Witnessed seizure. EXAM: CT HEAD WITHOUT CONTRAST TECHNIQUE: Contiguous axial images were obtained from the base of the skull through the vertex without intravenous contrast. COMPARISON:  11/29/2018 FINDINGS: Brain: The brain shows a normal appearance without evidence of malformation, atrophy, old or acute small or large vessel infarction, mass lesion, hemorrhage, hydrocephalus or extra-axial collection. Vascular: No hyperdense vessel. No evidence of atherosclerotic calcification. Skull: Normal.  No traumatic finding.  No focal bone lesion. Sinuses/Orbits: Sinuses are clear. Orbits appear normal. Mastoids are clear. Other: None significant IMPRESSION: Normal head CT Electronically Signed   By: Paulina Fusi M.D.   On: 08/18/2019 16:34    Pending Labs Unresulted Labs (From admission, onward)    Start     Ordered   08/18/19 1845  SARS CORONAVIRUS 2 (TAT 6-24 HRS) Nasopharyngeal Nasopharyngeal Swab  (Tier 3 (TAT 6-24 hrs))  ONCE - STAT,   STAT    Question Answer Comment  Is this test for diagnosis or screening Screening   Symptomatic for COVID-19 as defined by CDC No   Hospitalized for COVID-19 No   Admitted to ICU for COVID-19 No   Previously tested for COVID-19 Yes   Resident in a congregate (group) care setting No   Employed in healthcare setting No      08/18/19 1844   08/18/19 1534  Urinalysis, Complete w Microscopic  ONCE - STAT,   STAT     08/18/19 1534   Signed and Held  Hemoglobin A1c  Once,   R    Comments: To assess prior glycemic control    Signed and Held    Signed and Held  HIV Antibody (routine testing w rflx)  (HIV Antibody (Routine testing w reflex) panel)  Once,   R     Signed  and Held   Signed and Held  CBC  (enoxaparin (LOVENOX)    CrCl >/= 30 ml/min)  Once,   R    Comments: Baseline for enoxaparin therapy IF NOT ALREADY DRAWN.  Notify MD if PLT < 100 K.    Signed and Held   Signed and Held  Creatinine, serum  (enoxaparin (LOVENOX)    CrCl >/= 30 ml/min)  Once,   R    Comments: Baseline for enoxaparin therapy IF NOT ALREADY DRAWN.    Signed and Held   Signed and Held  Creatinine, serum  (enoxaparin (LOVENOX)    CrCl >/= 30 ml/min)  Weekly,   R    Comments: while on enoxaparin therapy    Signed and Held   Signed and Held  Comprehensive metabolic panel  Tomorrow morning,   R     Signed and Held   Signed and Held  CBC  Tomorrow morning,   R     Signed and Held          Vitals/Pain Today's Vitals   08/18/19 1850 08/18/19 1854 08/18/19 1855 08/18/19 1900  BP:    113/61  Pulse: (!) 119 (!) 118 (!) 118 (!) 116  Resp: (!) 23 (!) 22 (!) 22 20  Temp:      TempSrc:      SpO2: 99% 100% 100% 100%  Weight:      Height:      PainSc:        Isolation Precautions No active isolations  Medications Medications  sodium chloride 0.9 % bolus 1,000 mL (0 mLs Intravenous Stopped 08/18/19 1721)  divalproex (DEPAKOTE SPRINKLE) capsule 1,000 mg (1,000 mg Oral Given 08/18/19 1817)  LORazepam (ATIVAN) injection 2 mg (2 mg Intravenous Given 08/18/19 1836)    Mobility walks with person assist High fall risk   Focused Assessments Neuro Assessment Handoff:  Swallow screen pass? Yes          Neuro Assessment:   Neuro Checks:      Last Documented NIHSS Modified Score:   Has TPA been given? No If patient is a Neuro Trauma and patient is going to OR before floor call report to [place] nurse: [phone] or [phone]     R Recommendations: See Admitting Provider Note  Report given to:   Additional Notes:

## 2019-08-18 NOTE — ED Notes (Signed)
Pt urinated on self and on bed; this tech changed pt with [place], RN assistance. Linen, brief, chucks, and gown changed. Pt now resting comfortably with eyes closed. This tech remains at bedside as 1:1 Recruitment consultant.

## 2019-08-18 NOTE — ED Notes (Signed)
Patient tolerated PO meds well, answers questions and is more alert. Patient does appear to be experiencing some delusions

## 2019-08-18 NOTE — ED Provider Notes (Signed)
[facility] Emergency Department Provider Note  ____________________________________________   First MD Initiated Contact with Patient 08/18/19 1519     (approximate)  I have reviewed the triage vital signs and the nursing notes.  History  Chief Complaint No chief complaint on file.    HPI [name] is a 64 y.o. male with hx of schizophrenia, seizures who presents to the emergency department for reported seizure-like activity.  Patient was apparently standing in a closet (which is not abnormal behavior for him due to hx of schizophrenia) when staff facility heard a loud noise, found him on the ground and noted whole-body seizure-like activity.  This lasted approximately 5 minutes before resolving.  Reportedly combative and postictal with EMS.  On arrival he is mildly confused.  Per facility staff, last seizure at least 6 months ago.  No missed medications (including clonazepam and depakote).  No vomiting that facility is aware of, however per representative, patient is allowed to walk around the neighborhood during the day, so he is not continuously monitored, and therefore possible he has vomited or not been taking his medications after administration.  Per facility, he is not very interactive at baseline. He will occasionally talk about sports but otherwise does not really engage in conversation. Ambulatory independently.  Caveat: hx limited due to post ictal state, severe schizophrenia.  Past Medical Hx Past Medical History:  Diagnosis Date  . Diabetes mellitus without complication (HCC)   . HTN (hypertension)   . Hyperammonemia (HCC) While on Depakote  . Hyperlipemia   . Lithium toxicity   . Neutropenia (HCC)   . Schizophrenia (HCC)   . Seizure (HCC) While toxic on Depakote  . Sickle cell trait (HCC)   . Thyroid disease   . TIA (transient ischemic attack) in 2009    Problem List Patient Active Problem List   Diagnosis Date Noted  . Tardive  dyskinesia 11/21/2015  . Noncompliance 11/21/2015  . HTN (hypertension) 10/10/2015  . Dyslipidemia 09/25/2015  . Undifferentiated schizophrenia (HCC)   . Diabetes (HCC) 09/16/2015  . Hypothyroid 09/16/2015    Past Surgical Hx No past surgical history on file.  Medications Prior to Admission medications   Medication Sig Start Date End Date Taking? Authorizing Provider  aspirin EC 81 MG tablet Take 81 mg by mouth daily.    [provider]  benztropine (COGENTIN) 1 MG tablet Take 1 tablet (1 mg total) by mouth 2 (two) times daily with a meal. 12/10/18   Mariel Craft, MD  clonazePAM (KLONOPIN) 0.5 MG tablet Take 1 tablet (0.5 mg total) by mouth 2 (two) times daily with a meal. 10/04/15   Jimmy Footman, MD  divalproex (DEPAKOTE SPRINKLE) 125 MG capsule Take 8 capsules (1,000 mg total) by mouth 2 (two) times daily with a meal. 12/10/18   Mariel Craft, MD  divalproex (DEPAKOTE SPRINKLES) 125 MG capsule Take 8 capsules (1,000 mg total) by mouth 2 (two) times daily with a meal for 30 days. 12/10/18 01/09/19  Sharman Cheek, MD  folic acid (FOLVITE) 1 MG tablet Take 1 mg by mouth daily.    [provider]  haloperidol (HALDOL) 10 MG tablet Take 1 tablet (10 mg total) by mouth 2 (two) times daily. Patient taking differently: Take 12.5 mg by mouth 2 (two) times a day.  10/04/15   Jimmy Footman, MD  haloperidol (HALDOL) 10 MG tablet Take 1.5 tablets (15 mg total) by mouth 2 (two) times daily with a meal. 12/10/18   Viviano Simas,  Orlean Bradford, MD  hydrOXYzine (ATARAX/VISTARIL) 50 MG tablet Take 50 mg by mouth 2 (two) times daily as needed for anxiety.    [provider]  levothyroxine (SYNTHROID, LEVOTHROID) 125 MCG tablet Take 1 tablet (125 mcg total) by mouth daily before breakfast. 10/04/15   Jimmy Footman, MD  lisinopril (PRINIVIL,ZESTRIL) 2.5 MG tablet Take 1 tablet (2.5 mg total) by mouth daily. 10/04/15   Jimmy Footman, MD    metFORMIN (GLUCOPHAGE) 1000 MG tablet Take 1 tablet (1,000 mg total) by mouth 2 (two) times daily with a meal. 10/04/15   Jimmy Footman, MD  OLANZapine zydis (ZYPREXA) 20 MG disintegrating tablet Take 20 mg by mouth 2 (two) times daily.    [provider]  OLANZapine zydis (ZYPREXA) 20 MG disintegrating tablet Take 1 tablet (20 mg total) by mouth 2 (two) times daily with a meal. 12/10/18   Mariel Craft, MD  simvastatin (ZOCOR) 20 MG tablet Take 20 mg by mouth at bedtime.    [provider]  vitamin B-12 (CYANOCOBALAMIN) 1000 MCG tablet Take 1,000 mcg by mouth daily.    [provider]    Allergies Depakote [valproic acid] and Lithium  Family Hx No family history on file.  Social Hx Social History   Tobacco Use  . Smoking status: Unknown If Ever Smoked  Substance Use Topics  . Alcohol use: No  . Drug use: Not on file     Review of Systems Unable to obtain due to postictal status, history of severe schizophrenia   Physical Exam  Vital Signs: ED Triage Vitals [08/18/19 1530]  Enc Vitals Group     BP (!) 147/84     Pulse Rate (!) 130     Resp (!) 32     Temp 98.2 F (36.8 C)     Temp Source Oral     SpO2 95 %     Weight 210 lb (95.3 kg)     Height 6' (1.829 m)     Head Circumference      Peak Flow      Pain Score 0     Pain Loc      Pain Edu?      Excl. in GC?      Constitutional: Awake.  Moves all extremities, does not participate in conversation. Head: Normocephalic. Atraumatic. Eyes: Conjunctivae clear. Sclera anicteric. Nose: No congestion. No rhinorrhea. Mouth/Throat: Wearing mask.  Neck: No stridor.   Cardiovascular: Tachycardic. Extremities well perfused. Respiratory: Normal respiratory effort.  Lungs CTAB. Gastrointestinal: Soft. Non-tender. Non-distended.  Musculoskeletal: No lower extremity edema. No deformities. Neurologic: Seemingly postictal.  Moves all extremities. Skin: Skin is warm, dry and intact. No  rash noted. Psychiatric: Mood and affect are appropriate for situation.  EKG  Personally reviewed.   Rate: 132 Rhythm: sinus Axis: LAD Intervals: WNL, no prolonged QTc T wave inversion I, aVL    Radiology  CT head: IMPRESSION:  Normal head CT    Procedures  Procedure(s) performed (including critical care):  Procedures   Initial Impression / Assessment and Plan / ED Course  64 y.o. male hx of schizophrenia, seizure who presents to the ED for seizure like activity  Ddx: known seizure disorder, intracranial injury, electrolyte abnormality, infection, sub therapeutic AED  Will evaluate with labs, CT head  Imaging negative.  Valproic acid level undetectable - likely etiology of his seizures. Will give his home dose and reassess.   6:33 PM Patient with seizure event lasting about 1 minute. Was sitting at  the edge of his bed when he started seizing, lowered to the ground by ED tech, no head injury or trauma. Rolled to side, airway protected. No vomiting. Seizure stopped spontaneously, but then became agitated/post ictal, so 2 mg Ativan administered for patient safety. Given he has had 2 seizures w/in 24 hours and has previously been seizure free x 6 months will plan for admission. Discussed w/ hospitalist.    Final Clinical Impression(s) / ED Diagnosis  Final diagnoses:  Seizure-like activity [facility])       Note:  This document was prepared using Dragon voice recognition software and may include unintentional dictation errors.   Lilia Pro., MD 08/18/19 [phone]

## 2019-08-18 NOTE — ED Notes (Addendum)
Pt pulled iv out and was found walking in the hall.  Pt voided on the floor.  Charge nurse aware .  Sitter with pt.

## 2019-08-18 NOTE — ED Notes (Signed)
Pt walked out of room with a gown wrapped around his waist, noticed blood to gown. IV was pulled out. This RN and Customer service manager redirected pt back to room x 2 in 5 minutes. Security at bedside to make sure pt ambulated back to room. Therapist, music requested. Pt easily directed though with steady gait.

## 2019-08-19 DIAGNOSIS — E785 Hyperlipidemia, unspecified: Secondary | ICD-10-CM | POA: Diagnosis present

## 2019-08-19 DIAGNOSIS — Z8673 Personal history of transient ischemic attack (TIA), and cerebral infarction without residual deficits: Secondary | ICD-10-CM | POA: Diagnosis not present

## 2019-08-19 DIAGNOSIS — Z79890 Hormone replacement therapy (postmenopausal): Secondary | ICD-10-CM | POA: Diagnosis not present

## 2019-08-19 DIAGNOSIS — R569 Unspecified convulsions: Secondary | ICD-10-CM | POA: Diagnosis present

## 2019-08-19 DIAGNOSIS — R451 Restlessness and agitation: Secondary | ICD-10-CM | POA: Diagnosis not present

## 2019-08-19 DIAGNOSIS — Z5320 Procedure and treatment not carried out because of patient's decision for unspecified reasons: Secondary | ICD-10-CM | POA: Diagnosis present

## 2019-08-19 DIAGNOSIS — E119 Type 2 diabetes mellitus without complications: Secondary | ICD-10-CM | POA: Diagnosis present

## 2019-08-19 DIAGNOSIS — G40909 Epilepsy, unspecified, not intractable, without status epilepticus: Secondary | ICD-10-CM | POA: Diagnosis present

## 2019-08-19 DIAGNOSIS — E039 Hypothyroidism, unspecified: Secondary | ICD-10-CM | POA: Diagnosis present

## 2019-08-19 DIAGNOSIS — I10 Essential (primary) hypertension: Secondary | ICD-10-CM | POA: Diagnosis present

## 2019-08-19 DIAGNOSIS — Z7984 Long term (current) use of oral hypoglycemic drugs: Secondary | ICD-10-CM | POA: Diagnosis not present

## 2019-08-19 DIAGNOSIS — N179 Acute kidney failure, unspecified: Secondary | ICD-10-CM | POA: Diagnosis not present

## 2019-08-19 DIAGNOSIS — Z9114 Patient's other noncompliance with medication regimen: Secondary | ICD-10-CM | POA: Diagnosis not present

## 2019-08-19 DIAGNOSIS — D573 Sickle-cell trait: Secondary | ICD-10-CM | POA: Diagnosis present

## 2019-08-19 DIAGNOSIS — F209 Schizophrenia, unspecified: Secondary | ICD-10-CM | POA: Diagnosis not present

## 2019-08-19 DIAGNOSIS — Z7982 Long term (current) use of aspirin: Secondary | ICD-10-CM | POA: Diagnosis not present

## 2019-08-19 DIAGNOSIS — F203 Undifferentiated schizophrenia: Secondary | ICD-10-CM | POA: Diagnosis present

## 2019-08-19 DIAGNOSIS — Z20822 Contact with and (suspected) exposure to covid-19: Secondary | ICD-10-CM | POA: Diagnosis present

## 2019-08-19 DIAGNOSIS — Z79899 Other long term (current) drug therapy: Secondary | ICD-10-CM | POA: Diagnosis not present

## 2019-08-19 DIAGNOSIS — R000 Tachycardia, unspecified: Secondary | ICD-10-CM | POA: Diagnosis not present

## 2019-08-19 DIAGNOSIS — R21 Rash and other nonspecific skin eruption: Secondary | ICD-10-CM | POA: Diagnosis present

## 2019-08-19 LAB — COMPREHENSIVE METABOLIC PANEL
ALT: 10 U/L (ref 0–44)
AST: 20 U/L (ref 15–41)
Albumin: 3.3 g/dL — ABNORMAL LOW (ref 3.5–5.0)
Alkaline Phosphatase: 45 U/L (ref 38–126)
Anion gap: 8 (ref 5–15)
BUN: 12 mg/dL (ref 8–23)
CO2: 25 mmol/L (ref 22–32)
Calcium: 8.2 mg/dL — ABNORMAL LOW (ref 8.9–10.3)
Chloride: 108 mmol/L (ref 98–111)
Creatinine, Ser: 0.96 mg/dL (ref 0.61–1.24)
GFR calc Af Amer: >60 mL/min (ref >60)
GFR calc non Af Amer: >60 mL/min (ref >60)
Glucose, Bld: 111 mg/dL — ABNORMAL HIGH (ref 70–99)
Potassium: 3.5 mmol/L (ref 3.5–5.1)
Sodium: 141 mmol/L (ref 135–145)
Total Bilirubin: 1.1 mg/dL (ref 0.3–1.2)
Total Protein: 6.2 g/dL — ABNORMAL LOW (ref 6.5–8.1)

## 2019-08-19 LAB — CBC
HCT: 35.9 % — ABNORMAL LOW (ref 39.0–52.0)
Hemoglobin: 12 g/dL — ABNORMAL LOW (ref 13.0–17.0)
MCH: 26.4 pg (ref 26.0–34.0)
MCHC: 33.4 g/dL (ref 30.0–36.0)
MCV: 78.9 fL — ABNORMAL LOW (ref 80.0–100.0)
Platelets: 200 10*3/uL (ref 150–400)
RBC: 4.55 MIL/uL (ref 4.22–5.81)
RDW: 13.8 % (ref 11.5–15.5)
WBC: 5.6 10*3/uL (ref 4.0–10.5)
nRBC: 0 % (ref 0.0–0.2)

## 2019-08-19 LAB — SARS CORONAVIRUS 2 (TAT 6-24 HRS): SARS Coronavirus 2: NEGATIVE

## 2019-08-19 LAB — GLUCOSE, CAPILLARY
Glucose-Capillary: 112 mg/dL — ABNORMAL HIGH (ref 70–99)
Glucose-Capillary: 111 mg/dL — ABNORMAL HIGH (ref 70–99)
Glucose-Capillary: 118 mg/dL — ABNORMAL HIGH (ref 70–99)

## 2019-08-19 LAB — HIV ANTIBODY (ROUTINE TESTING W REFLEX): HIV Screen 4th Generation wRfx: NONREACTIVE

## 2019-08-19 LAB — HEMOGLOBIN A1C
Hgb A1c MFr Bld: 5.9 % — ABNORMAL HIGH (ref 4.8–5.6)
Mean Plasma Glucose: 122.63 mg/dL

## 2019-08-19 MED ORDER — PHENYTOIN SODIUM EXTENDED 100 MG PO CAPS
300.0000 mg | ORAL_CAPSULE | Freq: Every day | ORAL | Status: DC
  Filled 2019-08-19 (×4): qty 3

## 2019-08-19 MED ORDER — HALOPERIDOL LACTATE 5 MG/ML IJ SOLN: 2.0000 mg | Freq: Four times a day (QID) | INTRAMUSCULAR | Status: DC | PRN

## 2019-08-19 MED ORDER — SODIUM CHLORIDE 0.9 % IV SOLN
1500.0000 mg | Freq: Once | INTRAVENOUS | Status: AC
  Administered 2019-08-19: 1500 mg via INTRAVENOUS
  Filled 2019-08-19: qty 30

## 2019-08-19 NOTE — Plan of Care (Signed)
Pt will remain free injury during hospitalization, with decreased seizure activity. Pt will comply with treatment procedures.

## 2019-08-19 NOTE — Progress Notes (Signed)
Physical Therapy Evaluation Patient Details Name: [name] MRN: [id number] DOB: 25-Jul-1955 Today's Date: 08/19/2019   History of Present Illness  Per MD note:[name] is a 64 y.o. male with medical history significant of seizure disorder, schizophrenia, diabetes, hypertension, hyperammonemia, history of neutropenia, medication noncompliance who was brought in from a group home with 2 episodes of seizures today  Clinical Impression  Patient is not able to follow commands consistently. He was willing to sit up on edge of bed and performed this independently, and stand up from edge of bed without AD independently.  He was not willing to ambulate. His strength of BUE and BLE is WFL and sitting and standing balance appears to be normal. He is not able to verbalize answers to who he lives with or where he lives or if he has any equipment. He would benefit from skilled PT to assess his ambulation if he is willing.     Follow Up Recommendations Home health PT    Equipment Recommendations       Recommendations for Other Services       Precautions / Restrictions Restrictions Weight Bearing Restrictions: No      Mobility  Bed Mobility Overal bed mobility: Independent                Transfers Overall transfer level: Independent               General transfer comment: Patient is able to stand after encouragement  Ambulation/Gait Ambulation/Gait assistance: (Patient refuses)              Stairs            Wheelchair Mobility    Modified Rankin (Stroke Patients Only)       Balance                                             Pertinent Vitals/Pain Pain Assessment: 0-10    Home Living Family/patient expects to be discharged to:: (unknown)                      Prior Function Level of Independence: (unknown)               Hand Dominance   Dominant Hand: (unknown)    Extremity/Trunk Assessment   Upper  Extremity Assessment Upper Extremity Assessment: Overall WFL for tasks assessed    Lower Extremity Assessment Lower Extremity Assessment: Overall WFL for tasks assessed       Communication   Communication: Other (comment)(Patient is unable to have a conversation)  Cognition Arousal/Alertness: Awake/alert                                            General Comments      Exercises     Assessment/Plan    PT Assessment Patient needs continued PT services  PT Problem List Decreased safety awareness       PT Treatment Interventions Gait training    PT Goals (Current goals can be found in the Care Plan section)  Acute Rehab PT Goals Patient Stated Goal: no goals stated PT Goal Formulation: Patient unable to participate in goal setting Time For Goal Achievement: 09/02/19 Potential to Achieve Goals: Good    Frequency Min  2X/week   Barriers to discharge        Co-evaluation               AM-PAC PT "6 Clicks" Mobility  Outcome Measure Help needed turning from your back to your side while in a flat bed without using bedrails?: None Help needed moving from lying on your back to sitting on the side of a flat bed without using bedrails?: None Help needed moving to and from a bed to a chair (including a wheelchair)?: None Help needed standing up from a chair using your arms (e.g., wheelchair or bedside chair)?: None Help needed to walk in hospital room?: A Little Help needed climbing 3-5 steps with a railing? : A Little 6 Click Score: 22    End of Session Equipment Utilized During Treatment: Gait belt   Patient left: with nursing/sitter in room   PT Visit Diagnosis: Difficulty in walking, not elsewhere classified (R26.2)    Time: 5726-2035 PT Time Calculation (min) (ACUTE ONLY): 20 min   Charges:   PT Evaluation $PT Eval Low Complexity: 1 Low            Alanson Puls, PT DPT 08/19/2019, 9:29 AM

## 2019-08-19 NOTE — Progress Notes (Signed)
Patient refused 8:00 depakote as well as 10:00 meds. I went in twice to attempt to give them to him and he just kept his eyes closed but I could tell that he could hear me. MD informed.

## 2019-08-19 NOTE — Consult Note (Signed)
  Patient with extensive history of schizophrenia.  Presented from the group home due to history of seizures.  Normally takes Depakote however national shortage of Depakote may require changes to his seizure medication.  Psychiatry will continue to follow in order to adjust psychiatric medications as needed.  Was evaluated briefly tonight, however he requested wanting to sleep and was not showing any acute symptoms necessary of immediate med changes in his psychiatric regimen at this time.  Patient is large in size and is potentially intimidating.  This mixed with his chronic intermittent psychosis can easily lead to agitation.  Please utilize calming techniques and if necessary Haldol 5 mg and Ativan 2 mg IM  Current standing psychiatric meds include olanzapine 20 mg twice daily, haloperidol 15 mg twice daily, clonazepam 0.5 mg twice daily, benztropine 1 mg twice daily  Psychiatry will continue to follow as needed

## 2019-08-19 NOTE — Progress Notes (Signed)
Patient just refused all blood work and he also refused afternoon vitals and blood sugar check.

## 2019-08-19 NOTE — Progress Notes (Signed)
CBG 121, did not cross over from glucometer. Pt is calm, still disoretented,not coherent unable to answer admission questions. With 1:1 sitter at bedside

## 2019-08-19 NOTE — TOC Progression Note (Signed)
Transition of Care [facility]) - Progression Note    Patient Details  Name: Chaun Uemura MRN: [id number] Date of Birth: August 20, 1955  [facility]) CM/SW Contact  Barrie Dunker, RN Phone Number: 08/19/2019, 2:50 PM  Clinical Narrative:    The patient lives at a group home  He has a legal guardian with DSS Fredia Sorrow [phone] I have attempted to reach her X2 and left a VM requesting a return call each time, I have attempted to reach Lawanda at the group home at   [phone] and I get a messaging recording requesting the number who I am trying to reach, attempted to call the home number at [phone] and get a recording saying the person is not accepting calls at this time        Expected Discharge Plan and Services                                                 Social Determinants of Health (SDOH) Interventions    Readmission Risk Interventions No flowsheet data found.

## 2019-08-19 NOTE — Progress Notes (Signed)
[facility] Progress Note  Patient: [name]    PZW:[id number]  DOA: 08/18/2019     Date of Service: the patient was seen and examined on 08/19/2019  Chief Complaint  Patient presents with  . Seizures   Brief hospital course: Dolores Mcgovern is a 64 y.o. male with medical history significant of seizure disorder, schizophrenia, diabetes, hypertension, hyperammonemia, history of neutropenia, medication noncompliance who was brought in from a group home with 2 episodes of seizures today.  Patient has been on Depakote for his seizure.  He is currently postictal not able to give me any history.  He was agitated earlier on requiring a sitter.  Patient has been on clonazepam as well.  His Depakote level was notably low indicating poor compliance.  Due to recurrence of his seizures and multiple seizures today he is being admitted to the hospital for inpatient treatment.  ED Course: Temperature 99 blood pressure 1 67/97 pulse 156 respiratory rate of 36 and oxygen sat 93% room air.  CBC appears to be within normal.  Chemistry showed a creatinine 1.28 and CO2 of 18.  91 and calcium 9.0.  COVID-19 screen is negative.  Head CT without contrast is negative.  EKG shows sinus tachycardia with no significant findings.  Patient being admitted to the hospital for treatment of seizure with Depakote level of less than 10.  He has been loaded with Depakote in the ER  Currently further plan is continue treatment and monitor for neurological stabilization.  Assessment and Plan: 1.  Witnessed seizures Patient noncompliant with his medical regimen at home. Brought in as he had 2 witnessed seizures at the group home. CT head unremarkable. Patient is not cooperating with examination although does not appear to be having any focal deficit. Patient is on Depakote and clonazepam at home currently refusing both the medicines here. Due to national shortage of IV Depakote neurology was consulted as well as  psychiatry. Given patient's schizophrenia Keppra is not a good option. Patient will be treated with IV Dilantin and will continue with Dilantin after that.  If the patient continues to refuse oral Dilantin patient will require IV Dilantin. Pharmacy consulted for management. Appreciate neurology assistance no further work-up recommended by neurology at present.  2.  Schizophrenia. Medication noncompliance Psychiatry consulted for further assistance. Recommend to continue current management. Appreciate their help.  3.  Type 2 diabetes mellitus Continue to monitor for now.  4.  Hypothyroidism Continue Synthroid  5.  Essential hypertension Blood pressure control although refusing medication.  Monitor.  Diet: Regular diet DVT Prophylaxis: Subcutaneous Heparin    Advance goals of care discussion: Full code  Family Communication: no family was present at bedside, at the time of interview.  The pt provided permission to discuss medical plan with the family. Opportunity was given to ask question and all questions were answered satisfactorily.   Disposition:  Pt is from group home, admitted with recurrent seizures due to noncompliance, still has encephalopathy and possible acute schizophrenia, which precludes a safe discharge. Discharge to group home, when eating oral diet, taking oral medications.  Subjective: Flat affect.  Not cooperating with examination.  Physical Exam: General:  alert not oriented to time, place, and person.  Appear in mild distress, affect unresponsive Eyes: difficult to assess  ENT: Oral Mucosa Clear, moist  Neck: difficult to assess  JVD,  Cardiovascular: S1 and S2 Present, no Murmur,  Respiratory: good respiratory effort, Bilateral Air entry equal and Decreased, no Crackles, no wheezes Abdomen: Bowel Sound  present, Soft and difficult to assess  tenderness,  Skin: no rash Extremities: no Pedal edema, difficult to assess  calf tenderness Neurologic: without  any new focal findings Gait not checked due to patient safety concerns  Vitals:   08/18/19 2341 08/19/19 0720 08/19/19 1527 08/19/19 1733  BP: 123/72 118/71 120/81   Pulse: (!) 111 97 95   Resp:  16 16   Temp: 99 F (37.2 C) 99.1 F (37.3 C) 98.2 F (36.8 C)   TempSrc: Oral Oral Oral   SpO2: 93% 98% 100% (!) 2%  Weight:      Height:        Intake/Output Summary (Last 24 hours) at 08/19/2019 2005 Last data filed at 08/19/2019 1549 Gross per 24 hour  Intake 120 ml  Output --  Net 120 ml   Filed Weights   08/18/19 1530  Weight: 95.3 kg    Data Reviewed: I have personally reviewed and interpreted daily labs, tele strips, imagings as discussed above. I reviewed all nursing notes, pharmacy notes, vitals, pertinent old records I have discussed plan of care as described above with RN and patient/family.  CBC: Recent Labs  Lab 08/18/19 1534 08/19/19 0508  WBC 5.9 5.6  NEUTROABS 3.6  --   HGB 14.1 12.0*  HCT 42.5 35.9*  MCV 79.7* 78.9*  PLT 263 200   Basic Metabolic Panel: Recent Labs  Lab 08/18/19 1534 08/19/19 0508  NA 139 141  K 3.7 3.5  CL 104 108  CO2 18* 25  GLUCOSE 191* 111*  BUN 16 12  CREATININE 1.28* 0.96  CALCIUM 9.0 8.2*    Studies: No results found.  Scheduled Meds: . aspirin EC  81 mg Oral Daily  . benztropine  1 mg Oral BID WC  . clonazePAM  0.5 mg Oral BID WC  . enoxaparin (LOVENOX) injection  40 mg Subcutaneous Q24H  . folic acid  1 mg Oral Daily  . haloperidol  15 mg Oral BID WC  . insulin aspart  0-9 Units Subcutaneous TID WC  . levothyroxine  125 mcg Oral QAC breakfast  . OLANZapine zydis  20 mg Oral BID WC  . [START ON 08/20/2019] phenytoin  300 mg Oral QHS  . simvastatin  20 mg Oral QHS  . vitamin B-12  1,000 mcg Oral Daily   Continuous Infusions: PRN Meds: haloperidol lactate, hydrOXYzine, LORazepam, ondansetron **OR** ondansetron (ZOFRAN) IV  Time spent: 35 minutes  Author: Lynden Oxford, MD [facility] 08/19/2019  8:05 PM  To reach On-call, see care teams to locate the attending and reach out to them via [email or web address]. If 7PM-7AM, please contact night-coverage If you still have difficulty reaching the attending provider, please page the [facility] (Director on Call) for [facility] on amion for assistance.

## 2019-08-19 NOTE — Consult Note (Signed)
Reason for Consult: seizures Requesting Physician: Dr. Allena Katz  CC: seizures    HPI: [name] is an 64 y.o. male with medical history significant of seizure disorder, schizophrenia, diabetes, hypertension, hyperammonemia, history of neutropenia, medication noncompliance who was brought in from a group home with 2 episodes of seizures.  Patient has been on Depakote for his seizure but doesn't look like he is compliant. Switched from Keppra due to psychiatric history.     Past Medical History:  Diagnosis Date  . Diabetes mellitus without complication (HCC)   . HTN (hypertension)   . Hyperammonemia (HCC) While on Depakote  . Hyperlipemia   . Lithium toxicity   . Neutropenia (HCC)   . Schizophrenia (HCC)   . Seizure (HCC) While toxic on Depakote  . Sickle cell trait (HCC)   . Thyroid disease   . TIA (transient ischemic attack) in 2009    History reviewed. No pertinent surgical history.  History reviewed. No pertinent family history.  Social History:  reports that he does not drink alcohol. No history on file for tobacco and drug.  Allergies  Allergen Reactions  . Depakote [Valproic Acid] Other (See Comments)    Reaction:  Hyperammonemia   . Lithium Other (See Comments)    Pt has a prior history of Lithium toxicity.       Medications: I have reviewed the patient's current medications.  ROS: Unable to obtain as agitated and doesn't want to participate   Physical Examination: Blood pressure 118/71, pulse 97, temperature 99.1 F (37.3 C), temperature source Oral, resp. rate 16, height 6\' 8"  (2.032 m), weight 95.3 kg, SpO2 98 %.  Doesn't want to participate with examination  Follows commands and no clear focality on examination  Moves all extremities symmetrically.    Laboratory Studies:   Basic Metabolic Panel: Recent Labs  Lab 08/18/19 1534 08/19/19 0508  NA 139 141  K 3.7 3.5  CL 104 108  CO2 18* 25  GLUCOSE 191* 111*  BUN 16 12  CREATININE 1.28* 0.96   CALCIUM 9.0 8.2*    Liver Function Tests: Recent Labs  Lab 08/18/19 1534 08/19/19 0508  AST 25 20  ALT 11 10  ALKPHOS 50 45  BILITOT 1.0 1.1  PROT 7.3 6.2*  ALBUMIN 4.1 3.3*   No results for input(s): LIPASE, AMYLASE in the last 168 hours. No results for input(s): AMMONIA in the last 168 hours.  CBC: Recent Labs  Lab 08/18/19 1534 08/19/19 0508  WBC 5.9 5.6  NEUTROABS 3.6  --   HGB 14.1 12.0*  HCT 42.5 35.9*  MCV 79.7* 78.9*  PLT 263 200    Cardiac Enzymes: No results for input(s): CKTOTAL, CKMB, CKMBINDEX, TROPONINI in the last 168 hours.  BNP: Invalid input(s): POCBNP  CBG: Recent Labs  Lab 08/18/19 1833 08/19/19 0413 08/19/19 0720  GLUCAP 132* 112* 111*    Microbiology: Results for orders placed or performed during the hospital encounter of 08/18/19  SARS CORONAVIRUS 2 (TAT 6-24 HRS) Nasopharyngeal Nasopharyngeal Swab     Status: None   Collection Time: 08/18/19  7:26 PM   Specimen: Nasopharyngeal Swab  Result Value Ref Range Status   SARS Coronavirus 2 NEGATIVE NEGATIVE Final    Comment: (NOTE) SARS-CoV-2 target nucleic acids are NOT DETECTED. The SARS-CoV-2 RNA is generally detectable in upper and lower respiratory specimens during the acute phase of infection. Negative results do not preclude SARS-CoV-2 infection, do not rule out co-infections with other pathogens, and should not be used as the  sole basis for treatment or other patient management decisions. Negative results must be combined with clinical observations, patient history, and epidemiological information. The expected result is Negative. Fact Sheet for Patients: SugarRoll.be Fact Sheet for Healthcare Providers: https://www.woods-mathews.com/ This test is not yet approved or cleared by the [place] FDA and  has been authorized for detection and/or diagnosis of SARS-CoV-2 by FDA under an Emergency Use Authorization (EUA). This EUA will  remain  in effect (meaning this test can be used) for the duration of the COVID-19 declaration under Section 56 4(b)(1) of the Act, 21 U.S.C. section 360bbb-3(b)(1), unless the authorization is terminated or revoked sooner. Performed at [facility], [place] [address], [place], [place] [address]     Coagulation Studies: No results for input(s): LABPROT, INR in the last 72 hours.  Urinalysis: No results for input(s): COLORURINE, LABSPEC, PHURINE, GLUCOSEU, HGBUR, BILIRUBINUR, KETONESUR, PROTEINUR, UROBILINOGEN, NITRITE, LEUKOCYTESUR in the last 168 hours.  Invalid input(s): APPERANCEUR  Lipid Panel:  No results found for: CHOL, TRIG, HDL, CHOLHDL, VLDL, LDLCALC  HgbA1C:  Lab Results  Component Value Date   HGBA1C 5.9 (H) 08/19/2019    Urine Drug Screen:      Component Value Date/Time   LABOPIA NONE DETECTED 11/21/2015 2350   COCAINSCRNUR NONE DETECTED 11/21/2015 2350   LABBENZ NONE DETECTED 11/21/2015 2350   AMPHETMU NONE DETECTED 11/21/2015 2350   THCU NONE DETECTED 11/21/2015 2350   LABBARB NONE DETECTED 11/21/2015 2350    Alcohol Level: No results for input(s): ETH in the last 168 hours.  Other results: EKG: normal EKG, normal sinus rhythm, unchanged from previous tracings.  Imaging: CT Head Wo Contrast  Result Date: 08/18/2019 CLINICAL DATA:  Witnessed seizure. EXAM: CT HEAD WITHOUT CONTRAST TECHNIQUE: Contiguous axial images were obtained from the base of the skull through the vertex without intravenous contrast. COMPARISON:  11/29/2018 FINDINGS: Brain: The brain shows a normal appearance without evidence of malformation, atrophy, old or acute small or large vessel infarction, mass lesion, hemorrhage, hydrocephalus or extra-axial collection. Vascular: No hyperdense vessel. No evidence of atherosclerotic calcification. Skull: Normal.  No traumatic finding.  No focal bone lesion. Sinuses/Orbits: Sinuses are clear. Orbits appear normal. Mastoids are clear. Other: None  significant IMPRESSION: Normal head CT Electronically Signed   By: Nelson Chimes M.D.   On: 08/18/2019 16:34     Assessment/Plan:  64 y.o. male with medical history significant of seizure disorder, schizophrenia, diabetes, hypertension, hyperammonemia, history of neutropenia, medication noncompliance who was brought in from a group home with 2 episodes of seizures.  Patient has been on Depakote for his seizure but doesn't look like he is compliant. Switched from [place] due to psychiatric history.    - Loaded with dilantin and then start 300 qhs. Aware of Depakote shortage and he was no compliant with it - Agree Keppra might not be best medication for him.  - No need for MRI or EEG as known hx of seizure, non compliance and now awake.   - likely d/c planning from neurological stand point tomorrow  08/19/2019, 3:26 PM

## 2019-08-19 NOTE — TOC Progression Note (Signed)
Transition of Care [facility]) - Progression Note    Patient Details  Name: [name] MRN: [id number] Date of Birth: 1956-01-17  [name]) CM/SW Contact  Barrie Dunker, RN Phone Number: 08/19/2019, 4:04 PM  Clinical Narrative:     [name] at DSS legal guardian [phone] and left a VM requesting a call back to obtain baseline  information as well as which Group home he resides in, provided my contact information       Expected Discharge Plan and Services                                                 Social Determinants of Health (SDOH) Interventions    Readmission Risk Interventions No flowsheet data found.

## 2019-08-20 DIAGNOSIS — R451 Restlessness and agitation: Secondary | ICD-10-CM

## 2019-08-20 DIAGNOSIS — F209 Schizophrenia, unspecified: Secondary | ICD-10-CM

## 2019-08-20 DIAGNOSIS — R000 Tachycardia, unspecified: Secondary | ICD-10-CM

## 2019-08-20 DIAGNOSIS — E785 Hyperlipidemia, unspecified: Secondary | ICD-10-CM

## 2019-08-20 MED ORDER — DILTIAZEM HCL ER COATED BEADS 240 MG PO CP24
240.0000 mg | ORAL_CAPSULE | Freq: Every day | ORAL | Status: DC
  Administered 2019-08-20: 240 mg via ORAL
  Filled 2019-08-20 (×8): qty 1

## 2019-08-20 MED ORDER — HALOPERIDOL LACTATE 5 MG/ML IJ SOLN: 2.0000 mg | Freq: Four times a day (QID) | INTRAMUSCULAR | Status: DC | PRN
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
## 2019-08-20 NOTE — Progress Notes (Signed)
Patient ID: [name], male   DOB: [birth year], 64 y.o.   MRN: [id number] [facility] PROGRESS NOTE  Romin Divita QMV:[id number] DOB: Nov 15, 1955 DOA: 08/18/2019 [facility]: [facility] [place]  HPI/Subjective: Called by nursing staff that patient was agitated and he refused medications.  Patient has pulled out his IV.  He refused oral medications.  They finally got him sitting down and eating breakfast and he is calm and he mentioned to me that he is interested in a new place to live.  Objective: Vitals:   08/19/19 1733 08/19/19 2326  BP:  91/64  Pulse:  85  Resp:  20  Temp:    SpO2: (!) 2% 99%    Intake/Output Summary (Last 24 hours) at 08/20/2019 0840 Last data filed at 08/20/2019 0300 Gross per 24 hour  Intake 220 ml  Output --  Net 220 ml   Filed Weights   08/18/19 1530  Weight: 95.3 kg    ROS: Review of Systems  Unable to perform ROS: Psychiatric disorder  Respiratory: Negative for shortness of breath.   Cardiovascular: Negative for chest pain.  Gastrointestinal: Negative for abdominal pain.  Musculoskeletal: Negative for joint pain.   Exam: Physical Exam  HENT:  Nose: No mucosal edema.  Mouth/Throat: No oropharyngeal exudate or posterior oropharyngeal edema.  Eyes: Pupils are equal, round, and reactive to light. Conjunctivae and lids are normal.  Neck: Carotid bruit is not present.  Cardiovascular: S1 normal and S2 normal. Tachycardia present. Exam reveals no gallop.  No murmur heard. Pulses:      Dorsalis pedis pulses are 2+ on the right side and 2+ on the left side.  Respiratory: No respiratory distress. He has no wheezes. He has no rhonchi. He has no rales.  GI: Soft. Bowel sounds are normal. There is no abdominal tenderness.  Musculoskeletal:     Right ankle: No swelling.     Left ankle: No swelling.  Lymphadenopathy:    He has no cervical adenopathy.  Neurological: He is alert. No cranial nerve deficit.  Skin: Skin is warm. No rash  noted. Nails show no clubbing.  Psychiatric:  For me did answer questions and allowed me to examine him.      Data Reviewed: Basic Metabolic Panel: Recent Labs  Lab 08/18/19 1534 08/19/19 0508  NA 139 141  K 3.7 3.5  CL 104 108  CO2 18* 25  GLUCOSE 191* 111*  BUN 16 12  CREATININE 1.28* 0.96  CALCIUM 9.0 8.2*   Liver Function Tests: Recent Labs  Lab 08/18/19 1534 08/19/19 0508  AST 25 20  ALT 11 10  ALKPHOS 50 45  BILITOT 1.0 1.1  PROT 7.3 6.2*  ALBUMIN 4.1 3.3*   CBC: Recent Labs  Lab 08/18/19 1534 08/19/19 0508  WBC 5.9 5.6  NEUTROABS 3.6  --   HGB 14.1 12.0*  HCT 42.5 35.9*  MCV 79.7* 78.9*  PLT 263 200    CBG: Recent Labs  Lab 08/18/19 1833 08/19/19 0413 08/19/19 0720 08/19/19 1633  GLUCAP 132* 112* 111* 118*    Recent Results (from the past 240 hour(s))  SARS CORONAVIRUS 2 (TAT 6-24 HRS) Nasopharyngeal Nasopharyngeal Swab     Status: None   Collection Time: 08/18/19  7:26 PM   Specimen: Nasopharyngeal Swab  Result Value Ref Range Status   SARS Coronavirus 2 NEGATIVE NEGATIVE Final    Comment: (NOTE) SARS-CoV-2 target nucleic acids are NOT DETECTED. The SARS-CoV-2 RNA is generally detectable in upper and lower respiratory specimens during the  acute phase of infection. Negative results do not preclude SARS-CoV-2 infection, do not rule out co-infections with other pathogens, and should not be used as the sole basis for treatment or other patient management decisions. Negative results must be combined with clinical observations, patient history, and epidemiological information. The expected result is Negative. Fact Sheet for Patients: HairSlick.no Fact Sheet for Healthcare Providers: quierodirigir.com This test is not yet approved or cleared by the [place] FDA and  has been authorized for detection and/or diagnosis of SARS-CoV-2 by FDA under an Emergency Use Authorization (EUA).  This EUA will remain  in effect (meaning this test can be used) for the duration of the COVID-19 declaration under Section 56 4(b)(1) of the Act, 21 U.S.C. section 360bbb-3(b)(1), unless the authorization is terminated or revoked sooner. Performed at [facility], [address] [place], [place] [address]      Studies: CT Head Wo Contrast  Result Date: 08/18/2019 CLINICAL DATA:  Witnessed seizure. EXAM: CT HEAD WITHOUT CONTRAST TECHNIQUE: Contiguous axial images were obtained from the base of the skull through the vertex without intravenous contrast. COMPARISON:  11/29/2018 FINDINGS: Brain: The brain shows a normal appearance without evidence of malformation, atrophy, old or acute small or large vessel infarction, mass lesion, hemorrhage, hydrocephalus or extra-axial collection. Vascular: No hyperdense vessel. No evidence of atherosclerotic calcification. Skull: Normal.  No traumatic finding.  No focal bone lesion. Sinuses/Orbits: Sinuses are clear. Orbits appear normal. Mastoids are clear. Other: None significant IMPRESSION: Normal head CT Electronically Signed   By: Paulina Fusi M.D.   On: 08/18/2019 16:34    Scheduled Meds: . aspirin EC  81 mg Oral Daily  . benztropine  1 mg Oral BID WC  . clonazePAM  0.5 mg Oral BID WC  . enoxaparin (LOVENOX) injection  40 mg Subcutaneous Q24H  . folic acid  1 mg Oral Daily  . haloperidol  15 mg Oral BID WC  . insulin aspart  0-9 Units Subcutaneous TID WC  . levothyroxine  125 mcg Oral QAC breakfast  . OLANZapine zydis  20 mg Oral BID WC  . phenytoin  300 mg Oral QHS  . simvastatin  20 mg Oral QHS  . vitamin B-12  1,000 mcg Oral Daily   Continuous Infusions:  Assessment/Plan:  1. Agitation with schizophrenia.  Patient pulled out his IV.  As needed Haldol.  Psychiatry following.  Currently for me he was sitting down eating breakfast.  Hopefully he can take oral medications.  Continue oral psychiatric medications if he takes  them. 2. Tachycardia.  Patient too agitated for telemetry monitoring at this point.  Start Cardizem CD if able to take.  Since he pulled out his IV I am limited at this point. 3. Multiple seizures.  Patient loaded with Dilantin.  Patient refused labs this morning.  If he does not take the oral Dilantin we will be in the same situation as we were on presentation with multiple seizures. 4. Hypothyroidism on levothyroxine 5. Acute kidney injury.  Improved with initial IV fluid.  Creatinine improved to 0.96. 6. Hyperlipidemia on simvastatin  Code Status:     Code Status Orders  (From admission, onward)         Start     Ordered   08/18/19 2005  Full code  Continuous     08/18/19 2005        Code Status History    Date Active Date Inactive Code Status Order ID Comments User Context  09/24/2015 1347 10/10/2015 1716 Full Code [id number]  Jimmy Footman, MD Inpatient   Advance Care Planning Activity     Family Communication: Left message for guardian on the phone Disposition Plan: Patient interested in a new place to live.  Will speak with transitional care team if this is possible.  This will also have to go through the patient's guardian.  With patient's agitation, this will need to settle down prior to any disposition.  Consultants:  Neurology  Psychiatry  Time spent: 28 minutes  Javana Schey [employer]

## 2019-08-20 NOTE — Progress Notes (Signed)
Subjective: Patient is still agitated with periods of confusion. No reported seizures overnight.   Past Medical History:  Diagnosis Date  . Diabetes mellitus without complication (HCC)   . HTN (hypertension)   . Hyperammonemia (HCC) While on Depakote  . Hyperlipemia   . Lithium toxicity   . Neutropenia (HCC)   . Schizophrenia (HCC)   . Seizure (HCC) While toxic on Depakote  . Sickle cell trait (HCC)   . Thyroid disease   . TIA (transient ischemic attack) in 2009    History reviewed. No pertinent surgical history.  History reviewed. No pertinent family history.  Social History:  reports that he does not drink alcohol. No history on file for tobacco and drug.  Allergies  Allergen Reactions  . Depakote [Valproic Acid] Other (See Comments)    Reaction:  Hyperammonemia   . Lithium Other (See Comments)    Pt has a prior history of Lithium toxicity.       Medications: I have reviewed the patient's current medications.    Physical Examination: Blood pressure (!) 148/89, pulse 89, temperature 98.2 F (36.8 C), temperature source Oral, resp. rate 20, height 6\' 8"  (2.032 m), weight 95.3 kg, SpO2 100 %.  Doesn't want to participate with examination  Does talk and state his name Follows commands and no clear focality on examination  Moves all extremities symmetrically.    Laboratory Studies:   Basic Metabolic Panel: Recent Labs  Lab 08/18/19 1534 08/19/19 0508  NA 139 141  K 3.7 3.5  CL 104 108  CO2 18* 25  GLUCOSE 191* 111*  BUN 16 12  CREATININE 1.28* 0.96  CALCIUM 9.0 8.2*    Liver Function Tests: Recent Labs  Lab 08/18/19 1534 08/19/19 0508  AST 25 20  ALT 11 10  ALKPHOS 50 45  BILITOT 1.0 1.1  PROT 7.3 6.2*  ALBUMIN 4.1 3.3*   No results for input(s): LIPASE, AMYLASE in the last 168 hours. No results for input(s): AMMONIA in the last 168 hours.  CBC: Recent Labs  Lab 08/18/19 1534 08/19/19 0508  WBC 5.9 5.6  NEUTROABS 3.6  --   HGB 14.1 12.0*   HCT 42.5 35.9*  MCV 79.7* 78.9*  PLT 263 200    Cardiac Enzymes: No results for input(s): CKTOTAL, CKMB, CKMBINDEX, TROPONINI in the last 168 hours.  BNP: Invalid input(s): POCBNP  CBG: Recent Labs  Lab 08/18/19 1833 08/19/19 0413 08/19/19 0720 08/19/19 1633  GLUCAP 132* 112* 111* 118*    Microbiology: Results for orders placed or performed during the hospital encounter of 08/18/19  SARS CORONAVIRUS 2 (TAT 6-24 HRS) Nasopharyngeal Nasopharyngeal Swab     Status: None   Collection Time: 08/18/19  7:26 PM   Specimen: Nasopharyngeal Swab  Result Value Ref Range Status   SARS Coronavirus 2 NEGATIVE NEGATIVE Final    Comment: (NOTE) SARS-CoV-2 target nucleic acids are NOT DETECTED. The SARS-CoV-2 RNA is generally detectable in upper and lower respiratory specimens during the acute phase of infection. Negative results do not preclude SARS-CoV-2 infection, do not rule out co-infections with other pathogens, and should not be used as the sole basis for treatment or other patient management decisions. Negative results must be combined with clinical observations, patient history, and epidemiological information. The expected result is Negative. Fact Sheet for Patients: 10/16/19 Fact Sheet for Healthcare Providers: HairSlick.no This test is not yet approved or cleared by the [place]ir.com FDA and  has been authorized for detection and/or diagnosis of SARS-CoV-2 by FDA  under an Emergency Use Authorization (EUA). This EUA will remain  in effect (meaning this test can be used) for the duration of the COVID-19 declaration under Section 56 4(b)(1) of the Act, 21 U.S.C. section 360bbb-3(b)(1), unless the authorization is terminated or revoked sooner. Performed at [facility], [place] [address], [place], [place] [address]     Coagulation Studies: No results for input(s): LABPROT, INR in the last 72  hours.  Urinalysis: No results for input(s): COLORURINE, LABSPEC, PHURINE, GLUCOSEU, HGBUR, BILIRUBINUR, KETONESUR, PROTEINUR, UROBILINOGEN, NITRITE, LEUKOCYTESUR in the last 168 hours.  Invalid input(s): APPERANCEUR  Lipid Panel:  No results found for: CHOL, TRIG, HDL, CHOLHDL, VLDL, LDLCALC  HgbA1C:  Lab Results  Component Value Date   HGBA1C 5.9 (H) 08/19/2019    Urine Drug Screen:      Component Value Date/Time   LABOPIA NONE DETECTED 11/21/2015 2350   COCAINSCRNUR NONE DETECTED 11/21/2015 2350   LABBENZ NONE DETECTED 11/21/2015 2350   AMPHETMU NONE DETECTED 11/21/2015 2350   THCU NONE DETECTED 11/21/2015 2350   LABBARB NONE DETECTED 11/21/2015 2350    Alcohol Level: No results for input(s): ETH in the last 168 hours.  Other results: EKG: normal EKG, normal sinus rhythm, unchanged from previous tracings.  Imaging: CT Head Wo Contrast  Result Date: 08/18/2019 CLINICAL DATA:  Witnessed seizure. EXAM: CT HEAD WITHOUT CONTRAST TECHNIQUE: Contiguous axial images were obtained from the base of the skull through the vertex without intravenous contrast. COMPARISON:  11/29/2018 FINDINGS: Brain: The brain shows a normal appearance without evidence of malformation, atrophy, old or acute small or large vessel infarction, mass lesion, hemorrhage, hydrocephalus or extra-axial collection. Vascular: No hyperdense vessel. No evidence of atherosclerotic calcification. Skull: Normal.  No traumatic finding.  No focal bone lesion. Sinuses/Orbits: Sinuses are clear. Orbits appear normal. Mastoids are clear. Other: None significant IMPRESSION: Normal head CT Electronically Signed   By: Nelson Chimes M.D.   On: 08/18/2019 16:34     Assessment/Plan:  64 y.o. male with medical history significant of seizure disorder, schizophrenia, diabetes, hypertension, hyperammonemia, history of neutropenia, medication noncompliance who was brought in from a group home with 2 episodes of seizures.  Patient has  been on Depakote for his seizure but doesn't look like he is compliant. Switched from [place] due to psychiatric history.    - Loaded with dilantin with 1500mg  yesterday - Dilantin start today start 300mg  daily for compliance purposes rather then 100 TIDs.  - No need for MRI or EEG as known hx of seizure, non compliance and now awake.     08/20/2019, 10:35 AM

## 2019-08-21 DIAGNOSIS — R21 Rash and other nonspecific skin eruption: Secondary | ICD-10-CM

## 2019-08-21 DIAGNOSIS — N179 Acute kidney failure, unspecified: Secondary | ICD-10-CM

## 2019-08-21 MED ORDER — TRIAMCINOLONE ACETONIDE 0.5 % EX CREA
TOPICAL_CREAM | Freq: Two times a day (BID) | CUTANEOUS | Status: DC
  Filled 2019-08-21: qty 15

## 2019-08-21 NOTE — Progress Notes (Signed)
Physical Therapy Treatment Patient Details Name: [name] MRN: [id number] DOB: 1955/10/23 Today's Date: 08/21/2019    History of Present Illness Per MD note:[name] is a 64 y.o. male with medical history significant of seizure disorder, schizophrenia, diabetes, hypertension, hyperammonemia, history of neutropenia, medication noncompliance who was brought in from a group home with 2 episodes of seizures today    PT Comments    Pt was laying under covers upon arriving with sitter present. Sitter reports that pt has been up to BR earlier. Pt is awake and cooperative throughout. Oriented to self only and is impulsive.Gait belt used throughout for safety. No AD used during ambulation. He was able to ambulate 300 ft in hallway with 3 occasions of unsteadiness but did not require therapist intervention. He was able to safely ascend/descend stairs without use of rails with step through pattern. Pt returned to room with sitter present. Pt is progressing well with PT and will continue to be followed per POC.     Follow Up Recommendations  Home health PT     Equipment Recommendations       Recommendations for Other Services       Precautions / Restrictions Precautions Precautions: Fall Restrictions Weight Bearing Restrictions: No    Mobility  Bed Mobility Overal bed mobility: Independent                Transfers Overall transfer level: Independent                  Ambulation/Gait Ambulation/Gait assistance: Min guard Gait Distance (Feet): 300 Feet Assistive device: None Gait Pattern/deviations: WFL(Within Functional Limits)     General Gait Details: Pt was able to ambulate with CGA for safety with 2 occasions of unsteadiness but did not require intervention. He did not seem fatigued even with ~ 300 ft of ambulation   Stairs Stairs: Yes Stairs assistance: Min guard Stair Management: No rails;Step to pattern Number of Stairs: 4 General stair comments: Pt  was able to safely ascend/descend stairs with CGA only for safety.   Wheelchair Mobility    Modified Rankin (Stroke Patients Only)       Balance                                            Cognition Arousal/Alertness: Awake/alert Behavior During Therapy: Impulsive Overall Cognitive Status: History of cognitive impairments - at baseline                                 General Comments: Pt is oriented only to self however was cooperative and able to follow simple one step commands.      Exercises      General Comments        Pertinent Vitals/Pain Pain Assessment: No/denies pain    Home Living                      Prior Function            PT Goals (current goals can now be found in the care plan section) Acute Rehab PT Goals Patient Stated Goal: no goals stated Progress towards PT goals: Progressing toward goals    Frequency    Min 2X/week      PT Plan Current plan remains appropriate    Co-evaluation  AM-PAC PT "6 Clicks" Mobility   Outcome Measure  Help needed turning from your back to your side while in a flat bed without using bedrails?: None Help needed moving from lying on your back to sitting on the side of a flat bed without using bedrails?: None Help needed moving to and from a bed to a chair (including a wheelchair)?: None Help needed standing up from a chair using your arms (e.g., wheelchair or bedside chair)?: None Help needed to walk in hospital room?: A Little Help needed climbing 3-5 steps with a railing? : A Little 6 Click Score: 22    End of Session Equipment Utilized During Treatment: Gait belt Activity Tolerance: Patient tolerated treatment well Patient left: with nursing/sitter in room;in bed Nurse Communication: Mobility status PT Visit Diagnosis: Difficulty in walking, not elsewhere classified (R26.2)     Time: 1607-3710 PT Time Calculation (min) (ACUTE ONLY): 14  min  Charges:  $Gait Training: 8-22 mins                     Julaine Fusi PTA 08/21/19, 3:46 PM

## 2019-08-21 NOTE — Progress Notes (Signed)
Patient ID: [name], male   DOB: [birth year], 64 y.o.   MRN: [id number] [facility] PROGRESS NOTE  [name] IOE:[id number] DOB: October 11, 1955 DOA: 08/18/2019 PCP: [facility]  HPI/Subjective: Patient was alert and did not answer any of my questions.  He did ask me for cream for his legs where he was itching.  Did not answer any of my other questions.  Refused medications this morning.  Still has sitter at the bedside  Objective: Vitals:   08/20/19 1004 08/21/19 0821  BP: (!) 148/89 (!) 150/90  Pulse: 89 100  Resp:  16  Temp:    SpO2: 100% 100%    Intake/Output Summary (Last 24 hours) at 08/21/2019 1305 Last data filed at 08/21/2019 0900 Gross per 24 hour  Intake 240 ml  Output -  Net 240 ml   Filed Weights   08/18/19 1530  Weight: 95.3 kg    ROS: Review of Systems  Unable to perform ROS: Psychiatric disorder  Skin: Positive for itching.   Exam: Physical Exam  HENT:  Nose: No mucosal edema.  Mouth/Throat: No oropharyngeal exudate or posterior oropharyngeal edema.  Eyes: Pupils are equal, round, and reactive to light. Conjunctivae and lids are normal.  Neck: Carotid bruit is not present.  Cardiovascular: S1 normal and S2 normal. Tachycardia present. Exam reveals no gallop.  No murmur heard. Pulses:      Dorsalis pedis pulses are 2+ on the right side and 2+ on the left side.  Respiratory: No respiratory distress. He has no wheezes. He has no rhonchi. He has no rales.  GI: Soft. Bowel sounds are normal. There is no abdominal tenderness.  Musculoskeletal:     Right ankle: No swelling.     Left ankle: No swelling.  Lymphadenopathy:    He has no cervical adenopathy.  Neurological: He is alert.  Skin: Skin is warm. Nails show no clubbing.  Right lower extremity did have an area of scaling.  Secondary excoriations bilateral lower extremities  Psychiatric:  allowed me to examine him.      Data Reviewed: Basic Metabolic  Panel: Recent Labs  Lab 08/18/19 1534 08/19/19 0508  NA 139 141  K 3.7 3.5  CL 104 108  CO2 18* 25  GLUCOSE 191* 111*  BUN 16 12  CREATININE 1.28* 0.96  CALCIUM 9.0 8.2*   Liver Function Tests: Recent Labs  Lab 08/18/19 1534 08/19/19 0508  AST 25 20  ALT 11 10  ALKPHOS 50 45  BILITOT 1.0 1.1  PROT 7.3 6.2*  ALBUMIN 4.1 3.3*   CBC: Recent Labs  Lab 08/18/19 1534 08/19/19 0508  WBC 5.9 5.6  NEUTROABS 3.6  --   HGB 14.1 12.0*  HCT 42.5 35.9*  MCV 79.7* 78.9*  PLT 263 200    CBG: Recent Labs  Lab 08/18/19 1833 08/19/19 0413 08/19/19 0720 08/19/19 1633  GLUCAP 132* 112* 111* 118*    Recent Results (from the past 240 hour(s))  SARS CORONAVIRUS 2 (TAT 6-24 HRS) Nasopharyngeal Nasopharyngeal Swab     Status: None   Collection Time: 08/18/19  7:26 PM   Specimen: Nasopharyngeal Swab  Result Value Ref Range Status   SARS Coronavirus 2 NEGATIVE NEGATIVE Final    Comment: (NOTE) SARS-CoV-2 target nucleic acids are NOT DETECTED. The SARS-CoV-2 RNA is generally detectable in upper and lower respiratory specimens during the acute phase of infection. Negative results do not preclude SARS-CoV-2 infection, do not rule out co-infections with other pathogens, and should not be used  as the sole basis for treatment or other patient management decisions. Negative results must be combined with clinical observations, patient history, and epidemiological information. The expected result is Negative. Fact Sheet for Patients: SugarRoll.be Fact Sheet for Healthcare Providers: https://www.woods-mathews.com/ This test is not yet approved or cleared by the [place] FDA and  has been authorized for detection and/or diagnosis of SARS-CoV-2 by FDA under an Emergency Use Authorization (EUA). This EUA will remain  in effect (meaning this test can be used) for the duration of the COVID-19 declaration under Section 56 4(b)(1) of the Act,  21 U.S.C. section 360bbb-3(b)(1), unless the authorization is terminated or revoked sooner. Performed at [facility], [place] [address], [place], [place] [address]       Scheduled Meds: . aspirin EC  81 mg Oral Daily  . benztropine  1 mg Oral BID WC  . clonazePAM  0.5 mg Oral BID WC  . diltiazem  240 mg Oral Daily  . enoxaparin (LOVENOX) injection  40 mg Subcutaneous Q24H  . folic acid  1 mg Oral Daily  . haloperidol  15 mg Oral BID WC  . insulin aspart  0-9 Units Subcutaneous TID WC  . levothyroxine  125 mcg Oral QAC breakfast  . OLANZapine zydis  20 mg Oral BID WC  . phenytoin  300 mg Oral QHS  . simvastatin  20 mg Oral QHS  . triamcinolone cream   Topical BID  . vitamin B-12  1,000 mcg Oral Daily   Continuous Infusions:  Assessment/Plan:  1. Schizophrenia.  Patient pulled out his IV.  Refused medications this morning. As needed Haldol.  Psychiatry following.  Patient did not answer any my questions but did ask me for cream for his legs. 2. Tachycardia.  Continue Cardizem CD if able to take.  Since he pulled out his IV I am limited at this point. 3. Multiple seizures.  Patient loaded with Dilantin.  If he does not take the oral Dilantin we will be in the same situation as we were on presentation with multiple seizures. 4. Hypothyroidism on levothyroxine 5. Acute kidney injury.  Improved with initial IV fluid.  Creatinine improved to 0.96. 6. Hyperlipidemia on simvastatin 7. Itching and rash on the right lower extremity.  Triamcinolone cream ordered  Code Status:     Code Status Orders  (From admission, onward)         Start     Ordered   08/18/19 2005  Full code  Continuous     08/18/19 2005        Code Status History    Date Active Date Inactive Code Status Order ID Comments User Context   09/24/2015 1347 10/10/2015 1716 Full Code [id number]  Hildred Priest, MD Inpatient   Advance Care Planning Activity     Family Communication: Spoke with  guardian yesterday Disposition Plan: Patient must be able to take his medications without an issue prior to going back to his group home.  Patient did ask me for another place to live yesterday but likely limited options at this point.  Transitional care team following.  Consultants:  Neurology  Psychiatry  Time spent: 27 minutes  [place]

## 2019-08-21 NOTE — Progress Notes (Signed)
Assumed care of pt. Bedside shift report received from Anne, RN. 

## 2019-08-22 NOTE — Progress Notes (Addendum)
Subjective: Patient is more awake but still confusion.   Past Medical History:  Diagnosis Date  . Diabetes mellitus without complication (HCC)   . HTN (hypertension)   . Hyperammonemia (HCC) While on Depakote  . Hyperlipemia   . Lithium toxicity   . Neutropenia (HCC)   . Schizophrenia (HCC)   . Seizure (HCC) While toxic on Depakote  . Sickle cell trait (HCC)   . Thyroid disease   . TIA (transient ischemic attack) in 2009    History reviewed. No pertinent surgical history.  History reviewed. No pertinent family history.  Social History:  reports that he does not drink alcohol. No history on file for tobacco and drug.  Allergies  Allergen Reactions  . Depakote [Valproic Acid] Other (See Comments)    Reaction:  Hyperammonemia   . Lithium Other (See Comments)    Pt has a prior history of Lithium toxicity.       Medications: I have reviewed the patient's current medications.    Physical Examination: Blood pressure (!) 131/93, pulse 97, temperature 98.9 F (37.2 C), temperature source Oral, resp. rate 16, height 6\' 8"  (2.032 m), weight 95.3 kg, SpO2 100 %.   Neurological Examination   Mental Status: Alert to name only, confused.  Cranial Nerves: II: Discs flat bilaterally; Visual fields grossly normal, pupils equal, round, reactive to light and accommodation III,IV, VI: ptosis not present, extra-ocular motions intact bilaterally V,VII: smile symmetric, facial light touch sensation normal bilaterally VIII: hearing normal bilaterally IX,X: gag reflex present XI: bilateral shoulder shrug XII: midline tongue extension Motor: Generalized weakness bilaterally  Tone and bulk:normal tone throughout; no atrophy noted Sensory: Pinprick and light touch intact throughout, bilaterally   Laboratory Studies:   Basic Metabolic Panel: Recent Labs  Lab 08/18/19 1534 08/19/19 0508  NA 139 141  K 3.7 3.5  CL 104 108  CO2 18* 25  GLUCOSE 191* 111*  BUN 16 12  CREATININE  1.28* 0.96  CALCIUM 9.0 8.2*    Liver Function Tests: Recent Labs  Lab 08/18/19 1534 08/19/19 0508  AST 25 20  ALT 11 10  ALKPHOS 50 45  BILITOT 1.0 1.1  PROT 7.3 6.2*  ALBUMIN 4.1 3.3*   No results for input(s): LIPASE, AMYLASE in the last 168 hours. No results for input(s): AMMONIA in the last 168 hours.  CBC: Recent Labs  Lab 08/18/19 1534 08/19/19 0508  WBC 5.9 5.6  NEUTROABS 3.6  --   HGB 14.1 12.0*  HCT 42.5 35.9*  MCV 79.7* 78.9*  PLT 263 200    Cardiac Enzymes: No results for input(s): CKTOTAL, CKMB, CKMBINDEX, TROPONINI in the last 168 hours.  BNP: Invalid input(s): POCBNP  CBG: Recent Labs  Lab 08/18/19 1833 08/19/19 0413 08/19/19 0720 08/19/19 1633  GLUCAP 132* 112* 111* 118*    Microbiology: Results for orders placed or performed during the hospital encounter of 08/18/19  SARS CORONAVIRUS 2 (TAT 6-24 HRS) Nasopharyngeal Nasopharyngeal Swab     Status: None   Collection Time: 08/18/19  7:26 PM   Specimen: Nasopharyngeal Swab  Result Value Ref Range Status   SARS Coronavirus 2 NEGATIVE NEGATIVE Final    Comment: (NOTE) SARS-CoV-2 target nucleic acids are NOT DETECTED. The SARS-CoV-2 RNA is generally detectable in upper and lower respiratory specimens during the acute phase of infection. Negative results do not preclude SARS-CoV-2 infection, do not rule out co-infections with other pathogens, and should not be used as the sole basis for treatment or other patient management decisions. Negative  results must be combined with clinical observations, patient history, and epidemiological information. The expected result is Negative. Fact Sheet for Patients: SugarRoll.be Fact Sheet for Healthcare Providers: https://www.woods-mathews.com/ This test is not yet approved or cleared by the [place] FDA and  has been authorized for detection and/or diagnosis of SARS-CoV-2 by FDA under an Emergency Use  Authorization (EUA). This EUA will remain  in effect (meaning this test can be used) for the duration of the COVID-19 declaration under Section 56 4(b)(1) of the Act, 21 U.S.C. section 360bbb-3(b)(1), unless the authorization is terminated or revoked sooner. Performed at [facility], [place] [address], [place], [place] [address]     Coagulation Studies: No results for input(s): LABPROT, INR in the last 72 hours.  Urinalysis: No results for input(s): COLORURINE, LABSPEC, PHURINE, GLUCOSEU, HGBUR, BILIRUBINUR, KETONESUR, PROTEINUR, UROBILINOGEN, NITRITE, LEUKOCYTESUR in the last 168 hours.  Invalid input(s): APPERANCEUR  Lipid Panel:  No results found for: CHOL, TRIG, HDL, CHOLHDL, VLDL, LDLCALC  HgbA1C:  Lab Results  Component Value Date   HGBA1C 5.9 (H) 08/19/2019    Urine Drug Screen:      Component Value Date/Time   LABOPIA NONE DETECTED 11/21/2015 2350   COCAINSCRNUR NONE DETECTED 11/21/2015 2350   LABBENZ NONE DETECTED 11/21/2015 2350   AMPHETMU NONE DETECTED 11/21/2015 2350   THCU NONE DETECTED 11/21/2015 2350   LABBARB NONE DETECTED 11/21/2015 2350    Alcohol Level: No results for input(s): ETH in the last 168 hours.  Other results: EKG: normal EKG, normal sinus rhythm, unchanged from previous tracings.  Imaging: No results found.   Assessment/Plan:  64 y.o. male with medical history significant of seizure disorder, schizophrenia, diabetes, hypertension, hyperammonemia, history of neutropenia, medication noncompliance who was brought in from a group home with 2 episodes of seizures.  Patient has been on Depakote for his seizure but doesn't look like he is compliant. Switched from [place] due to psychiatric history.    - Loaded with dilantin with 1500mg  on admission  - Dilantin 300mg  daily for compliance purposes rather then 100 TIDs, but he has been refusing it.  - No need for MRI or EEG as known hx of seizure, non compliance and now awake.   - No further  seizures that I have seen from notes and patient is more awake.    08/22/2019, 11:40 AM

## 2019-08-22 NOTE — Progress Notes (Signed)
Patient ID: [name], male   DOB: 16-Jan-1956, 64 y.o.   MRN: [id number] [facility] PROGRESS NOTE  [name] [id number]:[id number] DOB: 02-Jan-1956 DOA: 08/18/2019 PCP: [facility], [facility]  HPI/Subjective: Continues to refuse medicines.  No nausea no vomiting no fever no chills.  Objective: Vitals:   08/21/19 2341 08/22/19 1742  BP: (!) 131/93 (!) 153/100  Pulse: 97 88  Resp:    Temp:    SpO2: 100% 99%    Intake/Output Summary (Last 24 hours) at 08/22/2019 1925 Last data filed at 08/22/2019 1700 Gross per 24 hour  Intake 1440 ml  Output 3 ml  Net 1437 ml   Filed Weights   08/18/19 1530  Weight: 95.3 kg    ROS: Review of Systems  Unable to perform ROS: Psychiatric disorder  Skin: Positive for itching.   Exam: Physical Exam  HENT:  Nose: No mucosal edema.  Mouth/Throat: No oropharyngeal exudate or posterior oropharyngeal edema.  Eyes: Pupils are equal, round, and reactive to light. Conjunctivae and lids are normal.  Neck: Carotid bruit is not present.  Cardiovascular: S1 normal and S2 normal. Tachycardia present. Exam reveals no gallop.  No murmur heard. Pulses:      Dorsalis pedis pulses are 2+ on the right side and 2+ on the left side.  Respiratory: No respiratory distress. He has no wheezes. He has no rhonchi. He has no rales.  GI: Soft. Bowel sounds are normal. There is no abdominal tenderness.  Musculoskeletal:     Right ankle: No swelling.     Left ankle: No swelling.  Lymphadenopathy:    He has no cervical adenopathy.  Neurological: He is alert.  Skin: Skin is warm. Nails show no clubbing.  Right lower extremity did have an area of scaling.  Secondary excoriations bilateral lower extremities  Psychiatric:  allowed me to examine him.      Data Reviewed: Basic Metabolic Panel: Recent Labs  Lab 08/18/19 1534 08/19/19 0508  NA 139 141  K 3.7 3.5  CL 104 108  CO2 18* 25  GLUCOSE 191* 111*  BUN 16 12  CREATININE 1.28* 0.96   CALCIUM 9.0 8.2*   Liver Function Tests: Recent Labs  Lab 08/18/19 1534 08/19/19 0508  AST 25 20  ALT 11 10  ALKPHOS 50 45  BILITOT 1.0 1.1  PROT 7.3 6.2*  ALBUMIN 4.1 3.3*   CBC: Recent Labs  Lab 08/18/19 1534 08/19/19 0508  WBC 5.9 5.6  NEUTROABS 3.6  --   HGB 14.1 12.0*  HCT 42.5 35.9*  MCV 79.7* 78.9*  PLT 263 200    CBG: Recent Labs  Lab 08/18/19 1833 08/19/19 0413 08/19/19 0720 08/19/19 1633  GLUCAP 132* 112* 111* 118*    Recent Results (from the past 240 hour(s))  SARS CORONAVIRUS 2 (TAT 6-24 HRS) Nasopharyngeal Nasopharyngeal Swab     Status: None   Collection Time: 08/18/19  7:26 PM   Specimen: Nasopharyngeal Swab  Result Value Ref Range Status   SARS Coronavirus 2 NEGATIVE NEGATIVE Final    Comment: (NOTE) SARS-CoV-2 target nucleic acids are NOT DETECTED. The SARS-CoV-2 RNA is generally detectable in upper and lower respiratory specimens during the acute phase of infection. Negative results do not preclude SARS-CoV-2 infection, do not rule out co-infections with other pathogens, and should not be used as the sole basis for treatment or other patient management decisions. Negative results must be combined with clinical observations, patient history, and epidemiological information. The expected result is Negative. Fact Sheet for  Patients: SugarRoll.be Fact Sheet for Healthcare Providers: https://www.woods-mathews.com/ This test is not yet approved or cleared by the [place] FDA and  has been authorized for detection and/or diagnosis of SARS-CoV-2 by FDA under an Emergency Use Authorization (EUA). This EUA will remain  in effect (meaning this test can be used) for the duration of the COVID-19 declaration under Section 56 4(b)(1) of the Act, 21 U.S.C. section 360bbb-3(b)(1), unless the authorization is terminated or revoked sooner. Performed at [facility], [place] [address] [place],  [place] [address]       Scheduled Meds: . aspirin EC  81 mg Oral Daily  . benztropine  1 mg Oral BID WC  . clonazePAM  0.5 mg Oral BID WC  . diltiazem  240 mg Oral Daily  . enoxaparin (LOVENOX) injection  40 mg Subcutaneous Q24H  . folic acid  1 mg Oral Daily  . haloperidol  15 mg Oral BID WC  . insulin aspart  0-9 Units Subcutaneous TID WC  . levothyroxine  125 mcg Oral QAC breakfast  . OLANZapine zydis  20 mg Oral BID WC  . phenytoin  300 mg Oral QHS  . simvastatin  20 mg Oral QHS  . triamcinolone cream   Topical BID  . vitamin B-12  1,000 mcg Oral Daily   Continuous Infusions:  Assessment/Plan:  1. Schizophrenia.  Patient is not taking any of his medicines and concern is patient may not do well outside of the hospital in his group home environment.  Monitor for now until patient is compliant. 2. Tachycardia.  Continue Cardizem CD if able to take.  3. Multiple seizures.  Patient loaded with Dilantin.  Continues to refuse oral Dilantin. 4. Hypothyroidism on levothyroxine 5. Acute kidney injury.  Improved with initial IV fluid.  Creatinine improved to 0.96.  Refused further blood work. 6. Hyperlipidemia on simvastatin 7. Itching and rash on the right lower extremity.  Triamcinolone cream ordered  Code Status:     Code Status Orders  (From admission, onward)         Start     Ordered   08/18/19 2005  Full code  Continuous     08/18/19 2005        Code Status History    Date Active Date Inactive Code Status Order ID Comments User Context   09/24/2015 1347 10/10/2015 1062 Full Code [id number]  Hildred Priest, MD Inpatient   Advance Care Planning Activity     Family Communication: None at bedside Disposition Plan: Patient must be able to take his medications without an issue prior to going back to his group home.    Consultants:  Neurology  Psychiatry  Time spent: 27 minutes  [place]

## 2019-08-23 LAB — GLUCOSE, CAPILLARY: Glucose-Capillary: 121 mg/dL — ABNORMAL HIGH (ref 70–99)

## 2019-08-23 NOTE — Progress Notes (Signed)
PT Cancellation Note  Patient Details Name: [name] MRN: [id number] DOB: [birth year]   Cancelled Treatment:    Reason Eval/Treat Not Completed: Patient declined multiple attempts at PT session this date.  Encouragement provided for patient participation with pt ultimately either declining for various reasons or just refusing to converse.  Will attempt to see pt at a future date/time as medically appropriate.     Ovidio Hanger PT, DPT 08/23/19, 3:38 PM

## 2019-08-23 NOTE — Care Management Important Message (Signed)
Important Message  Patient Details  Name: [name] MRN: [id number] Date of Birth: 06-10-1956   Medicare Important Message Given:  Yes     Olegario Messier A Freddrick Gladson 08/23/2019, 10:32 AM

## 2019-08-23 NOTE — Progress Notes (Signed)
[facility] Progress Note  Patient: [name]    KDX:[id number]  DOA: 08/18/2019     Date of Service: the patient was seen and examined on 08/23/2019  Chief Complaint  Patient presents with  . Seizures   Brief hospital course: Schizophrenia, type II DM, HTN, hyperammonemia, noncompliance, reported seizure disorder.  Patient presented with a seizure event.  Neurology was consulted.  Patient will refuse oral medication therefore patient's medications were changed from Depakote to Dilantin to provide IV option.  Patient continues to refuse medicines. Currently further plan is monitor for improvement in mental status.  Assessment and Plan: 1.  Schizophrenia Patient noncompliant with his medicine regimen outpatient. Continues to remain noncompliant here in the hospital. Was able to take his medicines on 08/22/2019. Psychiatry consulted and recommend no change in medicines at present although will request to see if the patient requires inpatient psychiatric admission given his ongoing noncompliance and hallucination.  2.  Witnessed seizures Patient had his first seizure at group home facility last year. Initially started on Keppra but due to worsening agitation this was discontinued and patient was placed on Depakote. Patient presents to the hospital this time with episode of seizure and reported noncompliance with the medicine with negative Depakote level. Due to national shortage patient was transitioned from Depakote to Dilantin to provide an option to give him IV medicines. Patient was loaded with IV Dilantin, but continues to refuse p.o. Dilantin since then. Neurology recommended no further work-up. Discussed with neurology as well as pharmacy.  We will discontinue the Dilantin as the patient is already not taking this medicine and Depakote level were also negative.  And monitor in the hospital for next any 4 hours.  3.  Acute kidney injury Poor p.o. intake Patient was given IV  fluids. Since then the patient has refused IV and refused blood work therefore unable to determine the status of the patient.  4.  Hyperlipidemia On simvastatin  5.  Hypothyroidism On Synthroid  6.  Noncompliance Patient continues to refuse medication, Lab work, IV placement. Patient remains at risk for poor outcome due to his persistent noncompliance. Psychiatry and neurology both following the patient.  Diet: Regular diet DVT Prophylaxis: Subcutaneous Lovenox   Advance goals of care discussion: Full code  Family Communication: no family was present at bedside, at the time of interview.   Disposition:  Pt is from group home, admitted with confusion noncompliance and seizure, still has noncompliance and confusion, which precludes a safe discharge. Discharge to group home, when he is able to take medications.  Subjective: Reading the Bible, writing multiple lines in 4 pages so far.  Not following any commands.  No acute complaints.  Physical Exam: General:  alert not oriented to time, place, and person.  Appear in mild distress, affect flat in affect ENT: Oral Mucosa Clear, moist  Neck: no JVD,  Cardiovascular: S1 and S2 Present, no Murmur,  Respiratory: good respiratory effort, Bilateral Air entry equal and Decreased, no Crackles, no wheezes Abdomen: Bowel Sound present, Soft and no tenderness,  Skin: no rash Extremities: no Pedal edema, no calf tenderness Neurologic: without any new focal findings  Gait not checked due to patient safety concerns  Vitals:   08/21/19 0821 08/21/19 1645 08/21/19 2341 08/22/19 1742  BP: (!) 150/90 (!) 166/85 (!) 131/93 (!) 153/100  Pulse: 100 85 97 88  Resp: 16 16    Temp:  98.9 F (37.2 C)    TempSrc:  Oral    SpO2: 100%  100% 100% 99%  Weight:      Height:        Intake/Output Summary (Last 24 hours) at 08/23/2019 0844 Last data filed at 08/22/2019 2122 Gross per 24 hour  Intake 1680 ml  Output 3 ml  Net 1677 ml   Filed Weights    08/18/19 1530  Weight: 95.3 kg    Data Reviewed: I have personally reviewed and interpreted daily labs, tele strips, imagings as discussed above. I reviewed all nursing notes, pharmacy notes, vitals, pertinent old records I have discussed plan of care as described above with RN and patient/family.  CBC: Recent Labs  Lab 08/18/19 1534 08/19/19 0508  WBC 5.9 5.6  NEUTROABS 3.6  --   HGB 14.1 12.0*  HCT 42.5 35.9*  MCV 79.7* 78.9*  PLT 263 200   Basic Metabolic Panel: Recent Labs  Lab 08/18/19 1534 08/19/19 0508  NA 139 141  K 3.7 3.5  CL 104 108  CO2 18* 25  GLUCOSE 191* 111*  BUN 16 12  CREATININE 1.28* 0.96  CALCIUM 9.0 8.2*    Studies: No results found.  Scheduled Meds: . aspirin EC  81 mg Oral Daily  . benztropine  1 mg Oral BID WC  . clonazePAM  0.5 mg Oral BID WC  . diltiazem  240 mg Oral Daily  . enoxaparin (LOVENOX) injection  40 mg Subcutaneous Q24H  . folic acid  1 mg Oral Daily  . haloperidol  15 mg Oral BID WC  . insulin aspart  0-9 Units Subcutaneous TID WC  . levothyroxine  125 mcg Oral QAC breakfast  . OLANZapine zydis  20 mg Oral BID WC  . phenytoin  300 mg Oral QHS  . simvastatin  20 mg Oral QHS  . triamcinolone cream   Topical BID  . vitamin B-12  1,000 mcg Oral Daily   Continuous Infusions: PRN Meds: haloperidol lactate, haloperidol lactate, hydrOXYzine, LORazepam, ondansetron **OR** ondansetron (ZOFRAN) IV  Time spent: 35 minutes  Author: Lynden Oxford, MD [facility] 08/23/2019 8:44 AM  To reach On-call, see care teams to locate the attending and reach out to them via [email or web address]. If 7PM-7AM, please contact night-coverage If you still have difficulty reaching the attending provider, please page the [facility] (Director on Call) for [facility] on amion for assistance.

## 2019-08-24 NOTE — Progress Notes (Signed)
[facility] Progress Note  Patient: [name]    BZJ:[id number]  DOA: 08/18/2019     Date of Service: the patient was seen and examined on 08/24/2019  Chief Complaint  Patient presents with  . Seizures   Brief hospital course: Schizophrenia, type II DM, HTN, hyperammonemia, noncompliance, reported seizure disorder.  Patient presented with a seizure event.  Neurology was consulted.  Patient will refuse oral medication therefore patient's medications were changed from Depakote to Dilantin to provide IV option.  Patient continues to refuse medicines. Currently further plan is monitor for improvement in mental status.  Assessment and Plan: 1.  Schizophrenia Patient noncompliant with his medicine regimen outpatient. Continues to remain noncompliant here in the hospital. Was able to take his medicines on 08/22/2019. --Re-consult psych today for capacity  2.  Witnessed seizures Patient had his first seizure at group home facility last year. Initially started on Keppra but due to worsening agitation this was discontinued and patient was placed on Depakote. Patient presents to the hospital this time with episode of seizure and reported noncompliance with the medicine with negative Depakote level. Due to national shortage patient was transitioned from Depakote to Dilantin to provide an option to give him IV medicines. Patient was loaded with IV Dilantin, but continues to refuse p.o. Dilantin since then. Neurology recommended no further work-up. Discussed with neurology as well as pharmacy.  We will discontinue the Dilantin as the patient is already not taking this medicine and Depakote level were also negative.    3.  Acute kidney injury 2/2 Poor p.o. intake, now better Patient was given IV fluids. Since then the patient has refused IV and refused blood work therefore unable to determine the status of the patient.  4.  Hyperlipidemia On simvastatin  5.  Hypothyroidism On Synthroid   6.  Noncompliance Patient continues to refuse medication, Lab work, IV placement. Patient remains at risk for poor outcome due to his persistent noncompliance. Psychiatry and neurology both following the patient.  Diet: Regular diet DVT Prophylaxis: Subcutaneous Lovenox   Advance goals of care discussion: Full code  Family Communication: no family was present at bedside, at the time of interview.   Disposition:  Pt is from group home, admitted with confusion noncompliance and seizure, still has noncompliance and confusion, which precludes a safe discharge. Discharge to group home, when he is able to take medications.  Psych re-consult today for capacity.     Subjective:  Pt was seen walking the halls, with sitter following.  Refused to look at me or answer any questions.  Nursing noted good appetite.  No fever, N/V/D. Took most of his morning meds.  Asked psych to re-eval pt.   Physical Exam: Constitutional: NAD, alert, refused to answer any questions or follow commands HEENT: conjunctivae and lids normal, EOMI CV:  No cyanosis.   RESP: normal respiratory effort  MSK: normal ROM and strength SKIN: warm, dry and intact Neuro: II - XII grossly intact.      Vitals:   08/21/19 2341 08/22/19 1742 08/23/19 1629 08/24/19 0438  BP: (!) 131/93 (!) 153/100 (!) 150/94 130/86  Pulse: 97 88 86 84  Resp:   18 18  Temp:      TempSrc:      SpO2: 100% 99% 100% 100%  Weight:      Height:        Intake/Output Summary (Last 24 hours) at 08/24/2019 1813 Last data filed at 08/24/2019 0930 Gross per 24 hour  Intake 1320  ml  Output -  Net 1320 ml   Filed Weights   08/18/19 1530  Weight: 95.3 kg    Data Reviewed: I have personally reviewed and interpreted daily labs, tele strips, imagings as discussed above. I reviewed all nursing notes, pharmacy notes, vitals, pertinent old records I have discussed plan of care as described above with RN and patient/family.  CBC: Recent Labs   Lab 08/18/19 1534 08/19/19 0508  WBC 5.9 5.6  NEUTROABS 3.6  --   HGB 14.1 12.0*  HCT 42.5 35.9*  MCV 79.7* 78.9*  PLT 263 200   Basic Metabolic Panel: Recent Labs  Lab 08/18/19 1534 08/19/19 0508  NA 139 141  K 3.7 3.5  CL 104 108  CO2 18* 25  GLUCOSE 191* 111*  BUN 16 12  CREATININE 1.28* 0.96  CALCIUM 9.0 8.2*    Studies: No results found.  Scheduled Meds: . aspirin EC  81 mg Oral Daily  . benztropine  1 mg Oral BID WC  . clonazePAM  0.5 mg Oral BID WC  . diltiazem  240 mg Oral Daily  . enoxaparin (LOVENOX) injection  40 mg Subcutaneous Q24H  . folic acid  1 mg Oral Daily  . haloperidol  15 mg Oral BID WC  . insulin aspart  0-9 Units Subcutaneous TID WC  . levothyroxine  125 mcg Oral QAC breakfast  . OLANZapine zydis  20 mg Oral BID WC  . simvastatin  20 mg Oral QHS  . triamcinolone cream   Topical BID  . vitamin B-12  1,000 mcg Oral Daily   Continuous Infusions: PRN Meds: haloperidol lactate, haloperidol lactate, hydrOXYzine, LORazepam, ondansetron **OR** ondansetron (ZOFRAN) IV  Darlin Priestly, MD [facility] 08/24/2019 6:13 PM  To reach On-call, see care teams to locate the attending and reach out to them via [email or web address]. If 7PM-7AM, please contact night-coverage If you still have difficulty reaching the attending provider, please page the [facility] (Director on Call) for [facility] on amion for assistance.

## 2019-08-24 NOTE — Consult Note (Signed)
[facility] Face-to-Face Psychiatry Consult   Reason for Consult:  Capacity  Referring Physician:  Dr. Billie Ruddy Patient Identification: [name] MRN:  [id number] Principal Diagnosis: Seizure [place]) Diagnosis:  Principal Problem:   Seizure ([place]) Active Problems:   Diabetes ([place])   Hypothyroid   Schizophrenia ([place])   Hyperlipidemia   HTN (hypertension)   Noncompliance   Recurrent seizures ([place])   Agitation   Tachycardia   AKI (acute kidney injury) ([place])   Rash   Total Time spent with patient: 30 minutes  Subjective:   [name] is a 64 y.o. male patient admitted for seizures.  HPI: Patient is a chronic schizophrenic on multiple medications who presents with seizures due to noncompliance with medications.  Psychiatry was following patient who was initially too somnolent to participate in interview.    Today on assessment patient with chronic delusions and tangential speech.  Despite this he denies any suicidal or homicidal ideation.  He is today recognizing that he is in the hospital and that he will need to take medications for his condition.  He does however not understand the severity of his condition or how his noncompliance has led to his current clinical status.  Patient has a long history of psychotic symptoms and is why he lives in a group home and is under the custody of the state.  He is more than likely at his baseline.  Past Psychiatric History: Long history of schizoaffective illness, multiple inpatient hospitalizations  Risk to Self:  No  Risk to Others:  No Prior Inpatient Therapy:  Yes Prior Outpatient Therapy:  Yes  Past Medical History:  Past Medical History:  Diagnosis Date  . Diabetes mellitus without complication ([place])   . HTN (hypertension)   . Hyperammonemia ([facility]) While on Depakote  . Hyperlipemia   . Lithium toxicity   . Neutropenia ([place])   . Schizophrenia ([place])   . Seizure ([place]) While toxic on Depakote  . Sickle cell trait ([place])   . Thyroid disease    . TIA (transient ischemic attack) in 2009   History reviewed. No pertinent surgical history. Family History: History reviewed. No pertinent family history. Family Psychiatric  History: Unknown Social History:  Social History   Substance and Sexual Activity  Alcohol Use No     Social History   Substance and Sexual Activity  Drug Use Not on file    Social History   Socioeconomic History  . Marital status: Single    Spouse name: Not on file  . Number of children: Not on file  . Years of education: Not on file  . Highest education level: Not on file  Occupational History  . Not on file  Tobacco Use  . Smoking status: Unknown If Ever Smoked  Substance and Sexual Activity  . Alcohol use: No  . Drug use: Not on file  . Sexual activity: Not on file  Other Topics Concern  . Not on file  Social History Narrative  . Not on file   Social Determinants of Health   Financial Resource Strain:   . Difficulty of Paying Living Expenses: Not on file  Food Insecurity:   . Worried About Charity fundraiser in the Last Year: Not on file  . Ran Out of Food in the Last Year: Not on file  Transportation Needs:   . Lack of Transportation (Medical): Not on file  . Lack of Transportation (Non-Medical): Not on file  Physical Activity:   . Days of Exercise per Week:  Not on file  . Minutes of Exercise per Session: Not on file  Stress:   . Feeling of Stress : Not on file  Social Connections:   . Frequency of Communication with Friends and Family: Not on file  . Frequency of Social Gatherings with Friends and Family: Not on file  . Attends Religious Services: Not on file  . Active Member of Clubs or Organizations: Not on file  . Attends Banker Meetings: Not on file  . Marital Status: Not on file   Additional Social History:    Allergies:   Allergies  Allergen Reactions  . Depakote [Valproic Acid] Other (See Comments)    Reaction:  Hyperammonemia   . Lithium Other (See  Comments)    Pt has a prior history of Lithium toxicity.       Labs: No results found for this or any previous visit (from the past 48 hour(s)).  Current Facility-Administered Medications  Medication Dose Route Frequency Provider Last Rate Last Admin  . aspirin EC tablet 81 mg  81 mg Oral Daily Rometta Emery, MD   81 mg at 08/24/19 0857  . benztropine (COGENTIN) tablet 1 mg  1 mg Oral BID WC Rometta Emery, MD   1 mg at 08/24/19 0852  . clonazePAM (KLONOPIN) tablet 0.5 mg  0.5 mg Oral BID WC Earlie Lou L, MD   0.5 mg at 08/24/19 0852  . diltiazem (CARDIZEM CD) 24 hr capsule 240 mg  240 mg Oral Daily Alford Highland, MD   240 mg at 08/20/19 0953  . enoxaparin (LOVENOX) injection 40 mg  40 mg Subcutaneous Q24H Mikeal Hawthorne, Mohammad L, MD      . folic acid (FOLVITE) tablet 1 mg  1 mg Oral Daily Earlie Lou L, MD   1 mg at 08/24/19 0857  . haloperidol (HALDOL) tablet 15 mg  15 mg Oral BID WC Earlie Lou L, MD   15 mg at 08/24/19 9518  . haloperidol lactate (HALDOL) injection 2 mg  2 mg Intravenous Q6H PRN Rolly Salter, MD      . haloperidol lactate (HALDOL) injection 2 mg  2 mg Intramuscular Q6H PRN Wieting, Richard, MD      . hydrOXYzine (ATARAX/VISTARIL) tablet 50 mg  50 mg Oral BID PRN Rometta Emery, MD   50 mg at 08/20/19 0956  . insulin aspart (novoLOG) injection 0-9 Units  0-9 Units Subcutaneous TID WC Garba, Mohammad L, MD      . levothyroxine (SYNTHROID) tablet 125 mcg  125 mcg Oral QAC breakfast Rometta Emery, MD   125 mcg at 08/24/19 0852  . LORazepam (ATIVAN) injection 2 mg  2 mg Intravenous Q4H PRN Earlie Lou L, MD      . OLANZapine zydis (ZYPREXA) disintegrating tablet 20 mg  20 mg Oral BID WC Rometta Emery, MD   20 mg at 08/24/19 0852  . ondansetron (ZOFRAN) tablet 4 mg  4 mg Oral Q6H PRN Rometta Emery, MD       Or  . ondansetron (ZOFRAN) injection 4 mg  4 mg Intravenous Q6H PRN Earlie Lou L, MD      . simvastatin (ZOCOR) tablet 20 mg  20  mg Oral QHS Rometta Emery, MD   20 mg at 08/23/19 2149  . triamcinolone cream (KENALOG) 0.5 %   Topical BID Wieting, Richard, MD      . vitamin B-12 (CYANOCOBALAMIN) tablet 1,000 mcg  1,000 mcg Oral Daily Garba, Mohammad L,  MD   1,000 mcg at 08/24/19 0857    Musculoskeletal: Strength & Muscle Tone: within normal limits Gait & Station: normal Patient leans: N/A  Psychiatric Specialty Exam: Physical Exam  Review of Systems  Blood pressure 130/86, pulse 84, temperature 98.9 F (37.2 C), temperature source Oral, resp. rate 18, height 6\' 8"  (2.032 m), weight 95.3 kg, SpO2 100 %.Body mass index is 23.07 kg/m.  General Appearance: Bizarre  Eye Contact:  Fair  Speech:  Garbled  Volume:  Decreased  Mood:  Euthymic  Affect:  Labile  Thought Process:  Coherent and Disorganized  Orientation:  Full (Time, Place, and Person)  Thought Content:  Delusions and Hallucinations: Auditory  Suicidal Thoughts:  No  Homicidal Thoughts:  No  Memory:  Recent;   Fair  Judgement:  Poor  Insight:  Shallow  Psychomotor Activity:  Normal  Concentration:  Concentration: Fair  Recall:  of Knowledge:  Poor  Language:  Poor  Akathisia:  No  Handed:  Right  AIMS (if indicated):     Assets:  Resilience  ADL's:  Impaired  Cognition:  Impaired,  Mild  Sleep:        Treatment Plan Summary: Patient is a 64 year old male with history of schizophrenia who was presenting to the hospital with seizure-like symptoms in the context of medication noncompliance.  Patient does display some psychiatric symptoms including hallucinations and delusions particularly religious focus towards, however, this is likely the patient's baseline.  He is not endorsing any dangerous symptoms including no suicidal or homicidal ideation.  From a psychiatric perspective he is best served at the group home, with staff he is familiar with and who are patient and have the time to work with patient so he will be compliant.   Inpatient psychiatric hospitalization not necessary at this time.   Regarding the question of capacity, patient does not have capacity to make medical decisions.  He is unable to appreciate the severity of his symptoms as well as unable to provide reasons that are logically sound for not being compliant with his medications.  Patient will occasionally change his mind and choose to be compliant.  Patient's lability with regards to taking his medication is another reason why he will be better served in the group home.   Disposition: No evidence of imminent risk to self or others at present.   Patient does not meet criteria for psychiatric inpatient admission. Supportive therapy provided about ongoing stressors. Discussed crisis plan, support from social network, calling 911, coming to the Emergency Department, and calling Suicide Hotline.  77, MD 08/24/2019 2:28 PM

## 2019-08-24 NOTE — Progress Notes (Addendum)
*  Sitter Note* Patient calm throughout the night. Rambling frequently about God and Church. Patient ate 100% of a sandwich tray, ambulated independently in the room, no aggressive or impulsive behaviors noted. Will continue to monitor as one to one sitter.

## 2019-08-24 NOTE — Progress Notes (Signed)
Patient refused blood draw this morning. Patient states "I believe in God, not nurses and doctors" patient also states he doesn't want someone "examening his cells". Patient calm and resting in bed. One on one monitoring continued.

## 2019-08-24 NOTE — TOC Progression Note (Signed)
Transition of Care [facility]) - Progression Note    Patient Details  Name: [name] MRN: [id number] Date of Birth: 1956/01/24  Transition [facility] Care [facility]) CM/SW Contact  Barrie Dunker, RN Phone Number: 08/24/2019, 9:35 AM  Clinical Narrative:     The plan is for the patient to return [facility] [place], Doristine Counter is the contact person at the group home, the patient is ambulating in the room  He is still rambling about God and Church per the nurses notes, he is refusing PT, and blood draws, DSS has legal guardian over the patient, I have spoken with Eustaquio Boyden at [employer] [phone]      Expected Discharge Plan and Services                                                 Social Determinants of Health (SDOH) Interventions    Readmission Risk Interventions No flowsheet data found.

## 2019-08-25 NOTE — TOC Progression Note (Signed)
Transition of Care [facility]) - Progression Note    Patient Details  Name: Alanzo Lamb MRN: [id number] Date of Birth: 11-08-1955  Transition of Care [facility]) CM/SW Contact  Barrie Dunker, RN Phone Number: 08/25/2019, 2:49 PM  Clinical Narrative:   Patient to DC back to [facility] today, [name] is sending someone to transport and will call when they arrive for the patient to be brought to the entrance.  FL2 completed and put with the DC papers.  I called and left Carly Resher the legal guardian a VM to notify her that he is returning today, no additional needs         Expected Discharge Plan and Services           Expected Discharge Date: 08/25/19                                     Social Determinants of Health (SDOH) Interventions    Readmission Risk Interventions No flowsheet data found.

## 2019-08-25 NOTE — TOC Progression Note (Signed)
Transition of Care [facility]) - Progression Note    Patient Details  Name: [name] MRN: [id number] Date of Birth: 1955/09/24  Transition of Care [facility]) CM/SW Contact  Barrie Dunker, RN Phone Number: 08/25/2019, 1:54 PM  Clinical Narrative:   [name] from the group home called to check on the patient she stated that they will accept the patient back even with having a sitter here at the hospital, She will send a person to transport and will call the nurses desk once they arrive out front, I notified the physician that they will accept the patient today and she can go ahead and write the DC         Expected Discharge Plan and Services           Expected Discharge Date: 08/25/19                                     Social Determinants of Health (SDOH) Interventions    Readmission Risk Interventions No flowsheet data found.

## 2019-08-25 NOTE — NC FL2 (Signed)
[place] MEDICAID FL2 LEVEL OF CARE SCREENING TOOL     IDENTIFICATION  Patient Name: [name] Birthdate: [birth year] Sex: male Admission Date (Current Location): 08/18/2019  [place] and [place] Number:  Chiropodist and Address:  [facility], [address] [place], [place] [address]      Provider Number: [id number]  Attending Physician Name and Address:  Darlin Priestly, MD  Relative Name and Phone Number:  Eustaquio Boyden Legal guardian [phone]    Current Level of Care: Hospital Recommended Level of Care: Other (Comment)([facility]) Prior Approval Number:    Date Approved/Denied:   PASRR Number:    Discharge Plan:   Group Home   Current Diagnoses: Patient Active Problem List   Diagnosis Date Noted  . AKI (acute kidney injury) (HCC)   . Rash   . Agitation   . Tachycardia   . Recurrent seizures (HCC) 08/19/2019  . Seizure (HCC) 08/18/2019  . Tardive dyskinesia 11/21/2015  . Noncompliance 11/21/2015  . HTN (hypertension) 10/10/2015  . Hyperlipidemia 09/25/2015  . Schizophrenia (HCC)   . Diabetes (HCC) 09/16/2015  . Hypothyroid 09/16/2015    Orientation RESPIRATION BLADDER Height & Weight     Self, Time, Situation, Place    Continent Weight: 95.3 kg Height:  6\' 8"  (203.2 cm)  BEHAVIORAL SYMPTOMS/MOOD NEUROLOGICAL BOWEL NUTRITION STATUS      Continent Diet(carb modified)  AMBULATORY STATUS COMMUNICATION OF NEEDS Skin     Verbally Normal                       Personal Care Assistance Level of Assistance              Functional Limitations Info             SPECIAL CARE FACTORS FREQUENCY                       Contractures Contractures Info: Not present    Additional Factors Info  Allergies   Allergies Info: Depakote, Lithium           Current Medications (08/25/2019):  This is the current hospital active medication list  TAKE these medications   aspirin EC 81 MG tablet Take 81 mg  by mouth daily.   benztropine 1 MG tablet Commonly known as: COGENTIN Take 1 tablet (1 mg total) by mouth 2 (two) times daily with a meal.   clonazePAM 0.5 MG tablet Commonly known as: KLONOPIN Take 1 tablet (0.5 mg total) by mouth 2 (two) times daily with a meal.   divalproex 125 MG capsule Commonly known as: DEPAKOTE SPRINKLE Take 8 capsules (1,000 mg total) by mouth 2 (two) times daily with a meal.   folic acid 1 MG tablet Commonly known as: FOLVITE Take 1 mg by mouth daily.   haloperidol 10 MG tablet Commonly known as: HALDOL Take 1.5 tablets (15 mg total) by mouth 2 (two) times daily with a meal.   hydrOXYzine 50 MG tablet Commonly known as: ATARAX/VISTARIL Take 50 mg by mouth 2 (two) times daily as needed for anxiety.   levothyroxine 125 MCG tablet Commonly known as: SYNTHROID Take 1 tablet (125 mcg total) by mouth daily before breakfast.   metFORMIN 1000 MG tablet Commonly known as: GLUCOPHAGE Take 1 tablet (1,000 mg total) by mouth 2 (two) times daily with a meal.   OLANZapine zydis 20 MG disintegrating tablet Commonly known as: ZYPREXA Take 1 tablet (20 mg  total) by mouth 2 (two) times daily with a meal.   simvastatin 20 MG tablet Commonly known as: ZOCOR Take 20 mg by mouth at bedtime.   vitamin B-12 1000 MCG tablet Commonly known as: CYANOCOBALAMIN Take 1,000 mcg by mouth daily.     Discharge Medications: Please see discharge summary for a list of discharge medications.  Relevant Imaging Results:  Relevant Lab Results:   Additional Information Patient has a legal guardian  Su Hilt, RN

## 2019-08-25 NOTE — Progress Notes (Signed)
Pt refused all PO meds despite efforts to encourage pt to take meds, offered ice cream and other items to encourage PO meds but fail. PO meds was offered 2x. To continue monitor pt. MD made-aware.

## 2019-08-25 NOTE — Progress Notes (Signed)
Pt for DC today. Report given to Dois Davenport from the group home where the pt is from.

## 2019-08-25 NOTE — Care Management Important Message (Signed)
Important Message  Patient Details  Name: [name] MRN: [id number] Date of Birth: 20-Mar-1956   Medicare Important Message Given:  Yes  I talked with Carly Resher at [facility] [employer] [phone]) as they have guardianship for Mr. Ackers.  I reviewed the Important Message from Medicare with her and she was in agreement for  him to return to the group home at discharge and understood the patient's right to appeal if she felt he was being discharged too soon.  Olegario Messier A Lleyton Byers 08/25/2019, 11:05 AM

## 2019-08-25 NOTE — NC FL2 (Signed)
[place] MEDICAID FL2 LEVEL OF CARE SCREENING TOOL     IDENTIFICATION  Patient Name: [name] Birthdate: Dec 28, 1955 Sex: male Admission Date (Current Location): 08/18/2019  [place] and [place] Number:  Chiropodist and Address:  [facility], [address], [place], [place] [address]      Provider Number: [id number]  Attending Physician Name and Address:  Darlin Priestly, MD  Relative Name and Phone Number:  Eustaquio Boyden Legal guardian [phone]    Current Level of Care: Hospital Recommended Level of Care: Other (Comment)([facility]) Prior Approval Number:    Date Approved/Denied:   PASRR Number:    Discharge Plan:      Current Diagnoses: Patient Active Problem List   Diagnosis Date Noted  . AKI (acute kidney injury) (HCC)   . Rash   . Agitation   . Tachycardia   . Recurrent seizures (HCC) 08/19/2019  . Seizure (HCC) 08/18/2019  . Tardive dyskinesia 11/21/2015  . Noncompliance 11/21/2015  . HTN (hypertension) 10/10/2015  . Hyperlipidemia 09/25/2015  . Schizophrenia (HCC)   . Diabetes (HCC) 09/16/2015  . Hypothyroid 09/16/2015    Orientation RESPIRATION BLADDER Height & Weight     Self, Time, Situation, Place    Continent Weight: 95.3 kg Height:  6\' 8"  (203.2 cm)  BEHAVIORAL SYMPTOMS/MOOD NEUROLOGICAL BOWEL NUTRITION STATUS      Continent Diet(carb modified)  AMBULATORY STATUS COMMUNICATION OF NEEDS Skin     Verbally Normal                       Personal Care Assistance Level of Assistance              Functional Limitations Info             SPECIAL CARE FACTORS FREQUENCY                       Contractures Contractures Info: Not present    Additional Factors Info  Allergies   Allergies Info: Depakote, Lithium           Current Medications (08/25/2019):  This is the current hospital active medication list Current Facility-Administered Medications  Medication Dose Route  Frequency Provider Last Rate Last Admin  . aspirin EC tablet 81 mg  81 mg Oral Daily 08/27/2019, MD   Stopped at 08/25/19 [phone]  . benztropine (COGENTIN) tablet 1 mg  1 mg Oral BID WC [id number], MD   Stopped at 08/25/19 [phone]  . clonazePAM (KLONOPIN) tablet 0.5 mg  0.5 mg Oral BID WC 0258, MD   Stopped at 08/25/19 [phone]  . diltiazem (CARDIZEM CD) 24 hr capsule 240 mg  240 mg Oral Daily [id number], MD   240 mg at 08/20/19 0953  . enoxaparin (LOVENOX) injection 40 mg  40 mg Subcutaneous Q24H Garba, Mohammad L, MD      . folic acid (FOLVITE) tablet 1 mg  1 mg Oral Daily 10/18/19 L, MD   1 mg at 08/24/19 0857  . haloperidol (HALDOL) tablet 15 mg  15 mg Oral BID WC 08/26/19 L, MD   15 mg at 08/24/19 1735  . haloperidol lactate (HALDOL) injection 2 mg  2 mg Intravenous Q6H PRN 08/26/19, MD      . haloperidol lactate (HALDOL) injection 2 mg  2 mg Intramuscular Q6H PRN Rolly Salter, MD      . hydrOXYzine (ATARAX/VISTARIL) tablet  50 mg  50 mg Oral BID PRN Elwyn Reach, MD   50 mg at 08/20/19 0956  . insulin aspart (novoLOG) injection 0-9 Units  0-9 Units Subcutaneous TID WC Garba, Mohammad L, MD      . levothyroxine (SYNTHROID) tablet 125 mcg  125 mcg Oral QAC breakfast Elwyn Reach, MD   125 mcg at 08/24/19 0852  . LORazepam (ATIVAN) injection 2 mg  2 mg Intravenous Q4H PRN Gala Romney L, MD      . OLANZapine zydis (ZYPREXA) disintegrating tablet 20 mg  20 mg Oral BID WC Gala Romney L, MD   20 mg at 08/24/19 1734  . ondansetron (ZOFRAN) tablet 4 mg  4 mg Oral Q6H PRN Elwyn Reach, MD       Or  . ondansetron (ZOFRAN) injection 4 mg  4 mg Intravenous Q6H PRN Gala Romney L, MD      . simvastatin (ZOCOR) tablet 20 mg  20 mg Oral QHS Elwyn Reach, MD   20 mg at 08/23/19 2149  . triamcinolone cream (KENALOG) 0.5 %   Topical BID Loletha Grayer, MD   Given at 08/25/19 [phone]  . vitamin B-12 (CYANOCOBALAMIN) tablet 1,000 mcg   1,000 mcg Oral Daily Elwyn Reach, MD   1,000 mcg at 08/24/19 0923     Discharge Medications: Please see discharge summary for a list of discharge medications.  Relevant Imaging Results:  Relevant Lab Results:   Additional Information Patient has a legal guardian  Su Hilt, RN

## 2019-08-25 NOTE — Discharge Summary (Signed)
Physician Discharge Summary   [name]  male DOB: [birth year]  AOZ:[id number]  PCP: [facility]  Admit date: 08/18/2019 Discharge date: 08/25/2019  Admitted From: group home [facility]position:  [facility].  Pt is a high risk for re-admission due to his medication non-compliance, however, currently does not need any more inpatient management, and group home has agreed to take pt back and resume care of him there.  CODE STATUS: Full code  Discharge Instructions    Diet - low sodium heart healthy   Complete by: As directed    Discharge instructions   Complete by: As directed    If pt continues to refuse his psych and seizure medications after discharge, he will need to follow up with outpatient psych and neurology to see about monthly injections for his psych and seizure meds.  [facility] Course:  For full details, please see H&P, progress notes, consult notes and ancillary notes.  Briefly,  [name] is a 64 y.o. AA male with hx of Schizophrenia, type II DM, HTN, hyperammonemia, noncompliance, reported seizure disorder.  Patient presented with a seizure event.  Neurology was consulted.  Witnessed seizures Patient had his first seizure at group home facility last year.  Initially started on Keppra but due to worsening agitation this was discontinued and patient was placed on Depakote.  Patient presented to the hospital this time with episode of seizure and reported noncompliance with the medicine with negative Depakote level.  Pt was loaded with IV Dilantin due to national shortage of IV Depakote.  Pt continued to refuse most of his PO meds while in the hospital, however, has been seizure-free since presentation.  Pt is discharged back on his home seizure regimen.  If he continues to be non-compliant after discharge, he will need to follow up with outpatient neurology to be considered for a monthly injection of his seizure  medications.  Schizophrenia Patient noncompliant with his medicine regimen outpatient.  Pt remained noncompliant here in the hospital.  Psych was consulted, and deemed pt at his baseline prior to discharge.  Psych also confirmed that pt has no decision-making capacity, and decisions should be directed at his guardian.  If pt continues to be non-compliant with his psych meds after discharge, he will need to follow up with outpatient psych to be considered for a monthly injection of his psych medications.  Acute kidney injury, ruled out Cr 1.28 on presentation, which does not meet criteria for AKI.  Baseline Cr ~1.  The day after presentation, Cr trended down to 0.96 baseline.  No more lab check since pt refused labs.  Hyperlipidemia Supposed to be on simvastatin  Hypothyroidism Supposed to be on Synthroid   Discharge Diagnoses:  Principal Problem:   Seizure (HCC) Active Problems:   Diabetes (HCC)   Hypothyroid   Schizophrenia (HCC)   Hyperlipidemia   HTN (hypertension)   Noncompliance   Recurrent seizures (HCC)   Agitation   Tachycardia   AKI (acute kidney injury) (HCC)   Rash    Discharge Instructions:  Allergies as of 08/25/2019      Reactions   Depakote [valproic Acid] Other (See Comments)   Reaction:  Hyperammonemia    Lithium Other (See Comments)   Pt has a prior history of Lithium toxicity.         Medication List    STOP taking these medications   lisinopril 2.5 MG tablet Commonly known as:  ZESTRIL     TAKE these medications   aspirin EC 81 MG tablet Take 81 mg by mouth daily.   benztropine 1 MG tablet Commonly known as: COGENTIN Take 1 tablet (1 mg total) by mouth 2 (two) times daily with a meal.   clonazePAM 0.5 MG tablet Commonly known as: KLONOPIN Take 1 tablet (0.5 mg total) by mouth 2 (two) times daily with a meal.   divalproex 125 MG capsule Commonly known as: DEPAKOTE SPRINKLE Take 8 capsules (1,000 mg total) by mouth 2 (two) times daily  with a meal.   folic acid 1 MG tablet Commonly known as: FOLVITE Take 1 mg by mouth daily.   haloperidol 10 MG tablet Commonly known as: HALDOL Take 1.5 tablets (15 mg total) by mouth 2 (two) times daily with a meal.   hydrOXYzine 50 MG tablet Commonly known as: ATARAX/VISTARIL Take 50 mg by mouth 2 (two) times daily as needed for anxiety.   levothyroxine 125 MCG tablet Commonly known as: SYNTHROID Take 1 tablet (125 mcg total) by mouth daily before breakfast.   metFORMIN 1000 MG tablet Commonly known as: GLUCOPHAGE Take 1 tablet (1,000 mg total) by mouth 2 (two) times daily with a meal.   OLANZapine zydis 20 MG disintegrating tablet Commonly known as: ZYPREXA Take 1 tablet (20 mg total) by mouth 2 (two) times daily with a meal.   simvastatin 20 MG tablet Commonly known as: ZOCOR Take 20 mg by mouth at bedtime.   vitamin B-12 1000 MCG tablet Commonly known as: CYANOCOBALAMIN Take 1,000 mcg by mouth daily.       [place], [facility]. Schedule an appointment as soon as possible for a visit in 1 week(s).   Contact information: [place] [address] [phone]           Allergies  Allergen Reactions  . Depakote [Valproic Acid] Other (See Comments)    Reaction:  Hyperammonemia   . Lithium Other (See Comments)    Pt has a prior history of Lithium toxicity.        The results of significant diagnostics from this hospitalization (including imaging, microbiology, ancillary and laboratory) are listed below for reference.   Consultations:   Procedures/Studies: CT Head Wo Contrast  Result Date: 08/18/2019 CLINICAL DATA:  Witnessed seizure. EXAM: CT HEAD WITHOUT CONTRAST TECHNIQUE: Contiguous axial images were obtained from the base of the skull through the vertex without intravenous contrast. COMPARISON:  11/29/2018 FINDINGS: Brain: The brain shows a normal appearance without evidence of malformation,  atrophy, old or acute small or large vessel infarction, mass lesion, hemorrhage, hydrocephalus or extra-axial collection. Vascular: No hyperdense vessel. No evidence of atherosclerotic calcification. Skull: Normal.  No traumatic finding.  No focal bone lesion. Sinuses/Orbits: Sinuses are clear. Orbits appear normal. Mastoids are clear. Other: None significant IMPRESSION: Normal head CT Electronically Signed   By: Nelson Chimes M.D.   On: 08/18/2019 16:34      Labs: BNP (last 3 results) No results for input(s): BNP in the last 8760 hours. Basic Metabolic Panel: Recent Labs  Lab 08/18/19 1534 08/19/19 0508  NA 139 141  K 3.7 3.5  CL 104 108  CO2 18* 25  GLUCOSE 191* 111*  BUN 16 12  CREATININE 1.28* 0.96  CALCIUM 9.0 8.2*   Liver Function Tests: Recent Labs  Lab 08/18/19 1534 08/19/19 0508  AST 25 20  ALT 11 10  ALKPHOS 50 45  BILITOT 1.0 1.1  PROT  7.3 6.2*  ALBUMIN 4.1 3.3*   No results for input(s): LIPASE, AMYLASE in the last 168 hours. No results for input(s): AMMONIA in the last 168 hours. CBC: Recent Labs  Lab 08/18/19 1534 08/19/19 0508  WBC 5.9 5.6  NEUTROABS 3.6  --   HGB 14.1 12.0*  HCT 42.5 35.9*  MCV 79.7* 78.9*  PLT 263 200   Cardiac Enzymes: No results for input(s): CKTOTAL, CKMB, CKMBINDEX, TROPONINI in the last 168 hours. BNP: Invalid input(s): POCBNP CBG: Recent Labs  Lab 08/18/19 1833 08/18/19 2343 08/19/19 0413 08/19/19 0720 08/19/19 1633  GLUCAP 132* 121* 112* 111* 118*   D-Dimer No results for input(s): DDIMER in the last 72 hours. Hgb A1c No results for input(s): HGBA1C in the last 72 hours. Lipid Profile No results for input(s): CHOL, HDL, LDLCALC, TRIG, CHOLHDL, LDLDIRECT in the last 72 hours. Thyroid function studies No results for input(s): TSH, T4TOTAL, T3FREE, THYROIDAB in the last 72 hours.  Invalid input(s): FREET3 Anemia work up No results for input(s): VITAMINB12, FOLATE, FERRITIN, TIBC, IRON, RETICCTPCT in the last  72 hours. Urinalysis    Component Value Date/Time   COLORURINE YELLOW (A) 09/17/2015 1655   APPEARANCEUR CLEAR (A) 09/17/2015 1655   APPEARANCEUR Clear 09/11/2012 0444   LABSPEC 1.027 09/17/2015 1655   LABSPEC 1.002 09/11/2012 0444   PHURINE 5.0 09/17/2015 1655   GLUCOSEU NEGATIVE 09/17/2015 1655   GLUCOSEU Negative 09/11/2012 0444   HGBUR NEGATIVE 09/17/2015 1655   BILIRUBINUR NEGATIVE 09/17/2015 1655   BILIRUBINUR Negative 09/11/2012 0444   KETONESUR NEGATIVE 09/17/2015 1655   PROTEINUR 30 (A) 09/17/2015 1655   NITRITE NEGATIVE 09/17/2015 1655   LEUKOCYTESUR NEGATIVE 09/17/2015 1655   LEUKOCYTESUR Negative 09/11/2012 0444   Sepsis Labs Invalid input(s): PROCALCITONIN,  WBC,  LACTICIDVEN Microbiology Recent Results (from the past 240 hour(s))  SARS CORONAVIRUS 2 (TAT 6-24 HRS) Nasopharyngeal Nasopharyngeal Swab     Status: None   Collection Time: 08/18/19  7:26 PM   Specimen: Nasopharyngeal Swab  Result Value Ref Range Status   SARS Coronavirus 2 NEGATIVE NEGATIVE Final    Comment: (NOTE) SARS-CoV-2 target nucleic acids are NOT DETECTED. The SARS-CoV-2 RNA is generally detectable in upper and lower respiratory specimens during the acute phase of infection. Negative results do not preclude SARS-CoV-2 infection, do not rule out co-infections with other pathogens, and should not be used as the sole basis for treatment or other patient management decisions. Negative results must be combined with clinical observations, patient history, and epidemiological information. The expected result is Negative. Fact Sheet for Patients: HairSlick.no Fact Sheet for Healthcare Providers: quierodirigir.com This test is not yet approved or cleared by the [place] FDA and  has been authorized for detection and/or diagnosis of SARS-CoV-2 by FDA under an Emergency Use Authorization (EUA). This EUA will remain  in effect (meaning this  test can be used) for the duration of the COVID-19 declaration under Section 56 4(b)(1) of the Act, 21 U.S.C. section 360bbb-3(b)(1), unless the authorization is terminated or revoked sooner. Performed at [facility], [address] [place], [place] [address]      Total time spend on discharging this patient, including the last patient exam, discussing the hospital stay, instructions for ongoing care as it relates to all pertinent caregivers, as well as preparing the medical discharge records, prescriptions, and/or referrals as applicable, is 40 minutes.    Darlin Priestly, MD  [facility] 08/25/2019, 2:34 PM  If 7PM-7AM, please contact night-coverage

## 2019-08-25 NOTE — Progress Notes (Signed)
PT Cancellation Note  Patient Details Name: [name] MRN: [id number] DOB: [birth year]   Cancelled Treatment:    Reason Eval/Treat Not Completed: Other (comment)   Pt sitting EOB upon arrival with sitter.  Multiple papers and pen in hand.  Encouraged gait and gait belt donned but he picked papers up and began reading again.  Several attempts made to encourage gait but pt did not respond and continued with task.  Will attempt again later as time allows.  Per chart review, pt is walking with sitters in hallway at times.   Danielle Dess 08/25/2019, 9:01 AM

## 2019-09-08 ENCOUNTER — Other Ambulatory Visit: Payer: Self-pay

## 2019-09-08 ENCOUNTER — Emergency Department
Admission: EM | Admit: 2019-09-08 | Discharge: 2019-09-08 | Disposition: A | Discharge status: Home / Self Care | Payer: Medicare Other | Attending: Emergency Medicine | Admitting: Emergency Medicine

## 2019-09-08 DIAGNOSIS — Z9119 Patient's noncompliance with other medical treatment and regimen: Secondary | ICD-10-CM | POA: Insufficient documentation

## 2019-09-08 DIAGNOSIS — I10 Essential (primary) hypertension: Secondary | ICD-10-CM | POA: Diagnosis not present

## 2019-09-08 DIAGNOSIS — E119 Type 2 diabetes mellitus without complications: Secondary | ICD-10-CM | POA: Insufficient documentation

## 2019-09-08 DIAGNOSIS — R569 Unspecified convulsions: Secondary | ICD-10-CM | POA: Insufficient documentation

## 2019-09-08 DIAGNOSIS — Z7984 Long term (current) use of oral hypoglycemic drugs: Secondary | ICD-10-CM | POA: Diagnosis not present

## 2019-09-08 LAB — CBC WITH DIFFERENTIAL/PLATELET
Abs Immature Granulocytes: 0.04 10*3/uL (ref 0.00–0.07)
Basophils Absolute: 0 10*3/uL (ref 0.0–0.1)
Basophils Relative: 0 %
Eosinophils Absolute: 0 10*3/uL (ref 0.0–0.5)
Eosinophils Relative: 0 %
HCT: 45.1 % (ref 39.0–52.0)
Hemoglobin: 14.8 g/dL (ref 13.0–17.0)
Immature Granulocytes: 1 %
Lymphocytes Relative: 9 %
Lymphs Abs: 0.8 10*3/uL (ref 0.7–4.0)
MCH: 25.8 pg — ABNORMAL LOW (ref 26.0–34.0)
MCHC: 32.8 g/dL (ref 30.0–36.0)
MCV: 78.7 fL — ABNORMAL LOW (ref 80.0–100.0)
Monocytes Absolute: 0.9 10*3/uL (ref 0.1–1.0)
Monocytes Relative: 10 %
Neutro Abs: 6.9 10*3/uL (ref 1.7–7.7)
Neutrophils Relative %: 80 %
Platelets: 334 10*3/uL (ref 150–400)
RBC: 5.73 MIL/uL (ref 4.22–5.81)
RDW: 14.1 % (ref 11.5–15.5)
WBC: 8.6 10*3/uL (ref 4.0–10.5)
nRBC: 0 % (ref 0.0–0.2)

## 2019-09-08 LAB — COMPREHENSIVE METABOLIC PANEL
ALT: 13 U/L (ref 0–44)
AST: 29 U/L (ref 15–41)
Albumin: 4.4 g/dL (ref 3.5–5.0)
Alkaline Phosphatase: 65 U/L (ref 38–126)
Anion gap: 18 — ABNORMAL HIGH (ref 5–15)
BUN: 24 mg/dL — ABNORMAL HIGH (ref 8–23)
CO2: 20 mmol/L — ABNORMAL LOW (ref 22–32)
Calcium: 9.3 mg/dL (ref 8.9–10.3)
Chloride: 106 mmol/L (ref 98–111)
Creatinine, Ser: 1.39 mg/dL — ABNORMAL HIGH (ref 0.61–1.24)
GFR calc Af Amer: >60 mL/min (ref >60)
GFR calc non Af Amer: 53 mL/min — ABNORMAL LOW (ref >60)
Glucose, Bld: 205 mg/dL — ABNORMAL HIGH (ref 70–99)
Potassium: 4.1 mmol/L (ref 3.5–5.1)
Sodium: 144 mmol/L (ref 135–145)
Total Bilirubin: 0.9 mg/dL (ref 0.3–1.2)
Total Protein: 8.1 g/dL (ref 6.5–8.1)

## 2019-09-08 LAB — VALPROIC ACID LEVEL: Valproic Acid Lvl: <10 ug/mL — ABNORMAL LOW (ref 50.0–100.0)

## 2019-09-08 LAB — TROPONIN I (HIGH SENSITIVITY): Troponin I (High Sensitivity): 16 ng/L (ref <18)

## 2019-09-08 LAB — AMMONIA: Ammonia: 30 umol/L (ref 9–35)

## 2019-09-08 LAB — ETHANOL: Alcohol, Ethyl (B): <10 mg/dL (ref <10)

## 2019-09-08 MED ORDER — HALOPERIDOL LACTATE 5 MG/ML IJ SOLN
INTRAMUSCULAR | Status: AC
  Administered 2019-09-08: 5 mg via INTRAMUSCULAR
  Filled 2019-09-08: qty 1

## 2019-09-08 MED ORDER — SODIUM CHLORIDE 0.9 % IV BOLUS
1000.0000 mL | Freq: Once | INTRAVENOUS | Status: AC
  Administered 2019-09-08: 1000 mL via INTRAVENOUS

## 2019-09-08 MED ORDER — LORAZEPAM 2 MG/ML IJ SOLN
1.0000 mg | Freq: Once | INTRAMUSCULAR | Status: AC
  Administered 2019-09-08: 1 mg via INTRAVENOUS
  Filled 2019-09-08: qty 1

## 2019-09-08 MED ORDER — HALOPERIDOL LACTATE 5 MG/ML IJ SOLN: 5.0000 mg | Freq: Once | INTRAMUSCULAR | Status: AC

## 2019-09-08 MED ORDER — MECLIZINE HCL 25 MG PO TABS: 50.0000 mg | ORAL_TABLET | Freq: Once | ORAL | Status: DC

## 2019-09-08 MED ORDER — VALPROATE SODIUM 500 MG/5ML IV SOLN
500.0000 mg | Freq: Once | INTRAVENOUS | Status: AC
  Administered 2019-09-08: 500 mg via INTRAVENOUS
  Filled 2019-09-08: qty 5

## 2019-09-08 MED ORDER — HYDROCODONE-ACETAMINOPHEN 5-325 MG PO TABS: 2.0000 | ORAL_TABLET | Freq: Once | ORAL | Status: DC

## 2019-09-08 MED ORDER — LORAZEPAM 2 MG/ML IJ SOLN
2.0000 mg | Freq: Once | INTRAMUSCULAR | Status: AC
  Administered 2019-09-08: 2 mg via INTRAMUSCULAR
  Filled 2019-09-08: qty 1

## 2019-09-08 NOTE — ED Notes (Signed)
EMS called for transport back to [facility] at [address] [facility] [place]

## 2019-09-08 NOTE — ED Notes (Signed)
Attempted to get pt to wheelchair to go to taxi and pt refused to get out of bed. Amy, charge RN informed and she attempted to assist me with transferring to wheelchair as pt continue to refuse. Tech [place], [place], [place], [place], and this RH attempted to get pt into chair but he refused. Pt then refused to get back in bed and began swinging at staff. Dr. Derrill Kay informed, see new orders. During this incident this RN attempted to contact [employer] taxi service multiple times to provide an update without success. Charge RN informed of full incident. Pt eventually assisted back to bed and is cooperative at this time. [place] Sp[place]iel tech at beside at this time.

## 2019-09-08 NOTE — ED Notes (Signed)
Called [place] taxi service to get a cab for pt to go back to [facility]. Secretary states they will call me when someone arrives to get him

## 2019-09-08 NOTE — ED Notes (Signed)
Verbal given by Dr. Mayford Knife for 1,073ml normal saline bolus stat

## 2019-09-08 NOTE — ED Provider Notes (Signed)
[facility] Emergency Department Provider Note       Time seen: ----------------------------------------- 12:32 PM on 09/08/2019 -----------------------------------------  Level V caveat: History/ROS limited by altered mental status I have reviewed the triage vital signs and the nursing notes.  HISTORY   Chief Complaint Seizures    HPI [name] is a 64 y.o. male with a history of diabetes, hypertension, hyperammonemia, schizophrenia, sickle cell trait, TIA, thyroid disease who presents to the ED for seizure-like activity.  Patient arrives from a family care home after tonic-clonic seizure-like activity that lasted around 3 minutes.  He does have a history of seizures.  Patient appeared to be at his baseline according to the facility with a history of schizophrenia.  Past Medical History:  Diagnosis Date  . Diabetes mellitus without complication (HCC)   . HTN (hypertension)   . Hyperammonemia (HCC) While on Depakote  . Hyperlipemia   . Lithium toxicity   . Neutropenia (HCC)   . Schizophrenia (HCC)   . Seizure (HCC) While toxic on Depakote  . Sickle cell trait (HCC)   . Thyroid disease   . TIA (transient ischemic attack) in 2009    Patient Active Problem List   Diagnosis Date Noted  . AKI (acute kidney injury) (HCC)   . Rash   . Agitation   . Tachycardia   . Recurrent seizures (HCC) 08/19/2019  . Seizure (HCC) 08/18/2019  . Tardive dyskinesia 11/21/2015  . Noncompliance 11/21/2015  . HTN (hypertension) 10/10/2015  . Hyperlipidemia 09/25/2015  . Schizophrenia (HCC)   . Diabetes (HCC) 09/16/2015  . Hypothyroid 09/16/2015    History reviewed. No pertinent surgical history.  Allergies Depakote [valproic acid] and Lithium  Social History Social History   Tobacco Use  . Smoking status: Unknown If Ever Smoked  Substance Use Topics  . Alcohol use: No  . Drug use: Not on file    Review of Systems Constitutional: Negative for  fever. Cardiovascular: Negative for chest pain. Respiratory: Negative for shortness of breath. Gastrointestinal: Negative for abdominal pain, vomiting and diarrhea. Musculoskeletal: Negative for back pain. Skin: Negative for rash. Neurological: Negative for headaches, focal weakness or numbness.  Positive for seizure  All systems negative/normal/unremarkable except as stated in the HPI  ____________________________________________   PHYSICAL EXAM:  VITAL SIGNS: ED Triage Vitals [09/08/19 1231]  Enc Vitals Group     BP      Pulse      Resp      Temp      Temp src      SpO2      Weight 200 lb (90.7 kg)     Height 6' (1.829 m)     Head Circumference      Peak Flow      Pain Score 0     Pain Loc      Pain Edu?      Excl. in GC?     Constitutional: Alert but disoriented, no obvious distress Eyes: Conjunctivae are normal. Normal extraocular movements. ENT      Head: Normocephalic and atraumatic.      Nose: No congestion/rhinnorhea.      Mouth/Throat: Mucous membranes are moist.      Neck: No stridor. Cardiovascular: Normal rate, regular rhythm. No murmurs, rubs, or gallops. Respiratory: Normal respiratory effort without tachypnea nor retractions. Breath sounds are clear and equal bilaterally. No wheezes/rales/rhonchi. Gastrointestinal: Soft and nontender. Normal bowel sounds Musculoskeletal: Nontender with normal range of motion in extremities. No lower extremity tenderness nor  edema. Neurologic:  No gross focal neurologic deficits are appreciated.  Skin:  Skin is warm, dry and intact. No rash noted. Psychiatric: Bizarre mood and affect which appears to be his baseline ____________________________________________  EKG: Interpreted by me.  Sinus tachycardia rate of 134 bpm, low voltage, normal axis, normal QT  ____________________________________________  ED COURSE:  As part of my medical decision making, I reviewed the following data within the electronic medical  record:  History obtained from family if available, nursing notes, old chart and ekg, as well as notes from prior ED visits. Patient presented for seizure-like activity, we will assess with labs and imaging as indicated at this time.   Procedures  Mc Bloodworth was evaluated in Emergency Department on 09/08/2019 for the symptoms described in the history of present illness. He was evaluated in the context of the global COVID-19 pandemic, which necessitated consideration that the patient might be at risk for infection with the SARS-CoV-2 virus that causes COVID-19. Institutional protocols and algorithms that pertain to the evaluation of patients at risk for COVID-19 are in a state of rapid change based on information released by regulatory bodies including the CDC and federal and state organizations. These policies and algorithms were followed during the patient's care in the ED.  ____________________________________________   LABS (pertinent positives/negatives)  Labs Reviewed  CBC WITH DIFFERENTIAL/PLATELET - Abnormal; Notable for the following components:      Result Value   MCV 78.7 (*)    MCH 25.8 (*)    All other components within normal limits  COMPREHENSIVE METABOLIC PANEL - Abnormal; Notable for the following components:   CO2 20 (*)    Glucose, Bld 205 (*)    BUN 24 (*)    Creatinine, Ser 1.39 (*)    GFR calc non Af Amer 53 (*)    Anion gap 18 (*)    All other components within normal limits  VALPROIC ACID LEVEL - Abnormal; Notable for the following components:   Valproic Acid Lvl <10 (*)    All other components within normal limits  ETHANOL  AMMONIA  URINALYSIS, COMPLETE (UACMP) WITH MICROSCOPIC  URINE DRUG SCREEN, QUALITATIVE ([facility] ONLY)  CBG MONITORING, ED  TROPONIN I (HIGH SENSITIVITY)   ____________________________________________   DIFFERENTIAL DIAGNOSIS   Seizure, medication noncompliance, dehydration, electrolyte abnormality   FINAL ASSESSMENT AND  PLAN  Seizure   Plan: The patient had presented for seizure-like activity. Patient's labs did reveal that his Depakote level is less than 10 likely indicating noncompliance.  I talked to his legal guardian who states he is chronically noncompliant.  We loaded him with IV valproic acid here.  We will encourage strict medication compliance.  Otherwise he is cleared for outpatient follow-up   Laurence Aly, MD    Note: This note was generated in part or whole with voice recognition software. Voice recognition is usually quite accurate but there are transcription errors that can and very often do occur. I apologize for any typographical errors that were not detected and corrected.     Earleen Newport, MD 09/08/19 1451

## 2019-09-08 NOTE — ED Notes (Signed)
Pt's legal guardian Carly informed pt is in the ER

## 2019-09-08 NOTE — ED Notes (Signed)
Pt able to stand and assisted over to EMS stretcher for discharge.  Patient unable to sign discharge paperwork at this time d/t refusal and and legal guardian status.  Pt chest rise even and unlabored, in NAD, and cooperative at time of discharge.  Pt denies pain.

## 2019-09-08 NOTE — ED Triage Notes (Signed)
Pt arrives via [facility] from [facility] for reports of tonic clonic seizure activity that lasted around 3 minutes. Pt has hx of seizures, pt refused blood sugar check in route. Pt speaking at times but sentences do not make sense, per EMS this is baseline per the facility. Pt alert but not oriented.

## 2019-09-08 NOTE — ED Notes (Signed)
Left message for pt's legal guradian [name] to inform her of pt's discharge

## 2019-09-08 NOTE — ED Notes (Signed)
Report given to Stephen RN.

## 2019-09-08 NOTE — Discharge Instructions (Addendum)
Please seek medical attention for any high fevers, chest pain, shortness of breath, change in behavior, persistent vomiting, bloody stool or any other new or concerning symptoms.  

## 2019-09-08 NOTE — ED Notes (Addendum)
Spoke with Jimmye Norman who is the owner of [facility] where patient lives. Report given to [facility] and she states that he can go back to her facility by [employer] taxi service. [name] reports she has an account with [employer] and they bill her for cab rides. Charge RN Amy informed.

## 2020-07-30 ENCOUNTER — Emergency Department: Payer: Medicare Other

## 2020-07-30 ENCOUNTER — Other Ambulatory Visit: Payer: Self-pay

## 2020-07-30 ENCOUNTER — Encounter: Payer: Self-pay | Admitting: Emergency Medicine

## 2020-07-30 ENCOUNTER — Emergency Department
Admission: EM | Admit: 2020-07-30 | Discharge: 2020-07-31 | Disposition: A | Discharge status: Home / Self Care | Payer: Medicare Other | Attending: Emergency Medicine | Admitting: Emergency Medicine

## 2020-07-30 DIAGNOSIS — I10 Essential (primary) hypertension: Secondary | ICD-10-CM | POA: Insufficient documentation

## 2020-07-30 DIAGNOSIS — Z7982 Long term (current) use of aspirin: Secondary | ICD-10-CM | POA: Diagnosis not present

## 2020-07-30 DIAGNOSIS — E039 Hypothyroidism, unspecified: Secondary | ICD-10-CM | POA: Insufficient documentation

## 2020-07-30 DIAGNOSIS — E119 Type 2 diabetes mellitus without complications: Secondary | ICD-10-CM | POA: Diagnosis not present

## 2020-07-30 DIAGNOSIS — Z79899 Other long term (current) drug therapy: Secondary | ICD-10-CM | POA: Insufficient documentation

## 2020-07-30 DIAGNOSIS — R569 Unspecified convulsions: Secondary | ICD-10-CM | POA: Diagnosis not present

## 2020-07-30 LAB — BASIC METABOLIC PANEL WITH GFR
Anion gap: 21 — ABNORMAL HIGH (ref 5–15)
BUN: 20 mg/dL (ref 8–23)
CO2: 17 mmol/L — ABNORMAL LOW (ref 22–32)
Calcium: 9 mg/dL (ref 8.9–10.3)
Chloride: 103 mmol/L (ref 98–111)
Creatinine, Ser: 1.47 mg/dL — ABNORMAL HIGH (ref 0.61–1.24)
GFR, Estimated: 53 mL/min — ABNORMAL LOW
Glucose, Bld: 197 mg/dL — ABNORMAL HIGH (ref 70–99)
Potassium: 3.6 mmol/L (ref 3.5–5.1)
Sodium: 141 mmol/L (ref 135–145)

## 2020-07-30 LAB — BASIC METABOLIC PANEL
Anion gap: 14 (ref 5–15)
BUN: 17 mg/dL (ref 8–23)
CO2: 18 mmol/L — ABNORMAL LOW (ref 22–32)
Calcium: 7.4 mg/dL — ABNORMAL LOW (ref 8.9–10.3)
Chloride: 110 mmol/L (ref 98–111)
Creatinine, Ser: 1 mg/dL (ref 0.61–1.24)
GFR, Estimated: >60 mL/min (ref >60)
Glucose, Bld: 113 mg/dL — ABNORMAL HIGH (ref 70–99)
Potassium: 3.5 mmol/L (ref 3.5–5.1)
Sodium: 142 mmol/L (ref 135–145)

## 2020-07-30 LAB — CBC
HCT: 41.5 % (ref 39.0–52.0)
Hemoglobin: 13.6 g/dL (ref 13.0–17.0)
MCH: 26.2 pg (ref 26.0–34.0)
MCHC: 32.8 g/dL (ref 30.0–36.0)
MCV: 80 fL (ref 80.0–100.0)
Platelets: 339 10*3/uL (ref 150–400)
RBC: 5.19 MIL/uL (ref 4.22–5.81)
RDW: 13.2 % (ref 11.5–15.5)
WBC: 5.8 10*3/uL (ref 4.0–10.5)
nRBC: 0 % (ref 0.0–0.2)

## 2020-07-30 LAB — VALPROIC ACID LEVEL: Valproic Acid Lvl: <10 ug/mL — ABNORMAL LOW (ref 50.0–100.0)

## 2020-07-30 MED ORDER — SODIUM CHLORIDE 0.9 % IV BOLUS (SEPSIS)
1000.0000 mL | Freq: Once | INTRAVENOUS | Status: AC
  Administered 2020-07-30: 1000 mL via INTRAVENOUS

## 2020-07-30 MED ORDER — SODIUM CHLORIDE 0.9 % IV BOLUS
1000.0000 mL | Freq: Once | INTRAVENOUS | Status: AC
  Administered 2020-07-30: 1000 mL via INTRAVENOUS

## 2020-07-30 MED ORDER — VALPROATE SODIUM 100 MG/ML IV SOLN
500.0000 mg | Freq: Once | INTRAVENOUS | Status: AC
  Administered 2020-07-30: 500 mg via INTRAVENOUS
  Filled 2020-07-30: qty 5

## 2020-07-30 NOTE — ED Triage Notes (Signed)
Pt presents via [facility] with c/o witnessed seizure. Staff at group home reports seizure lasted about 2 minutes. Staff reports that patient hit head with seizure. Pt post-ictal and not responding to questions at this time. 1mg  versed given by ems. Pt not compliant with medications.

## 2020-07-30 NOTE — ED Provider Notes (Signed)
Attending physician note:  65 year old male with a known seizure disorder and schizophrenia presents with an apparent breakthrough seizure.  Per EMS, he is chronically noncompliant with his Depakote.  The patient presents with altered mental status thought to be due to postictal state.  He is tachycardic and hypertensive, but other vital signs are normal.  Neurologic exam is nonfocal although the patient is not really able to cooperate much with exam.    I reviewed the past medical records in Epic.  The patient was most recently seen with a similar presentation on 09/08/2019.  He was loaded with IV Depakote and was able to be discharged.  Previously he was seen the month before and had back-to-back seizures in the ED requiring admission.  The patient has schizophrenia, has a history of medication noncompliance, and does not have decision-making capacity  Plan will be lab work-up including Depakote level, loading with IV Depakote, and observation until he returns to his baseline.  If he has recurrent seizures or concerning findings on work-up, he may need readmission.  ED ECG REPORT I, Dionne Bucy, the attending physician, personally viewed and interpreted this ECG.  Date: 07/30/2020 EKG Time: 1820 Rate: 126 Rhythm: Sinus tachycardia QRS Axis: Left axis Intervals: normal ST/T Wave abnormalities: normal Narrative Interpretation: no evidence of acute ischemia   ----------------------------------------- 11:05 PM on 07/30/2020 -----------------------------------------  The patient is alert but currently nonverbal.  It is unclear if this is his baseline.  From the prior notes, he apparently is not very conversant normally.  There is no further seizure activity and he is making normal gestures and looking around the room purposefully.  Initial lab work-up reveals an elevated anion gap.  I suspect that this is due to lactic acidosis from seizure activity.  The patient has received fluids and  his tachycardia has improved.  We will repeat the BMP.  The RN is working on contacting the group home and/or guardian to determine his baseline mental status.  I anticipate if the anion gap clears and the patient is confirmed to be at his baseline with no further seizure activity, he could be discharged back to his group home.  I have signed the patient out to the oncoming physician Dr. Elesa Massed.    Dionne Bucy, MD 07/30/20 2306

## 2020-07-30 NOTE — ED Notes (Signed)
Pt ambulated independently to restroom

## 2020-07-30 NOTE — ED Notes (Signed)
Pt placed on bed alarm, falls band placed on patient. Bed in lowest and locked position.

## 2020-07-30 NOTE — ED Provider Notes (Signed)
  Physical Exam  BP (!) 152/95   Pulse (!) 113   Resp (!) 21   Ht 6' (1.829 m)   Wt 101.6 kg   SpO2 98%   BMI 30.38 kg/m   Physical Exam  Gen: No acute distress  Resp: Normal rise and fall of chest Neuro: Moving all four extremities Psych: Resting currently, calm and cooperative when awake    ED Course/Procedures     Procedures  MDM  Assumed care.  Patient with history of schizophrenia and seizures with medical noncompliance comes in after a seizure.  Postictal here but improving.  Depakote level undetectable.  Has been loaded with IV Depakote.  Patient continues to be tachycardic.  Repeat oral temp 98.9.  Rectal temp 99.5.  Has received 1 L of fluid.  Will give second liter in the ED.  He had a metabolic acidosis likely from the lactic acidosis from seizure today but is slightly hyperglycemic.  Has received fluids.  Plan is to recheck BMP.  Nurse was able to get in touch with patient's group home.  They report he is nonverbal at baseline.  He is at his neurologic baseline here in the emergency department.  12:15 AM  Pt's repeat BMP shows that his gap is now closed and his bicarb has improved slightly.   3:00 AM  Pt's heart rate has improved and he is resting comfortably.  Moving all extremities equally.  On review of his records, it appears that it is normal for him to be tachycardic for prolonged period of time after his seizure.  I feel he is safe to be discharged back to his group home.  We will refill his Depakote.   At this time, I do not feel there is any life-threatening condition present. I have reviewed, interpreted and discussed all results (EKG, imaging, lab, urine as appropriate) and exam findings with patient/family. I have reviewed nursing notes and appropriate previous records.  I feel the patient is safe to be discharged home without further emergent workup and can continue workup as an outpatient as needed. Discussed usual and customary return precautions.  Patient/family verbalize understanding and are comfortable with this plan.  Outpatient follow-up has been provided as needed. All questions have been answered.    EKG Interpretation  Date/Time:  Sunday July 30 2020 23:18:03 EST Ventricular Rate:  127 PR Interval:  172 QRS Duration: 86 QT Interval:  334 QTC Calculation: 485 R Axis:   -16 Text Interpretation: Sinus tachycardia with occasional Premature ventricular complexes Moderate voltage criteria for LVH, may be normal variant ( R in aVL , Sokolow-Lyon ) Borderline ECG No significant change since last tracing Confirmed by Rochele Raring [phone]) on 07/30/2020 11:29:36 PM         Aydyn Testerman, Layla Maw, DO 07/31/20 0310

## 2020-07-30 NOTE — ED Provider Notes (Signed)
[facility] Emergency Department Provider Note  ____________________________________________  Time seen: Approximately 6:20 PM  I have reviewed the triage vital signs and the nursing notes.   HISTORY  Chief Complaint Seizures  Level 5 Caveat: Patient noncontributory towards his history majority of information comes from group home staff, EMS and previous medical records  HPI [name] is a 65 y.o. male who presents to the emergency department from the group home for a witnessed described tonic-clonic seizure that lasted approximately 2 minutes.  Patient was given 1 mg of Versed by EMS.  No recent complaints in regards to fevers or chills, complaints of pain, emesis or diarrhea are reported.  At this time patient does not answer questions and will not even respond to yes or no questions.  This appears to be patient's relative baseline given his previous visits after seizures.  Patient is largely noncompliant with his medications and typically has had a low valproic acid levels which is consistent with his noncompliance.  He does have a history of diabetes, hypertension, hyperammonemia, schizophrenia, seizure disorder.  Vital signs were reassuring with EMS.        Past Medical History:  Diagnosis Date  . Diabetes mellitus without complication (HCC)   . HTN (hypertension)   . Hyperammonemia (HCC) While on Depakote  . Hyperlipemia   . Lithium toxicity   . Neutropenia (HCC)   . Schizophrenia (HCC)   . Seizure (HCC) While toxic on Depakote  . Sickle cell trait (HCC)   . Thyroid disease   . TIA (transient ischemic attack) in 2009    Patient Active Problem List   Diagnosis Date Noted  . AKI (acute kidney injury) (HCC)   . Rash   . Agitation   . Tachycardia   . Recurrent seizures (HCC) 08/19/2019  . Seizure (HCC) 08/18/2019  . Tardive dyskinesia 11/21/2015  . Noncompliance 11/21/2015  . HTN (hypertension) 10/10/2015  . Hyperlipidemia 09/25/2015  .  Schizophrenia (HCC)   . Diabetes (HCC) 09/16/2015  . Hypothyroid 09/16/2015    History reviewed. No pertinent surgical history.  Prior to Admission medications   Medication Sig Start Date End Date Taking? Authorizing Provider  aspirin EC 81 MG tablet Take 81 mg by mouth daily.    [provider]  benztropine (COGENTIN) 1 MG tablet Take 1 tablet (1 mg total) by mouth 2 (two) times daily with a meal. 12/10/18   Mariel Craft, MD  clonazePAM (KLONOPIN) 0.5 MG tablet Take 1 tablet (0.5 mg total) by mouth 2 (two) times daily with a meal. 10/04/15   Jimmy Footman, MD  divalproex (DEPAKOTE SPRINKLE) 125 MG capsule Take 8 capsules (1,000 mg total) by mouth 2 (two) times daily with a meal. 12/10/18   Mariel Craft, MD  folic acid (FOLVITE) 1 MG tablet Take 1 mg by mouth daily.    [provider]  haloperidol (HALDOL) 10 MG tablet Take 1.5 tablets (15 mg total) by mouth 2 (two) times daily with a meal. 12/10/18   Mariel Craft, MD  hydrOXYzine (ATARAX/VISTARIL) 50 MG tablet Take 50 mg by mouth 2 (two) times daily as needed for anxiety.    [provider]  levothyroxine (SYNTHROID, LEVOTHROID) 125 MCG tablet Take 1 tablet (125 mcg total) by mouth daily before breakfast. 10/04/15   Jimmy Footman, MD  lisinopril (ZESTRIL) 2.5 MG tablet Take 2.5 mg by mouth daily.    [provider]  metFORMIN (GLUCOPHAGE) 1000 MG tablet Take 1 tablet (1,000 mg total)  by mouth 2 (two) times daily with a meal. 10/04/15   Jimmy Footman, MD  OLANZapine zydis (ZYPREXA) 20 MG disintegrating tablet Take 1 tablet (20 mg total) by mouth 2 (two) times daily with a meal. 12/10/18   Mariel Craft, MD  simvastatin (ZOCOR) 20 MG tablet Take 20 mg by mouth at bedtime.    [provider]  vitamin B-12 (CYANOCOBALAMIN) 1000 MCG tablet Take 1,000 mcg by mouth daily.    [provider]    Allergies Depakote [valproic acid] and Lithium  History  reviewed. No pertinent family history.  Social History Social History   Tobacco Use  . Smoking status: Unknown If Ever Smoked  Substance Use Topics  . Alcohol use: No     Review of Systems limited as patient will not respond to questioning Constitutional: No reported fever/chills ENT: No reported upper respiratory complaints. Respiratory: No reported SOB. Gastrointestinal:  No nausea, no vomiting.  No diarrhea.  No constipation. Musculoskeletal: No reported musculoskeletal injury or complaint Skin: Negative for rash, abrasions, lacerations, ecchymosis. Neurological: Positive for seizure described as tonic-clonic.  10 System ROS otherwise negative.  ____________________________________________   PHYSICAL EXAM:  VITAL SIGNS: ED Triage Vitals [07/30/20 1809]  Enc Vitals Group     BP      Pulse      Resp      Temp      Temp src      SpO2      Weight 224 lb (101.6 kg)     Height 6' (1.829 m)     Head Circumference      Peak Flow      Pain Score      Pain Loc      Pain Edu?      Excl. in GC?      Constitutional: Alert..  Generally well appearing and in no acute distress. Eyes: Conjunctivae are normal. PERRL. EOMI. Head: Atraumatic. ENT:      Ears:       Nose: No congestion/rhinnorhea.      Mouth/Throat: Mucous membranes are moist.  Neck: No stridor.  Patient with full range of motion of the neck spontaneously.  Patient does not appear to be tender to palpation Cardiovascular: Normal rate, regular rhythm. Normal S1 and S2.  Good peripheral circulation. Respiratory: Normal respiratory effort without tachypnea or retractions. Lungs CTAB. Good air entry to the bases with no decreased or absent breath sounds. Gastrointestinal: Bowel sounds 4 quadrants. Soft and nontender to palpation. No guarding or rigidity. No palpable masses. No distention.  Musculoskeletal: Full range of motion to all extremities. No gross deformities appreciated. Neurologic: Patient  noncommunicative with me at this time, appears to be typical presentation for the patient.  No gross focal neurologic deficits are appreciated.  Patient is unable to follow cranial nerve testing. Skin:  Skin is warm, dry and intact. No rash noted. Psychiatric: Flat affect.  Patient is noncommunicative with me at this time.   ____________________________________________   LABS (all labs ordered are listed, but only abnormal results are displayed)  Labs Reviewed  VALPROIC ACID LEVEL - Abnormal; Notable for the following components:      Result Value   Valproic Acid Lvl <10 (*)    All other components within normal limits  BASIC METABOLIC PANEL - Abnormal; Notable for the following components:   CO2 17 (*)    Glucose, Bld 197 (*)    Creatinine, Ser 1.47 (*)    GFR, Estimated 53 (*)  Anion gap 21 (*)    All other components within normal limits  CBC  URINALYSIS, COMPLETE (UACMP) WITH MICROSCOPIC  CBG MONITORING, ED   ____________________________________________  EKG   ____________________________________________  RADIOLOGY I personally viewed and evaluated these images as part of my medical decision making, as well as reviewing the written report by the radiologist.  ED Provider Interpretation: No acute cardiopulmonary process Chest x-ray  DG Chest 1 View  Result Date: 07/30/2020 CLINICAL DATA:  Seizure. EXAM: CHEST  1 VIEW COMPARISON:  11/29/2018 FINDINGS: Mild left hemidiaphragm elevation. Numerous leads and wires project over the chest. Midline trachea. Normal heart size. Tortuous thoracic aorta. No pleural effusion or pneumothorax. Clear lungs. IMPRESSION: No acute cardiopulmonary disease. Electronically Signed   By: Jeronimo Greaves M.D.   On: 07/30/2020 18:46    ____________________________________________    PROCEDURES  Procedure(s) performed:    Procedures    Medications  valproate (DEPACON) 500 mg in dextrose 5 % 50 mL IVPB (500 mg Intravenous New  Bag/Given 07/30/20 1955)  sodium chloride 0.9 % bolus 1,000 mL (1,000 mLs Intravenous New Bag/Given 07/30/20 1952)     ____________________________________________   INITIAL IMPRESSION / ASSESSMENT AND PLAN / ED COURSE  Pertinent labs & imaging results that were available during my care of the patient were reviewed by me and considered in my medical decision making (see chart for details).  Review of the  [place]RS was performed in accordance of the [facility] prior to dispensing any controlled drugs.           Patient presented to the emergency department after a witnessed seizure at the patient's group home.  Patient has a history of seizures and is medically noncompliant with his medications.  Patient has been seen multiple times in the past for a similar complaint.  Patient is largely nonverbal when he is in the emergency department.  Patient did not provide any of his history.  Patient did have an elevated anion gap which is likely secondary to slight acidosis from seizure.  Otherwise labs are reassuring.  Chest x-ray with no acute findings.  Patient's Depakote level less than 10 which further confirms noncompliance with his medications.  Patient is given Depakote here in the emergency department.  Awaiting response of medicine to include fluids and Depakote, patient care will be turned over to attending provider, Dr. Marisa Severin for final diagnosis and disposition.    This chart was dictated using voice recognition software/Dragon. Despite best efforts to proofread, errors can occur which can change the meaning. Any change was purely unintentional.    Racheal Patches, PA-C 07/30/20 2020    Dionne Bucy, MD 07/30/20 2243

## 2020-07-30 NOTE — ED Notes (Signed)
This RN spoke with on-call DSS worker, she advised that she will contact her supervisor and attempt to get contact with pt's legal guardian.

## 2020-07-30 NOTE — ED Notes (Signed)
This RN attempted to call group home x2, no answer.

## 2020-07-30 NOTE — ED Notes (Signed)
Pt provided with urinal for urine sample, pt educated on need for sample.

## 2020-07-30 NOTE — ED Notes (Signed)
This RN spoke with group home staff, advising that patient is non-verbal at baseline.

## 2020-07-30 NOTE — ED Notes (Signed)
This RN spoke with [name] at [employer], advising that pt's legal guardian has changed and they will attempt to make contact with pt's new legal guardian. Pt's chart updated to reflect changes.

## 2020-07-30 NOTE — ED Notes (Signed)
This RN attempted to contact legal guardian, [name], on cell number listed without success. Off-going nurse advised this RN that they had attempted to contact legal guardian as well. This RN contacted [facility] office to speak with on call DSS worker.

## 2020-07-31 DIAGNOSIS — R569 Unspecified convulsions: Secondary | ICD-10-CM | POA: Diagnosis not present

## 2020-07-31 MED ORDER — LORAZEPAM 2 MG/ML IJ SOLN
1.0000 mg | Freq: Once | INTRAMUSCULAR | Status: AC
  Administered 2020-07-31: 1 mg via INTRAVENOUS

## 2020-07-31 MED ORDER — LORAZEPAM 2 MG/ML IJ SOLN
INTRAMUSCULAR | Status: AC
  Filled 2020-07-31: qty 1

## 2020-07-31 MED ORDER — DIVALPROEX SODIUM 125 MG PO CSDR: 1000.0000 mg | DELAYED_RELEASE_CAPSULE | Freq: Two times a day (BID) | ORAL | 2 refills | Status: DC

## 2020-07-31 NOTE — ED Notes (Addendum)
This RN spoke with [facility] staff, made aware of pt discharge, stating they do not have transportation at this time. States they normally use "[facility]" but they are not open.

## 2020-07-31 NOTE — ED Notes (Signed)
This RN attempted to contact pt's legal guardian, Bruce Hanks, at the number that was given by DSS on call with no answer.

## 2020-07-31 NOTE — ED Notes (Addendum)
This RN attempted to call [employer], no answer.

## 2020-07-31 NOTE — Discharge Instructions (Addendum)
Please encourage patient to take his Depakote as prescribed.  A refill of this medication has been sent to his pharmacy.  This will prevent him from having another seizure.  His Depakote level today was undetectable indicating that he has not been taking this medication appropriately.

## 2020-07-31 NOTE — ED Notes (Signed)
This RN spoke with staff at [facility], states she will call back after speaking to staff at group home.

## 2020-07-31 NOTE — ED Notes (Signed)
Pt wandering halls, steady gait, NAD. Pt not answering questions at baseline.This RN attempted multiple times to redirect pt back to treatment room.  Dr Roxan Hockey aware.  Pt with 1:1 sitter in treatment room waiting on ride.  This RN attempted to contact Cheyenne Adas to see estimated time for arrival, no answer at this time, VM left.  Awaiting cab

## 2020-07-31 NOTE — ED Notes (Signed)
This RN and Neurosurgeon attempted to straight cath patient. Unable to complete cath, pt grabbing at catheter whenever attempted. MD Ward made aware.

## 2020-07-31 NOTE — ED Notes (Addendum)
[facility] notified by this RN of pt on the way via [facility]. Pt ambulatory, steady gait. Not compliant with VS

## 2021-04-08 ENCOUNTER — Other Ambulatory Visit: Payer: Self-pay

## 2021-04-08 ENCOUNTER — Emergency Department (EMERGENCY_DEPARTMENT_HOSPITAL)
Admission: EM | Admit: 2021-04-08 | Discharge: 2021-04-09 | Disposition: A | Discharge status: Other Acute Inpatient Hospital | Payer: Medicare Other | Source: Home / Self Care | Attending: Emergency Medicine | Admitting: Emergency Medicine

## 2021-04-08 DIAGNOSIS — Z7984 Long term (current) use of oral hypoglycemic drugs: Secondary | ICD-10-CM | POA: Insufficient documentation

## 2021-04-08 DIAGNOSIS — Y900 Blood alcohol level of less than 20 mg/100 ml: Secondary | ICD-10-CM | POA: Insufficient documentation

## 2021-04-08 DIAGNOSIS — E119 Type 2 diabetes mellitus without complications: Secondary | ICD-10-CM

## 2021-04-08 DIAGNOSIS — F209 Schizophrenia, unspecified: Secondary | ICD-10-CM | POA: Insufficient documentation

## 2021-04-08 DIAGNOSIS — Z79899 Other long term (current) drug therapy: Secondary | ICD-10-CM | POA: Insufficient documentation

## 2021-04-08 DIAGNOSIS — Z20822 Contact with and (suspected) exposure to covid-19: Secondary | ICD-10-CM | POA: Insufficient documentation

## 2021-04-08 DIAGNOSIS — F203 Undifferentiated schizophrenia: Secondary | ICD-10-CM | POA: Diagnosis present

## 2021-04-08 DIAGNOSIS — E039 Hypothyroidism, unspecified: Secondary | ICD-10-CM | POA: Diagnosis present

## 2021-04-08 DIAGNOSIS — Z91199 Patient's noncompliance with other medical treatment and regimen due to unspecified reason: Secondary | ICD-10-CM

## 2021-04-08 DIAGNOSIS — R4689 Other symptoms and signs involving appearance and behavior: Secondary | ICD-10-CM

## 2021-04-08 DIAGNOSIS — R7303 Prediabetes: Secondary | ICD-10-CM

## 2021-04-08 DIAGNOSIS — I10 Essential (primary) hypertension: Secondary | ICD-10-CM | POA: Insufficient documentation

## 2021-04-08 DIAGNOSIS — E785 Hyperlipidemia, unspecified: Secondary | ICD-10-CM | POA: Diagnosis present

## 2021-04-08 DIAGNOSIS — Z7982 Long term (current) use of aspirin: Secondary | ICD-10-CM | POA: Insufficient documentation

## 2021-04-08 DIAGNOSIS — G2401 Drug induced subacute dyskinesia: Secondary | ICD-10-CM | POA: Diagnosis present

## 2021-04-08 LAB — CBC
HCT: 42.2 % (ref 39.0–52.0)
Hemoglobin: 14.1 g/dL (ref 13.0–17.0)
MCH: 26.7 pg (ref 26.0–34.0)
MCHC: 33.4 g/dL (ref 30.0–36.0)
MCV: 79.8 fL — ABNORMAL LOW (ref 80.0–100.0)
Platelets: 219 10*3/uL (ref 150–400)
RBC: 5.29 MIL/uL (ref 4.22–5.81)
RDW: 14.6 % (ref 11.5–15.5)
WBC: 3.2 10*3/uL — ABNORMAL LOW (ref 4.0–10.5)
nRBC: 0 % (ref 0.0–0.2)

## 2021-04-08 LAB — COMPREHENSIVE METABOLIC PANEL
ALT: 8 U/L (ref 0–44)
AST: 22 U/L (ref 15–41)
Albumin: 4.1 g/dL (ref 3.5–5.0)
Alkaline Phosphatase: 51 U/L (ref 38–126)
Anion gap: 8 (ref 5–15)
BUN: 17 mg/dL (ref 8–23)
CO2: 28 mmol/L (ref 22–32)
Calcium: 8.8 mg/dL — ABNORMAL LOW (ref 8.9–10.3)
Chloride: 102 mmol/L (ref 98–111)
Creatinine, Ser: 1.14 mg/dL (ref 0.61–1.24)
GFR, Estimated: >60 mL/min (ref >60)
Glucose, Bld: 126 mg/dL — ABNORMAL HIGH (ref 70–99)
Potassium: 3.6 mmol/L (ref 3.5–5.1)
Sodium: 138 mmol/L (ref 135–145)
Total Bilirubin: 1.3 mg/dL — ABNORMAL HIGH (ref 0.3–1.2)
Total Protein: 7.4 g/dL (ref 6.5–8.1)

## 2021-04-08 LAB — RESP PANEL BY RT-PCR (FLU A&B, COVID) ARPGX2
Influenza A by PCR: NEGATIVE
Influenza B by PCR: NEGATIVE
SARS Coronavirus 2 by RT PCR: NEGATIVE

## 2021-04-08 LAB — ACETAMINOPHEN LEVEL: Acetaminophen (Tylenol), Serum: <10 ug/mL — ABNORMAL LOW (ref 10–30)

## 2021-04-08 LAB — ETHANOL: Alcohol, Ethyl (B): <10 mg/dL (ref <10)

## 2021-04-08 LAB — SALICYLATE LEVEL: Salicylate Lvl: <7 mg/dL — ABNORMAL LOW (ref 7.0–30.0)

## 2021-04-08 MED ORDER — METFORMIN HCL 500 MG PO TABS
1000.0000 mg | ORAL_TABLET | Freq: Two times a day (BID) | ORAL | Status: DC
  Administered 2021-04-08: 1000 mg via ORAL
  Filled 2021-04-08: qty 2

## 2021-04-08 MED ORDER — OLANZAPINE 5 MG PO TBDP
20.0000 mg | ORAL_TABLET | Freq: Two times a day (BID) | ORAL | Status: DC
  Administered 2021-04-08 – 2021-04-09 (×2): 20 mg via ORAL
  Filled 2021-04-08 (×2): qty 4

## 2021-04-08 MED ORDER — LEVOTHYROXINE SODIUM 50 MCG PO TABS: 125.0000 ug | ORAL_TABLET | Freq: Every day | ORAL | Status: DC

## 2021-04-08 MED ORDER — CLONAZEPAM 0.5 MG PO TABS
0.5000 mg | ORAL_TABLET | Freq: Two times a day (BID) | ORAL | Status: DC
  Administered 2021-04-08 – 2021-04-09 (×2): 0.5 mg via ORAL
  Filled 2021-04-08 (×2): qty 1

## 2021-04-08 MED ORDER — LISINOPRIL 5 MG PO TABS
2.5000 mg | ORAL_TABLET | Freq: Every day | ORAL | Status: DC
  Administered 2021-04-08: 2.5 mg via ORAL
  Filled 2021-04-08: qty 1

## 2021-04-08 MED ORDER — ASPIRIN EC 81 MG PO TBEC
81.0000 mg | DELAYED_RELEASE_TABLET | Freq: Every day | ORAL | Status: DC
  Administered 2021-04-08: 81 mg via ORAL
  Filled 2021-04-08: qty 1

## 2021-04-08 MED ORDER — FOLIC ACID 1 MG PO TABS
1.0000 mg | ORAL_TABLET | Freq: Every day | ORAL | Status: DC
  Administered 2021-04-08: 1 mg via ORAL
  Filled 2021-04-08: qty 1

## 2021-04-08 MED ORDER — BENZTROPINE MESYLATE 1 MG PO TABS
1.0000 mg | ORAL_TABLET | Freq: Two times a day (BID) | ORAL | Status: DC
  Administered 2021-04-08 – 2021-04-09 (×2): 1 mg via ORAL
  Filled 2021-04-08 (×2): qty 1

## 2021-04-08 MED ORDER — SIMVASTATIN 10 MG PO TABS: 20.0000 mg | ORAL_TABLET | Freq: Every day | ORAL | Status: DC

## 2021-04-08 MED ORDER — HYDROXYZINE HCL 25 MG PO TABS: 50.0000 mg | ORAL_TABLET | Freq: Two times a day (BID) | ORAL | Status: DC | PRN

## 2021-04-08 MED ORDER — HALOPERIDOL 5 MG PO TABS
15.0000 mg | ORAL_TABLET | Freq: Two times a day (BID) | ORAL | Status: DC
  Administered 2021-04-08 – 2021-04-09 (×2): 15 mg via ORAL
  Filled 2021-04-08 (×2): qty 3

## 2021-04-08 MED ORDER — ZIPRASIDONE MESYLATE 20 MG IM SOLR
20.0000 mg | Freq: Once | INTRAMUSCULAR | Status: AC
  Administered 2021-04-08: 20 mg via INTRAMUSCULAR

## 2021-04-08 MED ORDER — VITAMIN B-12 1000 MCG PO TABS
1000.0000 ug | ORAL_TABLET | Freq: Every day | ORAL | Status: DC
  Administered 2021-04-08: 1000 ug via ORAL
  Filled 2021-04-08 (×3): qty 1
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
## 2021-04-08 NOTE — ED Notes (Signed)
2 belongings bags at nurses desk
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
## 2021-04-08 NOTE — BH Assessment (Signed)
[name] attempted to access patient, however patient was unable to answer questions...  Pt stated he did not eat because he was fasting... pt stated he did not know why he was in the hospital as he believed someone lied on him....  [name] [name]
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
## 2021-04-08 NOTE — ED Notes (Signed)
IVC pending reassessment in am 10/3

## 2021-04-09 ENCOUNTER — Other Ambulatory Visit: Payer: Self-pay

## 2021-04-09 ENCOUNTER — Inpatient Hospital Stay
Admission: AD | Admit: 2021-04-09 | Discharge: 2021-04-20 | DRG: 885 | Disposition: A | Discharge status: Home / Self Care | Payer: Medicare Other | Source: Intra-hospital | Attending: Behavioral Health | Admitting: Behavioral Health

## 2021-04-09 ENCOUNTER — Encounter: Payer: Self-pay | Admitting: Psychiatry

## 2021-04-09 DIAGNOSIS — F209 Schizophrenia, unspecified: Principal | ICD-10-CM | POA: Diagnosis present

## 2021-04-09 DIAGNOSIS — E785 Hyperlipidemia, unspecified: Secondary | ICD-10-CM | POA: Diagnosis present

## 2021-04-09 DIAGNOSIS — G2401 Drug induced subacute dyskinesia: Secondary | ICD-10-CM | POA: Diagnosis present

## 2021-04-09 DIAGNOSIS — Z7984 Long term (current) use of oral hypoglycemic drugs: Secondary | ICD-10-CM | POA: Diagnosis not present

## 2021-04-09 DIAGNOSIS — F203 Undifferentiated schizophrenia: Principal | ICD-10-CM | POA: Diagnosis present

## 2021-04-09 DIAGNOSIS — E538 Deficiency of other specified B group vitamins: Secondary | ICD-10-CM | POA: Diagnosis present

## 2021-04-09 DIAGNOSIS — E039 Hypothyroidism, unspecified: Secondary | ICD-10-CM | POA: Diagnosis present

## 2021-04-09 DIAGNOSIS — D573 Sickle-cell trait: Secondary | ICD-10-CM | POA: Diagnosis present

## 2021-04-09 DIAGNOSIS — F419 Anxiety disorder, unspecified: Secondary | ICD-10-CM | POA: Diagnosis present

## 2021-04-09 DIAGNOSIS — Z888 Allergy status to other drugs, medicaments and biological substances status: Secondary | ICD-10-CM | POA: Diagnosis not present

## 2021-04-09 DIAGNOSIS — Z79899 Other long term (current) drug therapy: Secondary | ICD-10-CM | POA: Diagnosis not present

## 2021-04-09 DIAGNOSIS — Z7982 Long term (current) use of aspirin: Secondary | ICD-10-CM | POA: Diagnosis not present

## 2021-04-09 DIAGNOSIS — I10 Essential (primary) hypertension: Secondary | ICD-10-CM | POA: Diagnosis present

## 2021-04-09 DIAGNOSIS — Z8673 Personal history of transient ischemic attack (TIA), and cerebral infarction without residual deficits: Secondary | ICD-10-CM | POA: Diagnosis not present

## 2021-04-09 DIAGNOSIS — Z79890 Hormone replacement therapy (postmenopausal): Secondary | ICD-10-CM | POA: Diagnosis not present

## 2021-04-09 DIAGNOSIS — G40909 Epilepsy, unspecified, not intractable, without status epilepticus: Secondary | ICD-10-CM | POA: Diagnosis present

## 2021-04-09 DIAGNOSIS — Z9114 Patient's other noncompliance with medication regimen: Secondary | ICD-10-CM | POA: Diagnosis not present

## 2021-04-09 DIAGNOSIS — Z20822 Contact with and (suspected) exposure to covid-19: Secondary | ICD-10-CM | POA: Diagnosis present

## 2021-04-09 DIAGNOSIS — R7303 Prediabetes: Secondary | ICD-10-CM

## 2021-04-09 DIAGNOSIS — E119 Type 2 diabetes mellitus without complications: Secondary | ICD-10-CM | POA: Diagnosis present

## 2021-04-09 DIAGNOSIS — F200 Paranoid schizophrenia: Secondary | ICD-10-CM | POA: Diagnosis not present

## 2021-04-09 LAB — LIPID PANEL
Cholesterol: 156 mg/dL (ref 0–200)
HDL: 41 mg/dL (ref >40)
LDL Cholesterol: 102 mg/dL — ABNORMAL HIGH (ref 0–99)
Total CHOL/HDL Ratio: 3.8 RATIO
Triglycerides: 66 mg/dL (ref <150)
VLDL: 13 mg/dL (ref 0–40)

## 2021-04-09 MED ORDER — LISINOPRIL 5 MG PO TABS
2.5000 mg | ORAL_TABLET | Freq: Every day | ORAL | Status: DC
  Administered 2021-04-10: 2.5 mg via ORAL
  Filled 2021-04-09 (×2): qty 1

## 2021-04-09 MED ORDER — CLONAZEPAM 0.5 MG PO TABS
0.5000 mg | ORAL_TABLET | Freq: Two times a day (BID) | ORAL | Status: DC
  Administered 2021-04-10 – 2021-04-19 (×13): 0.5 mg via ORAL
  Filled 2021-04-09 (×19): qty 1

## 2021-04-09 MED ORDER — HALOPERIDOL LACTATE 5 MG/ML IJ SOLN: 15.0000 mg | Freq: Two times a day (BID) | INTRAMUSCULAR | Status: DC | PRN

## 2021-04-09 MED ORDER — OLANZAPINE 10 MG IM SOLR
20.0000 mg | Freq: Two times a day (BID) | INTRAMUSCULAR | Status: DC | PRN
  Filled 2021-04-09: qty 20

## 2021-04-09 MED ORDER — METFORMIN HCL 500 MG PO TABS
1000.0000 mg | ORAL_TABLET | Freq: Two times a day (BID) | ORAL | Status: DC
  Administered 2021-04-10 – 2021-04-19 (×8): 1000 mg via ORAL
  Filled 2021-04-09 (×12): qty 2

## 2021-04-09 MED ORDER — MAGNESIUM HYDROXIDE 400 MG/5ML PO SUSP: 30.0000 mL | Freq: Every day | ORAL | Status: DC | PRN

## 2021-04-09 MED ORDER — HYDROXYZINE HCL 50 MG PO TABS
50.0000 mg | ORAL_TABLET | Freq: Three times a day (TID) | ORAL | Status: DC | PRN
  Administered 2021-04-14 – 2021-04-15 (×2): 50 mg via ORAL
  Filled 2021-04-09 (×2): qty 1

## 2021-04-09 MED ORDER — FOLIC ACID 1 MG PO TABS
1.0000 mg | ORAL_TABLET | Freq: Every day | ORAL | Status: DC
  Administered 2021-04-10 – 2021-04-19 (×5): 1 mg via ORAL
  Filled 2021-04-09 (×8): qty 1

## 2021-04-09 MED ORDER — VITAMIN B-12 1000 MCG PO TABS
1000.0000 ug | ORAL_TABLET | Freq: Every day | ORAL | Status: DC
  Administered 2021-04-10 – 2021-04-19 (×5): 1000 ug via ORAL
  Filled 2021-04-09 (×12): qty 1

## 2021-04-09 MED ORDER — ASPIRIN EC 81 MG PO TBEC
81.0000 mg | DELAYED_RELEASE_TABLET | Freq: Every day | ORAL | Status: DC
  Administered 2021-04-10 – 2021-04-19 (×5): 81 mg via ORAL
  Filled 2021-04-09 (×8): qty 1

## 2021-04-09 MED ORDER — OLANZAPINE 5 MG PO TBDP
20.0000 mg | ORAL_TABLET | Freq: Two times a day (BID) | ORAL | Status: DC
  Administered 2021-04-10 – 2021-04-11 (×4): 20 mg via ORAL
  Filled 2021-04-09 (×7): qty 4

## 2021-04-09 MED ORDER — ALUM & MAG HYDROXIDE-SIMETH 200-200-20 MG/5ML PO SUSP: 30.0000 mL | ORAL | Status: DC | PRN

## 2021-04-09 MED ORDER — HYDROXYZINE HCL 50 MG PO TABS: 50.0000 mg | ORAL_TABLET | Freq: Two times a day (BID) | ORAL | Status: DC | PRN

## 2021-04-09 MED ORDER — BENZTROPINE MESYLATE 1 MG PO TABS
1.0000 mg | ORAL_TABLET | Freq: Two times a day (BID) | ORAL | Status: DC
  Administered 2021-04-10 – 2021-04-19 (×12): 1 mg via ORAL
  Filled 2021-04-09 (×17): qty 1

## 2021-04-09 MED ORDER — HALOPERIDOL 5 MG PO TABS
15.0000 mg | ORAL_TABLET | Freq: Two times a day (BID) | ORAL | Status: DC
  Administered 2021-04-10 – 2021-04-11 (×4): 15 mg via ORAL
  Filled 2021-04-09 (×7): qty 3

## 2021-04-09 MED ORDER — SIMVASTATIN 40 MG PO TABS
20.0000 mg | ORAL_TABLET | Freq: Every day | ORAL | Status: DC
  Administered 2021-04-11 – 2021-04-19 (×8): 20 mg via ORAL
  Filled 2021-04-09 (×8): qty 1

## 2021-04-09 MED ORDER — ACETAMINOPHEN 325 MG PO TABS: 650.0000 mg | ORAL_TABLET | Freq: Four times a day (QID) | ORAL | Status: DC | PRN

## 2021-04-09 MED ORDER — LEVOTHYROXINE SODIUM 50 MCG PO TABS
125.0000 ug | ORAL_TABLET | Freq: Every day | ORAL | Status: DC
  Administered 2021-04-12: 125 ug via ORAL
  Filled 2021-04-09 (×2): qty 1

## 2021-04-09 MED ORDER — OLANZAPINE 10 MG IM SOLR: 20.0000 mg | Freq: Two times a day (BID) | INTRAMUSCULAR | Status: DC | PRN

## 2021-04-09 NOTE — ED Notes (Signed)
Pt willing to take medication at this time. But will not allow staff to obtain ekg at this time. This RN will attempt to get ekg later.

## 2021-04-09 NOTE — ED Notes (Signed)
Gave food tray with juice. 
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
## 2021-04-09 NOTE — Plan of Care (Signed)
Patient new to the unit tonight, hasn't had time to progress  Problem: Education: Goal: Knowledge of [facility] Education information/materials will improve Outcome: Not Progressing Goal: Emotional status will improve Outcome: Not Progressing Goal: Mental status will improve Outcome: Not Progressing Goal: Verbalization of understanding the information provided will improve Outcome: Not Progressing   Problem: Safety: Goal: Periods of time without injury will increase Outcome: Not Progressing   Problem: Education: Goal: Will be free of psychotic symptoms Outcome: Not Progressing Goal: Knowledge of the prescribed therapeutic regimen will improve Outcome: Not Progressing   Problem: Self-Concept: Goal: Will verbalize positive feelings about self Outcome: Not Progressing   

## 2021-04-09 NOTE — ED Notes (Addendum)
Pt sleeping and refusing meds at this time when he woke up.  Pt states I have work to do.  Also refusing ekg to be done at this time

## 2021-04-09 NOTE — ED Notes (Signed)
While doing check rounding, attempted to collect pt vitals, pt refused.

## 2021-04-09 NOTE — Tx Team (Signed)
Initial Treatment Plan 04/09/2021 8:29 PM [name] [id number]:[id number]    PATIENT STRESSORS: Marital or family conflict   Medication change or noncompliance     PATIENT STRENGTHS: Religious Affiliation  Supportive family/friends    PATIENT IDENTIFIED PROBLEMS: Acute Psychosis  Anxiety                   DISCHARGE CRITERIA:  Improved stabilization in mood, thinking, and/or behavior Verbal commitment to aftercare and medication compliance  PRELIMINARY DISCHARGE PLAN: Outpatient therapy Return to previous living arrangement  PATIENT/FAMILY INVOLVEMENT: This treatment plan has been presented to and reviewed with the patient, [name].  The patient has been given the opportunity to ask questions and make suggestions.  Elmyra Ricks, RN 04/09/2021, 8:29 PM

## 2021-04-09 NOTE — Progress Notes (Signed)
Patient admitted from [facility] - ED, report received from Amy, RN. Pt presents responding to internal stimuli and hyper-religious. Patient did allow [place] to get his V/S done but refused skin assessment and signing his paperwork. Patient presents with some thought blocking. Patient given education, support, and encouragement to be active in his treatment plan. Pt oriented to his room and the unit. Patient given snack. Patient being monitored Q 15 minutes for safety per unit protocol. Pt remains safe on the unit.

## 2021-04-09 NOTE — ED Notes (Signed)
IVC GOING  TO  [facility]  MED UNIT  TONIGHT
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
## 2021-04-09 NOTE — ED Notes (Signed)
Report given to cleo rn BMU nurse

## 2021-04-09 NOTE — ED Notes (Signed)
IVC / pending reassessment in the AM. 

## 2021-04-09 NOTE — BH Assessment (Addendum)
Patient is to be admitted to [facility] BMU tonight 04/09/21 after 7:30pm by Dr.  Toni Amend .  Attending Physician will be Dr. Neale Burly.   Patient has been assigned to room 309, by [facility] Charge Nurse Mosier.    ER staff is aware of the admission: Misty Stanley, ER Secretary   Dr. Katrinka Blazing, ER MD  Amy, Patient's Nurse  Sue Lush, Patient Access.

## 2021-04-09 NOTE — ED Provider Notes (Signed)
Emergency Medicine Observation Re-evaluation Note  [name] is a 65 y.o. male, seen on rounds today.  Pt initially presented to the ED for complaints of Psychiatric Evaluation  Currently, the patient is resting   Physical Exam  Blood pressure (!) 145/82, pulse 91, temperature 97.8 F (36.6 C), temperature source Oral, resp. rate 16, height 6\' 3"  (1.905 m), weight 101 kg, SpO2 97 %.  Physical Exam General: No apparent distress Neuro: resting      ED Course / MDM     I have reviewed the labs performed to date as well as medications administered while in observation.  Recent changes in the last 24 hours include none   Plan   Current plan is to continue to wait for psych plan/placement if felt warranted  Patient is under full IVC at this time.   , MD 04/09/21 0130

## 2021-04-09 NOTE — Progress Notes (Signed)
   04/09/21 1100  Clinical Encounter Type  Visited With Patient  Visit Type Initial;Psychological support;Spiritual support;Social support  Referral From Nurse  Consult/Referral To [facility] visited with PT as requested. PT was able to tell his origin of faith. PT explored theological beliefs on heaven and repentance. Chaplain also gave PT a Bible, like he requested. Chaplain offered emotional support.  Posey Boyer, M. Div.

## 2021-04-09 NOTE — Consult Note (Signed)
[facility] Face-to-Face Psychiatry Consult   Reason for Consult: Consult for 65 year old man with schizophrenia well-known to the psychiatric service brought here from his group home because of agitation and medicine noncompliance Referring Physician: Darnelle Catalan Patient Identification: [name] MRN:  [id number] Principal Diagnosis: Schizophrenia, undifferentiated (HCC) Diagnosis:  Principal Problem:   Schizophrenia, undifferentiated (HCC) Active Problems:   Diabetes (HCC)   Hypothyroid   Hyperlipidemia   HTN (hypertension)   Tardive dyskinesia   Noncompliance   Total Time spent with patient: 1 hour  Subjective:   [name] is a 65 y.o. male patient admitted with.  "I have got the right of self persecution"  65 year old man with a history of schizophrenia.  Well-known to the psychiatric service.  Brought here from his group home under commitment reporting that he has been refusing to eat or take his medicine for several days.  Reports that he became aggressive with staff members and threw some objects around in the group home.  On interview the patient is extremely disorganized to the point that it makes getting a history quite difficult although he did say that he had taken the basketball goal down off of one of the inside doors and thrown it because he was angry that they moved it from 1 door to another.  All the rest of his speech was hyper religious and most of it nonsensical.  Could not really clarify to what degree he was fasting but it is reported that he is not eating here either.  So far has refused medication.  This is very much his normal pattern that he will come into the hospital refusing to eat or drink or take medicine and take several days to improve.  No report of any clear stress.  Patient was cooperative with the conversation even if he was disorganized was not threatening or aggressive.  No evidence of self-harm. Past Psychiatric History: Long history of schizophrenia and  multiple visits to the ER and prior admissions.  What we are seeing now is his typical pattern that he will start to get psychotic and then decide he needs to fast for religious reasons and stop eating and drinking and taking his medicine which of course makes all of his psychosis worse.  Typically clears up after a short period of time.  No past history identified of suicide attempts.  Risk to Self:   Risk to Others:   Prior Inpatient Therapy:   Prior Outpatient Therapy:    Past Medical History:  Past Medical History:  Diagnosis Date   Diabetes mellitus without complication (HCC)    HTN (hypertension)    Hyperammonemia (HCC) While on Depakote   Hyperlipemia    Lithium toxicity    Neutropenia (HCC)    Schizophrenia (HCC)    Seizure (HCC) While toxic on Depakote   Sickle cell trait (HCC)    Thyroid disease    TIA (transient ischemic attack) in 2009   History reviewed. No pertinent surgical history. Family History: History reviewed. No pertinent family history. Family Psychiatric  History: None reported Social History:  Social History   Substance and Sexual Activity  Alcohol Use No     Social History   Substance and Sexual Activity  Drug Use Not on file    Social History   Socioeconomic History   Marital status: Single    Spouse name: Not on file   Number of children: Not on file   Years of education: Not on file   Highest education level: Not  on file  Occupational History   Not on file  Tobacco Use   Smoking status: Unknown   Smokeless tobacco: Not on file  Substance and Sexual Activity   Alcohol use: No   Drug use: Not on file   Sexual activity: Not on file  Other Topics Concern   Not on file  Social History Narrative   Not on file   Social Determinants of Health   Financial Resource Strain: Not on file  Food Insecurity: Not on file  Transportation Needs: Not on file  Physical Activity: Not on file  Stress: Not on file  Social Connections: Not on file    Additional Social History:    Allergies:   Allergies  Allergen Reactions   Depakote [Valproic Acid] Other (See Comments)    Reaction:  Hyperammonemia    Lithium Other (See Comments)    Pt has a prior history of Lithium toxicity.       Labs:  Results for orders placed or performed during the hospital encounter of 04/08/21 (from the past 48 hour(s))  Comprehensive metabolic panel     Status: Abnormal   Collection Time: 04/08/21  2:02 PM  Result Value Ref Range   Sodium 138 135 - 145 mmol/L   Potassium 3.6 3.5 - 5.1 mmol/L   Chloride 102 98 - 111 mmol/L   CO2 28 22 - 32 mmol/L   Glucose, Bld 126 (H) 70 - 99 mg/dL    Comment: Glucose reference range applies only to samples taken after fasting for at least 8 hours.   BUN 17 8 - 23 mg/dL   Creatinine, Ser 6.57 0.61 - 1.24 mg/dL   Calcium 8.8 (L) 8.9 - 10.3 mg/dL   Total Protein 7.4 6.5 - 8.1 g/dL   Albumin 4.1 3.5 - 5.0 g/dL   AST 22 15 - 41 U/L   ALT 8 0 - 44 U/L   Alkaline Phosphatase 51 38 - 126 U/L   Total Bilirubin 1.3 (H) 0.3 - 1.2 mg/dL   GFR, Estimated >84 >69 mL/min    Comment: (NOTE) Calculated using the CKD-EPI Creatinine Equation (2021)    Anion gap 8 5 - 15    Comment: Performed at [facility], [address] [place], [place] [address]  Ethanol     Status: None   Collection Time: 04/08/21  2:02 PM  Result Value Ref Range   Alcohol, Ethyl (B) <10 <10 mg/dL    Comment: (NOTE) Lowest detectable limit for serum alcohol is 10 mg/dL.  For medical purposes only. Performed at [facility], [address] [place], [place] [address]   Salicylate level     Status: Abnormal   Collection Time: 04/08/21  2:02 PM  Result Value Ref Range   Salicylate Lvl <7.0 (L) 7.0 - 30.0 mg/dL    Comment: Performed at [facility], [address] [place], [place] [address]  Acetaminophen level     Status: Abnormal   Collection Time: 04/08/21  2:02 PM  Result Value Ref Range   Acetaminophen  (Tylenol), Serum <10 (L) 10 - 30 ug/mL    Comment: (NOTE) Therapeutic concentrations vary significantly. A range of 10-30 ug/mL  may be an effective concentration for many patients. However, some  are best treated at concentrations outside of this range. Acetaminophen concentrations >150 ug/mL at 4 hours after ingestion  and >50 ug/mL at 12 hours after ingestion are often associated with  toxic reactions.  Performed at [facility], [address] [place], [place] [address]   cbc     Status: Abnormal   Collection Time: 04/08/21  2:02 PM  Result Value Ref Range   WBC 3.2 (L) 4.0 - 10.5 K/uL   RBC 5.29 4.22 - 5.81 MIL/uL   Hemoglobin 14.1 13.0 - 17.0 g/dL   HCT 84.1 32.4 - 40.1 %   MCV 79.8 (L) 80.0 - 100.0 fL   MCH 26.7 26.0 - 34.0 pg   MCHC 33.4 30.0 - 36.0 g/dL   RDW 02.7 25.3 - 66.4 %   Platelets 219 150 - 400 K/uL   nRBC 0.0 0.0 - 0.2 %    Comment: Performed at [facility], [address], [place], [place] [address]  Resp Panel by RT-PCR (Flu A&B, Covid) Nasopharyngeal Swab     Status: None   Collection Time: 04/08/21  9:09 PM   Specimen: Nasopharyngeal Swab; Nasopharyngeal(NP) swabs in vial transport medium  Result Value Ref Range   SARS Coronavirus 2 by RT PCR NEGATIVE NEGATIVE    Comment: (NOTE) SARS-CoV-2 target nucleic acids are NOT DETECTED.  The SARS-CoV-2 RNA is generally detectable in upper respiratory specimens during the acute phase of infection. The lowest concentration of SARS-CoV-2 viral copies this assay can detect is 138 copies/mL. A negative result does not preclude SARS-Cov-2 infection and should not be used as the sole basis for treatment or other patient management decisions. A negative result may occur with  improper specimen collection/handling, submission of specimen other than nasopharyngeal swab, presence of viral mutation(s) within the areas targeted by this assay, and inadequate number of viral copies(<138 copies/mL). A  negative result must be combined with clinical observations, patient history, and epidemiological information. The expected result is Negative.  Fact Sheet for Patients:  BloggerCourse.com  Fact Sheet for Healthcare Providers:  SeriousBroker.it  This test is no t yet approved or cleared by the [place] FDA and  has been authorized for detection and/or diagnosis of SARS-CoV-2 by FDA under an Emergency Use Authorization (EUA). This EUA will remain  in effect (meaning this test can be used) for the duration of the COVID-19 declaration under Section 564(b)(1) of the Act, 21 U.S.C.section 360bbb-3(b)(1), unless the authorization is terminated  or revoked sooner.       Influenza A by PCR NEGATIVE NEGATIVE   Influenza B by PCR NEGATIVE NEGATIVE    Comment: (NOTE) The Xpert Xpress SARS-CoV-2/FLU/RSV plus assay is intended as an aid in the diagnosis of influenza from Nasopharyngeal swab specimens and should not be used as a sole basis for treatment. Nasal washings and aspirates are unacceptable for Xpert Xpress SARS-CoV-2/FLU/RSV testing.  Fact Sheet for Patients: BloggerCourse.com  Fact Sheet for Healthcare Providers: SeriousBroker.it  This test is not yet approved or cleared by the [place] FDA and has been authorized for detection and/or diagnosis of SARS-CoV-2 by FDA under an Emergency Use Authorization (EUA). This EUA will remain in effect (meaning this test can be used) for the duration of the COVID-19 declaration under Section 564(b)(1) of the Act, 21 U.S.C. section 360bbb-3(b)(1), unless the authorization is terminated or revoked.  Performed at [facility], [address] [place], [place] [address]     Current Facility-Administered Medications  Medication Dose Route Frequency Provider Last Rate Last Admin   aspirin EC tablet 81 mg  81 mg Oral Daily  Gilles Chiquito, MD   81 mg at 04/08/21 1542   benztropine (COGENTIN) tablet 1 mg  1 mg Oral BID WC Katrinka Blazing,  Zerita Boers, MD   1 mg at 04/08/21 1954   clonazePAM (KLONOPIN) tablet 0.5 mg  0.5 mg Oral BID WC Gilles Chiquito, MD   0.5 mg at 04/08/21 1954   folic acid (FOLVITE) tablet 1 mg  1 mg Oral Daily Gilles Chiquito, MD   1 mg at 04/08/21 1542   haloperidol (HALDOL) tablet 15 mg  15 mg Oral BID WC Gilles Chiquito, MD   15 mg at 04/08/21 1954   hydrOXYzine (ATARAX/VISTARIL) tablet 50 mg  50 mg Oral BID PRN Gilles Chiquito, MD       levothyroxine (SYNTHROID) tablet 125 mcg  125 mcg Oral QAC breakfast Gilles Chiquito, MD       lisinopril (ZESTRIL) tablet 2.5 mg  2.5 mg Oral Daily Gilles Chiquito, MD   2.5 mg at 04/08/21 1542   metFORMIN (GLUCOPHAGE) tablet 1,000 mg  1,000 mg Oral BID WC Gilles Chiquito, MD   1,000 mg at 04/08/21 1954   OLANZapine zydis (ZYPREXA) disintegrating tablet 20 mg  20 mg Oral BID WC Gilles Chiquito, MD   20 mg at 04/08/21 1954   simvastatin (ZOCOR) tablet 20 mg  20 mg Oral QHS Gilles Chiquito, MD       vitamin B-12 (CYANOCOBALAMIN) tablet 1,000 mcg  1,000 mcg Oral Daily Gilles Chiquito, MD   1,000 mcg at 04/08/21 1542   Current Outpatient Medications  Medication Sig Dispense Refill   aspirin EC 81 MG tablet Take 81 mg by mouth daily.     benztropine (COGENTIN) 1 MG tablet Take 1 tablet (1 mg total) by mouth 2 (two) times daily with a meal. 60 tablet 2   clonazePAM (KLONOPIN) 0.5 MG tablet Take 1 tablet (0.5 mg total) by mouth 2 (two) times daily with a meal. 60 tablet 0   divalproex (DEPAKOTE SPRINKLE) 125 MG capsule Take 8 capsules (1,000 mg total) by mouth 2 (two) times daily with a meal. 240 capsule 2   folic acid (FOLVITE) 1 MG tablet Take 1 mg by mouth daily.     haloperidol (HALDOL) 10 MG tablet Take 1.5 tablets (15 mg total) by mouth 2 (two) times daily with a meal. 90 tablet 2   levothyroxine (SYNTHROID, LEVOTHROID) 125 MCG tablet Take 1 tablet (125 mcg  total) by mouth daily before breakfast. 30 tablet 0   metFORMIN (GLUCOPHAGE) 1000 MG tablet Take 1 tablet (1,000 mg total) by mouth 2 (two) times daily with a meal. 60 tablet 0   OLANZapine zydis (ZYPREXA) 20 MG disintegrating tablet Take 1 tablet (20 mg total) by mouth 2 (two) times daily with a meal. 60 tablet 2   simvastatin (ZOCOR) 20 MG tablet Take 20 mg by mouth at bedtime.     traZODone (DESYREL) 150 MG tablet Take 150 mg by mouth at bedtime.     vitamin B-12 (CYANOCOBALAMIN) 1000 MCG tablet Take 1,000 mcg by mouth daily.      Musculoskeletal: Strength & Muscle Tone: within normal limits Gait & Station: normal Patient leans: N/A            Psychiatric Specialty Exam:  Presentation  General Appearance: Disheveled  Eye Contact:Fair  Speech:Garbled  Speech Volume:Normal  Handedness: No data recorded  Mood and Affect  Mood:Dysphoric  Affect:Blunt   Thought Process  Thought Processes:Disorganized  Descriptions of Associations:Loose  Orientation:Partial  Thought Content:Illogical; Scattered  History of Schizophrenia/Schizoaffective disorder:No data recorded Duration of Psychotic Symptoms:No data recorded Hallucinations:Hallucinations: -- (UTA. May be  reponding to internal stimuli)  Ideas of Reference:No data recorded Suicidal Thoughts:No data recorded Homicidal Thoughts:No data recorded  Sensorium  Memory:Remote Poor; Immediate Poor; Recent Poor  Judgment:Impaired  Insight:Lacking   Executive Functions  Concentration:Poor  Attention Span:Poor  Recall:Poor  Fund of Knowledge:Poor  Language:Poor   Psychomotor Activity  Psychomotor Activity:Psychomotor Activity: Normal   Assets  Assets:Resilience   Sleep  Sleep:Sleep: Poor   Physical Exam: Physical Exam Vitals and nursing note reviewed.  Constitutional:      Appearance: Normal appearance.  HENT:     Head: Normocephalic and atraumatic.     Mouth/Throat:     Pharynx:  Oropharynx is clear.  Eyes:     Pupils: Pupils are equal, round, and reactive to light.  Cardiovascular:     Rate and Rhythm: Normal rate and regular rhythm.  Pulmonary:     Effort: Pulmonary effort is normal.     Breath sounds: Normal breath sounds.  Abdominal:     General: Abdomen is flat.     Palpations: Abdomen is soft.  Musculoskeletal:        General: Normal range of motion.  Skin:    General: Skin is warm and dry.  Neurological:     General: No focal deficit present.     Mental Status: He is alert. Mental status is at baseline.  Psychiatric:        Attention and Perception: He is inattentive.        Mood and Affect: Mood normal. Affect is blunt and inappropriate.        Speech: Speech is tangential.        Behavior: Behavior is not agitated or aggressive.        Thought Content: Thought content is paranoid and delusional. Thought content does not include homicidal or suicidal ideation.        Cognition and Memory: Cognition is impaired. Memory is impaired.   Review of Systems  Constitutional: Negative.   HENT: Negative.    Eyes: Negative.   Respiratory: Negative.    Cardiovascular: Negative.   Gastrointestinal: Negative.   Musculoskeletal: Negative.   Skin: Negative.   Neurological: Negative.   Psychiatric/Behavioral:  Positive for hallucinations and memory loss. Negative for depression, substance abuse and suicidal ideas. The patient is nervous/anxious and has insomnia.   Blood pressure 121/83, pulse 84, temperature 97.8 F (36.6 C), temperature source Oral, resp. rate 15, height 6\' 3"  (1.905 m), weight 101 kg, SpO2 100 %. Body mass index is 27.83 kg/m.  Treatment Plan Summary: Medication management and Plan patient has been restarted on all of his normal medicines including olanzapine haloperidol and Depakote as well as his medicines for his blood pressure and blood sugar.  Right now refusing medications.  If this continues we will start a forced medication plan to  get his Haldol into him and the olanzapine as well.  Patient is recommended for admission to the psychiatric ward.  Case reviewed with emergency room physician and [name].  Ordered hemoglobin A1c lipid panel and EKG as well.  Disposition: Recommend psychiatric Inpatient admission when medically cleared. Supportive therapy provided about ongoing stressors.  , MD 04/09/2021 2:10 PM

## 2021-04-09 NOTE — ED Notes (Signed)
Resumed care from annie rn.  Pt sleeping.

## 2021-04-09 NOTE — ED Notes (Signed)
Patient refused EKG.

## 2021-04-09 NOTE — ED Notes (Signed)
Unable to obtain ekg at this time.

## 2021-04-10 DIAGNOSIS — F200 Paranoid schizophrenia: Secondary | ICD-10-CM

## 2021-04-10 LAB — HEMOGLOBIN A1C
Hgb A1c MFr Bld: 6 % — ABNORMAL HIGH (ref 4.8–5.6)
Mean Plasma Glucose: 126 mg/dL

## 2021-04-10 NOTE — Plan of Care (Signed)
Pt denies depression, anxiety, SI, HI and AVH. Pt was educated on care plan and verbalizes understanding. Torrie Mayers RN Problem: Education: Goal: Charity fundraiser Education information/materials will improve Outcome: Not Progressing Goal: Emotional status will improve Outcome: Progressing Goal: Mental status will improve Outcome: Not Progressing Goal: Verbalization of understanding the information provided will improve Outcome: Not Progressing   Problem: Safety: Goal: Periods of time without injury will increase Outcome: Progressing   Problem: Education: Goal: Will be free of psychotic symptoms Outcome: Not Progressing Goal: Knowledge of the prescribed therapeutic regimen will improve Outcome: Progressing   Problem: Self-Concept: Goal: Will verbalize positive feelings about self Outcome: Not Progressing

## 2021-04-10 NOTE — B[facility] Counselor (Signed)
CSW attempted to complete the assessment with the patient, however, patient was difficult to understand.  CSW will attempt again later or with  the patient's legal guardian.  Penni Homans, MSW, LCSW 04/10/2021 10:44 AM

## 2021-04-10 NOTE — Progress Notes (Signed)
Pt is still disorganized and hyper religious. After several prompts he does take his medication. Pt stays withdrawn in his room at times but other times sits in the dayroom. He has been calm and cooperative. He is still wide eyed and glares with intensity at times. Torrie Mayers RN

## 2021-04-10 NOTE — Progress Notes (Signed)
Recreation Therapy Notes   Date: 04/10/2021  Time: 10:00 am   Location: Court yard  Behavioral response: N/A   Intervention Topic: Leisure     Discussion/Intervention: Patient did not attend group.   Clinical Observations/Feedback:  Patient did not attend group.   Mashal Slavick LRT/CTRS        Mae Denunzio 04/10/2021 1:33 PM

## 2021-04-10 NOTE — Progress Notes (Signed)
The patient gave me a long distance number to call. When I called it was a [place]. When I realized who it was I asked him. He said that he tried out for their basketball team. Torrie Mayers RN

## 2021-04-10 NOTE — Group Note (Signed)
[facility] LCSW Group Therapy Note   Group Date: 04/10/2021 Start Time: 1300 End Time: 1400  Type of Therapy/Topic:  Group Therapy:  Feelings about Diagnosis  Participation Level:  None   Mood: dissociative    Description of Group:    This group will allow patients to explore their thoughts and feelings about diagnoses they have received. Patients will be guided to explore their level of understanding and acceptance of these diagnoses. Facilitator will encourage patients to process their thoughts and feelings about the reactions of others to their diagnosis, and will guide patients in identifying ways to discuss their diagnosis with significant others in their lives. This group will be process-oriented, with patients participating in exploration of their own experiences as well as giving and receiving support and challenge from other group members.   Therapeutic Goals: 1. Patient will demonstrate understanding of diagnosis as evidence by identifying two or more symptoms of the disorder:  2. Patient will be able to express two feelings regarding the diagnosis 3. Patient will demonstrate ability to communicate their needs through discussion and/or role plays  Summary of Patient Progress: Patient was present for the entirety of the group session. Patient remains largely disorganized, unable to understand speech. Unable to benefit from group setting at this time. When ask questions, patient responses are grossly tangential.    Therapeutic Modalities:   Cognitive Behavioral Therapy Brief Therapy Feelings Identification    Corky Crafts, LCSWA

## 2021-04-10 NOTE — Progress Notes (Signed)
Patient calm during assessment but wouldn't answer questions directly. Patient presents with religiosity and responding to internal stimuli. Patient observed interacting appropriately with staff and peers on the unit. Patient given education about medications, Pt still refused. Patient given support and encouragement to be active in his treatment plan. Pt being monitored Q 15 minutes for safety per unit protocol. Pt remains safe on the unit.  

## 2021-04-10 NOTE — H&P (Signed)
Psychiatric Admission Assessment Adult  Patient Identification: [name] MRN:  [id number] Date of Evaluation:  04/10/2021 Chief Complaint:  Schizophrenia (HCC) [F20.9] Principal Diagnosis: Schizophrenia (HCC) Diagnosis:  Principal Problem:   Schizophrenia (HCC) Active Problems:   Diabetes (HCC)   Hypothyroid   Hyperlipidemia  CC "God will save me."  History of Present Illness: 65 year old male with schizophrenia brought to emergency room by group home for increasing agitation and medication non-compliance. Patient seen one-on-one today. His thought process is extremely disorganized, and speech is slurred. Most of his speech was unintelligible, but words that provider could understand were hyperreligous in nature. He states "I have the right of self persecution," "My God will save me." He is heard saying Jesus and God numerous times as well. He appears to be responding to internal stimuli at time of interview. However, patient not aggressive during interview. Per chart review, he threw objects in the group home. With significant prompting from staff, he did eat breakfast and take his medications.   Call placed Kylie Marrow Adult care supervisor with [place] guardianship services [phone]: Voicemail left to update that patient was in the hospital.   Call placed to [facility] from facility- [phone]: HIPPA compliant voicemail left.  Associated Signs/Symptoms: Depression Symptoms:  insomnia, Duration of Depression Symptoms: No data recorded (Hypo) Manic Symptoms:  Distractibility, Flight of Ideas, Hallucinations, Impulsivity, Anxiety Symptoms:  Excessive Worry, Psychotic Symptoms:  Delusions, Hallucinations: Auditory Paranoia, PTSD Symptoms: Negative Total Time spent with patient: 1 hour  Past Psychiatric History: Multiple prior ED visits and inpatient admission. History of noncompliance which leads to psychosis and feeling that he needs to fast for religious reasons. No  history of suicide attempts.   Is the patient at risk to self? Yes.    Has the patient been a risk to self in the past 6 months? No.  Has the patient been a risk to self within the distant past? Yes.    Is the patient a risk to others? Yes.    Has the patient been a risk to others in the past 6 months? No.  Has the patient been a risk to others within the distant past? No.   Prior Inpatient Therapy:   Prior Outpatient Therapy:    Alcohol Screening: 1. How often do you have a drink containing alcohol?: Never 2. How many drinks containing alcohol do you have on a typical day when you are drinking?: 1 or 2 3. How often do you have six or more drinks on one occasion?: Never AUDIT-C Score: 0 4. How often during the last year have you found that you were not able to stop drinking once you had started?: Never 5. How often during the last year have you failed to do what was normally expected from you because of drinking?: Never 6. How often during the last year have you needed a first drink in the morning to get yourself going after a heavy drinking session?: Never 7. How often during the last year have you had a feeling of guilt of remorse after drinking?: Never 8. How often during the last year have you been unable to remember what happened the night before because you had been drinking?: Never 9. Have you or someone else been injured as a result of your drinking?: No 10. Has a relative or friend or a doctor or another health worker been concerned about your drinking or suggested you cut down?: No Alcohol Use Disorder Identification Test Final Score (AUDIT): 0 Substance Abuse History in  the last 12 months:  No. Consequences of Substance Abuse: Negative Previous Psychotropic Medications: Yes  Psychological Evaluations: Yes  Past Medical History:  Past Medical History:  Diagnosis Date   Diabetes mellitus without complication (HCC)    HTN (hypertension)    Hyperammonemia (HCC) While on  Depakote   Hyperlipemia    Lithium toxicity    Neutropenia (HCC)    Schizophrenia (HCC)    Seizure (HCC) While toxic on Depakote   Sickle cell trait (HCC)    Thyroid disease    TIA (transient ischemic attack) in 2009   History reviewed. No pertinent surgical history. Family History: History reviewed. No pertinent family history. Family Psychiatric  History: Unknown Tobacco Screening:   Social History:  Social History   Substance and Sexual Activity  Alcohol Use No     Social History   Substance and Sexual Activity  Drug Use Not Currently    Additional Social History:                           Allergies:   Allergies  Allergen Reactions   Depakote [Valproic Acid] Other (See Comments)    Reaction:  Hyperammonemia    Lithium Other (See Comments)    Pt has a prior history of Lithium toxicity.      Lab Results:  Results for orders placed or performed during the hospital encounter of 04/08/21 (from the past 48 hour(s))  Comprehensive metabolic panel     Status: Abnormal   Collection Time: 04/08/21  2:02 PM  Result Value Ref Range   Sodium 138 135 - 145 mmol/L   Potassium 3.6 3.5 - 5.1 mmol/L   Chloride 102 98 - 111 mmol/L   CO2 28 22 - 32 mmol/L   Glucose, Bld 126 (H) 70 - 99 mg/dL    Comment: Glucose reference range applies only to samples taken after fasting for at least 8 hours.   BUN 17 8 - 23 mg/dL   Creatinine, Ser 4.09 0.61 - 1.24 mg/dL   Calcium 8.8 (L) 8.9 - 10.3 mg/dL   Total Protein 7.4 6.5 - 8.1 g/dL   Albumin 4.1 3.5 - 5.0 g/dL   AST 22 15 - 41 U/L   ALT 8 0 - 44 U/L   Alkaline Phosphatase 51 38 - 126 U/L   Total Bilirubin 1.3 (H) 0.3 - 1.2 mg/dL   GFR, Estimated >81 >19 mL/min    Comment: (NOTE) Calculated using the CKD-EPI Creatinine Equation (2021)    Anion gap 8 5 - 15    Comment: Performed at [facility], [address] [place], [place] [address]  Ethanol     Status: None   Collection Time: 04/08/21  2:02 PM  Result  Value Ref Range   Alcohol, Ethyl (B) <10 <10 mg/dL    Comment: (NOTE) Lowest detectable limit for serum alcohol is 10 mg/dL.  For medical purposes only. Performed at [facility], [address] [place], [place] [address]   Salicylate level     Status: Abnormal   Collection Time: 04/08/21  2:02 PM  Result Value Ref Range   Salicylate Lvl <7.0 (L) 7.0 - 30.0 mg/dL    Comment: Performed at [facility], [address] [place], [place] [address]  Acetaminophen level     Status: Abnormal   Collection Time: 04/08/21  2:02 PM  Result Value Ref Range   Acetaminophen (Tylenol), Serum <10 (L) 10 - 30 ug/mL  Comment: (NOTE) Therapeutic concentrations vary significantly. A range of 10-30 ug/mL  may be an effective concentration for many patients. However, some  are best treated at concentrations outside of this range. Acetaminophen concentrations >150 ug/mL at 4 hours after ingestion  and >50 ug/mL at 12 hours after ingestion are often associated with  toxic reactions.  Performed at [facility], [address] [place], [place] [address]   cbc     Status: Abnormal   Collection Time: 04/08/21  2:02 PM  Result Value Ref Range   WBC 3.2 (L) 4.0 - 10.5 K/uL   RBC 5.29 4.22 - 5.81 MIL/uL   Hemoglobin 14.1 13.0 - 17.0 g/dL   HCT 29.5 28.4 - 13.2 %   MCV 79.8 (L) 80.0 - 100.0 fL   MCH 26.7 26.0 - 34.0 pg   MCHC 33.4 30.0 - 36.0 g/dL   RDW 44.0 10.2 - 72.5 %   Platelets 219 150 - 400 K/uL   nRBC 0.0 0.0 - 0.2 %    Comment: Performed at [facility], [address] [place], [place] [address]  Hemoglobin A1c     Status: Abnormal   Collection Time: 04/08/21  2:02 PM  Result Value Ref Range   Hgb A1c MFr Bld 6.0 (H) 4.8 - 5.6 %    Comment: (NOTE)         Prediabetes: 5.7 - 6.4         Diabetes: >6.4         Glycemic control for adults with diabetes: <7.0    Mean Plasma Glucose 126 mg/dL    Comment: (NOTE) Performed At: [facility]  [place] [address] [place], [place] [id number] Jolene Schimke MD GL:[id number]   Lipid panel     Status: Abnormal   Collection Time: 04/08/21  2:02 PM  Result Value Ref Range   Cholesterol 156 0 - 200 mg/dL   Triglycerides 66 <188 mg/dL   HDL 41 >41 mg/dL   Total CHOL/HDL Ratio 3.8 RATIO   VLDL 13 0 - 40 mg/dL   LDL Cholesterol 660 (H) 0 - 99 mg/dL    Comment:        Total Cholesterol/HDL:CHD Risk Coronary Heart Disease Risk Table                     Men   Women  1/2 Average Risk   3.4   3.3  Average Risk       5.0   4.4  2 X Average Risk   9.6   7.1  3 X Average Risk  23.4   11.0        Use the calculated Patient Ratio above and the CHD Risk Table to determine the patient's CHD Risk.        ATP III CLASSIFICATION (LDL):  <100     mg/dL   Optimal  630-160  mg/dL   Near or Above                    Optimal  130-159  mg/dL   Borderline  109-323  mg/dL   High  >557     mg/dL   Very High Performed at [facility], [address] [place], [place] [address]   Resp Panel by RT-PCR (Flu A&B, Covid) Nasopharyngeal Swab     Status: None   Collection Time: 04/08/21  9:09 PM   Specimen: Nasopharyngeal Swab; Nasopharyngeal(NP) swabs in vial transport medium  Result Value  Ref Range   SARS Coronavirus 2 by RT PCR NEGATIVE NEGATIVE    Comment: (NOTE) SARS-CoV-2 target nucleic acids are NOT DETECTED.  The SARS-CoV-2 RNA is generally detectable in upper respiratory specimens during the acute phase of infection. The lowest concentration of SARS-CoV-2 viral copies this assay can detect is 138 copies/mL. A negative result does not preclude SARS-Cov-2 infection and should not be used as the sole basis for treatment or other patient management decisions. A negative result may occur with  improper specimen collection/handling, submission of specimen other than nasopharyngeal swab, presence of viral mutation(s) within the areas targeted by this assay, and inadequate number  of viral copies(<138 copies/mL). A negative result must be combined with clinical observations, patient history, and epidemiological information. The expected result is Negative.  Fact Sheet for Patients:  BloggerCourse.com  Fact Sheet for Healthcare Providers:  SeriousBroker.it  This test is no t yet approved or cleared by the [place] FDA and  has been authorized for detection and/or diagnosis of SARS-CoV-2 by FDA under an Emergency Use Authorization (EUA). This EUA will remain  in effect (meaning this test can be used) for the duration of the COVID-19 declaration under Section 564(b)(1) of the Act, 21 U.S.C.section 360bbb-3(b)(1), unless the authorization is terminated  or revoked sooner.       Influenza A by PCR NEGATIVE NEGATIVE   Influenza B by PCR NEGATIVE NEGATIVE    Comment: (NOTE) The Xpert Xpress SARS-CoV-2/FLU/RSV plus assay is intended as an aid in the diagnosis of influenza from Nasopharyngeal swab specimens and should not be used as a sole basis for treatment. Nasal washings and aspirates are unacceptable for Xpert Xpress SARS-CoV-2/FLU/RSV testing.  Fact Sheet for Patients: BloggerCourse.com  Fact Sheet for Healthcare Providers: SeriousBroker.it  This test is not yet approved or cleared by the [place] FDA and has been authorized for detection and/or diagnosis of SARS-CoV-2 by FDA under an Emergency Use Authorization (EUA). This EUA will remain in effect (meaning this test can be used) for the duration of the COVID-19 declaration under Section 564(b)(1) of the Act, 21 U.S.C. section 360bbb-3(b)(1), unless the authorization is terminated or revoked.  Performed at [facility], [address] [place], [place] [address]     Blood Alcohol level:  Lab Results  Component Value Date   [facility] <10 04/08/2021   ETH <10 09/08/2019     Metabolic Disorder Labs:  Lab Results  Component Value Date   HGBA1C 6.0 (H) 04/08/2021   MPG 126 04/08/2021   MPG 122.63 08/19/2019   No results found for: PROLACTIN Lab Results  Component Value Date   CHOL 156 04/08/2021   TRIG 66 04/08/2021   HDL 41 04/08/2021   CHOLHDL 3.8 04/08/2021   VLDL 13 04/08/2021   LDLCALC 102 (H) 04/08/2021    Current Medications: Current Facility-Administered Medications  Medication Dose Route Frequency Provider Last Rate Last Admin   acetaminophen (TYLENOL) tablet 650 mg  650 mg Oral Q6H PRN Clapacs, John T, MD       alum & mag hydroxide-simeth (MAALOX/MYLANTA) 200-200-20 MG/5ML suspension 30 mL  30 mL Oral Q4H PRN Clapacs, John T, MD       aspirin EC tablet 81 mg  81 mg Oral Daily Clapacs, John T, MD   81 mg at 04/10/21 0814   benztropine (COGENTIN) tablet 1 mg  1 mg Oral BID WC Clapacs, Jackquline Denmark, MD   1 mg at 04/10/21 0814   clonazePAM (KLONOPIN) tablet 0.5 mg  0.5 mg Oral BID WC Clapacs, John T, MD   0.5 mg at 04/10/21 4098   folic acid (FOLVITE) tablet 1 mg  1 mg Oral Daily Clapacs, Jackquline Denmark, MD   1 mg at 04/10/21 1191   haloperidol (HALDOL) tablet 15 mg  15 mg Oral BID WC Clapacs, Jackquline Denmark, MD   15 mg at 04/10/21 4782   haloperidol lactate (HALDOL) injection 15 mg  15 mg Intramuscular BID PRN Clapacs, Jackquline Denmark, MD       hydrOXYzine (ATARAX/VISTARIL) tablet 50 mg  50 mg Oral TID PRN Clapacs, Jackquline Denmark, MD       levothyroxine (SYNTHROID) tablet 125 mcg  125 mcg Oral QAC breakfast Clapacs, Jackquline Denmark, MD       lisinopril (ZESTRIL) tablet 2.5 mg  2.5 mg Oral Daily Clapacs, John T, MD   2.5 mg at 04/10/21 0813   magnesium hydroxide (MILK OF MAGNESIA) suspension 30 mL  30 mL Oral Daily PRN Clapacs, Jackquline Denmark, MD       metFORMIN (GLUCOPHAGE) tablet 1,000 mg  1,000 mg Oral BID WC Clapacs, John T, MD   1,000 mg at 04/10/21 0816   OLANZapine (ZYPREXA) injection 20 mg  20 mg Intramuscular BID PRN Clapacs, Jackquline Denmark, MD       OLANZapine zydis (ZYPREXA) disintegrating  tablet 20 mg  20 mg Oral BID WC Clapacs, John T, MD   20 mg at 04/10/21 0835   simvastatin (ZOCOR) tablet 20 mg  20 mg Oral QHS Clapacs, John T, MD       vitamin B-12 (CYANOCOBALAMIN) tablet 1,000 mcg  1,000 mcg Oral Daily Clapacs, John T, MD       PTA Medications: Medications Prior to Admission  Medication Sig Dispense Refill Last Dose   aspirin EC 81 MG tablet Take 81 mg by mouth daily.      benztropine (COGENTIN) 1 MG tablet Take 1 tablet (1 mg total) by mouth 2 (two) times daily with a meal. 60 tablet 2    clonazePAM (KLONOPIN) 0.5 MG tablet Take 1 tablet (0.5 mg total) by mouth 2 (two) times daily with a meal. 60 tablet 0    divalproex (DEPAKOTE SPRINKLE) 125 MG capsule Take 8 capsules (1,000 mg total) by mouth 2 (two) times daily with a meal. 240 capsule 2    folic acid (FOLVITE) 1 MG tablet Take 1 mg by mouth daily.      haloperidol (HALDOL) 10 MG tablet Take 1.5 tablets (15 mg total) by mouth 2 (two) times daily with a meal. 90 tablet 2    levothyroxine (SYNTHROID, LEVOTHROID) 125 MCG tablet Take 1 tablet (125 mcg total) by mouth daily before breakfast. 30 tablet 0    metFORMIN (GLUCOPHAGE) 1000 MG tablet Take 1 tablet (1,000 mg total) by mouth 2 (two) times daily with a meal. 60 tablet 0    OLANZapine zydis (ZYPREXA) 20 MG disintegrating tablet Take 1 tablet (20 mg total) by mouth 2 (two) times daily with a meal. 60 tablet 2    simvastatin (ZOCOR) 20 MG tablet Take 20 mg by mouth at bedtime.      traZODone (DESYREL) 150 MG tablet Take 150 mg by mouth at bedtime.      vitamin B-12 (CYANOCOBALAMIN) 1000 MCG tablet Take 1,000 mcg by mouth daily.       Musculoskeletal: Strength & Muscle Tone: within normal limits Gait & Station: normal Patient leans: N/A            Psychiatric Specialty  Exam:  Presentation  General Appearance: Disheveled  Eye Contact:Fair  Speech:Garbled; Pressured  Speech Volume:Decreased  Handedness:Right   Mood and Affect   Mood:Irritable  Affect:Flat   Thought Process  Thought Processes:Disorganized  Duration of Psychotic Symptoms: Less than six months  Past Diagnosis of Schizophrenia or Psychoactive disorder: Yes  Descriptions of Associations:Tangential  Orientation:Partial  Thought Content:Illogical; Scattered  Hallucinations:Hallucinations: Auditory  Ideas of Reference:Paranoia  Suicidal Thoughts:Suicidal Thoughts: No  Homicidal Thoughts:Homicidal Thoughts: No   Sensorium  Memory:Immediate Poor; Recent Poor; Remote Poor  Judgment:Impaired  Insight:Lacking   Executive Functions  Concentration:Poor  Attention Span:Poor  Recall:Poor  Fund of Knowledge:Poor  Language:Poor   Psychomotor Activity  Psychomotor Activity:Psychomotor Activity: Extrapyramidal Side Effects (EPS) Extrapyramidal Side Effects (EPS): Tardive Dyskinesia   Assets  Assets:Desire for Improvement; Financial Resources/Insurance; Housing; Resilience   Sleep  Sleep:Sleep: Poor Number of Hours of Sleep: 4.15    Physical Exam: Physical Exam Vitals and nursing note reviewed.  Constitutional:      Appearance: Normal appearance.  HENT:     Head: Normocephalic and atraumatic.     Right Ear: External ear normal.     Left Ear: External ear normal.     Nose: Nose normal.     Mouth/Throat:     Mouth: Mucous membranes are moist.     Pharynx: Oropharynx is clear.  Eyes:     Extraocular Movements: Extraocular movements intact.     Conjunctiva/sclera: Conjunctivae normal.     Pupils: Pupils are equal, round, and reactive to light.  Cardiovascular:     Rate and Rhythm: Normal rate.     Pulses: Normal pulses.  Pulmonary:     Effort: Pulmonary effort is normal.     Breath sounds: Normal breath sounds.  Abdominal:     General: Abdomen is flat.     Palpations: Abdomen is soft.  Musculoskeletal:        General: No swelling. Normal range of motion.     Cervical back: Normal range of motion.  Skin:     General: Skin is warm and dry.  Neurological:     General: No focal deficit present.     Mental Status: He is alert.     Cranial Nerves: No cranial nerve deficit.  Psychiatric:        Attention and Perception: He is inattentive. He perceives auditory hallucinations.        Mood and Affect: Affect is flat.        Speech: Speech is rapid and pressured and slurred.        Behavior: Behavior is agitated.        Thought Content: Thought content is paranoid and delusional.        Cognition and Memory: Cognition is impaired. Memory is impaired.        Judgment: Judgment is impulsive.   Review of Systems  Constitutional: Negative.   HENT: Negative.    Eyes: Negative.   Respiratory: Negative.    Cardiovascular: Negative.   Gastrointestinal: Negative.   Genitourinary: Negative.   Musculoskeletal: Negative.   Skin: Negative.   Neurological: Negative.   Endo/Heme/Allergies:  Positive for environmental allergies. Does not bruise/bleed easily.  Psychiatric/Behavioral:  Positive for hallucinations and memory loss. The patient has insomnia.   Blood pressure (!) 148/84, pulse 90, temperature 98 F (36.7 C), temperature source Oral, resp. rate 18, height 6\' 3"  (1.905 m), weight 101 kg, SpO2 99 %. Body mass index is 27.83 kg/m.  Treatment Plan Summary: Daily contact with  patient to assess and evaluate symptoms and progress in treatment and Medication management 65 year old male presenting with psychosis and increased aggression in the context of medication noncompliance.  Schizophrenia - Resume Haldol 15 mg BID, Zyprexa 20 mg BID, cogentin 1 mg BID - He has previously failed monotherapy with Haldol, Zyprexa, Risperdal, and Prolixin  History of seizures/Anxiety - Continue klonopin 0.5 mg BID  Hypothyroidism - Synthroid 125 mcg daily   HTN - Lisinopril 2.5 mg daily   Diabetes Mellitus, Type 2 - Metformin 1000 mg BID   HLD- well controlled - Simvastatin 20 mg QHS, lipid panel 04/08/21 WNL  aside from LDL minutely increased at 470.   Vitamin B12 deficiency - Cyanocobalamin 1000 mcg daily   Observation Level/Precautions:  15 minute checks  Laboratory:   Completed in ED  Psychotherapy:    Medications:    Consultations:    Discharge Concerns:    Estimated LOS:  Other:     Physician Treatment Plan for Primary Diagnosis: Schizophrenia (HCC) Long Term Goal(s): Improvement in symptoms so as ready for discharge  Short Term Goals: Ability to identify changes in lifestyle to reduce recurrence of condition will improve, Ability to verbalize feelings will improve, Ability to disclose and discuss suicidal ideas, Ability to demonstrate self-control will improve, Ability to identify and develop effective coping behaviors will improve, Ability to maintain clinical measurements within normal limits will improve, Compliance with prescribed medications will improve, and Ability to identify triggers associated with substance abuse/mental health issues will improve  Physician Treatment Plan for Secondary Diagnosis: Principal Problem:   Schizophrenia (HCC) Active Problems:   Diabetes (HCC)   Hypothyroid   Hyperlipidemia  Long Term Goal(s): Improvement in symptoms so as ready for discharge  Short Term Goals: Ability to identify changes in lifestyle to reduce recurrence of condition will improve, Ability to verbalize feelings will improve, Ability to disclose and discuss suicidal ideas, Ability to demonstrate self-control will improve, Ability to identify and develop effective coping behaviors will improve, Ability to maintain clinical measurements within normal limits will improve, Compliance with prescribed medications will improve, and Ability to identify triggers associated with substance abuse/mental health issues will improve  I certify that inpatient services furnished can reasonably be expected to improve the patient's condition.    Jesse Sans, MD 10/4/202210:33 AM

## 2021-04-10 NOTE — [facility] Counselor (Cosign Needed)
Attempted to reach out to pt guardian. Message said to please contact supervisor Ronaldo Miyamoto Marrow after a specific date in 2021, the number [phone]. CSW left a HIPPA compliant voicemail.    Rosezella Florida MSW Intern 04/10/2021 11:51am

## 2021-04-10 NOTE — Progress Notes (Signed)
Pt was given cloth scrubs, towels and products and encouraged to take a shower.  Torrie Mayers RN

## 2021-04-10 NOTE — [facility] Suicide Risk Assessment (Signed)
[facility] Admission Suicide Risk Assessment   Nursing information obtained from:  Patient Demographic factors:  NA Current Mental Status:  NA Loss Factors:  NA Historical Factors:  NA Risk Reduction Factors:  NA  Total Time spent with patient: 35 minutes- 25 minutes face-to-face contact with patient, 10 minutes documentation, coordination of care, scripts  Principal Problem: Schizophrenia (HCC) Diagnosis:  Principal Problem:   Schizophrenia (HCC) Active Problems:   Diabetes (HCC)   Hypothyroid   Hyperlipidemia   HTN (hypertension)  Subjective Data:  65 year old male with schizophrenia brought to emergency room by group home for increasing agitation and medication non-compliance. Patient seen one-on-one today. His thought process is extremely disorganized, and speech is slurred. Most of his speech was unintelligible, but words that provider could understand were hyperreligous in nature. He states "I have the right of self persecution," "My God will save me." He is heard saying Jesus and God numerous times as well. He appears to be responding to internal stimuli at time of interview. However, patient not aggressive during interview. Per chart review, he threw objects in the group home. With significant prompting from staff, he did eat breakfast and take his medications.   Continued Clinical Symptoms:  Alcohol Use Disorder Identification Test Final Score (AUDIT): 0 The "Alcohol Use Disorders Identification Test", Guidelines for Use in Primary Care, Second Edition.  World Science writer [facility]). Score between 0-7:  no or low risk or alcohol related problems. Score between 8-15:  moderate risk of alcohol related problems. Score between 16-19:  high risk of alcohol related problems. Score 20 or above:  warrants further diagnostic evaluation for alcohol dependence and treatment.   CLINICAL FACTORS:   Schizophrenia:   Paranoid or undifferentiated type Currently Psychotic Unstable or Poor  Therapeutic Relationship Previous Psychiatric Diagnoses and Treatments Medical Diagnoses and Treatments/Surgeries   Musculoskeletal: Strength & Muscle Tone: within normal limits Gait & Station: normal Patient leans: N/A  Psychiatric Specialty Exam:  Presentation  General Appearance: Disheveled  Eye Contact:Fair  Speech:Garbled; Pressured  Speech Volume:Decreased  Handedness:Right   Mood and Affect  Mood:Irritable  Affect:Flat   Thought Process  Thought Processes:Disorganized  Descriptions of Associations:Tangential  Orientation:Partial  Thought Content:Illogical; Scattered  History of Schizophrenia/Schizoaffective disorder:Yes  Duration of Psychotic Symptoms:Less than six months  Hallucinations:Hallucinations: Auditory  Ideas of Reference:Paranoia  Suicidal Thoughts:Suicidal Thoughts: No  Homicidal Thoughts:Homicidal Thoughts: No   Sensorium  Memory:Immediate Poor; Recent Poor; Remote Poor  Judgment:Impaired  Insight:Lacking   Executive Functions  Concentration:Poor  Attention Span:Poor  Recall:Poor  Fund of Knowledge:Poor  Language:Poor   Psychomotor Activity  Psychomotor Activity:Psychomotor Activity: Extrapyramidal Side Effects (EPS) Extrapyramidal Side Effects (EPS): Tardive Dyskinesia   Assets  Assets:Desire for Improvement; Financial Resources/Insurance; Housing; Resilience   Sleep  Sleep:Sleep: Poor Number of Hours of Sleep: 4.15    Physical Exam: Physical Exam ROS Blood pressure (!) 148/84, pulse 90, temperature 98 F (36.7 C), temperature source Oral, resp. rate 18, height 6\' 3"  (1.905 m), weight 101 kg, SpO2 99 %. Body mass index is 27.83 kg/m.   COGNITIVE FEATURES THAT CONTRIBUTE TO RISK:  Closed-mindedness and Loss of executive function    SUICIDE RISK:   Mild:  Suicidal ideation of limited frequency, intensity, duration, and specificity.  There are no identifiable plans, no associated intent, mild  dysphoria and related symptoms, good self-control (both objective and subjective assessment), few other risk factors, and identifiable protective factors, including available and accessible social support.  PLAN OF CARE: Continue inpatient admission, see H&P for details.  I certify that inpatient services furnished can reasonably be expected to improve the patient's condition.   Jesse Sans, MD 04/10/2021, 10:55 AM

## 2021-04-11 MED ORDER — TRAZODONE HCL 50 MG PO TABS
150.0000 mg | ORAL_TABLET | Freq: Every day | ORAL | Status: DC
  Administered 2021-04-11 – 2021-04-19 (×8): 150 mg via ORAL
  Filled 2021-04-11 (×8): qty 1

## 2021-04-11 NOTE — Progress Notes (Signed)
Recreation Therapy Notes  INPATIENT RECREATION TR PLAN  Patient Details Name: Tab Rylee MRN: [id number] DOB: 18-May-1956 Today's Date: 04/11/2021  Rec Therapy Plan Is patient appropriate for Therapeutic Recreation?: Yes Treatment times per week: at least 3 Estimated Length of Stay: 5-7 days TR Treatment/Interventions: Group participation (Comment)  Discharge Criteria Pt will be discharged from therapy if:: Discharged Treatment plan/goals/alternatives discussed and agreed upon by:: Patient/family  Discharge Summary     Rosenda Geffrard 04/11/2021, 2:34 PM

## 2021-04-11 NOTE — Progress Notes (Signed)
Recreation Therapy Notes  INPATIENT RECREATION THERAPY ASSESSMENT  Patient Details Name: [name] MRN: [id number] DOB: Jan 04, 1956 Today's Date: 04/11/2021       Information Obtained From: Patient  Able to Participate in Assessment/Interview: Yes  Patient Presentation: Responsive  Reason for Admission (Per Patient): Patient Unable to Identify  Patient Stressors:    Coping Skills:   Sports, Doctor, hospital, Read  Leisure Interests (2+):  Sports - Basketball, Music - Listen  Frequency of Recreation/Participation: Weekly  Awareness of Community Resources:  Yes  Community Resources:  Other (Comment) (My check of $800)  Current Use: Yes  If no, Barriers?:    Expressed Interest in [employer] Information: Yes  County of Residence:  N/A  Patient Main Form of Transportation: Other (Comment) (Group home)  Patient Strengths:  N/A  Patient Identified Areas of Improvement:  All things  Patient Goal for Hospitalization:  I do not even know why I am here.  Current SI (including self-harm):  No  Current HI:  No  Current AVH: No  Staff Intervention Plan: Group Attendance, Collaborate with Interdisciplinary Treatment Team  Consent to Intern Participation: N/A  [name] 04/11/2021, 2:33 PM

## 2021-04-11 NOTE — Progress Notes (Signed)
Patient is observed to be pacing the halls with a pen in his mouth. Patient does not respond when staff tell patient to take the pen out of his mouth.

## 2021-04-11 NOTE — Progress Notes (Signed)
[facility] MD Progress Note  04/11/2021 10:05 AM [name]  MRN:  [id number]  CC "You're not my Jesus."  Subjective:   65 year old male with schizophrenia brought to emergency room by group home for increasing agitation and medication non-compliance. No acute events overnight, though patient did refuse his medications. This morning patient took medications, however refused vital signs and lisinopril was held. ADLs impaired. This morning patient states that this provider is not his Jesus. After this, refuses to answer all questions. He is mumbling to himself and appears internally preoccupied. His speech is slurred and at a soft volume. He is currently withdrawn to room. No episodes of aggression or violence.   Principal Problem: Schizophrenia (HCC) Diagnosis: Principal Problem:   Schizophrenia (HCC) Active Problems:   Diabetes (HCC)   Hypothyroid   Hyperlipidemia   HTN (hypertension)  Total Time spent with patient: 30 minutes  Past Psychiatric History: See H&P  Past Medical History:  Past Medical History:  Diagnosis Date   Diabetes mellitus without complication (HCC)    HTN (hypertension)    Hyperammonemia (HCC) While on Depakote   Hyperlipemia    Lithium toxicity    Neutropenia (HCC)    Schizophrenia (HCC)    Seizure (HCC) While toxic on Depakote   Sickle cell trait (HCC)    Thyroid disease    TIA (transient ischemic attack) in 2009   History reviewed. No pertinent surgical history. Family History: History reviewed. No pertinent family history. Family Psychiatric  History: See H&P Social History:  Social History   Substance and Sexual Activity  Alcohol Use No     Social History   Substance and Sexual Activity  Drug Use Not Currently    Social History   Socioeconomic History   Marital status: Single    Spouse name: Not on file   Number of children: Not on file   Years of education: Not on file   Highest education level: Not on file  Occupational History   Not on  file  Tobacco Use   Smoking status: Never   Smokeless tobacco: Never  Vaping Use   Vaping Use: Never used  Substance and Sexual Activity   Alcohol use: No   Drug use: Not Currently   Sexual activity: Not Currently  Other Topics Concern   Not on file  Social History Narrative   Not on file   Social Determinants of Health   Financial Resource Strain: Not on file  Food Insecurity: Not on file  Transportation Needs: Not on file  Physical Activity: Not on file  Stress: Not on file  Social Connections: Not on file   Additional Social History:                         Sleep: Poor  Appetite:  Fair  Current Medications: Current Facility-Administered Medications  Medication Dose Route Frequency Provider Last Rate Last Admin   acetaminophen (TYLENOL) tablet 650 mg  650 mg Oral Q6H PRN Clapacs, John T, MD       alum & mag hydroxide-simeth (MAALOX/MYLANTA) 200-200-20 MG/5ML suspension 30 mL  30 mL Oral Q4H PRN Clapacs, John T, MD       aspirin EC tablet 81 mg  81 mg Oral Daily Clapacs, John T, MD   81 mg at 04/11/21 0729   benztropine (COGENTIN) tablet 1 mg  1 mg Oral BID WC Clapacs, Jackquline Denmark, MD   1 mg at 04/11/21 0729   clonazePAM (KLONOPIN) tablet 0.5  mg  0.5 mg Oral BID WC Clapacs, John T, MD   0.5 mg at 04/11/21 8527   folic acid (FOLVITE) tablet 1 mg  1 mg Oral Daily Clapacs, Jackquline Denmark, MD   1 mg at 04/11/21 7824   haloperidol (HALDOL) tablet 15 mg  15 mg Oral BID WC Clapacs, Jackquline Denmark, MD   15 mg at 04/11/21 2353   haloperidol lactate (HALDOL) injection 15 mg  15 mg Intramuscular BID PRN Clapacs, Jackquline Denmark, MD       hydrOXYzine (ATARAX/VISTARIL) tablet 50 mg  50 mg Oral TID PRN Clapacs, Jackquline Denmark, MD       levothyroxine (SYNTHROID) tablet 125 mcg  125 mcg Oral QAC breakfast Clapacs, Jackquline Denmark, MD       lisinopril (ZESTRIL) tablet 2.5 mg  2.5 mg Oral Daily Clapacs, John T, MD   2.5 mg at 04/10/21 0813   magnesium hydroxide (MILK OF MAGNESIA) suspension 30 mL  30 mL Oral Daily PRN  Clapacs, Jackquline Denmark, MD       metFORMIN (GLUCOPHAGE) tablet 1,000 mg  1,000 mg Oral BID WC Clapacs, John T, MD   1,000 mg at 04/11/21 0729   OLANZapine (ZYPREXA) injection 20 mg  20 mg Intramuscular BID PRN Clapacs, Jackquline Denmark, MD       OLANZapine zydis (ZYPREXA) disintegrating tablet 20 mg  20 mg Oral BID WC Clapacs, John T, MD   20 mg at 04/11/21 6144   simvastatin (ZOCOR) tablet 20 mg  20 mg Oral QHS Clapacs, John T, MD       vitamin B-12 (CYANOCOBALAMIN) tablet 1,000 mcg  1,000 mcg Oral Daily Clapacs, John T, MD   1,000 mcg at 04/11/21 0730    Lab Results: No results found for this or any previous visit (from the past 48 hour(s)).  Blood Alcohol level:  Lab Results  Component Value Date   ETH <10 04/08/2021   ETH <10 09/08/2019    Metabolic Disorder Labs: Lab Results  Component Value Date   HGBA1C 6.0 (H) 04/08/2021   MPG 126 04/08/2021   MPG 122.63 08/19/2019   No results found for: PROLACTIN Lab Results  Component Value Date   CHOL 156 04/08/2021   TRIG 66 04/08/2021   HDL 41 04/08/2021   CHOLHDL 3.8 04/08/2021   VLDL 13 04/08/2021   LDLCALC 102 (H) 04/08/2021    Physical Findings: AIMS:  , ,  ,  ,    CIWA:    COWS:     Musculoskeletal: Strength & Muscle Tone: within normal limits Gait & Station: normal Patient leans: N/A  Psychiatric Specialty Exam:  Presentation  General Appearance: Disheveled  Eye Contact:Fair  Speech:Garbled; Pressured  Speech Volume:Decreased  Handedness:Right   Mood and Affect  Mood:Irritable  Affect:Flat   Thought Process  Thought Processes:Disorganized  Descriptions of Associations:Tangential  Orientation:Partial  Thought Content:Illogical; Scattered  History of Schizophrenia/Schizoaffective disorder:Yes  Duration of Psychotic Symptoms:Less than six months  Hallucinations:Hallucinations: Auditory  Ideas of Reference:Paranoia  Suicidal Thoughts:Suicidal Thoughts: No  Homicidal Thoughts:Homicidal Thoughts:  No   Sensorium  Memory:Immediate Poor; Recent Poor; Remote Poor  Judgment:Impaired  Insight:Lacking   Executive Functions  Concentration:Poor  Attention Span:Poor  Recall:Poor  Fund of Knowledge:Poor  Language:Poor   Psychomotor Activity  Psychomotor Activity:Psychomotor Activity: Extrapyramidal Side Effects (EPS) Extrapyramidal Side Effects (EPS): Tardive Dyskinesia   Assets  Assets:Desire for Improvement; Financial Resources/Insurance; Housing; Resilience   Sleep  Sleep:Sleep: Poor Number of Hours of Sleep: 4.15    Physical Exam:  Physical Exam ROS Blood pressure (!) 148/84, pulse 90, temperature 98 F (36.7 C), temperature source Oral, resp. rate 18, height 6\' 3"  (1.905 m), weight 101 kg, SpO2 99 %. Body mass index is 27.83 kg/m.   Treatment Plan Summary: Daily contact with patient to assess and evaluate symptoms and progress in treatment and Medication management 65 year old male presenting with psychosis and increased aggression in the context of medication noncompliance. Currently taking medications intermittently. Continues to be highly psychotic on exam today, however, is eating his meals.    Schizophrenia - Continue Haldol 15 mg BID, Zyprexa 20 mg BID, cogentin 1 mg BID - He has previously failed monotherapy with Haldol, Zyprexa, Risperdal, and Prolixin   History of seizures/Anxiety - Continue klonopin 0.5 mg BID   Hypothyroidism - Synthroid 125 mcg daily    HTN - Lisinopril 2.5 mg daily    Diabetes Mellitus, Type 2 - Metformin 1000 mg BID    HLD- well controlled - Simvastatin 20 mg QHS, lipid panel 04/08/21 WNL aside from LDL minutely increased at 06/08/21.    Vitamin B12 deficiency - Cyanocobalamin 1000 mcg daily   919, MD 04/11/2021, 10:05 AM

## 2021-04-11 NOTE — [facility] Suicide Risk Assessment (Signed)
[facility] INPATIENT:  Family/Significant Other Suicide Prevention Education  Suicide Prevention Education:  Education Completed; Ilda Basset [facility], [phone] has been identified by the patient as the family member/significant other with whom the patient will be residing, and identified as the person(s) who will aid the patient in the event of a mental health crisis (suicidal ideations/suicide attempt).  With written consent from the patient, the family member/significant other has been provided the following suicide prevention education, prior to the and/or following the discharge of the patient.  The suicide prevention education provided includes the following: Suicide risk factors Suicide prevention and interventions National Suicide Hotline telephone number [facility] assessment telephone number [facility] Emergency Assistance 911 [facility] and/or Residential Mobile Crisis Unit telephone number  Request made of family/significant other to: Remove weapons (e.g., guns, rifles, knives), all items previously/currently identified as safety concern.   Remove drugs/medications (over-the-counter, prescriptions, illicit drugs), all items previously/currently identified as a safety concern.  The family member/significant other verbalizes understanding of the suicide prevention education information provided.  The family member/significant other agrees to remove the items of safety concern listed above.  SPE completed with the patient's guardian representative face to face.  They completed the PSA to the best of thor ability.  They report that patient CAN return to home.  They reports that the patient is at baseline at this time. They report that it is unusual that patient did not take his medications.    [name] 04/11/2021, 4:08 PM

## 2021-04-11 NOTE — Progress Notes (Signed)
Recreation Therapy Notes  Date: 04/11/2021  Time: 10:00 am   Location: Courtyard  Behavioral response: Appropriate,Isolated   Intervention Topic: Social Skills  Discussion/Intervention:  Group content on today was focused on Pharmacist, community. The group defined social skills and identified ways they use social skills. Patients expressed what obstacles they face when trying to be social. Participants described the importance of social skills. The group listed ways to improve social skills and reasons to improve social skills. Individuals had an opportunity to learn new and improve social skills as well as identify their weaknesses. Clinical Observations/Feedback: Patient came to group and was isolated from staff and peers. Other peers attempted to be social with individual but individual did not respond.  Maudine Kluesner LRT/CTRS         Jenel Gierke 04/11/2021 11:46 AM

## 2021-04-11 NOTE — Group Note (Signed)
[facility] LCSW Group Therapy Note   Group Date: 04/11/2021 Start Time: 1300 End Time: 1400   Type of Therapy/Topic:  Group Therapy:  Emotion Regulation  Participation Level:  Did Not Attend   Mood:  Description of Group:    The purpose of this group is to assist patients in learning to regulate negative emotions and experience positive emotions. Patients will be guided to discuss ways in which they have been vulnerable to their negative emotions. These vulnerabilities will be juxtaposed with experiences of positive emotions or situations, and patients challenged to use positive emotions to combat negative ones. Special emphasis will be placed on coping with negative emotions in conflict situations, and patients will process healthy conflict resolution skills.  Therapeutic Goals: Patient will identify two positive emotions or experiences to reflect on in order to balance out negative emotions:  Patient will label two or more emotions that they find the most difficult to experience:  Patient will be able to demonstrate positive conflict resolution skills through discussion or role plays:   Summary of Patient Progress:   X    Therapeutic Modalities:   Cognitive Behavioral Therapy Feelings Identification Dialectical Behavioral Therapy   [name], LCSW

## 2021-04-11 NOTE — Progress Notes (Signed)
D: Patient appears as restless and disheveled. Pt is difficult to understand and seems to be confused. When asked his name, DOB, where he is etc., pt does not respond.   A: Administered medications as per MAR orders. Encourage pt to come to staff with any concerns.  R: Pt is calm, medication compliant, and remains safe on the unit at this time.    04/11/21 2124  Psych Admission Type (Psych Patients Only)  Admission Status Involuntary  Psychosocial Assessment  Patient Complaints None  Eye Contact Fair  Facial Expression Blank  Affect Preoccupied  Speech Logical/coherent  Interaction Forwards little;Guarded  Motor Activity Restless  Appearance/Hygiene In scrubs  Behavior Characteristics Calm;Restless  Mood Anxious  Thought Process  Coherency Disorganized  Content Preoccupation  Delusions Religious  Perception Derealization  Hallucination None reported or observed  Judgment Poor  Confusion Moderate  Danger to Self  Current suicidal ideation? Denies  Danger to Others  Danger to Others None reported or observed

## 2021-04-11 NOTE — [facility] Counselor (Signed)
Adult Comprehensive Assessment  Patient ID: [name], male   DOB: 1955-07-20, 65 y.o.   MRN: [id number]  Information Source: Information source: Patient (Assessment also completed with the Valetta Mole and Jessica with [facility] Department of Social Services, representative of the patients guardian [facility] DSS).)  Current Stressors:  Patient states their primary concerns and needs for treatment are:: Per DSS: "breaking into cupboards and threw something"  Per pt: "she wasn't feeding me". Patient states their goals for this hospitilization and ongoing recovery are:: Per DSS: "stabillizing, theres a belief that he was not on his meds".  Pt did not provide an answer. Educational / Learning stressors: Unable to assess. Employment / Job issues: Unable to assess. Family Relationships: Per DSS: "there's limited contact with family" Financial / Lack of resources (include bankruptcy): Per pt "she is not giving me my money" Housing / Lack of housing: Unable to assess. Physical health (include injuries & life threatening diseases): Per DSS "he has a seizure disorder and diabetes" Social relationships: Unable to assess. Substance abuse: Unable to assess. Bereavement / Loss: Unable to assess.  Living/Environment/Situation:  Living Arrangements: Non-relatives/Friends, Other (Comment) (Family Care Home) Who else lives in the home?: Other residents How long has patient lived in current situation?: Per DSS "over a year"  Family History:  Marital status:  (Unable to assess.)  Childhood History:  By whom was/is the patient raised?:  (Unable to assess.)  Education:  Highest grade of school patient has completed: Unable to assess.  Employment/Work Situation:   Employment Situation: On disability Why is Patient on Disability: Unable to assess. How Long has Patient Been on Disability: Unable to assess. Has Patient ever Been in the [employer]?:  (Unable to assess.)  Financial Resources:    Financial resources: [employer], [employer] (Special Assistance Medicaid) Does patient have a representative payee or guardian?: Yes Name of representative payee or guardian: [facility], Bruce [place], [phone]  Alcohol/Substance Abuse:   What has been your use of drugs/alcohol within the last 12 months?: Unable to assess.  Social Support System:   Patient's Community Support System: Fair Museum/gallery exhibitions officer System: Per DSS: "he has [place]" Type of faith/religion: Pt appears to be hyper-religious at this time.  Leisure/Recreation:   Do You Have Hobbies?: Yes Leisure and Hobbies: Per DSS "basketball"  Strengths/Needs:   What is the patient's perception of their strengths?: Unable to assess. Patient states they can use these personal strengths during their treatment to contribute to their recovery: Unable to assess. Patient states these barriers may affect/interfere with their treatment: Unable to assess. Patient states these barriers may affect their return to the community: Unable to assess.  Discharge Plan:   Currently receiving community mental health services: Yes (From Whom) (Medication provider is provided through the group home.) Patient states concerns and preferences for aftercare planning are: [facility] DSS reports that they would like to continue with what the family care home has established. Patient states they will know when they are safe and ready for discharge when: Unable to assess. Does patient have access to transportation?: No Does patient have financial barriers related to discharge medications?: No Plan for no access to transportation at discharge: CSW will assist with transportation needs. Will patient be returning to same living situation after discharge?: Yes  Summary/Recommendations:   Summary and Recommendations (to be completed by the evaluator): Patient is a 65 year old male [facility] [place] [place] [facility]  He has a guardian  with [facility], Bari Edward  is the representative.  He presents to the hospital following concerns that patient has stopped taking his medication and become aggressive in the home towards [facility] staff.  Patient indicates that his trigger was not being able to eat when he wants to and a belief that the group home is taking his money.  Patient appeared to struggle with the concept that he doesn't get his money directly because it pays for room and board.  Recommendations include: crisis stabilization, therapeutic milieu, encourage group attendance and participation, medication management for mood stabilization and development of comprehensive mental wellness/sobriety plan.  [name]. 04/11/2021

## 2021-04-11 NOTE — Progress Notes (Signed)
Patient does not answer assessment questions, but is observed mumbling to himself. He is responding to internal stimuli and is hyperreligious. Patient thoughts are disorganized and speech is slurred.Patient was compliant with 8am scheduled medications. However, he refused to let this writer take his blood pressure, so lisinopril was held. Patient was given support and encouragement Patient remains safe on the unit at this time.

## 2021-04-11 NOTE — BH IP Treatment Plan (Signed)
Interdisciplinary Treatment and Diagnostic Plan Update  04/11/2021 Time of Session: 9:00AM [name] MRN: [id number]  Principal Diagnosis: Schizophrenia [facility])  Secondary Diagnoses: Principal Problem:   Schizophrenia (HCC) Active Problems:   Diabetes (HCC)   Hypothyroid   Hyperlipidemia   HTN (hypertension)   Current Medications:  Current Facility-Administered Medications  Medication Dose Route Frequency Provider Last Rate Last Admin   acetaminophen (TYLENOL) tablet 650 mg  650 mg Oral Q6H PRN Clapacs, Jackquline Denmark, MD       alum & mag hydroxide-simeth (MAALOX/MYLANTA) 200-200-20 MG/5ML suspension 30 mL  30 mL Oral Q4H PRN Clapacs, Jackquline Denmark, MD       aspirin EC tablet 81 mg  81 mg Oral Daily Clapacs, Jackquline Denmark, MD   81 mg at 04/11/21 0729   benztropine (COGENTIN) tablet 1 mg  1 mg Oral BID WC Clapacs, Jackquline Denmark, MD   1 mg at 04/11/21 0093   clonazePAM (KLONOPIN) tablet 0.5 mg  0.5 mg Oral BID WC Clapacs, John T, MD   0.5 mg at 04/11/21 8182   folic acid (FOLVITE) tablet 1 mg  1 mg Oral Daily Clapacs, Jackquline Denmark, MD   1 mg at 04/11/21 9937   haloperidol (HALDOL) tablet 15 mg  15 mg Oral BID WC Clapacs, Jackquline Denmark, MD   15 mg at 04/11/21 1696   haloperidol lactate (HALDOL) injection 15 mg  15 mg Intramuscular BID PRN Clapacs, Jackquline Denmark, MD       hydrOXYzine (ATARAX/VISTARIL) tablet 50 mg  50 mg Oral TID PRN Clapacs, Jackquline Denmark, MD       levothyroxine (SYNTHROID) tablet 125 mcg  125 mcg Oral QAC breakfast Clapacs, John T, MD       lisinopril (ZESTRIL) tablet 2.5 mg  2.5 mg Oral Daily Clapacs, John T, MD   2.5 mg at 04/10/21 0813   magnesium hydroxide (MILK OF MAGNESIA) suspension 30 mL  30 mL Oral Daily PRN Clapacs, Jackquline Denmark, MD       metFORMIN (GLUCOPHAGE) tablet 1,000 mg  1,000 mg Oral BID WC Clapacs, John T, MD   1,000 mg at 04/11/21 0729   OLANZapine (ZYPREXA) injection 20 mg  20 mg Intramuscular BID PRN Clapacs, Jackquline Denmark, MD       OLANZapine zydis (ZYPREXA) disintegrating tablet 20 mg  20 mg Oral BID WC Clapacs,  John T, MD   20 mg at 04/11/21 7893   simvastatin (ZOCOR) tablet 20 mg  20 mg Oral QHS Clapacs, John T, MD       vitamin B-12 (CYANOCOBALAMIN) tablet 1,000 mcg  1,000 mcg Oral Daily Clapacs, John T, MD   1,000 mcg at 04/11/21 0730   PTA Medications: Medications Prior to Admission  Medication Sig Dispense Refill Last Dose   aspirin EC 81 MG tablet Take 81 mg by mouth daily.      benztropine (COGENTIN) 1 MG tablet Take 1 tablet (1 mg total) by mouth 2 (two) times daily with a meal. 60 tablet 2    clonazePAM (KLONOPIN) 0.5 MG tablet Take 1 tablet (0.5 mg total) by mouth 2 (two) times daily with a meal. 60 tablet 0    divalproex (DEPAKOTE SPRINKLE) 125 MG capsule Take 8 capsules (1,000 mg total) by mouth 2 (two) times daily with a meal. 240 capsule 2    folic acid (FOLVITE) 1 MG tablet Take 1 mg by mouth daily.      haloperidol (HALDOL) 10 MG tablet Take 1.5 tablets (15 mg total) by mouth 2 (  two) times daily with a meal. 90 tablet 2    levothyroxine (SYNTHROID, LEVOTHROID) 125 MCG tablet Take 1 tablet (125 mcg total) by mouth daily before breakfast. 30 tablet 0    metFORMIN (GLUCOPHAGE) 1000 MG tablet Take 1 tablet (1,000 mg total) by mouth 2 (two) times daily with a meal. 60 tablet 0    OLANZapine zydis (ZYPREXA) 20 MG disintegrating tablet Take 1 tablet (20 mg total) by mouth 2 (two) times daily with a meal. 60 tablet 2    simvastatin (ZOCOR) 20 MG tablet Take 20 mg by mouth at bedtime.      traZODone (DESYREL) 150 MG tablet Take 150 mg by mouth at bedtime.      vitamin B-12 (CYANOCOBALAMIN) 1000 MCG tablet Take 1,000 mcg by mouth daily.       Patient Stressors: Marital or family conflict   Medication change or noncompliance    Patient Strengths: Religious Affiliation  Supportive family/friends   Treatment Modalities: Medication Management, Group therapy, Case management,  1 to 1 session with clinician, Psychoeducation, Recreational therapy.   Physician Treatment Plan for Primary  Diagnosis: Schizophrenia (HCC) Long Term Goal(s): Improvement in symptoms so as ready for discharge   Short Term Goals: Ability to identify changes in lifestyle to reduce recurrence of condition will improve Ability to verbalize feelings will improve Ability to disclose and discuss suicidal ideas Ability to demonstrate self-control will improve Ability to identify and develop effective coping behaviors will improve Ability to maintain clinical measurements within normal limits will improve Compliance with prescribed medications will improve Ability to identify triggers associated with substance abuse/mental health issues will improve  Medication Management: Evaluate patient's response, side effects, and tolerance of medication regimen.  Therapeutic Interventions: 1 to 1 sessions, Unit Group sessions and Medication administration.  Evaluation of Outcomes: Not Progressing  Physician Treatment Plan for Secondary Diagnosis: Principal Problem:   Schizophrenia (HCC) Active Problems:   Diabetes (HCC)   Hypothyroid   Hyperlipidemia   HTN (hypertension)  Long Term Goal(s): Improvement in symptoms so as ready for discharge   Short Term Goals: Ability to identify changes in lifestyle to reduce recurrence of condition will improve Ability to verbalize feelings will improve Ability to disclose and discuss suicidal ideas Ability to demonstrate self-control will improve Ability to identify and develop effective coping behaviors will improve Ability to maintain clinical measurements within normal limits will improve Compliance with prescribed medications will improve Ability to identify triggers associated with substance abuse/mental health issues will improve     Medication Management: Evaluate patient's response, side effects, and tolerance of medication regimen.  Therapeutic Interventions: 1 to 1 sessions, Unit Group sessions and Medication administration.  Evaluation of Outcomes: Not  Progressing   RN Treatment Plan for Primary Diagnosis: Schizophrenia (HCC) Long Term Goal(s): Knowledge of disease and therapeutic regimen to maintain health will improve  Short Term Goals: Ability to remain free from injury will improve, Ability to verbalize frustration and anger appropriately will improve, Ability to demonstrate self-control, Ability to participate in decision making will improve, Ability to verbalize feelings will improve, Ability to identify and develop effective coping behaviors will improve, and Compliance with prescribed medications will improve  Medication Management: RN will administer medications as ordered by provider, will assess and evaluate patient's response and provide education to patient for prescribed medication. RN will report any adverse and/or side effects to prescribing provider.  Therapeutic Interventions: 1 on 1 counseling sessions, Psychoeducation, Medication administration, Evaluate responses to treatment, Monitor vital signs and CBGs  as ordered, Perform/monitor CIWA, COWS, AIMS and Fall Risk screenings as ordered, Perform wound care treatments as ordered.  Evaluation of Outcomes: Not Progressing   LCSW Treatment Plan for Primary Diagnosis: Schizophrenia (HCC) Long Term Goal(s): Safe transition to appropriate next level of care at discharge, Engage patient in therapeutic group addressing interpersonal concerns.  Short Term Goals: Engage patient in aftercare planning with referrals and resources, Increase social support, Increase ability to appropriately verbalize feelings, Increase emotional regulation, Facilitate acceptance of mental health diagnosis and concerns, Identify triggers associated with mental health/substance abuse issues, and Increase skills for wellness and recovery  Therapeutic Interventions: Assess for all discharge needs, 1 to 1 time with Social worker, Explore available resources and support systems, Assess for adequacy in community  support network, Educate family and significant other(s) on suicide prevention, Complete Psychosocial Assessment, Interpersonal group therapy.  Evaluation of Outcomes: Not Progressing   Progress in Treatment: Attending groups: No. Participating in groups: No. Taking medication as prescribed: No. Toleration medication: Yes. Family/Significant other contact made: No, will contact:  when given permission Patient understands diagnosis: No. Discussing patient identified problems/goals with staff: No. Medical problems stabilized or resolved: Yes. Denies suicidal/homicidal ideation: Yes. Issues/concerns per patient self-inventory: No. Other: None  New problem(s) identified: No, Describe:  None  New Short Term/Long Term Goal(s): elimination of symptoms of psychosis, medication management for mood stabilization; development of comprehensive mental wellness plan.   Patient Goals:  Patient was unable to participate in treatment team due to his psychosis and has remained mainly non-verbal in his interactions with staff  Discharge Plan or Barriers: CSW will assist pt with development of appropriate discharge/aftercare plan  Reason for Continuation of Hospitalization: Delusions  Medication stabilization  Estimated Length of Stay: 1- 7 days   Scribe for Treatment Team: Sherre Wooton A [place], LCSWA 04/11/2021 9:22 AM

## 2021-04-12 LAB — THYROID PANEL WITH TSH
Free Thyroxine Index: 1.9 (ref 1.2–4.9)
T3 Uptake Ratio: 28 % (ref 24–39)
T4, Total: 6.7 ug/dL (ref 4.5–12.0)
TSH: 1.76 u[IU]/mL (ref 0.450–4.500)

## 2021-04-12 MED ORDER — LEVOTHYROXINE SODIUM 125 MCG PO TABS
125.0000 ug | ORAL_TABLET | Freq: Every day | ORAL | Status: DC
  Administered 2021-04-15 – 2021-04-20 (×5): 125 ug via ORAL
  Filled 2021-04-12 (×8): qty 1

## 2021-04-12 MED ORDER — HALOPERIDOL DECANOATE 100 MG/ML IM SOLN
200.0000 mg | INTRAMUSCULAR | Status: DC
  Filled 2021-04-12: qty 2

## 2021-04-12 NOTE — Progress Notes (Signed)
[facility] MD Progress Note  04/12/2021 9:52 AM [name]  MRN:  [id number]  CC Follow-up for patient with schizophrenia  Subjective:   65 year old male with schizophrenia brought to emergency room by group home for increasing agitation and medication non-compliance. No acute events overnight. Patient took synthroid this morning, but then refused 0800 medications stating he already took them. ADLs impaired. Patient seen one-on-one this morning. However, he refuses to look at provider and remains completely mute. He is sitting up on side of bed with a notebook he has written in extensively with religious jargon. He also has a pen in his mouth. He will not remove voluntarily when asked, however, allows this provider to take from his mouth. When leaving his lips are moving and he is mumbling as if responding to internal stimuli.   Principal Problem: Schizophrenia (HCC) Diagnosis: Principal Problem:   Schizophrenia (HCC) Active Problems:   Diabetes (HCC)   Hypothyroid   Hyperlipidemia   HTN (hypertension)  Total Time spent with patient: 30 minutes  Past Psychiatric History: See H&P  Past Medical History:  Past Medical History:  Diagnosis Date   Diabetes mellitus without complication (HCC)    HTN (hypertension)    Hyperammonemia (HCC) While on Depakote   Hyperlipemia    Lithium toxicity    Neutropenia (HCC)    Schizophrenia (HCC)    Seizure (HCC) While toxic on Depakote   Sickle cell trait (HCC)    Thyroid disease    TIA (transient ischemic attack) in 2009   History reviewed. No pertinent surgical history. Family History: History reviewed. No pertinent family history. Family Psychiatric  History: See H&P Social History:  Social History   Substance and Sexual Activity  Alcohol Use No     Social History   Substance and Sexual Activity  Drug Use Not Currently    Social History   Socioeconomic History   Marital status: Single    Spouse name: Not on file   Number of children:  Not on file   Years of education: Not on file   Highest education level: Not on file  Occupational History   Not on file  Tobacco Use   Smoking status: Never   Smokeless tobacco: Never  Vaping Use   Vaping Use: Never used  Substance and Sexual Activity   Alcohol use: No   Drug use: Not Currently   Sexual activity: Not Currently  Other Topics Concern   Not on file  Social History Narrative   Not on file   Social Determinants of Health   Financial Resource Strain: Not on file  Food Insecurity: Not on file  Transportation Needs: Not on file  Physical Activity: Not on file  Stress: Not on file  Social Connections: Not on file   Additional Social History:                         Sleep: Fair  Appetite:  Fair  Current Medications: Current Facility-Administered Medications  Medication Dose Route Frequency Provider Last Rate Last Admin   acetaminophen (TYLENOL) tablet 650 mg  650 mg Oral Q6H PRN Clapacs, John T, MD       alum & mag hydroxide-simeth (MAALOX/MYLANTA) 200-200-20 MG/5ML suspension 30 mL  30 mL Oral Q4H PRN Clapacs, Jackquline Denmark, MD       aspirin EC tablet 81 mg  81 mg Oral Daily Clapacs, Jackquline Denmark, MD   81 mg at 04/11/21 0729   benztropine (COGENTIN) tablet 1  mg  1 mg Oral BID WC Clapacs, Jackquline Denmark, MD   1 mg at 04/11/21 1623   clonazePAM (KLONOPIN) tablet 0.5 mg  0.5 mg Oral BID WC Clapacs, John T, MD   0.5 mg at 04/11/21 1623   folic acid (FOLVITE) tablet 1 mg  1 mg Oral Daily Clapacs, Jackquline Denmark, MD   1 mg at 04/11/21 6812   haloperidol (HALDOL) tablet 15 mg  15 mg Oral BID WC Clapacs, Jackquline Denmark, MD   15 mg at 04/11/21 1623   haloperidol lactate (HALDOL) injection 15 mg  15 mg Intramuscular BID PRN Clapacs, Jackquline Denmark, MD       hydrOXYzine (ATARAX/VISTARIL) tablet 50 mg  50 mg Oral TID PRN Clapacs, Jackquline Denmark, MD       Melene Muller ON 04/13/2021] levothyroxine (SYNTHROID) tablet 125 mcg  125 mcg Oral Daily Jesse Sans, MD       magnesium hydroxide (MILK OF MAGNESIA) suspension 30  mL  30 mL Oral Daily PRN Clapacs, Jackquline Denmark, MD       metFORMIN (GLUCOPHAGE) tablet 1,000 mg  1,000 mg Oral BID WC Clapacs, John T, MD   1,000 mg at 04/11/21 1623   OLANZapine (ZYPREXA) injection 20 mg  20 mg Intramuscular BID PRN Clapacs, Jackquline Denmark, MD       OLANZapine zydis (ZYPREXA) disintegrating tablet 20 mg  20 mg Oral BID WC Clapacs, John T, MD   20 mg at 04/11/21 1623   simvastatin (ZOCOR) tablet 20 mg  20 mg Oral QHS Clapacs, John T, MD   20 mg at 04/11/21 2124   traZODone (DESYREL) tablet 150 mg  150 mg Oral QHS Jesse Sans, MD   150 mg at 04/11/21 2124   vitamin B-12 (CYANOCOBALAMIN) tablet 1,000 mcg  1,000 mcg Oral Daily Clapacs, Jackquline Denmark, MD   1,000 mcg at 04/11/21 0730    Lab Results: No results found for this or any previous visit (from the past 48 hour(s)).  Blood Alcohol level:  Lab Results  Component Value Date   ETH <10 04/08/2021   ETH <10 09/08/2019    Metabolic Disorder Labs: Lab Results  Component Value Date   HGBA1C 6.0 (H) 04/08/2021   MPG 126 04/08/2021   MPG 122.63 08/19/2019   No results found for: PROLACTIN Lab Results  Component Value Date   CHOL 156 04/08/2021   TRIG 66 04/08/2021   HDL 41 04/08/2021   CHOLHDL 3.8 04/08/2021   VLDL 13 04/08/2021   LDLCALC 102 (H) 04/08/2021    Physical Findings: AIMS:  , ,  ,  ,    CIWA:    COWS:     Musculoskeletal: Strength & Muscle Tone: within normal limits Gait & Station: normal Patient leans: N/A  Psychiatric Specialty Exam:  Presentation  General Appearance: Disheveled  Eye Contact:Absent Speech:Mute Speech Volume:Mute Handedness:Right   Mood and Affect  Mood:Irritable  Affect:Flat   Thought Process  Thought Processes:Unknown Descriptions of Associations:Unknown Orientation:Unknown Thought Content:Unknown History of Schizophrenia/Schizoaffective disorder:Yes  Duration of Psychotic Symptoms:Less than six months  Hallucinations:Unknown, seen responding to internal  stimuli  Ideas of Reference:Paranoia  Suicidal Thoughts:Unknown  Homicidal Thoughts:Unknown   Sensorium  Memory:Immediate Poor; Recent Poor; Remote Poor  Judgment:Impaired  Insight:Lacking   Executive Functions  Concentration:Poor  Attention Span:Poor  Recall:Poor  Fund of Knowledge:Poor  Language:Poor   Psychomotor Activity  Psychomotor Activity:TD present   Assets  Assets:Desire for Improvement; Financial Resources/Insurance; Housing; Resilience   Sleep  Sleep:Fair, 5 hours  Physical Exam: Physical Exam ROS Blood pressure (!) 148/84, pulse 90, temperature 98 F (36.7 C), temperature source Oral, resp. rate 18, height 6\' 3"  (1.905 m), weight 101 kg, SpO2 99 %. Body mass index is 27.83 kg/m.   Treatment Plan Summary: Daily contact with patient to assess and evaluate symptoms and progress in treatment and Medication management 65 year old male presenting with psychosis and increased aggression in the context of medication noncompliance. Currently taking medications intermittently. Today patient remains mute on exam, but is observed responding to internal stimuli.    Schizophrenia - Continue Haldol 15 mg BID, Zyprexa 20 mg BID, cogentin 1 mg BID - Offer Haldol LAI today  - He has previously failed monotherapy with Haldol, Zyprexa, Risperdal, and Prolixin   History of seizures/Anxiety - Continue klonopin 0.5 mg BID   Hypothyroidism - Synthroid 125 mcg daily    HTN - Lisinopril 2.5 mg daily    Diabetes Mellitus, Type 2 - Metformin 1000 mg BID    HLD- well controlled - Simvastatin 20 mg QHS, lipid panel 04/08/21 WNL aside from LDL minutely increased at 06/08/21.    Vitamin B12 deficiency - Cyanocobalamin 1000 mcg daily   04/12/21: Psychiatric exam above reviewed and remains accurate. Assessment and plan above reviewed and updated.    06/12/21, MD 04/12/2021, 9:52 AM

## 2021-04-12 NOTE — Progress Notes (Signed)
Patient was asked to come to the medication room to receive haldol decanoate injection two times. Patient walked away and refused to look at this Clinical research associate.

## 2021-04-12 NOTE — Group Note (Signed)
[facility] LCSW Group Therapy Note   Group Date: 04/12/2021 Start Time: 1300 End Time: 1325   Type of Therapy/Topic:  Group Therapy:  Balance in Life  Participation Level:  Did Not Attend   Description of Group:    This group will address the concept of balance and how it feels and looks when one is unbalanced. Patients will be encouraged to process areas in their lives that are out of balance, and identify reasons for remaining unbalanced. Facilitators will guide patients utilizing problem- solving interventions to address and correct the stressor making their life unbalanced. Understanding and applying boundaries will be explored and addressed for obtaining  and maintaining a balanced life. Patients will be encouraged to explore ways to assertively make their unbalanced needs known to significant others in their lives, using other group members and facilitator for support and feedback.  Therapeutic Goals: Patient will identify two or more emotions or situations they have that consume much of in their lives. Patient will identify signs/triggers that life has become out of balance:  Patient will identify two ways to set boundaries in order to achieve balance in their lives:  Patient will demonstrate ability to communicate their needs through discussion and/or role plays  Summary of Patient Progress: X  Therapeutic Modalities:   Cognitive Behavioral Therapy Solution-Focused Therapy Assertiveness Training   Darrelle Wiberg R Krystal Teachey, LCSW 

## 2021-04-12 NOTE — Progress Notes (Signed)
Recreation Therapy Notes  Date: 04/12/2021  Time: 10:00 am   Location: Craft room   Behavioral response: Appropriate  Intervention Topic: Stress Management    Discussion/Intervention:  Group content on today was focused on stress. The group defined stress and way to cope with stress. Participants expressed how they know when they are stresses out. Individuals described the different ways they have to cope with stress. The group stated reasons why it is important to cope with stress. Patient explained what good stress is and some examples. The group participated in the intervention "Stress Management". Individuals were separated into two group and answered questions related to stress.  Clinical Observations/Feedback: Patient came to group and expressed that God can help with stress. Individual was social with staff and peers while participating in the intervention.  [name] LRT/CTRS         Loney Domingo 04/12/2021 1:05 PM

## 2021-04-12 NOTE — Progress Notes (Signed)
Patient calm during assessment but wouldn't answer questions directly. Patient presents with religiosity and responding to internal stimuli. Patient observed interacting appropriately with staff and peers on the unit. Patient given education about medications, Pt still refused. Patient given support and encouragement to be active in his treatment plan. Pt being monitored Q 15 minutes for safety per unit protocol. Pt remains safe on the unit.

## 2021-04-12 NOTE — Progress Notes (Signed)
Patient does not respond to assessment questions. When asked to take his medications, patient states that he already took his medications this morning. Patient was educated that he took Synthroid this morning, but that he has additional 8am medications. Patient looks away and does not respond to this Clinical research associate. Medications were brought to his room 10 minutes later in a second attempt to have patient take medications. Patient continues to ignore this Clinical research associate. Patient is sitting in his room with his journal and a Bible. Patient remains safe on the unit at this time.

## 2021-04-12 NOTE — Progress Notes (Signed)
Patient was asked to come to medication room for evening medications. Patient refuses. Medications were brought to patient. Patient continues to refuse medications, stating that he does not have any illness or diagnoses that require medication. Patient was educated on medication and diagnosis. However, patient continues to deny needing medication.

## 2021-04-13 MED ORDER — HALOPERIDOL 5 MG PO TABS
10.0000 mg | ORAL_TABLET | Freq: Two times a day (BID) | ORAL | Status: DC
  Administered 2021-04-13 – 2021-04-20 (×14): 10 mg via ORAL
  Filled 2021-04-13 (×14): qty 2

## 2021-04-13 MED ORDER — OLANZAPINE 10 MG IM SOLR: 20.0000 mg | Freq: Two times a day (BID) | INTRAMUSCULAR | Status: DC

## 2021-04-13 MED ORDER — HALOPERIDOL LACTATE 5 MG/ML IJ SOLN: 10.0000 mg | Freq: Two times a day (BID) | INTRAMUSCULAR | Status: DC

## 2021-04-13 MED ORDER — OLANZAPINE 5 MG PO TBDP
20.0000 mg | ORAL_TABLET | Freq: Two times a day (BID) | ORAL | Status: DC
  Administered 2021-04-13 – 2021-04-20 (×14): 20 mg via ORAL
  Filled 2021-04-13 (×14): qty 4

## 2021-04-13 NOTE — Progress Notes (Signed)
Recreation Therapy Notes   Date: 04/13/2021  Time: 10:30 am   Location: Craft room    Behavioral response: N/A   Intervention Topic: Self-care  Discussion/Intervention: Patient did not attend group.   Clinical Observations/Feedback:  Patient did not attend group.   Rydan Gulyas LRT/CTRS        Assata Juncaj 04/13/2021 12:35 PM

## 2021-04-13 NOTE — Progress Notes (Signed)
[facility] MD Progress Note  04/13/2021 10:31 AM [name]  MRN:  [id number]  CC Follow-up for patient with schizophrenia  Subjective:   65 year old male with schizophrenia brought to emergency room by group home for increasing agitation and medication non-compliance. No acute events overnight, however has refused medications last 3 shifts. ADLs impaired. Patient assessed one-on-one this morning. He continues to look at the ground, and is mumbling to himself. He refuses to answer any questions. However, he is heard saying "God's plan" and "persecution." Patient with long standing history of schizophrenia that will not improve without the use of medications. At this time I feel patient warrants non-emergent forced medications. Will consult Dr. Toni Amend for second opinion.   Principal Problem: Schizophrenia (HCC) Diagnosis: Principal Problem:   Schizophrenia (HCC) Active Problems:   Diabetes (HCC)   Hypothyroid   Hyperlipidemia   HTN (hypertension)  Total Time spent with patient: 30 minutes  Past Psychiatric History: See H&P  Past Medical History:  Past Medical History:  Diagnosis Date   Diabetes mellitus without complication (HCC)    HTN (hypertension)    Hyperammonemia (HCC) While on Depakote   Hyperlipemia    Lithium toxicity    Neutropenia (HCC)    Schizophrenia (HCC)    Seizure (HCC) While toxic on Depakote   Sickle cell trait (HCC)    Thyroid disease    TIA (transient ischemic attack) in 2009   History reviewed. No pertinent surgical history. Family History: History reviewed. No pertinent family history. Family Psychiatric  History: See H&P Social History:  Social History   Substance and Sexual Activity  Alcohol Use No     Social History   Substance and Sexual Activity  Drug Use Not Currently    Social History   Socioeconomic History   Marital status: Single    Spouse name: Not on file   Number of children: Not on file   Years of education: Not on file   Highest  education level: Not on file  Occupational History   Not on file  Tobacco Use   Smoking status: Never   Smokeless tobacco: Never  Vaping Use   Vaping Use: Never used  Substance and Sexual Activity   Alcohol use: No   Drug use: Not Currently   Sexual activity: Not Currently  Other Topics Concern   Not on file  Social History Narrative   Not on file   Social Determinants of Health   Financial Resource Strain: Not on file  Food Insecurity: Not on file  Transportation Needs: Not on file  Physical Activity: Not on file  Stress: Not on file  Social Connections: Not on file   Additional Social History:                         Sleep: Fair  Appetite:  Fair  Current Medications: Current Facility-Administered Medications  Medication Dose Route Frequency Provider Last Rate Last Admin   acetaminophen (TYLENOL) tablet 650 mg  650 mg Oral Q6H PRN Clapacs, John T, MD       alum & mag hydroxide-simeth (MAALOX/MYLANTA) 200-200-20 MG/5ML suspension 30 mL  30 mL Oral Q4H PRN Clapacs, Jackquline Denmark, MD       aspirin EC tablet 81 mg  81 mg Oral Daily Clapacs, John T, MD   81 mg at 04/11/21 0729   benztropine (COGENTIN) tablet 1 mg  1 mg Oral BID WC Clapacs, Jackquline Denmark, MD   1 mg at 04/11/21 1623  clonazePAM (KLONOPIN) tablet 0.5 mg  0.5 mg Oral BID WC Clapacs, John T, MD   0.5 mg at 04/11/21 1623   folic acid (FOLVITE) tablet 1 mg  1 mg Oral Daily Clapacs, Jackquline Denmark, MD   1 mg at 04/11/21 0729   haloperidol (HALDOL) tablet 15 mg  15 mg Oral BID WC Clapacs, Jackquline Denmark, MD   15 mg at 04/11/21 1623   haloperidol decanoate (HALDOL DECANOATE) 100 MG/ML injection 200 mg  200 mg Intramuscular Q30 days Jesse Sans, MD       haloperidol lactate (HALDOL) injection 15 mg  15 mg Intramuscular BID PRN Clapacs, Jackquline Denmark, MD       hydrOXYzine (ATARAX/VISTARIL) tablet 50 mg  50 mg Oral TID PRN Clapacs, Jackquline Denmark, MD       levothyroxine (SYNTHROID) tablet 125 mcg  125 mcg Oral Daily Jesse Sans, MD        magnesium hydroxide (MILK OF MAGNESIA) suspension 30 mL  30 mL Oral Daily PRN Clapacs, Jackquline Denmark, MD       metFORMIN (GLUCOPHAGE) tablet 1,000 mg  1,000 mg Oral BID WC Clapacs, John T, MD   1,000 mg at 04/11/21 1623   OLANZapine (ZYPREXA) injection 20 mg  20 mg Intramuscular BID PRN Clapacs, Jackquline Denmark, MD       OLANZapine zydis (ZYPREXA) disintegrating tablet 20 mg  20 mg Oral BID WC Clapacs, John T, MD   20 mg at 04/11/21 1623   simvastatin (ZOCOR) tablet 20 mg  20 mg Oral QHS Clapacs, John T, MD   20 mg at 04/11/21 2124   traZODone (DESYREL) tablet 150 mg  150 mg Oral QHS Jesse Sans, MD   150 mg at 04/11/21 2124   vitamin B-12 (CYANOCOBALAMIN) tablet 1,000 mcg  1,000 mcg Oral Daily Clapacs, Jackquline Denmark, MD   1,000 mcg at 04/11/21 0730    Lab Results: No results found for this or any previous visit (from the past 48 hour(s)).  Blood Alcohol level:  Lab Results  Component Value Date   ETH <10 04/08/2021   ETH <10 09/08/2019    Metabolic Disorder Labs: Lab Results  Component Value Date   HGBA1C 6.0 (H) 04/08/2021   MPG 126 04/08/2021   MPG 122.63 08/19/2019   No results found for: PROLACTIN Lab Results  Component Value Date   CHOL 156 04/08/2021   TRIG 66 04/08/2021   HDL 41 04/08/2021   CHOLHDL 3.8 04/08/2021   VLDL 13 04/08/2021   LDLCALC 102 (H) 04/08/2021    Physical Findings: AIMS:  , ,  ,  ,    CIWA:    COWS:     Musculoskeletal: Strength & Muscle Tone: within normal limits Gait & Station: normal Patient leans: N/A  Psychiatric Specialty Exam:  Presentation  General Appearance: Disheveled  Eye Contact:Absent Speech:Mute Speech Volume:Mute Handedness:Right   Mood and Affect  Mood:Irritable  Affect:Flat   Thought Process  Thought Processes:Unknown Descriptions of Associations:Unknown Orientation:Unknown Thought Content:Unknown History of Schizophrenia/Schizoaffective disorder:Yes  Duration of Psychotic Symptoms:Less than six  months  Hallucinations:Unknown, seen responding to internal stimuli  Ideas of Reference:Paranoia  Suicidal Thoughts:Unknown  Homicidal Thoughts:Unknown   Sensorium  Memory:Immediate Poor; Recent Poor; Remote Poor  Judgment:Impaired  Insight:Lacking   Executive Functions  Concentration:Poor  Attention Span:Poor  Recall:Poor  Fund of Knowledge:Poor  Language:Poor   Psychomotor Activity  Psychomotor Activity:TD present   Assets  Assets:Desire for Improvement; Financial Resources/Insurance; Housing; Resilience   Sleep  Sleep:Fair,  7.75 hours    Physical Exam: Physical Exam ROS Blood pressure (!) 148/84, pulse 90, temperature 98 F (36.7 C), temperature source Oral, resp. rate 18, height 6\' 3"  (1.905 m), weight 101 kg, SpO2 99 %. Body mass index is 27.83 kg/m.   Treatment Plan Summary: Daily contact with patient to assess and evaluate symptoms and progress in treatment and Medication management 65 year old male presenting with psychosis and increased aggression in the context of medication noncompliance. He has been refusing vital signs and medications. Patient with long standing history of schizophrenia that will not improve without the use of medications. At this time I feel patient warrants non-emergent forced medications.    Schizophrenia - Continue Haldol 15 mg BID, Zyprexa 20 mg BID, cogentin 1 mg BID - Continue to offer Haldol LAI refused yesterday - He has previously failed monotherapy with Haldol, Zyprexa, Risperdal, and Prolixin   History of seizures/Anxiety - Continue klonopin 0.5 mg BID   Hypothyroidism - Synthroid 125 mcg daily    HTN - Lisinopril 2.5 mg daily    Diabetes Mellitus, Type 2 - Metformin 1000 mg BID    HLD- well controlled - Simvastatin 20 mg QHS, lipid panel 04/08/21 WNL aside from LDL minutely increased at 06/08/21.    Vitamin B12 deficiency - Cyanocobalamin 1000 mcg daily   04/13/21: Psychiatric exam above reviewed and  remains accurate. Assessment and plan above reviewed and updated.     06/13/21, MD 04/13/2021, 10:31 AM

## 2021-04-13 NOTE — Progress Notes (Addendum)
  Pt refused meds. MD aware. Torrie Mayers RN

## 2021-04-13 NOTE — Progress Notes (Signed)
[facility] Second Physician Opinion Progress Note for Medication Administration to Non-consenting Patients (For Involuntarily Committed Patients)  Patient: [name] Date of Birth: [id number] MRN: [id number]  Reason for the Medication: The patient, without the benefit of the specific treatment measure, is incapable of participating in any available treatment plan that will give the patient a realistic opportunity of improving the patient's condition.  Consideration of Side Effects: Consideration of the side effects related to the medication plan has been given.  Rationale for Medication Administration: Patient has schizophrenia and is not taking medication due to delusions and disordered thinking. Without medication very unlikely to improve to a point where he could leave the hospital. Patient incapable of rational decisions in this area.    Mordecai Rasmussen, MD 04/13/21  11:12 AM   This documentation is good for (7) seven days from the date of the MD signature. New documentation must be completed every seven (7) days with detailed justification in the medical record if the patient requires continued non-emergent administration of psychotropic medications.

## 2021-04-13 NOTE — Group Note (Signed)
[facility] LCSW Group Therapy Note   Group Date: 04/13/2021 Start Time: 1315 End Time: 1405  Type of Therapy and Topic:  Group Therapy:  Feelings around Relapse and Recovery  Participation Level:  Did Not Attend     Description of Group:    Patients in this group will discuss emotions they experience before and after a relapse. They will process how experiencing these feelings, or avoidance of experiencing them, relates to having a relapse. Facilitator will guide patients to explore emotions they have related to recovery. Patients will be encouraged to process which emotions are more powerful. They will be guided to discuss the emotional reaction significant others in their lives may have to patients' relapse or recovery. Patients will be assisted in exploring ways to respond to the emotions of others without this contributing to a relapse.  Therapeutic Goals: Patient will identify two or more emotions that lead to relapse for them:  Patient will identify two emotions that result when they relapse:  Patient will identify two emotions related to recovery:  Patient will demonstrate ability to communicate their needs through discussion and/or role plays.   Summary of Patient Progress:  X   Therapeutic Modalities:   Cognitive Behavioral Therapy Solution-Focused Therapy Assertiveness Training Relapse Prevention Therapy   Lashuna Tamashiro A Temesgen Weightman, LCSWA 

## 2021-04-13 NOTE — Progress Notes (Signed)
Pt finally took his psych meds this afternoon orally. He came to the door and asked what meds did we want him to take. He did not attend any groups today and has mainly been withdrawn. He has had a Bible out and doing a lot of writing on paper. His speech is slurred and difficult to understand.Pt Is calm. Torrie Mayers RN

## 2021-04-13 NOTE — Plan of Care (Signed)
Pt UTA. Pt was educated with no evidence of learning. Torrie Mayers RN Problem: Education: Goal: Charity fundraiser Education information/materials will improve Outcome: Not Progressing Goal: Emotional status will improve Outcome: Not Progressing Goal: Mental status will improve Outcome: Not Progressing Goal: Verbalization of understanding the information provided will improve Outcome: Not Progressing   Problem: Safety: Goal: Periods of time without injury will increase Outcome: Not Progressing   Problem: Education: Goal: Will be free of psychotic symptoms Outcome: Not Progressing Goal: Knowledge of the prescribed therapeutic regimen will improve Outcome: Not Progressing   Problem: Self-Concept: Goal: Will verbalize positive feelings about self Outcome: Not Progressing

## 2021-04-14 DIAGNOSIS — F203 Undifferentiated schizophrenia: Principal | ICD-10-CM

## 2021-04-14 NOTE — Progress Notes (Signed)
Appears to be asleep in bed as nurse approaches. Arouses to name called. Informed that it is time to take his medication and patient requests a snack and provided graham crackers. He denies SI/HI/AVH at this time, but is anxious and obviously preoccupied with pressured speech as he speaks about his religious beliefs and states, "Jesus heals all." Consumed medications with a cup of water and was cooperative. Remains safe on the unit with q15 minute safety checks.

## 2021-04-14 NOTE — Group Note (Signed)
LCSW Group Therapy Note  Group Date: 04/14/2021 Start Time: 1305 End Time: 1405   Type of Therapy and Topic:  Group Therapy - Healthy vs Unhealthy Coping Skills  Participation Level:  Did Not Attend   Description of Group The focus of this group was to determine what unhealthy coping techniques typically are used by group members and what healthy coping techniques would be helpful in coping with various problems. Patients were guided in becoming aware of the differences between healthy and unhealthy coping techniques. Patients were asked to identify 2-3 healthy coping skills they would like to learn to use more effectively.  Therapeutic Goals Patients learned that coping is what human beings do all day long to deal with various situations in their lives Patients defined and discussed healthy vs unhealthy coping techniques Patients identified their preferred coping techniques and identified whether these were healthy or unhealthy Patients determined 2-3 healthy coping skills they would like to become more familiar with and use more often. Patients provided support and ideas to each other   Summary of Patient Progress:  Patient did not attend group despite encouraged participation.    Therapeutic Modalities Cognitive Behavioral Therapy Motivational Interviewing  Norberto Sorenson, Theresia Majors 04/14/2021  3:04 PM

## 2021-04-14 NOTE — Progress Notes (Signed)
Patient refused his evening trazodone and cholesterol medication. Speech is slurred. Stated he did not need a sleeping pill. Denies SI and HI

## 2021-04-14 NOTE — Progress Notes (Signed)
Pt was UTA for the most of the day. He was much more social, he went outdoors and has been watching tv in the day room. Pt did take his pych meds today but was very paranoid and hesitant. Pt has been calm and cooperative. Torrie Mayers RN

## 2021-04-14 NOTE — Plan of Care (Addendum)
Pt difficult to assess. Pt did take psych. meds. Torrie Mayers RN Problem: Education: Goal: Knowledge of  General Education information/materials will improve Outcome: Progressing Goal: Emotional status will improve Outcome: Progressing Goal: Mental status will improve Outcome: Progressing Goal: Verbalization of understanding the information provided will improve Outcome: Progressing   Problem: Safety: Goal: Periods of time without injury will increase Outcome: Progressing   Problem: Education: Goal: Will be free of psychotic symptoms Outcome: Progressing Goal: Knowledge of the prescribed therapeutic regimen will improve Outcome: Progressing   Problem: Self-Concept: Goal: Will verbalize positive feelings about self Outcome: Progressing

## 2021-04-14 NOTE — Plan of Care (Signed)
  Problem: Education: Goal: Knowledge of [facility] Education information/materials will improve Outcome: Not Progressing Goal: Emotional status will improve Outcome: Not Progressing Goal: Mental status will improve Outcome: Not Progressing Goal: Verbalization of understanding the information provided will improve Outcome: Not Progressing   Problem: Safety: Goal: Periods of time without injury will increase Outcome: Not Progressing   Problem: Education: Goal: Will be free of psychotic symptoms Outcome: Not Progressing Goal: Knowledge of the prescribed therapeutic regimen will improve Outcome: Not Progressing   Problem: Self-Concept: Goal: Will verbalize positive feelings about self Outcome: Not Progressing

## 2021-04-14 NOTE — Progress Notes (Signed)
[facility] MD Progress Note  04/14/2021 11:17 AM [name]  MRN:  [id number] Subjective: Follow-up for this 65 year old man with schizophrenia well-known to the psychiatric service.  Patient was started on orders for forced medicine yesterday but has been taking his medicines orally since then.  On interview today he remains disorganized and difficult to engage.  Makes very little eye contact.  Keeps returning to his Bible.  When asked about whether he is eating he says he eats whatever God tells him.  I reminded him that Jesus and the other major figures in the Bible are all recorded as having eaten and that his fasting is getting extreme.  Patient says he does not understand why he is in the hospital and I explained how the group home has concerns about his health when he is fasting excessively and off his medicine.  No new specific complaints.  No aggression on the unit.  No new labs today. Principal Problem: Schizophrenia (HCC) Diagnosis: Principal Problem:   Schizophrenia (HCC) Active Problems:   Diabetes (HCC)   Hypothyroid   Hyperlipidemia   HTN (hypertension)  Total Time spent with patient: 30 minutes  Past Psychiatric History: Past history of schizophrenia recurrent hospitalizations related to medicine noncompliance with resultant psychosis and problematic behavior  Past Medical History:  Past Medical History:  Diagnosis Date   Diabetes mellitus without complication (HCC)    HTN (hypertension)    Hyperammonemia (HCC) While on Depakote   Hyperlipemia    Lithium toxicity    Neutropenia (HCC)    Schizophrenia (HCC)    Seizure (HCC) While toxic on Depakote   Sickle cell trait (HCC)    Thyroid disease    TIA (transient ischemic attack) in 2009   History reviewed. No pertinent surgical history. Family History: History reviewed. No pertinent family history. Family Psychiatric  History: None reported Social History:  Social History   Substance and Sexual Activity  Alcohol Use No      Social History   Substance and Sexual Activity  Drug Use Not Currently    Social History   Socioeconomic History   Marital status: Single    Spouse name: Not on file   Number of children: Not on file   Years of education: Not on file   Highest education level: Not on file  Occupational History   Not on file  Tobacco Use   Smoking status: Never   Smokeless tobacco: Never  Vaping Use   Vaping Use: Never used  Substance and Sexual Activity   Alcohol use: No   Drug use: Not Currently   Sexual activity: Not Currently  Other Topics Concern   Not on file  Social History Narrative   Not on file   Social Determinants of Health   Financial Resource Strain: Not on file  Food Insecurity: Not on file  Transportation Needs: Not on file  Physical Activity: Not on file  Stress: Not on file  Social Connections: Not on file   Additional Social History:                         Sleep: Fair  Appetite:  Poor  Current Medications: Current Facility-Administered Medications  Medication Dose Route Frequency Provider Last Rate Last Admin   acetaminophen (TYLENOL) tablet 650 mg  650 mg Oral Q6H PRN Delia Sitar T, MD       alum & mag hydroxide-simeth (MAALOX/MYLANTA) 200-200-20 MG/5ML suspension 30 mL  30 mL Oral Q4H PRN Braylee Bosher,  Jackquline Denmark, MD       aspirin EC tablet 81 mg  81 mg Oral Daily Ayza Ripoll, Jackquline Denmark, MD   81 mg at 04/11/21 6270   benztropine (COGENTIN) tablet 1 mg  1 mg Oral BID WC Nahima Ales, Jackquline Denmark, MD   1 mg at 04/11/21 1623   clonazePAM (KLONOPIN) tablet 0.5 mg  0.5 mg Oral BID WC Gerre Ranum T, MD   0.5 mg at 04/14/21 0801   folic acid (FOLVITE) tablet 1 mg  1 mg Oral Daily Yuette Putnam T, MD   1 mg at 04/11/21 0729   haloperidol (HALDOL) tablet 10 mg  10 mg Oral BID Jesse Sans, MD   10 mg at 04/14/21 3500   Or   haloperidol lactate (HALDOL) injection 10 mg  10 mg Intramuscular BID Jesse Sans, MD       haloperidol decanoate (HALDOL DECANOATE) 100 MG/ML  injection 200 mg  200 mg Intramuscular Q30 days Jesse Sans, MD       hydrOXYzine (ATARAX/VISTARIL) tablet 50 mg  50 mg Oral TID PRN Anjeli Casad, Jackquline Denmark, MD       levothyroxine (SYNTHROID) tablet 125 mcg  125 mcg Oral Daily Jesse Sans, MD       magnesium hydroxide (MILK OF MAGNESIA) suspension 30 mL  30 mL Oral Daily PRN Lachlyn Vanderstelt T, MD       metFORMIN (GLUCOPHAGE) tablet 1,000 mg  1,000 mg Oral BID WC Javious Hallisey T, MD   1,000 mg at 04/11/21 1623   OLANZapine zydis (ZYPREXA) disintegrating tablet 20 mg  20 mg Oral BID WC Jesse Sans, MD   20 mg at 04/14/21 9381   Or   OLANZapine (ZYPREXA) injection 20 mg  20 mg Intramuscular BID WC Jesse Sans, MD       simvastatin (ZOCOR) tablet 20 mg  20 mg Oral QHS Marjean Imperato, Jackquline Denmark, MD   20 mg at 04/13/21 2102   traZODone (DESYREL) tablet 150 mg  150 mg Oral QHS Jesse Sans, MD   150 mg at 04/13/21 2101   vitamin B-12 (CYANOCOBALAMIN) tablet 1,000 mcg  1,000 mcg Oral Daily Malerie Eakins, Jackquline Denmark, MD   1,000 mcg at 04/11/21 0730    Lab Results: No results found for this or any previous visit (from the past 48 hour(s)).  Blood Alcohol level:  Lab Results  Component Value Date   ETH <10 04/08/2021   ETH <10 09/08/2019    Metabolic Disorder Labs: Lab Results  Component Value Date   HGBA1C 6.0 (H) 04/08/2021   MPG 126 04/08/2021   MPG 122.63 08/19/2019   No results found for: PROLACTIN Lab Results  Component Value Date   CHOL 156 04/08/2021   TRIG 66 04/08/2021   HDL 41 04/08/2021   CHOLHDL 3.8 04/08/2021   VLDL 13 04/08/2021   LDLCALC 102 (H) 04/08/2021    Physical Findings: AIMS:  , ,  ,  ,    CIWA:    COWS:     Musculoskeletal: Strength & Muscle Tone: within normal limits Gait & Station: normal Patient leans: N/A  Psychiatric Specialty Exam:  Presentation  General Appearance: Disheveled  Eye Contact:Fair  Speech:Garbled; Pressured  Speech Volume:Decreased  Handedness:Right   Mood and Affect   Mood:Irritable  Affect:Flat   Thought Process  Thought Processes:Disorganized  Descriptions of Associations:Tangential  Orientation:Partial  Thought Content:Illogical; Scattered  History of Schizophrenia/Schizoaffective disorder:Yes  Duration of Psychotic Symptoms:Less than six months  Hallucinations:No data  recorded Ideas of Reference:Paranoia  Suicidal Thoughts:No data recorded Homicidal Thoughts:No data recorded  Sensorium  Memory:Immediate Poor; Recent Poor; Remote Poor  Judgment:Impaired  Insight:Lacking   Executive Functions  Concentration:Poor  Attention Span:Poor  Recall:Poor  Fund of Knowledge:Poor  Language:Poor   Psychomotor Activity  Psychomotor Activity: No data recorded  Assets  Assets:Desire for Improvement; Financial Resources/Insurance; Housing; Resilience   Sleep  Sleep: No data recorded   Physical Exam: Physical Exam Vitals and nursing note reviewed.  Constitutional:      Appearance: Normal appearance.  HENT:     Head: Normocephalic and atraumatic.     Mouth/Throat:     Pharynx: Oropharynx is clear.  Eyes:     Pupils: Pupils are equal, round, and reactive to light.  Cardiovascular:     Rate and Rhythm: Normal rate and regular rhythm.  Pulmonary:     Effort: Pulmonary effort is normal.     Breath sounds: Normal breath sounds.  Abdominal:     General: Abdomen is flat.     Palpations: Abdomen is soft.  Musculoskeletal:        General: Normal range of motion.  Skin:    General: Skin is warm and dry.  Neurological:     General: No focal deficit present.     Mental Status: He is alert. Mental status is at baseline.  Psychiatric:        Attention and Perception: He is inattentive.        Mood and Affect: Mood normal. Affect is blunt.        Speech: Speech is delayed and tangential.        Behavior: Behavior is withdrawn.        Thought Content: Thought content is paranoid.        Cognition and Memory: Cognition is  impaired.        Judgment: Judgment is inappropriate.   Review of Systems  Constitutional: Negative.   HENT: Negative.    Eyes: Negative.   Respiratory: Negative.    Cardiovascular: Negative.   Gastrointestinal: Negative.   Musculoskeletal: Negative.   Skin: Negative.   Neurological: Negative.   Psychiatric/Behavioral: Negative.    Blood pressure (!) 148/84, pulse 90, temperature 98 F (36.7 C), temperature source Oral, resp. rate 18, height 6\' 3"  (1.905 m), weight 101 kg, SpO2 99 %. Body mass index is 27.83 kg/m.   Treatment Plan Summary: Plan patient continues to appear more psychotic than he should be at his baseline.  Still ambivalent about eating.  Taking medicine but only under coercion.  I agree with continuing forced medicine order.  Tried to engage in some rapport with the patient and encouraged him to eat normally and to voice any concerns to staff.  , MD 04/14/2021, 11:17 AM

## 2021-04-15 NOTE — Group Note (Signed)
LCSW Group Therapy Note  Group Date: 04/15/2021 Start Time: 1300 End Time: 1400   Type of Therapy and Topic:  Group Therapy - How To Cope with Nervousness about Discharge   Participation Level:  Did Not Attend   Description of Group This process group involved identification of patients' feelings about discharge. Some of them are scheduled to be discharged soon, while others are new admissions, but each of them was asked to share thoughts and feelings surrounding discharge from the hospital. One common theme was that they are excited at the prospect of going home, while another was that many of them are apprehensive about sharing why they were hospitalized. Patients were given the opportunity to discuss these feelings with their peers in preparation for discharge.  Therapeutic Goals  Patient will identify their overall feelings about pending discharge. Patient will think about how they might proactively address issues that they believe will once again arise once they get home (i.e. with parents). Patients will participate in discussion about having hope for change.   Summary of Patient Progress: Patient walked into group room, stood in the doorway for a few minutes and left a few minutes later despite being encouraged to join group.    Therapeutic Modalities Cognitive Behavioral Therapy   Norberto Sorenson, Theresia Majors 04/15/2021  2:54 PM

## 2021-04-15 NOTE — Progress Notes (Signed)
[facility] MD Progress Note  04/15/2021 12:26 PM [name]  MRN:  [id number] Subjective: Follow-up 65 year old man with schizophrenia.  Nursing reports that he took his medicine this morning and he is also eating more normally.  One-to-one interview the patient as always remains distant and hard to communicate with.  Tends to communicate mostly in hyper religious phrases.  No violence no aggression.  Basically taking care of his hygiene.  Overall looks more like he is getting back to baseline Principal Problem: Schizophrenia (HCC) Diagnosis: Principal Problem:   Schizophrenia (HCC) Active Problems:   Diabetes (HCC)   Hypothyroid   Hyperlipidemia   HTN (hypertension)  Total Time spent with patient: 30 minutes  Past Psychiatric History: Past history of chronic schizophrenia.  Decompensations tend to take on the character we have seen this time.  Past Medical History:  Past Medical History:  Diagnosis Date   Diabetes mellitus without complication (HCC)    HTN (hypertension)    Hyperammonemia (HCC) While on Depakote   Hyperlipemia    Lithium toxicity    Neutropenia (HCC)    Schizophrenia (HCC)    Seizure (HCC) While toxic on Depakote   Sickle cell trait (HCC)    Thyroid disease    TIA (transient ischemic attack) in 2009   History reviewed. No pertinent surgical history. Family History: History reviewed. No pertinent family history. Family Psychiatric  History: See previous Social History:  Social History   Substance and Sexual Activity  Alcohol Use No     Social History   Substance and Sexual Activity  Drug Use Not Currently    Social History   Socioeconomic History   Marital status: Single    Spouse name: Not on file   Number of children: Not on file   Years of education: Not on file   Highest education level: Not on file  Occupational History   Not on file  Tobacco Use   Smoking status: Never   Smokeless tobacco: Never  Vaping Use   Vaping Use: Never used   Substance and Sexual Activity   Alcohol use: No   Drug use: Not Currently   Sexual activity: Not Currently  Other Topics Concern   Not on file  Social History Narrative   Not on file   Social Determinants of Health   Financial Resource Strain: Not on file  Food Insecurity: Not on file  Transportation Needs: Not on file  Physical Activity: Not on file  Stress: Not on file  Social Connections: Not on file   Additional Social History:                         Sleep: Fair  Appetite:  Fair  Current Medications: Current Facility-Administered Medications  Medication Dose Route Frequency Provider Last Rate Last Admin   acetaminophen (TYLENOL) tablet 650 mg  650 mg Oral Q6H PRN Bhavik Cabiness T, MD       alum & mag hydroxide-simeth (MAALOX/MYLANTA) 200-200-20 MG/5ML suspension 30 mL  30 mL Oral Q4H PRN Lorella Gomez T, MD       aspirin EC tablet 81 mg  81 mg Oral Daily Amanat Hackel T, MD   81 mg at 04/15/21 0809   benztropine (COGENTIN) tablet 1 mg  1 mg Oral BID WC Tykera Skates T, MD   1 mg at 04/15/21 0809   clonazePAM (KLONOPIN) tablet 0.5 mg  0.5 mg Oral BID WC Ki Corbo T, MD   0.5 mg at 04/15/21  0809   folic acid (FOLVITE) tablet 1 mg  1 mg Oral Daily Shaneen Reeser T, MD   1 mg at 04/15/21 0809   haloperidol (HALDOL) tablet 10 mg  10 mg Oral BID Jesse Sans, MD   10 mg at 04/15/21 5027   Or   haloperidol lactate (HALDOL) injection 10 mg  10 mg Intramuscular BID Jesse Sans, MD       haloperidol decanoate (HALDOL DECANOATE) 100 MG/ML injection 200 mg  200 mg Intramuscular Q30 days Jesse Sans, MD       hydrOXYzine (ATARAX/VISTARIL) tablet 50 mg  50 mg Oral TID PRN Kamirah Shugrue, Jackquline Denmark, MD   50 mg at 04/15/21 0809   levothyroxine (SYNTHROID) tablet 125 mcg  125 mcg Oral Daily Jesse Sans, MD   125 mcg at 04/15/21 0810   magnesium hydroxide (MILK OF MAGNESIA) suspension 30 mL  30 mL Oral Daily PRN Lennyx Verdell T, MD       metFORMIN (GLUCOPHAGE)  tablet 1,000 mg  1,000 mg Oral BID WC Sheronda Parran T, MD   1,000 mg at 04/15/21 0810   OLANZapine zydis (ZYPREXA) disintegrating tablet 20 mg  20 mg Oral BID WC Jesse Sans, MD   20 mg at 04/15/21 0810   Or   OLANZapine (ZYPREXA) injection 20 mg  20 mg Intramuscular BID WC Jesse Sans, MD       simvastatin (ZOCOR) tablet 20 mg  20 mg Oral QHS Jaime Grizzell, Jackquline Denmark, MD   20 mg at 04/14/21 2137   traZODone (DESYREL) tablet 150 mg  150 mg Oral QHS Jesse Sans, MD   150 mg at 04/14/21 2137   vitamin B-12 (CYANOCOBALAMIN) tablet 1,000 mcg  1,000 mcg Oral Daily Drayke Grabel, Jackquline Denmark, MD   1,000 mcg at 04/15/21 7412    Lab Results: No results found for this or any previous visit (from the past 48 hour(s)).  Blood Alcohol level:  Lab Results  Component Value Date   ETH <10 04/08/2021   ETH <10 09/08/2019    Metabolic Disorder Labs: Lab Results  Component Value Date   HGBA1C 6.0 (H) 04/08/2021   MPG 126 04/08/2021   MPG 122.63 08/19/2019   No results found for: PROLACTIN Lab Results  Component Value Date   CHOL 156 04/08/2021   TRIG 66 04/08/2021   HDL 41 04/08/2021   CHOLHDL 3.8 04/08/2021   VLDL 13 04/08/2021   LDLCALC 102 (H) 04/08/2021    Physical Findings: AIMS:  , ,  ,  ,    CIWA:    COWS:     Musculoskeletal: Strength & Muscle Tone: within normal limits Gait & Station: normal Patient leans: N/A  Psychiatric Specialty Exam:  Presentation  General Appearance: Disheveled  Eye Contact:Fair  Speech:Garbled; Pressured  Speech Volume:Decreased  Handedness:Right   Mood and Affect  Mood:Irritable  Affect:Flat   Thought Process  Thought Processes:Disorganized  Descriptions of Associations:Tangential  Orientation:Partial  Thought Content:Illogical; Scattered  History of Schizophrenia/Schizoaffective disorder:Yes  Duration of Psychotic Symptoms:Less than six months  Hallucinations:No data recorded Ideas of Reference:Paranoia  Suicidal  Thoughts:No data recorded Homicidal Thoughts:No data recorded  Sensorium  Memory:Immediate Poor; Recent Poor; Remote Poor  Judgment:Impaired  Insight:Lacking   Executive Functions  Concentration:Poor  Attention Span:Poor  Recall:Poor  Fund of Knowledge:Poor  Language:Poor   Psychomotor Activity  Psychomotor Activity: No data recorded  Assets  Assets:Desire for Improvement; Financial Resources/Insurance; Housing; Resilience   Sleep  Sleep: No data recorded  Physical Exam: Physical Exam Vitals and nursing note reviewed.  Constitutional:      Appearance: Normal appearance.  HENT:     Head: Normocephalic and atraumatic.     Mouth/Throat:     Pharynx: Oropharynx is clear.  Eyes:     Pupils: Pupils are equal, round, and reactive to light.  Cardiovascular:     Rate and Rhythm: Normal rate and regular rhythm.  Pulmonary:     Effort: Pulmonary effort is normal.     Breath sounds: Normal breath sounds.  Abdominal:     General: Abdomen is flat.     Palpations: Abdomen is soft.  Musculoskeletal:        General: Normal range of motion.  Skin:    General: Skin is warm and dry.  Neurological:     General: No focal deficit present.     Mental Status: He is alert. Mental status is at baseline.  Psychiatric:        Attention and Perception: He is inattentive.        Mood and Affect: Mood normal. Affect is blunt.        Speech: Speech is delayed.        Behavior: Behavior is slowed.        Thought Content: Thought content is delusional.        Cognition and Memory: Cognition is impaired.   Review of Systems  Constitutional: Negative.   HENT: Negative.    Eyes: Negative.   Respiratory: Negative.    Cardiovascular: Negative.   Gastrointestinal: Negative.   Musculoskeletal: Negative.   Skin: Negative.   Neurological: Negative.   Psychiatric/Behavioral: Negative.    Blood pressure (!) 148/84, pulse 90, temperature 98 F (36.7 C), temperature source Oral,  resp. rate 18, height 6\' 3"  (1.905 m), weight 101 kg, SpO2 99 %. Body mass index is 27.83 kg/m.   Treatment Plan Summary: Plan no change to current medication.  Probably ought to recheck his vitals as his last blood pressures were slightly high.  Otherwise medically stable might be getting close to discharge.  Encourage patient and praise his current condition.  , MD 04/15/2021, 12:26 PM

## 2021-04-16 NOTE — Progress Notes (Signed)
Recreation Therapy Notes   Date: 04/16/2021  Time: 9:50 am   Location: Craft room   Behavioral response: Appropriate  Intervention Topic: Goals    Discussion/Intervention:  Group content on today was focused on goals. Patients described what goals are and how they define goals. Individuals expressed how they go about setting goals and reaching them. The group identified how important goals are and if they make short term goals to reach long term goals. Patients described how many goals they work on at a time and what affects them not reaching their goal. Individuals described how much time they put into planning and obtaining their goals. The group participated in the intervention "My Goal Board" and made personal goal boards to help them achieve their goal. Clinical Observations/Feedback: Patient came to group late and was focused on what peers and staff had to say about setting goals.Individual was social with staff and peers while participating in the intervention.  [name] LRT/CTRS         Kadin Bera 04/16/2021 11:45 AM

## 2021-04-16 NOTE — BH IP Treatment Plan (Signed)
Interdisciplinary Treatment and Diagnostic Plan Update  04/16/2021 Time of Session: 8:30AM [name] MRN: [id number]  Principal Diagnosis: Schizophrenia [facility])  Secondary Diagnoses: Principal Problem:   Schizophrenia (HCC) Active Problems:   Diabetes (HCC)   Hypothyroid   Hyperlipidemia   HTN (hypertension)   Current Medications:  Current Facility-Administered Medications  Medication Dose Route Frequency Provider Last Rate Last Admin   acetaminophen (TYLENOL) tablet 650 mg  650 mg Oral Q6H PRN Clapacs, John T, MD       alum & mag hydroxide-simeth (MAALOX/MYLANTA) 200-200-20 MG/5ML suspension 30 mL  30 mL Oral Q4H PRN Clapacs, John T, MD       aspirin EC tablet 81 mg  81 mg Oral Daily Clapacs, John T, MD   81 mg at 04/15/21 0809   benztropine (COGENTIN) tablet 1 mg  1 mg Oral BID WC Clapacs, John T, MD   1 mg at 04/15/21 2050   clonazePAM (KLONOPIN) tablet 0.5 mg  0.5 mg Oral BID WC Clapacs, John T, MD   0.5 mg at 04/16/21 3546   folic acid (FOLVITE) tablet 1 mg  1 mg Oral Daily Clapacs, John T, MD   1 mg at 04/15/21 0809   haloperidol (HALDOL) tablet 10 mg  10 mg Oral BID Jesse Sans, MD   10 mg at 04/16/21 5681   Or   haloperidol lactate (HALDOL) injection 10 mg  10 mg Intramuscular BID Jesse Sans, MD       haloperidol decanoate (HALDOL DECANOATE) 100 MG/ML injection 200 mg  200 mg Intramuscular Q30 days Jesse Sans, MD       hydrOXYzine (ATARAX/VISTARIL) tablet 50 mg  50 mg Oral TID PRN Clapacs, Jackquline Denmark, MD   50 mg at 04/15/21 0809   levothyroxine (SYNTHROID) tablet 125 mcg  125 mcg Oral Daily Jesse Sans, MD   125 mcg at 04/15/21 0810   magnesium hydroxide (MILK OF MAGNESIA) suspension 30 mL  30 mL Oral Daily PRN Clapacs, John T, MD       metFORMIN (GLUCOPHAGE) tablet 1,000 mg  1,000 mg Oral BID WC Clapacs, John T, MD   1,000 mg at 04/15/21 0810   OLANZapine zydis (ZYPREXA) disintegrating tablet 20 mg  20 mg Oral BID WC Jesse Sans, MD   20 mg at  04/16/21 2751   Or   OLANZapine (ZYPREXA) injection 20 mg  20 mg Intramuscular BID WC Jesse Sans, MD       simvastatin (ZOCOR) tablet 20 mg  20 mg Oral QHS Clapacs, John T, MD   20 mg at 04/15/21 2050   traZODone (DESYREL) tablet 150 mg  150 mg Oral QHS Jesse Sans, MD   150 mg at 04/15/21 2050   vitamin B-12 (CYANOCOBALAMIN) tablet 1,000 mcg  1,000 mcg Oral Daily Clapacs, Jackquline Denmark, MD   1,000 mcg at 04/15/21 0810   PTA Medications: Medications Prior to Admission  Medication Sig Dispense Refill Last Dose   aspirin EC 81 MG tablet Take 81 mg by mouth daily.      benztropine (COGENTIN) 1 MG tablet Take 1 tablet (1 mg total) by mouth 2 (two) times daily with a meal. 60 tablet 2    clonazePAM (KLONOPIN) 0.5 MG tablet Take 1 tablet (0.5 mg total) by mouth 2 (two) times daily with a meal. 60 tablet 0    divalproex (DEPAKOTE SPRINKLE) 125 MG capsule Take 8 capsules (1,000 mg total) by mouth 2 (two) times daily with a meal.  240 capsule 2    folic acid (FOLVITE) 1 MG tablet Take 1 mg by mouth daily.      haloperidol (HALDOL) 10 MG tablet Take 1.5 tablets (15 mg total) by mouth 2 (two) times daily with a meal. 90 tablet 2    levothyroxine (SYNTHROID, LEVOTHROID) 125 MCG tablet Take 1 tablet (125 mcg total) by mouth daily before breakfast. 30 tablet 0    metFORMIN (GLUCOPHAGE) 1000 MG tablet Take 1 tablet (1,000 mg total) by mouth 2 (two) times daily with a meal. 60 tablet 0    OLANZapine zydis (ZYPREXA) 20 MG disintegrating tablet Take 1 tablet (20 mg total) by mouth 2 (two) times daily with a meal. 60 tablet 2    simvastatin (ZOCOR) 20 MG tablet Take 20 mg by mouth at bedtime.      traZODone (DESYREL) 150 MG tablet Take 150 mg by mouth at bedtime.      vitamin B-12 (CYANOCOBALAMIN) 1000 MCG tablet Take 1,000 mcg by mouth daily.       Patient Stressors: Marital or family conflict   Medication change or noncompliance    Patient Strengths: Religious Affiliation  Supportive family/friends    Treatment Modalities: Medication Management, Group therapy, Case management,  1 to 1 session with clinician, Psychoeducation, Recreational therapy.   Physician Treatment Plan for Primary Diagnosis: Schizophrenia (HCC) Long Term Goal(s): Improvement in symptoms so as ready for discharge   Short Term Goals: Ability to identify changes in lifestyle to reduce recurrence of condition will improve Ability to verbalize feelings will improve Ability to disclose and discuss suicidal ideas Ability to demonstrate self-control will improve Ability to identify and develop effective coping behaviors will improve Ability to maintain clinical measurements within normal limits will improve Compliance with prescribed medications will improve Ability to identify triggers associated with substance abuse/mental health issues will improve  Medication Management: Evaluate patient's response, side effects, and tolerance of medication regimen.  Therapeutic Interventions: 1 to 1 sessions, Unit Group sessions and Medication administration.  Evaluation of Outcomes: Progressing  Physician Treatment Plan for Secondary Diagnosis: Principal Problem:   Schizophrenia (HCC) Active Problems:   Diabetes (HCC)   Hypothyroid   Hyperlipidemia   HTN (hypertension)  Long Term Goal(s): Improvement in symptoms so as ready for discharge   Short Term Goals: Ability to identify changes in lifestyle to reduce recurrence of condition will improve Ability to verbalize feelings will improve Ability to disclose and discuss suicidal ideas Ability to demonstrate self-control will improve Ability to identify and develop effective coping behaviors will improve Ability to maintain clinical measurements within normal limits will improve Compliance with prescribed medications will improve Ability to identify triggers associated with substance abuse/mental health issues will improve     Medication Management: Evaluate patient's  response, side effects, and tolerance of medication regimen.  Therapeutic Interventions: 1 to 1 sessions, Unit Group sessions and Medication administration.  Evaluation of Outcomes: Progressing   RN Treatment Plan for Primary Diagnosis: Schizophrenia (HCC) Long Term Goal(s): Knowledge of disease and therapeutic regimen to maintain health will improve  Short Term Goals: Ability to demonstrate self-control, Ability to participate in decision making will improve, Ability to verbalize feelings will improve, Ability to disclose and discuss suicidal ideas, Ability to identify and develop effective coping behaviors will improve, and Compliance with prescribed medications will improve  Medication Management: RN will administer medications as ordered by provider, will assess and evaluate patient's response and provide education to patient for prescribed medication. RN will report any adverse and/or  side effects to prescribing provider.  Therapeutic Interventions: 1 on 1 counseling sessions, Psychoeducation, Medication administration, Evaluate responses to treatment, Monitor vital signs and CBGs as ordered, Perform/monitor CIWA, COWS, AIMS and Fall Risk screenings as ordered, Perform wound care treatments as ordered.  Evaluation of Outcomes: Progressing   LCSW Treatment Plan for Primary Diagnosis: Schizophrenia (HCC) Long Term Goal(s): Safe transition to appropriate next level of care at discharge, Engage patient in therapeutic group addressing interpersonal concerns.  Short Term Goals: Engage patient in aftercare planning with referrals and resources, Increase social support, Increase ability to appropriately verbalize feelings, Increase emotional regulation, Facilitate acceptance of mental health diagnosis and concerns, and Increase skills for wellness and recovery  Therapeutic Interventions: Assess for all discharge needs, 1 to 1 time with Social worker, Explore available resources and support  systems, Assess for adequacy in community support network, Educate family and significant other(s) on suicide prevention, Complete Psychosocial Assessment, Interpersonal group therapy.  Evaluation of Outcomes: Progressing   Progress in Treatment: Attending groups: No. Participating in groups: No. Taking medication as prescribed: Yes. Toleration medication: Yes. Family/Significant other contact made: Yes, individual(s) contacted:  SPE completed with the patient's guardian.  Patient understands diagnosis: No. Discussing patient identified problems/goals with staff: Yes. Medical problems stabilized or resolved: Yes. Denies suicidal/homicidal ideation: Yes. Issues/concerns per patient self-inventory: No. Other: none  New problem(s) identified: No, Describe:  None   New Short Term/Long Term Goal(s): elimination of symptoms of psychosis, medication management for mood stabilization; development of comprehensive mental wellness plan.     Patient Goals:  Patient was unable to participate in treatment team due to his psychosis and has remained mainly non-verbal in his interactions with staff   Update 04/16/2021:  No changes at this time.    Discharge Plan or Barriers: CSW will assist pt with development of appropriate discharge/aftercare plan.  Update 04/16/2021:  CSW is waiting on paperwork from DSS to provide permission for CSW to speak with group home.  CSW has not received guardianship paperwork.   Reason for Continuation of Hospitalization: Delusions  Medication stabilization   Estimated Length of Stay: 1- 7 days   Update 04/16/2021: TBD   Scribe for Treatment Team: [name], LCSW 04/16/2021 9:29 AM

## 2021-04-16 NOTE — Progress Notes (Signed)
Patient calm during assessment but wouldn't answer questions directly. Patient presents with religiosity and responding to internal stimuli. Patient observed interacting appropriately with staff and peers on the unit. Patient compliant with medication administration per MD orders. Patient given support and encouragement to be active in his treatment plan. Pt being monitored Q 15 minutes for safety per unit protocol. Pt remains safe on the unit.

## 2021-04-16 NOTE — Group Note (Signed)
[facility] LCSW Group Therapy Note    Group Date: 04/16/2021 Start Time: 1300 End Time: 1400  Type of Therapy and Topic:  Group Therapy:  Overcoming Obstacles  Participation Level:  [facility] PARTICIPATION LEVEL: None  Mood:  Description of Group:   In this group patients will be encouraged to explore what they see as obstacles to their own wellness and recovery. They will be guided to discuss their thoughts, feelings, and behaviors related to these obstacles. The group will process together ways to cope with barriers, with attention given to specific choices patients can make. Each patient will be challenged to identify changes they are motivated to make in order to overcome their obstacles. This group will be process-oriented, with patients participating in exploration of their own experiences as well as giving and receiving support and challenge from other group members.  Therapeutic Goals: 1. Patient will identify personal and current obstacles as they relate to admission. 2. Patient will identify barriers that currently interfere with their wellness or overcoming obstacles.  3. Patient will identify feelings, thought process and behaviors related to these barriers. 4. Patient will identify two changes they are willing to make to overcome these obstacles:    Summary of Patient Progress Patient attended group, however, did not participate in group.    Therapeutic Modalities:   Cognitive Behavioral Therapy Solution Focused Therapy Motivational Interviewing Relapse Prevention Therapy   [name], LCSW

## 2021-04-16 NOTE — Progress Notes (Signed)
Patient has been quiet and pleasant. Took his psychiatric medications at bedtime but refused medications this morning. Denies SI, HI and AVH but still internally and religiously preoccupied

## 2021-04-16 NOTE — Plan of Care (Signed)
  Problem: Education: Goal: Knowledge of [place] [facility] Education information/materials will improve Outcome: Progressing Goal: Emotional status will improve Outcome: Progressing Goal: Mental status will improve Outcome: Progressing Goal: Verbalization of understanding the information provided will improve Outcome: Progressing   Problem: Safety: Goal: Periods of time without injury will increase Outcome: Progressing   Problem: Education: Goal: Will be free of psychotic symptoms Outcome: Progressing Goal: Knowledge of the prescribed therapeutic regimen will improve Outcome: Progressing   Problem: Self-Concept: Goal: Will verbalize positive feelings about self Outcome: Progressing   

## 2021-04-16 NOTE — Progress Notes (Signed)
Pt has been calm and cooperative. Today.he has been more organized with his thoughts.He has attended group and been more assertive. He still questions about his meds "I feel fine" but takes his psych meds. Torrie Mayers RN

## 2021-04-16 NOTE — Progress Notes (Signed)
[facility] MD Progress Note  04/16/2021 10:01 AM [name]  MRN:  [id number]  CC Follow-up for patient with schizophrenia  Subjective:   65 year old male with schizophrenia brought to emergency room by group home for increasing agitation and medication non-compliance. No acute events overnight, only partially medication compliant, ADLS improving. Patient seen one-on-one this morning. He is lying in bed, but awake and alert. He responds to questions today, however, answers unrelated to questions asked and hyperreligious in nature. Remains psychotic at this time.   Principal Problem: Schizophrenia (HCC) Diagnosis: Principal Problem:   Schizophrenia (HCC) Active Problems:   Diabetes (HCC)   Hypothyroid   Hyperlipidemia   HTN (hypertension)  Total Time spent with patient: 30 minutes  Past Psychiatric History: See H&P  Past Medical History:  Past Medical History:  Diagnosis Date   Diabetes mellitus without complication (HCC)    HTN (hypertension)    Hyperammonemia (HCC) While on Depakote   Hyperlipemia    Lithium toxicity    Neutropenia (HCC)    Schizophrenia (HCC)    Seizure (HCC) While toxic on Depakote   Sickle cell trait (HCC)    Thyroid disease    TIA (transient ischemic attack) in 2009   History reviewed. No pertinent surgical history. Family History: History reviewed. No pertinent family history. Family Psychiatric  History: See H&P Social History:  Social History   Substance and Sexual Activity  Alcohol Use No     Social History   Substance and Sexual Activity  Drug Use Not Currently    Social History   Socioeconomic History   Marital status: Single    Spouse name: Not on file   Number of children: Not on file   Years of education: Not on file   Highest education level: Not on file  Occupational History   Not on file  Tobacco Use   Smoking status: Never   Smokeless tobacco: Never  Vaping Use   Vaping Use: Never used  Substance and Sexual Activity    Alcohol use: No   Drug use: Not Currently   Sexual activity: Not Currently  Other Topics Concern   Not on file  Social History Narrative   Not on file   Social Determinants of Health   Financial Resource Strain: Not on file  Food Insecurity: Not on file  Transportation Needs: Not on file  Physical Activity: Not on file  Stress: Not on file  Social Connections: Not on file   Additional Social History:                         Sleep: Fair  Appetite:  Fair  Current Medications: Current Facility-Administered Medications  Medication Dose Route Frequency Provider Last Rate Last Admin   acetaminophen (TYLENOL) tablet 650 mg  650 mg Oral Q6H PRN Clapacs, John T, MD       alum & mag hydroxide-simeth (MAALOX/MYLANTA) 200-200-20 MG/5ML suspension 30 mL  30 mL Oral Q4H PRN Clapacs, John T, MD       aspirin EC tablet 81 mg  81 mg Oral Daily Clapacs, John T, MD   81 mg at 04/15/21 0809   benztropine (COGENTIN) tablet 1 mg  1 mg Oral BID WC Clapacs, John T, MD   1 mg at 04/16/21 0910   clonazePAM (KLONOPIN) tablet 0.5 mg  0.5 mg Oral BID WC Clapacs, John T, MD   0.5 mg at 04/16/21 0909   folic acid (FOLVITE) tablet 1 mg  1  mg Oral Daily Clapacs, John T, MD   1 mg at 04/15/21 0809   haloperidol (HALDOL) tablet 10 mg  10 mg Oral BID Jesse Sans, MD   10 mg at 04/16/21 3875   Or   haloperidol lactate (HALDOL) injection 10 mg  10 mg Intramuscular BID Jesse Sans, MD       haloperidol decanoate (HALDOL DECANOATE) 100 MG/ML injection 200 mg  200 mg Intramuscular Q30 days Jesse Sans, MD       hydrOXYzine (ATARAX/VISTARIL) tablet 50 mg  50 mg Oral TID PRN Clapacs, Jackquline Denmark, MD   50 mg at 04/15/21 0809   levothyroxine (SYNTHROID) tablet 125 mcg  125 mcg Oral Daily Jesse Sans, MD   125 mcg at 04/15/21 0810   magnesium hydroxide (MILK OF MAGNESIA) suspension 30 mL  30 mL Oral Daily PRN Clapacs, Jackquline Denmark, MD       metFORMIN (GLUCOPHAGE) tablet 1,000 mg  1,000 mg Oral BID WC  Clapacs, John T, MD   1,000 mg at 04/15/21 0810   OLANZapine zydis (ZYPREXA) disintegrating tablet 20 mg  20 mg Oral BID WC Jesse Sans, MD   20 mg at 04/16/21 6433   Or   OLANZapine (ZYPREXA) injection 20 mg  20 mg Intramuscular BID WC Jesse Sans, MD       simvastatin (ZOCOR) tablet 20 mg  20 mg Oral QHS Clapacs, John T, MD   20 mg at 04/15/21 2050   traZODone (DESYREL) tablet 150 mg  150 mg Oral QHS Jesse Sans, MD   150 mg at 04/15/21 2050   vitamin B-12 (CYANOCOBALAMIN) tablet 1,000 mcg  1,000 mcg Oral Daily Clapacs, Jackquline Denmark, MD   1,000 mcg at 04/15/21 2951    Lab Results: No results found for this or any previous visit (from the past 48 hour(s)).  Blood Alcohol level:  Lab Results  Component Value Date   ETH <10 04/08/2021   ETH <10 09/08/2019    Metabolic Disorder Labs: Lab Results  Component Value Date   HGBA1C 6.0 (H) 04/08/2021   MPG 126 04/08/2021   MPG 122.63 08/19/2019   No results found for: PROLACTIN Lab Results  Component Value Date   CHOL 156 04/08/2021   TRIG 66 04/08/2021   HDL 41 04/08/2021   CHOLHDL 3.8 04/08/2021   VLDL 13 04/08/2021   LDLCALC 102 (H) 04/08/2021    Physical Findings: AIMS:  , ,  ,  ,    CIWA:    COWS:     Musculoskeletal: Strength & Muscle Tone: within normal limits Gait & Station: normal Patient leans: N/A  Psychiatric Specialty Exam:  Presentation  General Appearance: Disheveled  Eye Contact:Fair Speech:Garbled, pressured Speech Volume:Decreased Handedness:Right   Mood and Affect  Mood:Irritable  Affect:Flat   Thought Process  Thought Processes:Unknown Descriptions of Associations:Unknown Orientation:Unknown Thought Content:Unknown History of Schizophrenia/Schizoaffective disorder:Yes  Duration of Psychotic Symptoms:Less than six months  Hallucinations:Unknown, seen responding to internal stimuli  Ideas of Reference:Paranoia  Suicidal Thoughts:Denies  Homicidal  Thoughts:Denies   Sensorium  Memory:Immediate Poor; Recent Poor; Remote Poor  Judgment:Impaired  Insight:Lacking   Executive Functions  Concentration:Poor  Attention Span:Poor  Recall:Poor  Fund of Knowledge:Poor  Language:Poor   Psychomotor Activity  Psychomotor Activity:TD present   Assets  Assets:Desire for Improvement; Financial Resources/Insurance; Housing; Resilience   Sleep  Sleep:Fair, 7 hours    Physical Exam: Physical Exam ROS Blood pressure (!) 148/84, pulse 90, temperature 98 F (36.7 C),  temperature source Oral, resp. rate 18, height 6\' 3"  (1.905 m), weight 101 kg, SpO2 99 %. Body mass index is 27.83 kg/m.   Treatment Plan Summary: Daily contact with patient to assess and evaluate symptoms and progress in treatment and Medication management 65 year old male presenting with psychosis and increased aggression in the context of medication noncompliance. Continues to intermittently refuse medications, acutely psychotic and unable to care for self.    Schizophrenia - Continue Haldol 10 mg BID PO or IM in refuses,  Zyprexa 20 mg BID PO or IM if refuses , cogentin 1 mg BID - Patient continues to refuse Haldol LAI - He has previously failed monotherapy with Haldol, Zyprexa, Risperdal, and Prolixin   History of seizures/Anxiety - Continue klonopin 0.5 mg BID   Hypothyroidism - Synthroid 125 mcg daily    HTN - Lisinopril 2.5 mg daily    Diabetes Mellitus, Type 2 - Metformin 1000 mg BID    HLD- well controlled - Simvastatin 20 mg QHS, lipid panel 04/08/21 WNL aside from LDL minutely increased at 06/08/21.    Vitamin B12 deficiency - Cyanocobalamin 1000 mcg daily   04/16/21: Psychiatric exam above reviewed and remains accurate. Assessment and plan above reviewed and updated.      06/16/21, MD 04/16/2021, 10:01 AM

## 2021-04-16 NOTE — Plan of Care (Signed)
Pt denies depression, anxiety, SI, HI and AVH. He states "I am not angry." He seems less disorganized. He answers questions and even made comments to me. Pt was educated on care plan and verbalizes understanding. Pt took his pych meds. Torrie Mayers RN  Problem: Education: Goal: Knowledge of [place] [facility] Education information/materials will improve Outcome: Progressing Goal: Emotional status will improve Outcome: Progressing Goal: Mental status will improve Outcome: Progressing Goal: Verbalization of understanding the information provided will improve Outcome: Progressing   Problem: Safety: Goal: Periods of time without injury will increase Outcome: Progressing   Problem: Education: Goal: Will be free of psychotic symptoms Outcome: Not Progressing Goal: Knowledge of the prescribed therapeutic regimen will improve Outcome: Progressing   Problem: Self-Concept: Goal: Will verbalize positive feelings about self Outcome: Progressing

## 2021-04-17 NOTE — Progress Notes (Signed)
With encouragement, patient took oral Haldol and Zyprexa medications. Patient was educated on medications and verbalized understanding.

## 2021-04-17 NOTE — Group Note (Signed)
[facility] LCSW Group Therapy Note   Group Date: 04/17/2021 Start Time: 1300 End Time: 1355  Type of Therapy/Topic:  Group Therapy:  Feelings about Diagnosis  Participation Level:  None   Description of Group:    This group will allow patients to explore their thoughts and feelings about diagnoses they have received. Patients will be guided to explore their level of understanding and acceptance of these diagnoses. Facilitator will encourage patients to process their thoughts and feelings about the reactions of others to their diagnosis, and will guide patients in identifying ways to discuss their diagnosis with significant others in their lives. This group will be process-oriented, with patients participating in exploration of their own experiences as well as giving and receiving support and challenge from other group members.   Therapeutic Goals: 1. Patient will demonstrate understanding of diagnosis as evidence by identifying two or more symptoms of the disorder:  2. Patient will be able to express two feelings regarding the diagnosis 3. Patient will demonstrate ability to communicate their needs through discussion and/or role plays  Summary of Patient Progress: Pt was present for the entirety of group. Outside of asking facilitator the time towards the end of group, pt was not involved in the discussion and it is questionable whether he was listening.   Therapeutic Modalities:   Cognitive Behavioral Therapy Brief Therapy Feelings Identification    Glenis Smoker, LCSW

## 2021-04-17 NOTE — Progress Notes (Signed)
[facility] MD Progress Note  04/17/2021 10:02 AM [name]  MRN:  [id number]  CC "I'm okay"  Subjective:   65 year old male with schizophrenia brought to emergency room by group home for increasing agitation and medication non-compliance. No acute events overnight, medication compliant, ADLs intact.  Patient seen one-on-one this morning. He states he is feeling okay today. Denies SI/HI/AH/VH. Answers to questions more coherent albeit somewhat hyper religious. Patient is approaching baseline. Team attempting to reach guardian to discuss return to group home.   Principal Problem: Schizophrenia (HCC) Diagnosis: Principal Problem:   Schizophrenia (HCC) Active Problems:   Diabetes (HCC)   Hypothyroid   Hyperlipidemia   HTN (hypertension)  Total Time spent with patient: 30 minutes  Past Psychiatric History: See H&P  Past Medical History:  Past Medical History:  Diagnosis Date   Diabetes mellitus without complication (HCC)    HTN (hypertension)    Hyperammonemia (HCC) While on Depakote   Hyperlipemia    Lithium toxicity    Neutropenia (HCC)    Schizophrenia (HCC)    Seizure (HCC) While toxic on Depakote   Sickle cell trait (HCC)    Thyroid disease    TIA (transient ischemic attack) in 2009   History reviewed. No pertinent surgical history. Family History: History reviewed. No pertinent family history. Family Psychiatric  History: See H&P Social History:  Social History   Substance and Sexual Activity  Alcohol Use No     Social History   Substance and Sexual Activity  Drug Use Not Currently    Social History   Socioeconomic History   Marital status: Single    Spouse name: Not on file   Number of children: Not on file   Years of education: Not on file   Highest education level: Not on file  Occupational History   Not on file  Tobacco Use   Smoking status: Never   Smokeless tobacco: Never  Vaping Use   Vaping Use: Never used  Substance and Sexual Activity   Alcohol  use: No   Drug use: Not Currently   Sexual activity: Not Currently  Other Topics Concern   Not on file  Social History Narrative   Not on file   Social Determinants of Health   Financial Resource Strain: Not on file  Food Insecurity: Not on file  Transportation Needs: Not on file  Physical Activity: Not on file  Stress: Not on file  Social Connections: Not on file   Additional Social History:                         Sleep: Fair  Appetite:  Fair  Current Medications: Current Facility-Administered Medications  Medication Dose Route Frequency Provider Last Rate Last Admin   acetaminophen (TYLENOL) tablet 650 mg  650 mg Oral Q6H PRN Clapacs, John T, MD       alum & mag hydroxide-simeth (MAALOX/MYLANTA) 200-200-20 MG/5ML suspension 30 mL  30 mL Oral Q4H PRN Clapacs, Jackquline Denmark, MD       aspirin EC tablet 81 mg  81 mg Oral Daily Clapacs, John T, MD   81 mg at 04/15/21 0809   benztropine (COGENTIN) tablet 1 mg  1 mg Oral BID WC Clapacs, John T, MD   1 mg at 04/16/21 1724   clonazePAM (KLONOPIN) tablet 0.5 mg  0.5 mg Oral BID WC Clapacs, John T, MD   0.5 mg at 04/16/21 1723   folic acid (FOLVITE) tablet 1 mg  1  mg Oral Daily Clapacs, Jackquline Denmark, MD   1 mg at 04/15/21 0809   haloperidol (HALDOL) tablet 10 mg  10 mg Oral BID Jesse Sans, MD   10 mg at 04/17/21 9379   Or   haloperidol lactate (HALDOL) injection 10 mg  10 mg Intramuscular BID Jesse Sans, MD       haloperidol decanoate (HALDOL DECANOATE) 100 MG/ML injection 200 mg  200 mg Intramuscular Q30 days Jesse Sans, MD       hydrOXYzine (ATARAX/VISTARIL) tablet 50 mg  50 mg Oral TID PRN Clapacs, Jackquline Denmark, MD   50 mg at 04/15/21 0809   levothyroxine (SYNTHROID) tablet 125 mcg  125 mcg Oral Daily Jesse Sans, MD   125 mcg at 04/17/21 0558   magnesium hydroxide (MILK OF MAGNESIA) suspension 30 mL  30 mL Oral Daily PRN Clapacs, Jackquline Denmark, MD       metFORMIN (GLUCOPHAGE) tablet 1,000 mg  1,000 mg Oral BID WC Clapacs,  John T, MD   1,000 mg at 04/15/21 0810   OLANZapine zydis (ZYPREXA) disintegrating tablet 20 mg  20 mg Oral BID WC Jesse Sans, MD   20 mg at 04/17/21 0558   Or   OLANZapine (ZYPREXA) injection 20 mg  20 mg Intramuscular BID WC Jesse Sans, MD       simvastatin (ZOCOR) tablet 20 mg  20 mg Oral QHS Clapacs, John T, MD   20 mg at 04/16/21 2105   traZODone (DESYREL) tablet 150 mg  150 mg Oral QHS Jesse Sans, MD   150 mg at 04/16/21 2105   vitamin B-12 (CYANOCOBALAMIN) tablet 1,000 mcg  1,000 mcg Oral Daily Clapacs, Jackquline Denmark, MD   1,000 mcg at 04/15/21 0240    Lab Results: No results found for this or any previous visit (from the past 48 hour(s)).  Blood Alcohol level:  Lab Results  Component Value Date   ETH <10 04/08/2021   ETH <10 09/08/2019    Metabolic Disorder Labs: Lab Results  Component Value Date   HGBA1C 6.0 (H) 04/08/2021   MPG 126 04/08/2021   MPG 122.63 08/19/2019   No results found for: PROLACTIN Lab Results  Component Value Date   CHOL 156 04/08/2021   TRIG 66 04/08/2021   HDL 41 04/08/2021   CHOLHDL 3.8 04/08/2021   VLDL 13 04/08/2021   LDLCALC 102 (H) 04/08/2021    Physical Findings: AIMS:  , ,  ,  ,    CIWA:    COWS:     Musculoskeletal: Strength & Muscle Tone: within normal limits Gait & Station: normal Patient leans: N/A  Psychiatric Specialty Exam:  Presentation  General Appearance: Casual Eye Contact:Fair Speech:Gabled, normal rate Speech Volume:Decreased Handedness:Right   Mood and Affect  Mood:Euthymic Affect:Flat   Thought Process  Thought Processes:More linear Descriptions of Associations:More intact Orientation:AAOx4 Thought Content: Religious preoccupation History of Schizophrenia/Schizoaffective disorder:Yes  Duration of Psychotic Symptoms:Less than six months  Hallucinations:Denies  Ideas of Reference:Paranoia  Suicidal Thoughts:Denies  Homicidal Thoughts:Denies   Sensorium  Memory:Immediate Poor;  Recent Poor; Remote Poor  Judgment:Intact Insight:Present  Executive Functions  Concentration:Fair Attention Span:Fair Recall:Fair Fund of Knowledge:Fair Language:Fair  Psychomotor Activity  Psychomotor Activity:TD present   Assets  Assets:Desire for Improvement; Financial Resources/Insurance; Housing; Resilience   Sleep  Sleep:Fair, 7 hours    Physical Exam: Physical Exam ROS Blood pressure (!) 148/84, pulse 90, temperature 98 F (36.7 C), temperature source Oral, resp. rate 18, height 6\' 3"  (  1.905 m), weight 101 kg, SpO2 99 %. Body mass index is 27.83 kg/m.   Treatment Plan Summary: Daily contact with patient to assess and evaluate symptoms and progress in treatment and Medication management 65 year old male presenting with psychosis and increased aggression in the context of medication noncompliance. Patient taking medications more consistently, and appears to be approaching baseline.    Schizophrenia - Continue Haldol 10 mg BID PO or IM in refuses,  Zyprexa 20 mg BID PO or IM if refuses , cogentin 1 mg BID - Patient continues to refuse Haldol LAI - He has previously failed monotherapy with Haldol, Zyprexa, Risperdal, and Prolixin   History of seizures/Anxiety - Continue klonopin 0.5 mg BID   Hypothyroidism - Synthroid 125 mcg daily    HTN - Lisinopril 2.5 mg daily    Diabetes Mellitus, Type 2 - Metformin 1000 mg BID    HLD- well controlled - Simvastatin 20 mg QHS, lipid panel 04/08/21 WNL aside from LDL minutely increased at 371.    Vitamin B12 deficiency - Cyanocobalamin 1000 mcg daily   04/17/21: Psychiatric exam above reviewed and remains accurate. Assessment and plan above reviewed and updated.    Jesse Sans, MD 04/17/2021, 10:02 AM

## 2021-04-17 NOTE — Progress Notes (Signed)
Patient will not look at this writer or answer assessment questions. Patient was asked to come to medication room for 8am medications. Patient does not respond. Medications were brought to the patient, but patient continued to look away and did not respond or take 8am medications. Patient appears internally preoccupied. Patient remains safe on the unit at this time.

## 2021-04-17 NOTE — BHH Counselor (Signed)
CSW attempted to speak with the pt's guardian, Mee Hives, [place] DSS, [phone].  CSW left HIPAA compliant voicemail.  Penni Homans, MSW, LCSW 04/17/2021 10:13 AM

## 2021-04-17 NOTE — Progress Notes (Signed)
Recreation Therapy Notes   Date: 04/17/2021  Time: 10:00 am   Location: Craft room    Behavioral response: N/A   Intervention Topic: Problem-Solving  Discussion/Intervention: Patient did not attend group.   Clinical Observations/Feedback:  Patient did not attend group.   Kadee Philyaw LRT/CTRS         Wen Merced 04/17/2021 11:44 AM

## 2021-04-18 NOTE — Plan of Care (Signed)
Continues to experience disorganized thinking: delusional, preoccupied. Has been in bed. Appears disheveled, unkept. Did not sound to comprehend  during this assessment. Patient took medication upon multiple attempt. Nursing team continue to monitor for safety and other needs.

## 2021-04-18 NOTE — Progress Notes (Signed)
Recreation Therapy Notes   Date: 04/18/2021  Time: 9:50 am   Location: Courtyard   Behavioral response: N/A   Intervention Topic: Leisure  Discussion/Intervention: Patient did not attend group.   Clinical Observations/Feedback:  Patient did not attend group.   Mercadies Co LRT/CTRS        Niomie Englert 04/18/2021 11:25 AM

## 2021-04-18 NOTE — Progress Notes (Signed)
Patient was more and more visible in the milieu toward the afternoon, animated. Has stayed in the dayroom with peers without any sign of distress.

## 2021-04-18 NOTE — Plan of Care (Signed)
  Problem: Education: Goal: Emotional status will improve Outcome: Progressing Goal: Mental status will improve Outcome: Progressing Goal: Verbalization of understanding the information provided will improve Outcome: Progressing   Problem: Safety: Goal: Periods of time without injury will increase Outcome: Progressing   

## 2021-04-18 NOTE — Progress Notes (Signed)
Patient in room awake. Alert but disoriented to situations. Guarded with bizarre behaviors: starring and glaring with disorganized thinking. Refused vital signs and medications: "I am alright". Staff continue to provide support and encouragements.

## 2021-04-18 NOTE — Progress Notes (Signed)
Patient noted in dayroom playing cards with peers. Pleasant and cooperative. Medication compliant. Encouragement and support provided, safety checks maintained. Medications given as prescribed. Pt remains safe on unit with q 15 min checks.

## 2021-04-18 NOTE — Group Note (Signed)
[facility] LCSW Group Therapy Note   Group Date: 04/18/2021 Start Time: 1300 End Time: 1400   Type of Therapy/Topic:  Group Therapy:  Emotion Regulation  Participation Level:  Active     Description of Group:    The purpose of this group is to assist patients in learning to regulate negative emotions and experience positive emotions. Patients will be guided to discuss ways in which they have been vulnerable to their negative emotions. These vulnerabilities will be juxtaposed with experiences of positive emotions or situations, and patients challenged to use positive emotions to combat negative ones. Special emphasis will be placed on coping with negative emotions in conflict situations, and patients will process healthy conflict resolution skills.  Therapeutic Goals: Patient will identify two positive emotions or experiences to reflect on in order to balance out negative emotions:  Patient will label two or more emotions that they find the most difficult to experience:  Patient will be able to demonstrate positive conflict resolution skills through discussion or role plays:   Summary of Patient Progress:   Patient was present for the entirety of the group session. Patient was an active listener, had a pleasant demeanor, and participated, but his contributions were not on topic. Patient made statements that were related to basketball and Bible scriptures. He was unable to stay on topic when asked direct questions as well.      Therapeutic Modalities:   Cognitive Behavioral Therapy Feelings Identification Dialectical Behavioral Therapy   Raquell Richer A [place], LCSWA

## 2021-04-18 NOTE — Progress Notes (Signed)
[facility] MD Progress Note  04/18/2021 10:09 AM [name]  MRN:  [id number]  CC "I'm okay"  Subjective:   65 year old male with schizophrenia brought to emergency room by group home for increasing agitation and medication non-compliance. No acute events overnight, medication compliant, ADLs intact.  Patient seen one-on-one today and states he is "okay." Denies SI/HI/AH/VH. Patient appears to be nearing his baseline. However, his baseline is such he is unable to adequately care for himself on his own. He has previously been declared incompetent by the courts, and has a legal guardian. He requires 24 hour supervision. Guardian has been called numerous times, voicemails left, and e-mail also sent to begin discharge planning. Seeking approval to discharge back to prior group home.   Principal Problem: Schizophrenia (HCC) Diagnosis: Principal Problem:   Schizophrenia (HCC) Active Problems:   Diabetes (HCC)   Hypothyroid   Hyperlipidemia   HTN (hypertension)  Total Time spent with patient: 20 minutes  Past Psychiatric History: See H&P  Past Medical History:  Past Medical History:  Diagnosis Date   Diabetes mellitus without complication (HCC)    HTN (hypertension)    Hyperammonemia (HCC) While on Depakote   Hyperlipemia    Lithium toxicity    Neutropenia (HCC)    Schizophrenia (HCC)    Seizure (HCC) While toxic on Depakote   Sickle cell trait (HCC)    Thyroid disease    TIA (transient ischemic attack) in 2009   History reviewed. No pertinent surgical history. Family History: History reviewed. No pertinent family history. Family Psychiatric  History: See H&P Social History:  Social History   Substance and Sexual Activity  Alcohol Use No     Social History   Substance and Sexual Activity  Drug Use Not Currently    Social History   Socioeconomic History   Marital status: Single    Spouse name: Not on file   Number of children: Not on file   Years of education: Not on file    Highest education level: Not on file  Occupational History   Not on file  Tobacco Use   Smoking status: Never   Smokeless tobacco: Never  Vaping Use   Vaping Use: Never used  Substance and Sexual Activity   Alcohol use: No   Drug use: Not Currently   Sexual activity: Not Currently  Other Topics Concern   Not on file  Social History Narrative   Not on file   Social Determinants of Health   Financial Resource Strain: Not on file  Food Insecurity: Not on file  Transportation Needs: Not on file  Physical Activity: Not on file  Stress: Not on file  Social Connections: Not on file   Additional Social History:                         Sleep: Fair  Appetite:  Fair  Current Medications: Current Facility-Administered Medications  Medication Dose Route Frequency Provider Last Rate Last Admin   acetaminophen (TYLENOL) tablet 650 mg  650 mg Oral Q6H PRN Clapacs, John T, MD       alum & mag hydroxide-simeth (MAALOX/MYLANTA) 200-200-20 MG/5ML suspension 30 mL  30 mL Oral Q4H PRN Clapacs, Jackquline Denmark, MD       aspirin EC tablet 81 mg  81 mg Oral Daily Clapacs, Jackquline Denmark, MD   81 mg at 04/18/21 8841   benztropine (COGENTIN) tablet 1 mg  1 mg Oral BID WC Clapacs, Jackquline Denmark, MD  1 mg at 04/18/21 5427   clonazePAM (KLONOPIN) tablet 0.5 mg  0.5 mg Oral BID WC Clapacs, John T, MD   0.5 mg at 04/18/21 0623   folic acid (FOLVITE) tablet 1 mg  1 mg Oral Daily Clapacs, John T, MD   1 mg at 04/18/21 7628   haloperidol (HALDOL) tablet 10 mg  10 mg Oral BID Jesse Sans, MD   10 mg at 04/18/21 3151   Or   haloperidol lactate (HALDOL) injection 10 mg  10 mg Intramuscular BID Jesse Sans, MD       haloperidol decanoate (HALDOL DECANOATE) 100 MG/ML injection 200 mg  200 mg Intramuscular Q30 days Jesse Sans, MD       hydrOXYzine (ATARAX/VISTARIL) tablet 50 mg  50 mg Oral TID PRN Clapacs, Jackquline Denmark, MD   50 mg at 04/15/21 0809   levothyroxine (SYNTHROID) tablet 125 mcg  125 mcg Oral Daily  Jesse Sans, MD   125 mcg at 04/18/21 7616   magnesium hydroxide (MILK OF MAGNESIA) suspension 30 mL  30 mL Oral Daily PRN Clapacs, John T, MD       metFORMIN (GLUCOPHAGE) tablet 1,000 mg  1,000 mg Oral BID WC Clapacs, John T, MD   1,000 mg at 04/18/21 0922   OLANZapine zydis (ZYPREXA) disintegrating tablet 20 mg  20 mg Oral BID WC Jesse Sans, MD   20 mg at 04/18/21 0737   Or   OLANZapine (ZYPREXA) injection 20 mg  20 mg Intramuscular BID WC Jesse Sans, MD       simvastatin (ZOCOR) tablet 20 mg  20 mg Oral QHS Clapacs, Jackquline Denmark, MD   20 mg at 04/17/21 2044   traZODone (DESYREL) tablet 150 mg  150 mg Oral QHS Jesse Sans, MD   150 mg at 04/17/21 2045   vitamin B-12 (CYANOCOBALAMIN) tablet 1,000 mcg  1,000 mcg Oral Daily Clapacs, Jackquline Denmark, MD   1,000 mcg at 04/18/21 1062    Lab Results: No results found for this or any previous visit (from the past 48 hour(s)).  Blood Alcohol level:  Lab Results  Component Value Date   ETH <10 04/08/2021   ETH <10 09/08/2019    Metabolic Disorder Labs: Lab Results  Component Value Date   HGBA1C 6.0 (H) 04/08/2021   MPG 126 04/08/2021   MPG 122.63 08/19/2019   No results found for: PROLACTIN Lab Results  Component Value Date   CHOL 156 04/08/2021   TRIG 66 04/08/2021   HDL 41 04/08/2021   CHOLHDL 3.8 04/08/2021   VLDL 13 04/08/2021   LDLCALC 102 (H) 04/08/2021    Physical Findings: AIMS:  , ,  ,  ,    CIWA:    COWS:     Musculoskeletal: Strength & Muscle Tone: within normal limits Gait & Station: normal Patient leans: N/A  Psychiatric Specialty Exam:  Presentation  General Appearance: Casual Eye Contact:Fair Speech:Gabled, normal rate Speech Volume:Decreased Handedness:Right   Mood and Affect  Mood:Euthymic Affect:Flat   Thought Process  Thought Processes:More linear Descriptions of Associations:More intact Orientation:AAOx4 Thought Content: Religious preoccupation History of  Schizophrenia/Schizoaffective disorder:Yes  Duration of Psychotic Symptoms:Less than six months  Hallucinations:Denies  Ideas of Reference:Paranoia  Suicidal Thoughts:Denies  Homicidal Thoughts:Denies   Sensorium  Memory:Immediate Poor; Recent Poor; Remote Poor  Judgment:Intact Insight:Present  Executive Functions  Concentration:Fair Attention Span:Fair Recall:Fair Fund of Knowledge:Fair Language:Fair  Psychomotor Activity  Psychomotor Activity:TD present   Assets  Assets:Desire for Improvement;  Financial Resources/Insurance; Housing; Resilience   Sleep  Sleep:Fair, 6.5 hours    Physical Exam: Physical Exam ROS Blood pressure (!) 148/84, pulse 90, temperature 98 F (36.7 C), temperature source Oral, resp. rate 18, height 6\' 3"  (1.905 m), weight 101 kg, SpO2 99 %. Body mass index is 27.83 kg/m.   Treatment Plan Summary: Daily contact with patient to assess and evaluate symptoms and progress in treatment and Medication management 65 year old male presenting with psychosis and increased aggression in the context of medication noncompliance. Patient appears to be nearing his baseline. However, his baseline is such he is unable to adequately care for himself on his own. He has previously been declared incompetent by the courts, and has a legal guardian. He requires 24 hour supervision. Guardian has been called numerous times, voicemails left, and e-mail also sent to begin discharge planning. Seeking approval to discharge back to prior group home.    Schizophrenia - Continue Haldol 10 mg BID PO or IM in refuses,  Zyprexa 20 mg BID PO or IM if refuses , cogentin 1 mg BID - Patient continues to refuse Haldol LAI - He has previously failed monotherapy with Haldol, Zyprexa, Risperdal, and Prolixin   History of seizures/Anxiety - Continue klonopin 0.5 mg BID   Hypothyroidism - Synthroid 125 mcg daily    HTN - Lisinopril 2.5 mg daily    Diabetes Mellitus, Type 2 -  Metformin 1000 mg BID    HLD- well controlled - Simvastatin 20 mg QHS, lipid panel 04/08/21 WNL aside from LDL minutely increased at 06/08/21.    Vitamin B12 deficiency - Cyanocobalamin 1000 mcg daily   04/18/21: Psychiatric exam above reviewed and remains accurate. Assessment and plan above reviewed and updated.     06/18/21, MD 04/18/2021, 10:09 AM

## 2021-04-19 NOTE — BHH Counselor (Signed)
CSW spoke with the [name] at [facility], [phone].  CSW explained that she has been trying to reach DSS for the week.  [name] reports that the staff member that CSW has been reaching out to has been busy and she is the point person for this patient.  CSW explained that when she met with the previous DSS SW this was not explained and he asked for contact to be through him.   CSW informed that patient is ready to be discharged at this time and CSW is in need of guardianship documentation as well as return of the signed releases for consent to speak with group home.  CSW obtained verbal permission to speak with [name], group home owner, [phone].  [name] expressed concerns of pt bing at his baseline.  CSW explained that per conversation with Bruce at [place] in the previous week, the patient was at his baseline. [name] expressed that he was not due to aggressive behaviors and not taking his medications.  CSW explained that per review of pt's medications he is taking his medications.  CSW further explained that patient has not been aggressive and has been attending groups.  CSW attempted to call group home and left HIPAA compliant voicemail.  Assunta Curtis, MSW, LCSW 04/19/2021 10:57 AM

## 2021-04-19 NOTE — BHH Counselor (Signed)
CSW spoke with Jimmye Norman.  She confirms that the patient CAN return to the group home.  She reports that the patient pharmacy is [facility] in [place].   She reports that group home staff will pick up the patient around 11 or 11:30 on 04/20/2021.  She requested a FL2 be completed.   Penni Homans, MSW, LCSW 04/19/2021 12:52 PM

## 2021-04-19 NOTE — Progress Notes (Signed)
Patient is active on the unit playing cards and watching tv with his peers.  His speech is more clear and his thoughts are logical and coherent. He is med compliant and received medication without incident. Will continue monitor with Q 15 minute safety checks.   Cleo Butler-Nicholson, LPN

## 2021-04-19 NOTE — Group Note (Signed)
LCSW Group Therapy Note   Group Date: 04/19/2021 Start Time: 1300 End Time: 1400   Type of Therapy and Topic:  Group Therapy: Boundaries  Participation Level:  Active  Description of Group: This group will address the use of boundaries in their personal lives. Patients will explore why boundaries are important, the difference between healthy and unhealthy boundaries, and negative and postive outcomes of different boundaries and will look at how boundaries can be crossed.  Patients will be encouraged to identify current boundaries in their own lives and identify what kind of boundary is being set. Facilitators will guide patients in utilizing problem-solving interventions to address and correct types boundaries being used and to address when no boundary is being used. Understanding and applying boundaries will be explored and addressed for obtaining and maintaining a balanced life. Patients will be encouraged to explore ways to assertively make their boundaries and needs known to significant others in their lives, using other group members and facilitator for role play, support, and feedback.  Therapeutic Goals:  1.  Patient will identify areas in their life where setting clear boundaries could be  used to improve their life.  2.  Patient will identify signs/triggers that a boundary is not being respected. 3.  Patient will identify two ways to set boundaries in order to achieve balance in  their lives: 4.  Patient will demonstrate ability to communicate their needs and set boundaries  through discussion and/or role plays  Summary of Patient Progress:  Patient was sole participant in group today. CSW attempted discussed goals with patients. Patient's speech has improved but remains largely inaudible.   Therapeutic Modalities:   Cognitive Behavioral Therapy Solution-Focused Therapy  Almedia Balls 04/19/2021  2:44 PM

## 2021-04-19 NOTE — BHH Counselor (Signed)
CSW attempted to reach out to [place], [employer], [phone] and Coy Saunas, Program Manager [phone].  CSW had to leave HIPAA compliant voicemail with both.  Penni Homans, MSW, LCSW 04/19/2021 1:01 PM

## 2021-04-19 NOTE — Progress Notes (Signed)
[facility] MD Progress Note  04/19/2021 10:16 AM [name]  MRN:  [id number]  CC "I'm okay"  Subjective:   65 year old male with schizophrenia brought to emergency room by group home for increasing agitation and medication non-compliance. No acute events overnight, medication compliant, ADLs intact.  Patient seen one-on-one today. He states he is okay. Denies SI/HI/AH/VH. Has been active on the unit and attending groups. Patient is at his baseline. Team continuing to attempt contact with [facility] DSS guardian and group home for discharge planning.   Principal Problem: Schizophrenia (HCC) Diagnosis: Principal Problem:   Schizophrenia (HCC) Active Problems:   Diabetes (HCC)   Hypothyroid   Hyperlipidemia   HTN (hypertension)  Total Time spent with patient: 20 minutes  Past Psychiatric History: See H&P  Past Medical History:  Past Medical History:  Diagnosis Date   Diabetes mellitus without complication (HCC)    HTN (hypertension)    Hyperammonemia (HCC) While on Depakote   Hyperlipemia    Lithium toxicity    Neutropenia (HCC)    Schizophrenia (HCC)    Seizure (HCC) While toxic on Depakote   Sickle cell trait (HCC)    Thyroid disease    TIA (transient ischemic attack) in 2009   History reviewed. No pertinent surgical history. Family History: History reviewed. No pertinent family history. Family Psychiatric  History: See H&P Social History:  Social History   Substance and Sexual Activity  Alcohol Use No     Social History   Substance and Sexual Activity  Drug Use Not Currently    Social History   Socioeconomic History   Marital status: Single    Spouse name: Not on file   Number of children: Not on file   Years of education: Not on file   Highest education level: Not on file  Occupational History   Not on file  Tobacco Use   Smoking status: Never   Smokeless tobacco: Never  Vaping Use   Vaping Use: Never used  Substance and Sexual Activity   Alcohol  use: No   Drug use: Not Currently   Sexual activity: Not Currently  Other Topics Concern   Not on file  Social History Narrative   Not on file   Social Determinants of Health   Financial Resource Strain: Not on file  Food Insecurity: Not on file  Transportation Needs: Not on file  Physical Activity: Not on file  Stress: Not on file  Social Connections: Not on file   Additional Social History:                         Sleep: Fair  Appetite:  Fair  Current Medications: Current Facility-Administered Medications  Medication Dose Route Frequency Provider Last Rate Last Admin   acetaminophen (TYLENOL) tablet 650 mg  650 mg Oral Q6H PRN Clapacs, John T, MD       alum & mag hydroxide-simeth (MAALOX/MYLANTA) 200-200-20 MG/5ML suspension 30 mL  30 mL Oral Q4H PRN Clapacs, Jackquline Denmark, MD       aspirin EC tablet 81 mg  81 mg Oral Daily Clapacs, John T, MD   81 mg at 04/19/21 0730   benztropine (COGENTIN) tablet 1 mg  1 mg Oral BID WC Clapacs, John T, MD   1 mg at 04/19/21 0730   clonazePAM (KLONOPIN) tablet 0.5 mg  0.5 mg Oral BID WC Clapacs, John T, MD   0.5 mg at 04/19/21 0730   folic acid (FOLVITE) tablet 1  mg  1 mg Oral Daily Clapacs, John T, MD   1 mg at 04/19/21 0731   haloperidol (HALDOL) tablet 10 mg  10 mg Oral BID Jesse Sans, MD   10 mg at 04/19/21 8841   Or   haloperidol lactate (HALDOL) injection 10 mg  10 mg Intramuscular BID Jesse Sans, MD       haloperidol decanoate (HALDOL DECANOATE) 100 MG/ML injection 200 mg  200 mg Intramuscular Q30 days Jesse Sans, MD       hydrOXYzine (ATARAX/VISTARIL) tablet 50 mg  50 mg Oral TID PRN Clapacs, Jackquline Denmark, MD   50 mg at 04/15/21 0809   levothyroxine (SYNTHROID) tablet 125 mcg  125 mcg Oral Daily Jesse Sans, MD   125 mcg at 04/19/21 6606   magnesium hydroxide (MILK OF MAGNESIA) suspension 30 mL  30 mL Oral Daily PRN Clapacs, Jackquline Denmark, MD       metFORMIN (GLUCOPHAGE) tablet 1,000 mg  1,000 mg Oral BID WC Clapacs,  John T, MD   1,000 mg at 04/19/21 0730   OLANZapine zydis (ZYPREXA) disintegrating tablet 20 mg  20 mg Oral BID WC Jesse Sans, MD   20 mg at 04/19/21 3016   Or   OLANZapine (ZYPREXA) injection 20 mg  20 mg Intramuscular BID WC Jesse Sans, MD       simvastatin (ZOCOR) tablet 20 mg  20 mg Oral QHS Clapacs, Jackquline Denmark, MD   20 mg at 04/18/21 2144   traZODone (DESYREL) tablet 150 mg  150 mg Oral QHS Jesse Sans, MD   150 mg at 04/18/21 2144   vitamin B-12 (CYANOCOBALAMIN) tablet 1,000 mcg  1,000 mcg Oral Daily Clapacs, Jackquline Denmark, MD   1,000 mcg at 04/19/21 0109    Lab Results: No results found for this or any previous visit (from the past 48 hour(s)).  Blood Alcohol level:  Lab Results  Component Value Date   ETH <10 04/08/2021   ETH <10 09/08/2019    Metabolic Disorder Labs: Lab Results  Component Value Date   HGBA1C 6.0 (H) 04/08/2021   MPG 126 04/08/2021   MPG 122.63 08/19/2019   No results found for: PROLACTIN Lab Results  Component Value Date   CHOL 156 04/08/2021   TRIG 66 04/08/2021   HDL 41 04/08/2021   CHOLHDL 3.8 04/08/2021   VLDL 13 04/08/2021   LDLCALC 102 (H) 04/08/2021    Physical Findings: AIMS:  , ,  ,  ,    CIWA:    COWS:     Musculoskeletal: Strength & Muscle Tone: within normal limits Gait & Station: normal Patient leans: N/A  Psychiatric Specialty Exam:  Presentation  General Appearance: Casual Eye Contact:Fair Speech:Gabled, normal rate Speech Volume:Normal Handedness:Right   Mood and Affect  Mood:Euthymic Affect:Flat   Thought Process  Thought Processes:More linear Descriptions of Associations:More intact Orientation:AAOx4 Thought Content: Religious preoccupation History of Schizophrenia/Schizoaffective disorder:Yes  Duration of Psychotic Symptoms:Less than six months  Hallucinations:Denies  Ideas of Reference: Intact Suicidal Thoughts:Denies  Homicidal Thoughts:Denies   Sensorium  Memory:Immediate Poor; Recent  Poor; Remote Poor  Judgment:Intact Insight:Present  Executive Functions  Concentration:Fair Attention Span:Fair Recall:Fair Fund of Knowledge:Fair Language:Fair  Psychomotor Activity  Psychomotor Activity:TD present   Assets  Assets:Desire for Improvement; Financial Resources/Insurance; Housing; Resilience   Sleep  Sleep:Fair, 6.5 hours    Physical Exam: Physical Exam ROS Blood pressure (!) 148/84, pulse 90, temperature 98 F (36.7 C), temperature source Oral, resp. rate 18,  height 6\' 3"  (1.905 m), weight 101 kg, SpO2 99 %. Body mass index is 27.83 kg/m.   Treatment Plan Summary: Daily contact with patient to assess and evaluate symptoms and progress in treatment and Medication management 65 year old male presenting with psychosis and increased aggression in the context of medication noncompliance. Patient appears to be nearing his baseline. However, his baseline is such he is unable to adequately care for himself on his own. He has previously been declared incompetent by the courts, and has a legal guardian. He requires 24 hour supervision. Team continuing to reach out to guardian and group home for discharge planning.    Schizophrenia - Continue Haldol 10 mg BID, Zyprexa 20 mg BID , cogentin 1 mg BID - Patient continues to refuse Haldol LAI - He has previously failed monotherapy with Haldol, Zyprexa, Risperdal, and Prolixin   History of seizures/Anxiety - Continue klonopin 0.5 mg BID   Hypothyroidism - Synthroid 125 mcg daily    HTN - Lisinopril 2.5 mg daily    Diabetes Mellitus, Type 2 - Metformin 1000 mg BID    HLD- well controlled - Simvastatin 20 mg QHS, lipid panel 04/08/21 WNL aside from LDL minutely increased at 06/08/21.    Vitamin B12 deficiency - Cyanocobalamin 1000 mcg daily   04/19/21: Psychiatric exam above reviewed and remains accurate. Assessment and plan above reviewed and updated.      04/21/21, MD 04/19/2021, 10:16 AM

## 2021-04-19 NOTE — Progress Notes (Signed)
Patient calm during assessment but wouldn't answer questions directly. Patient presents with a better affect than when this writer had him previously this admission. Patient observed interacting appropriately with staff and peers on the unit. Patient compliant with medication administration per MD orders. Patient given support and encouragement to be active in his treatment plan. Pt being monitored Q 15 minutes for safety per unit protocol. Pt remains safe on the unit.

## 2021-04-19 NOTE — Progress Notes (Signed)
Recreation Therapy Notes  Date: 04/19/2021  Time: 10:30 am   Location: Craft room    Behavioral response: N/A   Intervention Topic: Decision Making   Discussion/Intervention: Patient did not attend group.   Clinical Observations/Feedback:  Patient did not attend group.   Anaiyah Anglemyer LRT/CTRS          Sean Macwilliams 04/19/2021 11:25 AM

## 2021-04-19 NOTE — Progress Notes (Signed)
Patient appears to be improved from previous encounters. He denies SI, HI, and AVH and does not voice any concerns. Speech is clearer and thoughts are less disorganized. Patient asked for medications to be brought to his room. When they were brought, patient asked what medications he was taking and how much of it he was taking. Patient was educated on medications and with some encouragement, took them. Patient remains safe on the unit at this time.

## 2021-04-20 NOTE — Care Management Important Message (Signed)
Important Message  Patient Details  Name: [name] MRN: [id number] Date of Birth: 1956/05/07   Medicare Important Message Given:  Yes  CSW reviewed Medicare Rights with [name], [name], [phone].   [name], LCSW 04/20/2021, 9:13 AM
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
## 2021-04-20 NOTE — Plan of Care (Signed)
  Problem: Education: Goal: Knowledge of [place] [facility] Education information/materials will improve Outcome: Adequate for Discharge Goal: Emotional status will improve Outcome: Adequate for Discharge Goal: Mental status will improve Outcome: Adequate for Discharge Goal: Verbalization of understanding the information provided will improve Outcome: Adequate for Discharge   Problem: Safety: Goal: Periods of time without injury will increase Outcome: Adequate for Discharge   Problem: Education: Goal: Will be free of psychotic symptoms Outcome: Adequate for Discharge Goal: Knowledge of the prescribed therapeutic regimen will improve Outcome: Adequate for Discharge   Problem: Education: Goal: Will be free of psychotic symptoms Outcome: Adequate for Discharge Goal: Knowledge of the prescribed therapeutic regimen will improve Outcome: Adequate for Discharge   Problem: Self-Concept: Goal: Will verbalize positive feelings about self Outcome: Adequate for Discharge

## 2021-04-20 NOTE — Progress Notes (Signed)
Patient ID: Feliciano Wynter, male   DOB: 1955/08/19, 65 y.o.   MRN: [id number]  Discharge Progress Note  Patient discharging back to group home today. AVS, BH summary and Suicide Risk assessment, all appointments and medication reviewed with patient.  Patient made eye contact with nurse during discharge process but refused to speak. Maryelizabeth Kaufmann, RN at bedside as witness. All belongings returned to patient at discharge.
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
## 2021-04-20 NOTE — BHH Suicide Risk Assessment (Addendum)
[facility] Discharge Suicide Risk Assessment   Principal Problem: Schizophrenia [facility]) Discharge Diagnoses: Principal Problem:   Schizophrenia (HCC) Active Problems:   Diabetes (HCC)   Hypothyroid   Hyperlipidemia   HTN (hypertension)   Total Time spent with patient: 35 minutes- 25 minutes face-to-face contact with patient, 10 minutes documentation, coordination of care, scripts  Musculoskeletal: Strength & Muscle Tone: within normal limits Gait & Station: normal Patient leans: N/A   Psychiatric Specialty Exam:  Presentation  General Appearance: Casual Eye Contact:Fair  Speech:Normal Speech Volume:Decreased  Handedness:Right   Mood and Affect  Mood:Irritable  Affect:Flat   Thought Process  Thought Processes: Scattered at times Descriptions of Associations:Intact Orientation:AAOx4 Thought Content:Logical History of Schizophrenia/Schizoaffective disorder:Yes  Duration of Psychotic Symptoms:Less than six months  Hallucinations:Denies Ideas of Reference:None Suicidal Thoughts:Denies Homicidal Thoughts:Denies  Sensorium  Memory:Immediate Poor; Recent Poor; Remote Poor  Judgment:Intact Insight:Shallow  Executive Functions  Concentration:Fair Attention Span:Poor  Recall:Poor  Fund of Knowledge:Poor  Language:Fair  Psychomotor Activity  Psychomotor Activity: TD present Assets  Assets:Desire for Improvement; Financial Resources/Insurance; Housing; Resilience   Sleep  Sleep: Fair  Physical Exam: Physical Exam ROS Blood pressure (!) 148/84, pulse 90, temperature 98 F (36.7 C), temperature source Oral, resp. rate 18, height 6\' 3"  (1.905 m), weight 101 kg, SpO2 99 %. Body mass index is 27.83 kg/m.  Mental Status Per Nursing Assessment::   On Admission:  NA  Demographic Factors:  Male and Age 19 or older  Loss Factors: NA  Historical Factors: Impulsivity  Risk Reduction Factors:   Religious beliefs about death, Living with another person,  especially a relative, Positive social support, Positive therapeutic relationship, and Positive coping skills or problem solving skills  Continued Clinical Symptoms:  Schizophrenia:   Paranoid or undifferentiated type Previous Psychiatric Diagnoses and Treatments Medical Diagnoses and Treatments/Surgeries  Cognitive Features That Contribute To Risk:  None    Suicide Risk:  Minimal: No identifiable suicidal ideation.  Patients presenting with no risk factors but with morbid ruminations; may be classified as minimal risk based on the severity of the depressive symptoms    Plan Of Care/Follow-up recommendations:  Activity:  as tolerated Diet:  low sodium heart healthy diet  76, MD 04/20/2021, 8:50 AM

## 2021-04-20 NOTE — Discharge Summary (Signed)
Physician Discharge Summary Note  Patient:  [name] is an 65 y.o., male MRN:  [id number] DOB:  13-Dec-1955 Patient phone:  [phone] (home)  Patient address:   [address] [place] [address],  Total Time spent with patient: 35 minutes- 25 minutes face-to-face contact with patient, 10 minutes documentation, coordination of care, scripts   Date of Admission:  04/09/2021 Date of Discharge: 04/20/2021  Reason for Admission:  65 year old male with schizophrenia brought to emergency room by group home for increasing agitation and medication non-compliance.  Principal Problem: Schizophrenia [facility]) Discharge Diagnoses: Principal Problem:   Schizophrenia (HCC) Active Problems:   Diabetes (HCC)   Hypothyroid   Hyperlipidemia   HTN (hypertension)   Past Psychiatric History: Multiple prior ED visits and inpatient admission. History of noncompliance which leads to psychosis and feeling that he needs to fast for religious reasons. No history of suicide attempts.   Past Medical History:  Past Medical History:  Diagnosis Date   Diabetes mellitus without complication (HCC)    HTN (hypertension)    Hyperammonemia (HCC) While on Depakote   Hyperlipemia    Lithium toxicity    Neutropenia (HCC)    Schizophrenia (HCC)    Seizure (HCC) While toxic on Depakote   Sickle cell trait (HCC)    Thyroid disease    TIA (transient ischemic attack) in 2009   History reviewed. No pertinent surgical history. Family History: History reviewed. No pertinent family history. Family Psychiatric  History: Unknown Social History:  Social History   Substance and Sexual Activity  Alcohol Use No     Social History   Substance and Sexual Activity  Drug Use Not Currently    Social History   Socioeconomic History   Marital status: Single    Spouse name: Not on file   Number of children: Not on file   Years of education: Not on file   Highest education level: Not on file  Occupational  History   Not on file  Tobacco Use   Smoking status: Never   Smokeless tobacco: Never  Vaping Use   Vaping Use: Never used  Substance and Sexual Activity   Alcohol use: No   Drug use: Not Currently   Sexual activity: Not Currently  Other Topics Concern   Not on file  Social History Narrative   Not on file   Social Determinants of Health   Financial Resource Strain: Not on file  Food Insecurity: Not on file  Transportation Needs: Not on file  Physical Activity: Not on file  Stress: Not on file  Social Connections: Not on file    Hospital Course:   65 year old male with schizophrenia brought to emergency room by group home for increasing agitation and medication non-compliance. Initially patient was refusing medications in the hospital, but later became compliant. With current regimen mood improved, and he was more interactive on the unit. He attended groups and interacted well with peers. At baseline patient continues to have a lot of religious content, and will answer questions somewhat off topic at times.   Physical Findings: AIMS:  , ,  ,  ,    CIWA:    COWS:     Musculoskeletal: Strength & Muscle Tone: within normal limits Gait & Station: normal Patient leans: N/A   Psychiatric Specialty Exam:  Presentation  General Appearance: Casual Eye Contact:Fair  Speech:Normal Speech Volume:Decreased  Handedness:Right   Mood and Affect  Mood:Irritable  Affect:Flat   Thought Process  Thought Processes: Scattered at times Descriptions  of Associations:Intact Orientation:AAOx4 Thought Content:Logical History of Schizophrenia/Schizoaffective disorder:Yes  Duration of Psychotic Symptoms:Less than six months  Hallucinations:Denies Ideas of Reference:None Suicidal Thoughts:Denies Homicidal Thoughts:Denies  Sensorium  Memory:Immediate Poor; Recent Poor; Remote Poor  Judgment:Intact Insight:Shallow  Executive Functions  Concentration:Fair Attention  Span:Poor  Recall:Poor  Fund of Knowledge:Poor  Language:Fair  Psychomotor Activity  Psychomotor Activity: TD present Assets  Assets:Desire for Improvement; Financial Resources/Insurance; Housing; Resilience   Sleep  Sleep: Fair  Physical Exam: Physical Exam Vitals and nursing note reviewed.  Constitutional:      Appearance: Normal appearance.  HENT:     Head: Normocephalic and atraumatic.     Right Ear: External ear normal.     Left Ear: External ear normal.     Nose: Nose normal.     Mouth/Throat:     Mouth: Mucous membranes are moist.     Pharynx: Oropharynx is clear.  Eyes:     Extraocular Movements: Extraocular movements intact.     Conjunctiva/sclera: Conjunctivae normal.     Pupils: Pupils are equal, round, and reactive to light.  Cardiovascular:     Rate and Rhythm: Normal rate.     Pulses: Normal pulses.  Pulmonary:     Effort: Pulmonary effort is normal.     Breath sounds: Normal breath sounds.  Abdominal:     General: Abdomen is flat.     Palpations: Abdomen is soft.  Musculoskeletal:        General: No swelling. Normal range of motion.     Cervical back: Normal range of motion and neck supple.  Skin:    General: Skin is warm and dry.  Neurological:     General: No focal deficit present.     Mental Status: He is alert and oriented to person, place, and time.  Psychiatric:        Mood and Affect: Mood normal.        Behavior: Behavior normal.        Thought Content: Thought content normal.        Judgment: Judgment normal.   Review of Systems  Constitutional: Negative.   HENT: Negative.    Eyes: Negative.   Respiratory: Negative.    Cardiovascular: Negative.   Gastrointestinal: Negative.   Genitourinary: Negative.   Musculoskeletal: Negative.   Skin: Negative.   Neurological: Negative.   Endo/Heme/Allergies:  Positive for environmental allergies. Does not bruise/bleed easily.  Psychiatric/Behavioral:  Negative for depression,  hallucinations, substance abuse and suicidal ideas. The patient does not have insomnia.   Blood pressure (!) 148/84, pulse 90, temperature 98 F (36.7 C), temperature source Oral, resp. rate 18, height 6\' 3"  (1.905 m), weight 101 kg, SpO2 99 %. Body mass index is 27.83 kg/m.   Social History   Tobacco Use  Smoking Status Never  Smokeless Tobacco Never   Tobacco Cessation:  N/A, patient does not currently use tobacco products   Blood Alcohol level:  Lab Results  Component Value Date   ETH <10 04/08/2021   ETH <10 09/08/2019    Metabolic Disorder Labs:  Lab Results  Component Value Date   HGBA1C 6.0 (H) 04/08/2021   MPG 126 04/08/2021   MPG 122.63 08/19/2019   No results found for: PROLACTIN Lab Results  Component Value Date   CHOL 156 04/08/2021   TRIG 66 04/08/2021   HDL 41 04/08/2021   CHOLHDL 3.8 04/08/2021   VLDL 13 04/08/2021   LDLCALC 102 (H) 04/08/2021    See Psychiatric Specialty Exam and Suicide  Risk Assessment completed by Attending Physician prior to discharge.  Discharge destination:  Home  Is patient on multiple antipsychotic therapies at discharge:  Yes,   Do you recommend tapering to monotherapy for antipsychotics?  No   Has Patient had three or more failed trials of antipsychotic monotherapy by history:  Yes,   Antipsychotic medications that previously failed include:   1.  Zyprexa., 2.  Haldol., and 3.  Risperdal. 4. Prolixin  Recommended Plan for Multiple Antipsychotic Therapies: NA  Discharge Instructions     Diet - low sodium heart healthy   Complete by: As directed    Increase activity slowly   Complete by: As directed       Allergies as of 04/20/2021       Reactions   Depakote [valproic Acid] Other (See Comments)   Reaction:  Hyperammonemia    Lithium Other (See Comments)   Pt has a prior history of Lithium toxicity.           Medication List     STOP taking these medications    divalproex 125 MG capsule Commonly known  as: DEPAKOTE SPRINKLE       TAKE these medications      Indication  aspirin EC 81 MG tablet Take 81 mg by mouth daily.  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  Indication: Extrapyramidal Reaction caused by Medications   clonazePAM 0.5 MG tablet Commonly known as: KLONOPIN Take 1 tablet (0.5 mg total) by mouth 2 (two) times daily with a meal.  Indication: anxiety   folic acid 1 MG tablet Commonly known as: FOLVITE Take 1 mg by mouth daily.  Indication: Anemia From Inadequate Folic Acid   haloperidol 10 MG tablet Commonly known as: HALDOL Take 1.5 tablets (15 mg total) by mouth 2 (two) times daily with a meal.  Indication: Psychosis   levothyroxine 125 MCG tablet Commonly known as: SYNTHROID Take 1 tablet (125 mcg total) by mouth daily before breakfast.  Indication: Underactive Thyroid   metFORMIN 1000 MG tablet Commonly known as: GLUCOPHAGE Take 1 tablet (1,000 mg total) by mouth 2 (two) times daily with a meal.  Indication: Type 2 Diabetes   OLANZapine zydis 20 MG disintegrating tablet Commonly known as: ZYPREXA Take 1 tablet (20 mg total) by mouth 2 (two) times daily with a meal.  Indication: Schizophrenia   simvastatin 20 MG tablet Commonly known as: ZOCOR Take 20 mg by mouth at bedtime.  Indication: High Amount of Fats in the Blood   traZODone 150 MG tablet Commonly known as: DESYREL Take 150 mg by mouth at bedtime.  Indication: Trouble Sleeping   vitamin B-12 1000 MCG tablet Commonly known as: CYANOCOBALAMIN Take 1,000 mcg by mouth daily.  Indication: Inadequate Vitamin B12         Follow-up recommendations:  Activity:  as tolerated Diet:  low sodium heart healthy diet  Comments:  Continue all medications as above except for Depakote. If suicidal or homicidal ideations occur, call 911 or  proceed to nearest emergency room.   Signed: Jesse Sans, MD 04/20/2021, 8:51 AM

## 2021-04-20 NOTE — Progress Notes (Signed)
Patient refused to talk to the nurse this RN and refused medications. Nurse attempted to perform assessment and asked patient about medications. Patient refused to look up at nurse or acknowledge presence. RN attempted to approach patient 2x with staff. Patient continued to ignore all parties present. MD aware. Patient preparing for discharge. Cont to monitor for safety.

## 2021-04-20 NOTE — Progress Notes (Signed)
Recreation Therapy Notes  INPATIENT RECREATION TR PLAN  Patient Details Name: [name] MRN: [id number] DOB: December 30, 1955 Today's Date: 04/20/2021  Rec Therapy Plan Is patient appropriate for Therapeutic Recreation?: Yes Treatment times per week: at least 3 Estimated Length of Stay: 5-7 days TR Treatment/Interventions: Group participation (Comment)  Discharge Criteria Pt will be discharged from therapy if:: Discharged Treatment plan/goals/alternatives discussed and agreed upon by:: Patient/family  Discharge Summary Short term goals set: Patient will engage in groups without prompting or encouragement from LRT x3 group sessions within 5 recreation therapy group sessions Short term goals met: Adequate for discharge Progress toward goals comments: Groups attended Which groups?: Goal setting, Social skills, Stress management Reason goals not met: N/A Therapeutic equipment acquired: N/A Reason patient discharged from therapy: Discharge from hospital Pt/family agrees with progress & goals achieved: Yes Date patient discharged from therapy: 04/20/21   Devaris Quirk 04/20/2021, 11:47 AM

## 2021-04-20 NOTE — Plan of Care (Signed)
  Problem: Group Participation Goal: STG - Patient will engage in groups without prompting or encouragement from LRT x3 group sessions within 5 recreation therapy group sessions Description: STG - Patient will engage in groups without prompting or encouragement from LRT x3 group sessions within 5 recreation therapy group sessions 04/20/2021 1145 by Alveria Apley, LRT Outcome: Adequate for Discharge 04/20/2021 1145 by Alveria Apley, LRT Outcome: Adequate for Discharge

## 2021-04-20 NOTE — Progress Notes (Signed)
Recreation Therapy Notes  Date: 04/20/2021  Time: 10:00 am   Location: Craft room    Behavioral response: N/A   Intervention Topic: Time Management   Discussion/Intervention: Patient did not attend group.   Clinical Observations/Feedback:  Patient did not attend group.   Tasia Liz LRT/CTRS        Becket Wecker 04/20/2021 11:36 AM

## 2021-08-10 ENCOUNTER — Emergency Department: Payer: Medicare Other

## 2021-08-10 ENCOUNTER — Other Ambulatory Visit: Payer: Self-pay

## 2021-08-10 ENCOUNTER — Emergency Department
Admission: EM | Admit: 2021-08-10 | Discharge: 2021-08-11 | Disposition: A | Discharge status: Home / Self Care | Payer: Medicare Other | Attending: Emergency Medicine | Admitting: Emergency Medicine

## 2021-08-10 DIAGNOSIS — F209 Schizophrenia, unspecified: Secondary | ICD-10-CM | POA: Diagnosis present

## 2021-08-10 DIAGNOSIS — R569 Unspecified convulsions: Secondary | ICD-10-CM

## 2021-08-10 DIAGNOSIS — R451 Restlessness and agitation: Secondary | ICD-10-CM | POA: Insufficient documentation

## 2021-08-10 DIAGNOSIS — Z79899 Other long term (current) drug therapy: Secondary | ICD-10-CM | POA: Diagnosis not present

## 2021-08-10 DIAGNOSIS — R4182 Altered mental status, unspecified: Secondary | ICD-10-CM | POA: Insufficient documentation

## 2021-08-10 DIAGNOSIS — F203 Undifferentiated schizophrenia: Secondary | ICD-10-CM | POA: Diagnosis not present

## 2021-08-10 DIAGNOSIS — Z7984 Long term (current) use of oral hypoglycemic drugs: Secondary | ICD-10-CM | POA: Insufficient documentation

## 2021-08-10 DIAGNOSIS — Z7982 Long term (current) use of aspirin: Secondary | ICD-10-CM | POA: Insufficient documentation

## 2021-08-10 DIAGNOSIS — E119 Type 2 diabetes mellitus without complications: Secondary | ICD-10-CM | POA: Diagnosis not present

## 2021-08-10 DIAGNOSIS — Z20822 Contact with and (suspected) exposure to covid-19: Secondary | ICD-10-CM | POA: Diagnosis not present

## 2021-08-10 DIAGNOSIS — Z8673 Personal history of transient ischemic attack (TIA), and cerebral infarction without residual deficits: Secondary | ICD-10-CM | POA: Diagnosis not present

## 2021-08-10 DIAGNOSIS — I10 Essential (primary) hypertension: Secondary | ICD-10-CM | POA: Insufficient documentation

## 2021-08-10 DIAGNOSIS — E039 Hypothyroidism, unspecified: Secondary | ICD-10-CM | POA: Insufficient documentation

## 2021-08-10 LAB — URINALYSIS, COMPLETE (UACMP) WITH MICROSCOPIC
Bacteria, UA: NONE SEEN
Bilirubin Urine: NEGATIVE
Glucose, UA: NEGATIVE mg/dL
Hgb urine dipstick: NEGATIVE
Leukocytes,Ua: NEGATIVE
Nitrite: NEGATIVE
Protein, ur: NEGATIVE mg/dL
Specific Gravity, Urine: 1.02 (ref 1.005–1.030)
Squamous Epithelial / HPF: NONE SEEN (ref 0–5)
pH: 8.5 — ABNORMAL HIGH (ref 5.0–8.0)

## 2021-08-10 LAB — URINE DRUG SCREEN, QUALITATIVE (ARMC ONLY)
Amphetamines, Ur Screen: NOT DETECTED
Barbiturates, Ur Screen: NOT DETECTED
Benzodiazepine, Ur Scrn: POSITIVE — AB
Cannabinoid 50 Ng, Ur ~~LOC~~: NOT DETECTED
Cocaine Metabolite,Ur ~~LOC~~: NOT DETECTED
MDMA (Ecstasy)Ur Screen: NOT DETECTED
Methadone Scn, Ur: NOT DETECTED
Opiate, Ur Screen: NOT DETECTED
Phencyclidine (PCP) Ur S: NOT DETECTED
Tricyclic, Ur Screen: NOT DETECTED

## 2021-08-10 LAB — CBC WITH DIFFERENTIAL/PLATELET
Abs Immature Granulocytes: 0.01 10*3/uL (ref 0.00–0.07)
Basophils Absolute: 0 10*3/uL (ref 0.0–0.1)
Basophils Relative: 0 %
Eosinophils Absolute: 0.1 10*3/uL (ref 0.0–0.5)
Eosinophils Relative: 2 %
HCT: 41.6 % (ref 39.0–52.0)
Hemoglobin: 13.6 g/dL (ref 13.0–17.0)
Immature Granulocytes: 0 %
Lymphocytes Relative: 22 %
Lymphs Abs: 1 10*3/uL (ref 0.7–4.0)
MCH: 27.4 pg (ref 26.0–34.0)
MCHC: 32.7 g/dL (ref 30.0–36.0)
MCV: 83.9 fL (ref 80.0–100.0)
Monocytes Absolute: 0.9 10*3/uL (ref 0.1–1.0)
Monocytes Relative: 19 %
Neutro Abs: 2.7 10*3/uL (ref 1.7–7.7)
Neutrophils Relative %: 57 %
Platelets: 221 10*3/uL (ref 150–400)
RBC: 4.96 MIL/uL (ref 4.22–5.81)
RDW: 14.7 % (ref 11.5–15.5)
WBC: 4.7 10*3/uL (ref 4.0–10.5)
nRBC: 0 % (ref 0.0–0.2)

## 2021-08-10 LAB — VALPROIC ACID LEVEL: Valproic Acid Lvl: 13 ug/mL — ABNORMAL LOW (ref 50.0–100.0)

## 2021-08-10 LAB — COMPREHENSIVE METABOLIC PANEL
ALT: 10 U/L (ref 0–44)
AST: 24 U/L (ref 15–41)
Albumin: 3.9 g/dL (ref 3.5–5.0)
Alkaline Phosphatase: 55 U/L (ref 38–126)
Anion gap: 10 (ref 5–15)
BUN: 22 mg/dL (ref 8–23)
CO2: 26 mmol/L (ref 22–32)
Calcium: 8.9 mg/dL (ref 8.9–10.3)
Chloride: 106 mmol/L (ref 98–111)
Creatinine, Ser: 1.13 mg/dL (ref 0.61–1.24)
GFR, Estimated: >60 mL/min (ref >60)
Glucose, Bld: 125 mg/dL — ABNORMAL HIGH (ref 70–99)
Potassium: 3.7 mmol/L (ref 3.5–5.1)
Sodium: 142 mmol/L (ref 135–145)
Total Bilirubin: 0.6 mg/dL (ref 0.3–1.2)
Total Protein: 7.6 g/dL (ref 6.5–8.1)

## 2021-08-10 LAB — RESP PANEL BY RT-PCR (FLU A&B, COVID) ARPGX2
Influenza A by PCR: NEGATIVE
Influenza B by PCR: NEGATIVE
SARS Coronavirus 2 by RT PCR: NEGATIVE

## 2021-08-10 LAB — AMMONIA: Ammonia: 31 umol/L (ref 9–35)

## 2021-08-10 MED ORDER — VITAMIN B-12 1000 MCG PO TABS
1000.0000 ug | ORAL_TABLET | Freq: Every day | ORAL | Status: DC
  Administered 2021-08-10 – 2021-08-11 (×2): 1000 ug via ORAL
  Filled 2021-08-10 (×2): qty 1

## 2021-08-10 MED ORDER — HALOPERIDOL LACTATE 5 MG/ML IJ SOLN
5.0000 mg | Freq: Once | INTRAMUSCULAR | Status: AC
  Administered 2021-08-10: 5 mg via INTRAVENOUS
  Filled 2021-08-10: qty 1

## 2021-08-10 MED ORDER — HALOPERIDOL LACTATE 5 MG/ML IJ SOLN
2.0000 mg | Freq: Once | INTRAMUSCULAR | Status: AC
  Administered 2021-08-10: 2 mg via INTRAMUSCULAR
  Filled 2021-08-10: qty 1

## 2021-08-10 MED ORDER — SIMVASTATIN 10 MG PO TABS
20.0000 mg | ORAL_TABLET | Freq: Every day | ORAL | Status: DC
  Administered 2021-08-10: 20 mg via ORAL
  Filled 2021-08-10: qty 2

## 2021-08-10 MED ORDER — TRAZODONE HCL 50 MG PO TABS
150.0000 mg | ORAL_TABLET | Freq: Every day | ORAL | Status: DC
  Administered 2021-08-10: 150 mg via ORAL
  Filled 2021-08-10: qty 1

## 2021-08-10 MED ORDER — HALOPERIDOL 5 MG PO TABS
20.0000 mg | ORAL_TABLET | Freq: Two times a day (BID) | ORAL | Status: DC
  Administered 2021-08-11: 20 mg via ORAL
  Filled 2021-08-10: qty 4

## 2021-08-10 MED ORDER — OLANZAPINE 5 MG PO TBDP
20.0000 mg | ORAL_TABLET | Freq: Every morning | ORAL | Status: DC
  Administered 2021-08-11: 20 mg via ORAL
  Filled 2021-08-10: qty 4

## 2021-08-10 MED ORDER — ASPIRIN EC 81 MG PO TBEC
81.0000 mg | DELAYED_RELEASE_TABLET | Freq: Every day | ORAL | Status: DC
  Administered 2021-08-10 – 2021-08-11 (×2): 81 mg via ORAL
  Filled 2021-08-10 (×2): qty 1

## 2021-08-10 MED ORDER — HYDROXYZINE HCL 25 MG PO TABS: 50.0000 mg | ORAL_TABLET | Freq: Two times a day (BID) | ORAL | Status: DC | PRN

## 2021-08-10 MED ORDER — BENZTROPINE MESYLATE 1 MG PO TABS
1.0000 mg | ORAL_TABLET | Freq: Two times a day (BID) | ORAL | Status: DC
  Administered 2021-08-11: 1 mg via ORAL
  Filled 2021-08-10: qty 1

## 2021-08-10 MED ORDER — LEVOTHYROXINE SODIUM 50 MCG PO TABS
125.0000 ug | ORAL_TABLET | Freq: Every day | ORAL | Status: DC
  Administered 2021-08-11: 125 ug via ORAL
  Filled 2021-08-10: qty 3

## 2021-08-10 MED ORDER — CLONAZEPAM 0.5 MG PO TABS
0.5000 mg | ORAL_TABLET | Freq: Two times a day (BID) | ORAL | Status: DC
  Administered 2021-08-11: 0.5 mg via ORAL
  Filled 2021-08-10: qty 1

## 2021-08-10 MED ORDER — FOLIC ACID 1 MG PO TABS
1.0000 mg | ORAL_TABLET | Freq: Every day | ORAL | Status: DC
  Administered 2021-08-10 – 2021-08-11 (×2): 1 mg via ORAL
  Filled 2021-08-10 (×2): qty 1

## 2021-08-10 MED ORDER — METFORMIN HCL 500 MG PO TABS
1000.0000 mg | ORAL_TABLET | Freq: Two times a day (BID) | ORAL | Status: DC
  Administered 2021-08-11: 1000 mg via ORAL
  Filled 2021-08-10: qty 2

## 2021-08-10 MED ORDER — DIVALPROEX SODIUM 125 MG PO CSDR
1000.0000 mg | DELAYED_RELEASE_CAPSULE | Freq: Two times a day (BID) | ORAL | Status: DC
  Administered 2021-08-10 – 2021-08-11 (×2): 1000 mg via ORAL
  Filled 2021-08-10 (×3): qty 8

## 2021-08-10 MED ORDER — LORAZEPAM 2 MG/ML IJ SOLN
2.0000 mg | Freq: Once | INTRAMUSCULAR | Status: AC
  Administered 2021-08-10: 2 mg via INTRAVENOUS
  Filled 2021-08-10: qty 1

## 2021-08-10 MED ORDER — MIDAZOLAM HCL 2 MG/2ML IJ SOLN
2.0000 mg | Freq: Once | INTRAMUSCULAR | Status: AC
  Administered 2021-08-10: 2 mg via INTRAMUSCULAR
  Filled 2021-08-10: qty 2

## 2021-08-10 NOTE — ED Notes (Signed)
Patient given a snack 

## 2021-08-10 NOTE — BH Assessment (Signed)
Comprehensive Clinical Assessment (CCA) Note  08/10/2021 [name] [id number]  Chief Complaint: Patient is a 66 year old male presenting to [facility] ED via EMS. Per triage note Pt BIB EMS from a group home. Reported that pt started having a localized seizure in his leg and then "pt started hitting himself in the face." Since the episode pt is not responding to questions appropriately. Pt alert and talking about church and religious topics, but at pt's baseline pt is normally able to answer questions appropriately. During assessment patient appears alert and oriented x1, patient is not quite oriented to where he is or the situation. When asked if patient knows where he is he reports being in the [place] but unable to report that he is in the hospital, he is able to report the current president.  When asked if he knows where he lives he reports an address, unsure if that address is the address to his current group home. When asked most questions patient is slow to respond and his thoughts are disorganized and mainly focused on religion. When asked if he is experiencing AH he again answers as "I hear the [place]." Before the psyc team arrived patient had reported to his nurse that he hears "a mix" of voices and asked if this was a punishment from [place].   Collateral information obtained from patient's group home administrator [name] [phone] who reports "he is always non compliant with his medications, we have to crush it and put it in his food." "We took him to the doctor because the doctor thought he had a UTI and prescribed him antibiotics and he finished all of those but it wasn't a UTI, the other day he wasn't acting like himself and then today he was hitting himself in the head and I told the staff to call 911."  Per Psyc NP Paris Lore Dixon patient is recommended for Inpatient Chief Complaint  Patient presents with   Seizures   Visit Diagnosis: Schizophrenia by hx    CCA Screening,  Triage and Referral (STR)  Patient Reported Information How did you hear about [place]? Other (Comment)  Referral name: No data recorded Referral phone number: No data recorded  Whom do you see for routine medical problems? No data recorded Practice/Facility Name: No data recorded Practice/Facility Phone Number: No data recorded Name of Contact: No data recorded Contact Number: No data recorded Contact Fax Number: No data recorded Prescriber Name: No data recorded Prescriber Address (if known): No data recorded  What Is the Reason for Your Visit/Call Today? Patient presents via EMS from group home due to bizarre behavior  How Long Has This Been Causing You Problems? > than 6 months  What Do You Feel Would Help You the Most Today? -- (Assessment only)   Have You Recently Been in Any Inpatient Treatment (Hospital/Detox/Crisis Center/28-Day Program)? No data recorded Name/Location of Program/Hospital:No data recorded How Long Were You There? No data recorded When Were You Discharged? No data recorded  Have You Ever Received Services From [facility] Before? No data recorded Who Do You See at [facility]? No data recorded  Have You Recently Had Any Thoughts About Hurting Yourself? -- (UTA)  Are You Planning to Commit Suicide/Harm Yourself At This time? -- (UTA)   Have you Recently Had Thoughts About [place]? -- (UTA)  Explanation: No data recorded  Have You Used Any Alcohol or Drugs in the Past 24 Hours? -- (UTA)  How Long Ago Did You  Use Drugs or Alcohol? No data recorded What Did You Use and How Much? No data recorded  Do You Currently Have a Therapist/Psychiatrist? -- (Unknown)  Name of Therapist/Psychiatrist: No data recorded  Have You Been Recently Discharged From Any Office Practice or Programs? No  Explanation of Discharge From Practice/Program: No data recorded    CCA Screening Triage Referral Assessment Type of Contact: Face-to-Face  Is this  Initial or Reassessment? No data recorded Date Telepsych consult ordered in [facility]:  No data recorded Time Telepsych consult ordered in [facility]:  No data recorded  Patient Reported Information Reviewed? No data recorded Patient Left Without Being Seen? No data recorded Reason for Not Completing Assessment: No data recorded  Collateral Involvement: [place] Administrator   Does Patient Have a [place]? No data recorded Name and Contact of Legal Guardian: No data recorded If Minor and Not Living with Parent(s), Who has Custody? No data recorded Is CPS involved or ever been involved? Never  Is APS involved or ever been involved? Currently   Patient Determined To Be At Risk for Harm To Self or Others Based on Review of Patient Reported Information or Presenting Complaint? No  Method: No data recorded Availability of Means: No data recorded Intent: No data recorded Notification Required: No data recorded Additional Information for Danger to Others Potential: No data recorded Additional Comments for Danger to Others Potential: No data recorded Are There Guns or Other Weapons in Your Home? No data recorded Types of Guns/Weapons: No data recorded Are These Weapons Safely Secured?                            No data recorded Who Could Verify You Are Able To Have These Secured: No data recorded Do You Have any Outstanding Charges, Pending Court Dates, Parole/Probation? No data recorded Contacted To Inform of Risk of Harm To Self or Others: No data recorded  [facility] ED   Does Patient Present under Involuntary Commitment? No  IVC Papers Initial File Date: 04/08/21   [place] of Residence: [place]   Patient Currently Receiving the Following Services: Group Home; Medication Management   Determination of Need: Emergent (2 hours)   Options For Referral: Inpatient Hospitalization; ED Visit; Medication Management     CCA  Biopsychosocial Intake/Chief Complaint:  No data recorded Current Symptoms/Problems: No data recorded  Patient Reported Schizophrenia/Schizoaffective Diagnosis in Past: Yes   Strengths: Patient is able to communicate  Preferences: No data recorded Abilities: No data recorded  Type of Services Patient Feels are Needed: No data recorded  Initial Clinical Notes/Concerns: No data recorded  Mental Health Symptoms Depression:   None   Duration of Depressive symptoms: No data recorded  Mania:   Racing thoughts   Anxiety:    Restlessness   Psychosis:   Delusions; Hallucinations   Duration of Psychotic symptoms:  Greater than six months   Trauma:   None   Obsessions:   Poor insight   Compulsions:   Absent insight/delusional   Inattention:   None   Hyperactivity/Impulsivity:   None   Oppositional/Defiant Behaviors:   None   Emotional Irregularity:   None   Other Mood/Personality Symptoms:  No data recorded   Mental Status Exam Appearance and self-care  Stature:   Average   Weight:   Thin   Clothing:   Casual   Grooming:   Normal   Cosmetic use:   None  Posture/gait:   Normal   Motor activity:   Not Remarkable   Sensorium  Attention:   Confused   Concentration:   Scattered   Orientation:   Person   Recall/memory:   Defective in Short-term   Affect and Mood  Affect:   Blunted   Mood:   Other (Comment)   Relating  Eye contact:   Normal   Facial expression:   Responsive   Attitude toward examiner:   Cooperative   Thought and Language  Speech flow:  Slow; Slurred   Thought content:   Delusions   Preoccupation:   Ruminations; Religion   Hallucinations:   Auditory   Organization:  No data recorded  [employer] of Knowledge:   Fair   Intelligence:   Average   Abstraction:   Functional   Judgement:   Impaired   Reality Testing:   Distorted   Insight:   Lacking   Decision Making:    Confused   Social Functioning  Social Maturity:   Responsible   Social Judgement:   Heedless   Stress  Stressors:   Other (Comment)   Coping Ability:   Exhausted   Skill Deficits:   Decision making   Supports:   Friends/Service system     Religion: Religion/Spirituality Are You A Religious Person?: Yes What is Your Religious Affiliation?: Personal assistant: Leisure / Recreation Do You Have Hobbies?:  (UTA)  Exercise/Diet: Exercise/Diet Do You Exercise?:  (UTA) Have You Gained or Lost A Significant Amount of Weight in the Past Six Months?:  (UTA) Do You Follow a Special Diet?:  (UTA) Do You Have Any Trouble Sleeping?:  (UTA)   CCA Employment/Education Employment/Work Situation: Employment / Work Situation Employment Situation: On disability Why is Patient on Disability: Mental Health How Long has Patient Been on Disability: Unknown Has Patient ever Been in the [employer]?: No  Education: Education Is Patient Currently Attending School?: No Did You Have An Individualized Education Program (IIEP): No Did You Have Any Difficulty At School?: No Patient's Education Has Been Impacted by Current Illness: No   CCA Family/Childhood History Family and Relationship History: Family history Marital status: Single Does patient have children?: No  Childhood History:  Childhood History Did patient suffer any verbal/emotional/physical/sexual abuse as a child?:  (UTA) Did patient suffer from severe childhood neglect?:  (UTA) Has patient ever been sexually abused/assaulted/raped as an adolescent or adult?:  (UTA) Was the patient ever a victim of a crime or a disaster?:  (UTA) Witnessed domestic violence?:  (UTA) Has patient been affected by domestic violence as an adult?:  Special educational needs teacher)  Child/Adolescent Assessment:     CCA Substance Use Alcohol/Drug Use: Alcohol / Drug Use Pain Medications: See PTA Prescriptions: See PTA Over the Counter: See  PTA History of alcohol / drug use?: No history of alcohol / drug abuse Longest period of sobriety (when/how long): No none history of the use of mind altering substances Negative Consequences of Use:  (n/a) Withdrawal Symptoms:  (n/a)                         ASAM's:  Six Dimensions of Multidimensional Assessment  Dimension 1:  Acute Intoxication and/or Withdrawal Potential:      Dimension 2:  Biomedical Conditions and Complications:      Dimension 3:  Emotional, Behavioral, or Cognitive Conditions and Complications:     Dimension 4:  Readiness to Change:     Dimension 5:  Relapse,  Continued use, or Continued Problem Potential:     Dimension 6:  Recovery/Living Environment:     ASAM Severity Score:    ASAM Recommended Level of Treatment:     Substance use Disorder (SUD)    Recommendations for Services/Supports/Treatments:    DSM5 Diagnoses: Patient Active Problem List   Diagnosis Date Noted   Schizophrenia ([place]) 04/09/2021   AKI (acute kidney injury) ([place])    Rash    Agitation    Tachycardia    Recurrent seizures ([place]) 08/19/2019   Seizure ([place]) 08/18/2019   Tardive dyskinesia 11/21/2015   Noncompliance 11/21/2015   HTN (hypertension) 10/10/2015   Hyperlipidemia 09/25/2015   Schizophrenia, undifferentiated ([place])    Diabetes ([place]) 09/16/2015   Hypothyroid 09/16/2015    Patient Centered Plan: Patient is on the following Treatment Plan(s):  Impulse Control   Referrals to Alternative Service(s): Referred to Alternative Service(s):   Place:   Date:   Time:    Referred to Alternative Service(s):   Place:   Date:   Time:    Referred to Alternative Service(s):   Place:   Date:   Time:    Referred to Alternative Service(s):   Place:   Date:   Time:     Yoon Barca A Stepheni Cameron, LCAS-A

## 2021-08-10 NOTE — ED Notes (Signed)
Psych in with pt at this time

## 2021-08-10 NOTE — ED Triage Notes (Signed)
Pt BIB EMS from a group home. Reported that pt started having a localized seizure in his leg and then "pt started hitting himself in the face." Since the episode pt is not responding to questions appropriately. Pt alert and talking about church and religious topics, but at pt's baseline pt is normally able to answer questions appropriately.   EMS V/S  187/105 HR 96 SpO2 98% CBG 126.

## 2021-08-10 NOTE — ED Notes (Signed)
Pt refusing to comply with instructions to undress and be placed on monitor at this time.

## 2021-08-10 NOTE — BH Assessment (Signed)
Patient to be reviewed with [facility] potentially tomorrow 08/11/20 due to staffing issues

## 2021-08-10 NOTE — ED Notes (Signed)
When asked about AVH, pt pauses and states "a mix". Pt asked when provided with night meds if this was a punishment from God. Provided with snack and drink now, educated on rules of quad.

## 2021-08-10 NOTE — ED Provider Notes (Signed)
[facility] Provider Note    Event Date/Time   First MD Initiated Contact with Patient 08/10/21 [phone]     (approximate)   History   Seizures   HPI  [name] is a 66 y.o. male with past medical history of diabetes, condition, hyperlipidemia, gets of hernia, medication noncompliance, tardive dyskinesia who presents after a reported seizure and altered mental status.  Patient is coming from a group home.  Apparently he had focal shaking in the left leg and then started slapping himself.  Has not been at his baseline since.  Unable to provide history.  Reviewed most recent behavioral health admission with discharge on 04/20/21 during which patient was admitted for agitation and medication noncompliance.  Was treated with olanzapine, haloperidol, benztropine and clonazepam and eventually his mood stabilized.    Past Medical History:  Diagnosis Date   Diabetes mellitus without complication ([place])    HTN (hypertension)    Hyperammonemia ([place]) While on Depakote   Hyperlipemia    Lithium toxicity    Neutropenia (H[facility])    Schizophrenia ([place])    Seizure ([place]) While toxic on Depakote   Sickle cell trait ([place])    Thyroid disease    TIA (transient ischemic attack) in 2009    Patient Active Problem List   Diagnosis Date Noted   Schizophrenia ([place]) 04/09/2021   AKI (acute kidney injury) ([place])    Rash    Agitation    Tachycardia    Recurrent seizures ([place]) 08/19/2019   Seizure ([place]) 08/18/2019   Tardive dyskinesia 11/21/2015   Noncompliance 11/21/2015   HTN (hypertension) 10/10/2015   Hyperlipidemia 09/25/2015   Schizophrenia, undifferentiated ([place])    Diabetes ([place]) 09/16/2015   Hypothyroid 09/16/2015     Physical Exam  Triage Vital Signs: ED Triage Vitals  Enc Vitals Group     BP 08/10/21 1641 (!) 187/122     Pulse Rate 08/10/21 1641 89     Resp 08/10/21 1641 20     Temp 08/10/21 1641 98.7 F (37.1 C)     Temp Source 08/10/21 1641 Oral      SpO2 08/10/21 1641 100 %     Weight 08/10/21 1643 214 lb 6.4 oz (97.3 kg)     Height --      Head Circumference --      Peak Flow --      Pain Score 08/10/21 1643 0     Pain Loc --      Pain Edu? --      Excl. in [place]? --     Most recent vital signs: Vitals:   08/10/21 1955 08/10/21 2055  BP: (!) 184/160 (!) 163/89  Pulse: 76 93  Resp: 20 16  Temp:    SpO2: 100% 100%     General: Awake, no distress.  Appears disheveled CV:  Good peripheral perfusion.  Resp:  Normal effort.  Abd:  No distention.  Neuro:             Patient is awake, eyes open, does not answer questions appropriately or follow commands, is moving all of his extremities symmetrically Other:     ED Results / Procedures / Treatments  Labs (all labs ordered are listed, but only abnormal results are displayed) Labs Reviewed  COMPREHENSIVE METABOLIC PANEL - Abnormal; Notable for the following components:      Result Value   Glucose, Bld 125 (*)    All other components within normal limits  VALPROIC ACID LEVEL -  Abnormal; Notable for the following components:   Valproic Acid Lvl 13 (*)    All other components within normal limits  RESP PANEL BY RT-PCR (FLU A&B, COVID) ARPGX2  CBC WITH DIFFERENTIAL/PLATELET  AMMONIA  URINE DRUG SCREEN, QUALITATIVE ([facility] ONLY)  URINALYSIS, COMPLETE (UACMP) WITH MICROSCOPIC  CBG MONITORING, ED     EKG     RADIOLOGY I reviewed the CT scan of the brain which does not show any acute intracranial process; agree with radiology report     PROCEDURES:  Critical Care performed: No  .1-3 Lead EKG Interpretation Performed by: Rada Hay, MD Authorized by: Rada Hay, MD     Interpretation: normal     ECG rate assessment: normal     Rhythm: sinus rhythm     Ectopy: none     Conduction: normal    The patient is on the cardiac monitor to evaluate for evidence of arrhythmia and/or significant heart rate changes.   MEDICATIONS ORDERED IN  ED: Medications  aspirin EC tablet 81 mg (has no administration in time range)  benztropine (COGENTIN) tablet 1 mg (has no administration in time range)  clonazePAM (KLONOPIN) tablet 0.5 mg (has no administration in time range)  divalproex (DEPAKOTE SPRINKLE) capsule 1,000 mg (has no administration in time range)  folic acid (FOLVITE) tablet 1 mg (has no administration in time range)  haloperidol (HALDOL) tablet 20 mg (has no administration in time range)  hydrOXYzine (ATARAX) tablet 50 mg (has no administration in time range)  levothyroxine (SYNTHROID) tablet 125 mcg (has no administration in time range)  metFORMIN (GLUCOPHAGE) tablet 1,000 mg (has no administration in time range)  OLANZapine zydis (ZYPREXA) disintegrating tablet 20 mg (has no administration in time range)  simvastatin (ZOCOR) tablet 20 mg (has no administration in time range)  traZODone (DESYREL) tablet 150 mg (has no administration in time range)  vitamin B-12 (CYANOCOBALAMIN) tablet 1,000 mcg (has no administration in time range)  midazolam (VERSED) injection 2 mg (2 mg Intramuscular Given 08/10/21 1658)  haloperidol lactate (HALDOL) injection 2 mg (2 mg Intramuscular Given 08/10/21 1659)  LORazepam (ATIVAN) injection 2 mg (2 mg Intravenous Given 08/10/21 1901)  haloperidol lactate (HALDOL) injection 5 mg (5 mg Intravenous Given 08/10/21 1901)     IMPRESSION / MDM / ASSESSMENT AND PLAN / ED COURSE  I reviewed the triage vital signs and the nursing notes.                              Differential diagnosis includes, but is not limited to, metabolic abnormality, substance induced, decompensated psychosis, intracranial hemorrhage, infection  Patient is a 66 year old male with known schizophrenia and prior noncompliance and agitation who presents today after a possible seizure at his group home and not being at his baseline afterward.  They reported that he had focal shaking of the left leg and then was slapping himself.   Normally he is alert and oriented and he was not able to answer questions appropriately after the event.  On my evaluation patient is awake but does not answer questions appropriately, repeating phrases and not following commands.  Refusing to comply with labs and imaging so was given 2 mg of Versed and 5 mg of IM Haldol.  Did not require any restraint.  Vital signs are notable for hypertension.  He does not cooperate for neurologic exam but has normal pupillary exam and seems to be moving all of his extremities symmetrically.  We will obtain broad labs and CT head to rule out any organic cause of his change in mental status.  Patient was refusing CAT scan, sitting up in bed somewhat agitated so he was given 5 mg of Haldol and 2 mg of Ativan IV facilitate CT which was able to be obtained and is negative.  Patient's lab work all reassuring.  Depakote level is subtherapeutic at 13.  Discussed patient's care with his group home who notes that normally he is able to have a conversation today he was not responding appropriately after the event.  Question whether he was postictal.  On repeat evaluation patient much more calm able to have somewhat of a lucid conversation to me.  Does express some paranoid thoughts about the group home and that he would like to go to a different group home because he thinks there may be more stable home.  Also writing in his journal.  This seems to be more like the patient's baseline to me.  We will have psychiatry see him as well.  We will give him his p.o. meds especially the Depakote. Clinical Course as of 08/10/21 2102  Fri Aug 10, 2021  2056 BP(!): 184/160 [KM]    Clinical Course User Index [KM] Rada Hay, MD     FINAL CLINICAL IMPRESSION(S) / ED DIAGNOSES   Final diagnoses:  Seizure-like activity ([place])  Schizophrenia, unspecified type [facility])     Rx / DC Orders   ED Discharge Orders     None        Note:  This document was prepared using Dragon  voice recognition software and may include unintentional dictation errors.   Rada Hay, MD 08/10/21 2102

## 2021-08-10 NOTE — ED Notes (Signed)
Pt remains non-compliant and moves arm during B/P check. EDP aware of pt status. See new medication orders.

## 2021-08-10 NOTE — BH Assessment (Signed)
[facility] contacted this Clinical research associate and provides verbal consent for Inpatient treatment

## 2021-08-10 NOTE — Consult Note (Signed)
[facility] Face-to-Face Psychiatry Consult   Reason for Consult:  Psych evaluation Referring Physician:  Dr. Sidney Ace Patient Identification: [name] MRN:  [id number] Principal Diagnosis: <principal problem not specified> Diagnosis:  Active Problems:   * No active hospital problems. *   Total Time spent with patient: 1 hour HPI:  Tele psych Assessment   [name], 66 y.o., male patient seen  by [name] and this provider; chart reviewed and consulted with Dr. Sidney Ace on 08/10/21.  Per clinician, pt alert and talking about church and religious topics, but at pt's baseline pt is normally able to answer questions appropriately. During assessment patient appears alert and oriented x1, patient is not quite oriented to where he is or the situation. When asked if patient knows where he is he reports being in the [place] but unable to report that he is in the hospital, he is able to report the current president.  When asked if he knows where he lives he reports an address, unsure if that address is the address to his current group home. When asked most questions patient is slow to respond and his thoughts are disorganized and mainly focused on religion. When asked if he is experiencing AH he again answers as "I hear the Christ of God." Before the psyc team arrived patient had reported to his nurse that he hears "a mix" of voices and asked if this was a punishment from God.    Collateral information obtained from patient's group home administrator Jimmye Norman [phone] who reports "he is always non compliant with his medications, we have to crush it and put it in his food." "We took him to the doctor because the doctor thought he had a UTI and prescribed him antibiotics and he finished all of those but it wasn't a UTI, the other day he wasn't acting like himself and then today he was hitting himself in the head and I told the staff to call 911."  During evaluation [name] is laying in bed fidgeting.  he is alert/oriented x1 to self; he is calm/cooperative/confused; and mood congruent with affect.  Patient is speaking in a low tone, blocked moderate volume, and slow pace; with fair eye contact.  His thought process is incoherent and irrelevant; Patient endorses AH.  Patient poor historian.   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 No pertinent surgical history. Family History: No family history on file. Family Psychiatric  History: unknown Social History:  Social History   Substance and Sexual Activity  Alcohol Use No     Social History   Substance and Sexual Activity  Drug Use Not Currently    Social History   Socioeconomic History   Marital status: Single    Spouse name: Not on file   Number of children: Not on file   Years of education: Not on file   Highest education level:  Not on file  Occupational History   Not on file  Tobacco Use   Smoking status: Never   Smokeless tobacco: Never  Vaping Use   Vaping Use: Never used  Substance and Sexual Activity   Alcohol use: No   Drug use: Not Currently   Sexual activity: Not Currently  Other Topics Concern   Not on file  Social History Narrative   Not on file   Social Determinants of Health   Financial Resource Strain: Not on file  Food Insecurity: Not on file  Transportation Needs: Not on file  Physical Activity: Not on file  Stress: Not on file  Social Connections: Not on  file   Additional Social History:    Allergies:   Allergies  Allergen Reactions   Depakote [Valproic Acid] Other (See Comments)    Reaction:  Hyperammonemia    Lithium Other (See Comments)    Pt has a prior history of Lithium toxicity.       Labs:  Results for orders placed or performed during the hospital encounter of 08/10/21 (from the past 48 hour(s))  Comprehensive metabolic panel     Status: Abnormal   Collection Time: 08/10/21  5:52 PM  Result Value Ref Range   Sodium 142 135 - 145 mmol/L   Potassium 3.7 3.5 - 5.1 mmol/L   Chloride 106 98 - 111 mmol/L   CO2 26 22 - 32 mmol/L   Glucose, Bld 125 (H) 70 - 99 mg/dL    Comment: Glucose reference range applies only to samples taken after fasting for at least 8 hours.   BUN 22 8 - 23 mg/dL   Creatinine, Ser 1.611.13 0.61 - 1.24 mg/dL   Calcium 8.9 8.9 - 09.610.3 mg/dL   Total Protein 7.6 6.5 - 8.1 g/dL   Albumin 3.9 3.5 - 5.0 g/dL   AST 24 15 - 41 U/L   ALT 10 0 - 44 U/L   Alkaline Phosphatase 55 38 - 126 U/L   Total Bilirubin 0.6 0.3 - 1.2 mg/dL   GFR, Estimated >04>60 >54>60 mL/min    Comment: (NOTE) Calculated using the CKD-EPI Creatinine Equation (2021)    Anion gap 10 5 - 15    Comment: Performed at [facility], [address] [place], [place] [address]  CBC with Differential     Status: None   Collection Time: 08/10/21  5:52 PM  Result Value Ref Range   WBC 4.7 4.0 - 10.5 K/uL   RBC 4.96 4.22 - 5.81 MIL/uL   Hemoglobin 13.6 13.0 - 17.0 g/dL   HCT 91.441.6 78.239.0 - 95.652.0 %   MCV 83.9 80.0 - 100.0 fL   MCH 27.4 26.0 - 34.0 pg   MCHC 32.7 30.0 - 36.0 g/dL   RDW 21.314.7 08.611.5 - 57.815.5 %   Platelets 221 150 - 400 K/uL   nRBC 0.0 0.0 - 0.2 %   Neutrophils Relative % 57 %   Neutro Abs 2.7 1.7 - 7.7 K/uL   Lymphocytes Relative 22 %   Lymphs Abs 1.0 0.7 - 4.0 K/uL   Monocytes Relative 19 %   Monocytes Absolute 0.9 0.1 - 1.0 K/uL   Eosinophils Relative 2 %   Eosinophils Absolute 0.1 0.0 - 0.5 K/uL   Basophils Relative 0 %    Basophils Absolute 0.0 0.0 - 0.1 K/uL   Immature Granulocytes 0 %   Abs Immature Granulocytes 0.01 0.00 - 0.07 K/uL    Comment: Performed at [facility], [address] [place], [place] [address]  Ammonia     Status: None   Collection Time: 08/10/21  5:52 PM  Result Value Ref Range   Ammonia 31 9 - 35 umol/L    Comment: HEMOLYSIS AT THIS LEVEL MAY AFFECT RESULT Performed at [facility], [address] [place], [place] [address]   Resp Panel by RT-PCR (Flu A&B, Covid) Nasopharyngeal Swab     Status: None   Collection Time: 08/10/21  5:52 PM   Specimen: Nasopharyngeal Swab; Nasopharyngeal(NP) swabs in vial transport medium  Result Value Ref Range   SARS Coronavirus 2 by RT PCR NEGATIVE NEGATIVE    Comment: (NOTE) SARS-CoV-2 target nucleic acids are NOT DETECTED.  The SARS-CoV-2 RNA is generally detectable in upper respiratory specimens during the acute phase of infection. The lowest concentration of SARS-CoV-2 viral copies this assay can detect is 138 copies/mL. A negative result does not preclude SARS-Cov-2 infection and should not be used as the sole basis for treatment or other patient management decisions. A negative result may occur with  improper specimen collection/handling, submission of specimen other than nasopharyngeal swab, presence of viral mutation(s) within the areas targeted by this assay, and inadequate number of viral copies(<138 copies/mL). A negative result must be combined with clinical observations, patient history, and epidemiological information. The expected result is Negative.  Fact Sheet for Patients:  BloggerCourse.com  Fact Sheet for Healthcare Providers:  SeriousBroker.it  This test is no t yet approved or cleared by the [place] FDA and  has been authorized for detection and/or diagnosis of SARS-CoV-2 by FDA under an Emergency Use Authorization (EUA). This EUA will remain   in effect (meaning this test can be used) for the duration of the COVID-19 declaration under Section 564(b)(1) of the Act, 21 U.S.C.section 360bbb-3(b)(1), unless the authorization is terminated  or revoked sooner.       Influenza A by PCR NEGATIVE NEGATIVE   Influenza B by PCR NEGATIVE NEGATIVE    Comment: (NOTE) The Xpert Xpress SARS-CoV-2/FLU/RSV plus assay is intended as an aid in the diagnosis of influenza from Nasopharyngeal swab specimens and should not be used as a sole basis for treatment. Nasal washings and aspirates are unacceptable for Xpert Xpress SARS-CoV-2/FLU/RSV testing.  Fact Sheet for Patients: BloggerCourse.com  Fact Sheet for Healthcare Providers: SeriousBroker.it  This test is not yet approved or cleared by the [place] FDA and has been authorized for detection and/or diagnosis of SARS-CoV-2 by FDA under an Emergency Use Authorization (EUA). This EUA will remain in effect (meaning this test can be used) for the duration of the COVID-19 declaration under Section 564(b)(1) of the Act, 21 U.S.C. section 360bbb-3(b)(1), unless the authorization is terminated or revoked.  Performed at [facility], [address] [place], [place] [address]   Urine Drug Screen, Qualitative [facility] only)     Status: Abnormal   Collection Time: 08/10/21  5:53 PM  Result Value Ref Range   Tricyclic, Ur Screen NONE DETECTED NONE DETECTED   Amphetamines, Ur Screen NONE DETECTED NONE DETECTED   MDMA (Ecstasy)Ur Screen NONE DETECTED NONE DETECTED   Cocaine Metabolite,Ur [place] NONE DETECTED NONE DETECTED   Opiate, Ur Screen NONE DETECTED NONE DETECTED   Phencyclidine (PCP) Ur S NONE DETECTED NONE DETECTED   Cannabinoid 50 Ng, Ur [place] NONE DETECTED NONE DETECTED   Barbiturates, Ur Screen NONE DETECTED NONE DETECTED   Benzodiazepine, Ur Scrn POSITIVE (A) NONE DETECTED   Methadone Scn, Ur NONE DETECTED NONE DETECTED  Comment: (NOTE) Tricyclics + metabolites, urine    Cutoff 1000 ng/mL Amphetamines + metabolites, urine  Cutoff 1000 ng/mL MDMA (Ecstasy), urine              Cutoff 500 ng/mL Cocaine Metabolite, urine          Cutoff 300 ng/mL Opiate + metabolites, urine        Cutoff 300 ng/mL Phencyclidine (PCP), urine         Cutoff 25 ng/mL Cannabinoid, urine                 Cutoff 50 ng/mL Barbiturates + metabolites, urine  Cutoff 200 ng/mL Benzodiazepine, urine              Cutoff 200 ng/mL Methadone, urine                   Cutoff 300 ng/mL  The urine drug screen provides only a preliminary, unconfirmed analytical test result and should not be used for non-medical purposes. Clinical consideration and professional judgment should be applied to any positive drug screen result due to possible interfering substances. A more specific alternate chemical method must be used in order to obtain a confirmed analytical result. Gas chromatography / mass spectrometry (GC/MS) is the preferred confirm atory method. Performed at [facility], [address] [place], [place] [address]   Urinalysis, Complete w Microscopic     Status: Abnormal   Collection Time: 08/10/21  5:53 PM  Result Value Ref Range   Color, Urine YELLOW (A) YELLOW   APPearance CLEAR (A) CLEAR   Specific Gravity, Urine 1.020 1.005 - 1.030   pH 8.5 (H) 5.0 - 8.0   Glucose, UA NEGATIVE NEGATIVE mg/dL   Hgb urine dipstick NEGATIVE NEGATIVE   Bilirubin Urine NEGATIVE NEGATIVE   Ketones, ur TRACE (A) NEGATIVE mg/dL   Protein, ur NEGATIVE NEGATIVE mg/dL   Nitrite NEGATIVE NEGATIVE   Leukocytes,Ua NEGATIVE NEGATIVE   RBC / HPF 0-5 0 - 5 RBC/hpf   WBC, UA 0-5 0 - 5 WBC/hpf   Bacteria, UA NONE SEEN NONE SEEN   Squamous Epithelial / LPF NONE SEEN 0 - 5   Mucus PRESENT     Comment: Performed at [facility], [address] [place], [place] [address]  Valproic acid level     Status: Abnormal   Collection Time: 08/10/21   6:57 PM  Result Value Ref Range   Valproic Acid Lvl 13 (L) 50.0 - 100.0 ug/mL    Comment: Performed at [facility], [address], [place], [place] [address]    Current Facility-Administered Medications  Medication Dose Route Frequency Provider Last Rate Last Admin   aspirin EC tablet 81 mg  81 mg Oral Daily Georga Hacking, MD   81 mg at 08/10/21 2119   [START ON 08/11/2021] benztropine (COGENTIN) tablet 1 mg  1 mg Oral BID WC Georga Hacking, MD       [START ON 08/11/2021] clonazePAM [facility]) tablet 0.5 mg  0.5 mg Oral BID WC Georga Hacking, MD       divalproex (DEPAKOTE SPRINKLE) capsule 1,000 mg  1,000 mg Oral BID Georga Hacking, MD   1,000 mg at 08/10/21 2150   folic acid (FOLVITE) tablet 1 mg  1 mg Oral Daily Georga Hacking, MD   1 mg at 08/10/21 2119   haloperidol (HALDOL) tablet 20 mg  20 mg Oral BID Georga Hacking, MD  hydrOXYzine (ATARAX) tablet 50 mg  50 mg Oral BID PRN Georga Hacking, MD       [START ON 08/11/2021] levothyroxine (SYNTHROID) tablet 125 mcg  125 mcg Oral QAC breakfast Georga Hacking, MD       [START ON 08/11/2021] metFORMIN (GLUCOPHAGE) tablet 1,000 mg  1,000 mg Oral BID WC Georga Hacking, MD       [START ON 08/11/2021] OLANZapine zydis (ZYPREXA) disintegrating tablet 20 mg  20 mg Oral q AM Georga Hacking, MD       simvastatin (ZOCOR) tablet 20 mg  20 mg Oral QHS Georga Hacking, MD   20 mg at 08/10/21 2149   traZODone (DESYREL) tablet 150 mg  150 mg Oral QHS Georga Hacking, MD   150 mg at 08/10/21 2150   vitamin B-12 (CYANOCOBALAMIN) tablet 1,000 mcg  1,000 mcg Oral Daily Georga Hacking, MD   1,000 mcg at 08/10/21 2149   Current Outpatient Medications  Medication Sig Dispense Refill   aspirin EC 81 MG tablet Take 81 mg by mouth daily.     benztropine (COGENTIN) 1 MG tablet Take 1 tablet (1 mg total) by mouth 2 (two) times daily with a meal. 60 tablet 2   clonazePAM (KLONOPIN) 0.5 MG tablet Take 1 tablet  (0.5 mg total) by mouth 2 (two) times daily with a meal. (Patient taking differently: Take 0.5 mg by mouth 2 (two) times daily.) 60 tablet 0   divalproex (DEPAKOTE SPRINKLE) 125 MG capsule Take 1,000 mg by mouth 2 (two) times daily.     folic acid (FOLVITE) 1 MG tablet Take 1 mg by mouth daily.     haloperidol (HALDOL) 10 MG tablet Take 1.5 tablets (15 mg total) by mouth 2 (two) times daily with a meal. (Patient taking differently: Take 20 mg by mouth 2 (two) times daily.) 90 tablet 2   hydrOXYzine (ATARAX) 50 MG tablet Take 50 mg by mouth 2 (two) times daily as needed for anxiety.     levothyroxine (SYNTHROID, LEVOTHROID) 125 MCG tablet Take 1 tablet (125 mcg total) by mouth daily before breakfast. 30 tablet 0   metFORMIN (GLUCOPHAGE) 1000 MG tablet Take 1 tablet (1,000 mg total) by mouth 2 (two) times daily with a meal. 60 tablet 0   OLANZapine zydis (ZYPREXA) 20 MG disintegrating tablet Take 1 tablet (20 mg total) by mouth 2 (two) times daily with a meal. (Patient taking differently: Take 20 mg by mouth in the morning.) 60 tablet 2   simvastatin (ZOCOR) 20 MG tablet Take 20 mg by mouth at bedtime.     traZODone (DESYREL) 150 MG tablet Take 150 mg by mouth at bedtime.     vitamin B-12 (CYANOCOBALAMIN) 1000 MCG tablet Take 1,000 mcg by mouth daily.      Musculoskeletal: Strength & Muscle Tone: within normal limits Gait & Station:  unknown Patient leans: N/A            Psychiatric Specialty Exam:  Presentation  General Appearance: Bizarre; Disheveled  Eye Contact:Fair  Speech:Slow; Blocked  Speech Volume:Decreased  Handedness:Right   Mood and Affect  Mood:Irritable  Affect:Constricted   Thought Process  Thought Processes:Disorganized  Descriptions of Associations:Loose  Orientation:Partial  Thought Content:Scattered  History of Schizophrenia/Schizoaffective disorder:Yes  Duration of Psychotic Symptoms:Greater than six months  Hallucinations:Hallucinations:  Auditory Description of Auditory Hallucinations: hears the voice of Jesus  Ideas of Reference:Paranoia  Suicidal Thoughts:Suicidal Thoughts: No  Homicidal Thoughts:Homicidal Thoughts: No   Sensorium  Memory:Recent Poor;  Remote Poor  Judgment:Impaired  Insight:Poor   Executive Functions  Concentration:Poor  Attention Span:Poor  Recall:Poor  Fund of Knowledge:Poor  Language:Poor   Psychomotor Activity  Psychomotor Activity:Psychomotor Activity: Decreased   Assets  Assets:Social Support; Housing   Sleep  Sleep:Sleep: Fair   Physical Exam: Physical Exam Vitals and nursing note reviewed.  HENT:     Head: Normocephalic and atraumatic.     Nose: Nose normal.  Eyes:     Pupils: Pupils are equal, round, and reactive to light.  Pulmonary:     Effort: Pulmonary effort is normal.  Musculoskeletal:        General: Normal range of motion.     Cervical back: Normal range of motion.  Skin:    General: Skin is warm and dry.  Neurological:     Mental Status: He is alert.  Psychiatric:        Attention and Perception: Attention normal. He perceives auditory hallucinations.        Mood and Affect: Affect is flat.        Speech: Speech is delayed.        Behavior: Behavior is slowed. Behavior is cooperative.        Thought Content: Thought content does not include suicidal ideation. Thought content does not include homicidal plan.        Cognition and Memory: Cognition is impaired. Memory is impaired.   Review of Systems  Psychiatric/Behavioral:  Positive for hallucinations. Negative for depression.   All other systems reviewed and are negative. Blood pressure (!) 163/89, pulse 93, temperature 98.7 F (37.1 C), temperature source Oral, resp. rate 16, weight 97.3 kg, SpO2 100 %. Body mass index is 26.8 kg/m.  Treatment Plan Summary: Daily contact with patient to assess and evaluate symptoms and progress in treatment and Medication management  Disposition:  Recommend psychiatric Inpatient admission when medically cleared. Supportive therapy provided about ongoing stressors. Discussed crisis plan, support from social network, calling 911, coming to the Emergency Department, and calling Suicide Hotline.  Jearld Leschashaun M Bettejane Leavens, NP 08/10/2021 11:04 PM

## 2021-08-11 DIAGNOSIS — F203 Undifferentiated schizophrenia: Secondary | ICD-10-CM | POA: Diagnosis not present

## 2021-08-11 DIAGNOSIS — R569 Unspecified convulsions: Secondary | ICD-10-CM | POA: Diagnosis not present

## 2021-08-11 DIAGNOSIS — F209 Schizophrenia, unspecified: Secondary | ICD-10-CM | POA: Diagnosis not present

## 2021-08-11 NOTE — ED Provider Notes (Addendum)
Discussed with psychiatry clear to go back to group home.  Group home agreeable.  Patient could have had a breakthrough seizure given he has had no additional seizure-like activity for 20 hours we will hold off on changing medications-have him follow-up with neurology   Concha Se, MD 08/11/21 1131    Concha Se, MD 08/11/21 [phone]

## 2021-08-11 NOTE — ED Notes (Signed)
VOL/pending inpatient psych admission when medically cleared 

## 2021-08-11 NOTE — ED Provider Notes (Signed)
Patient was evaluated by psychiatry who recommends inpatient admission.  Home meds ordered.   Georga Hacking, MD 08/11/21 Ivor Reining

## 2021-08-11 NOTE — ED Notes (Signed)
Joy from [facility] gave verbal permission to send patient back to group home in a cab.

## 2021-08-11 NOTE — ED Notes (Signed)
Patient given lunch tray.

## 2021-08-11 NOTE — ED Notes (Addendum)
RN called on-call [facility] DSS social work phone number [phone]).   RN waiting for a call back to get verbal permission to send patient back to group home in a cab. Charge nurse made aware.

## 2021-08-11 NOTE — Discharge Instructions (Addendum)
Patient can follow-up with neurology.  He can call them to make an appointment if he does not have a neurologist.  His CT head was negative and he was acting at his baseline self and cleared by psychiatry

## 2021-08-11 NOTE — ED Notes (Signed)
RN has called [employer] cab.  Pt will be held in his room until cab arrives due to pt having legal guardian.

## 2021-08-11 NOTE — Consult Note (Signed)
[facility] Face-to-Face Psychiatry Consult   Reason for Consult:  Psych evaluation Referring Physician:  Dr. Starleen Blue Patient Identification: [name] MRN:  [id number] Principal Diagnosis: Schizophrenia [facility]) Diagnosis:  Principal Problem:   Schizophrenia ([place]) Active Problems:   Seizure ([place])   Total Time spent with patient: 30 minutes  HPI:   [name], 66 y.o., male patient with history of schizophrenia presented to the ED after a questionable seizure (seizure history) and found by staff at his group home hitting himself in the head with confusion.  Initially, he could not answer questions on admission last night.  Today, he took all of his medications from the RN without any issues.  Evidently, medications have to be crushed at the group home for him to take them, per the notes.  [name] and this provider met with him and he was calm, cooperative, and states, "I feel fine.  My creator is good."  He was able to tell the team that he lived on [facility] which is correct.  Denies suicidal/homicidal ideations, hallucinations, and substance abuse.  The group home owner, [name], was contacted by [name] and she did not have any mental health concerns.  She was concerned that earlier this week his behaviors changed and his MD ordered an antibiotic, thinking he had a UTI.  She reports they have a contract with a cab company who will come and get him, psychiatrically stable for discharge.  Caveat:  The psych team has known this client well for years, at his baseline.  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 No pertinent surgical history. Family History: No family history on file. Family Psychiatric  History: unknown Social History:  Social History   Substance and Sexual Activity  Alcohol Use No     Social History   Substance and Sexual Activity  Drug Use Not Currently    Social History   Socioeconomic History   Marital status: Single    Spouse name: Not on file   Number of children: Not on file   Years of education: Not on file   Highest education level: Not on file  Occupational History   Not on file  Tobacco Use   Smoking status: Never   Smokeless tobacco: Never  Vaping Use   Vaping Use: Never used  Substance and Sexual Activity   Alcohol use: No   Drug use: Not Currently   Sexual activity: Not Currently  Other Topics Concern   Not on file  Social History Narrative   Not on file   Social Determinants of Health   Financial Resource Strain: Not on file  Food Insecurity: Not on file  Transportation Needs: Not on file  Physical Activity: Not on file  Stress: Not on file  Social Connections: Not on file   Additional Social History:    Allergies:   Allergies  Allergen Reactions   Depakote [Valproic Acid] Other (See Comments)  Reaction:  Hyperammonemia    Lithium Other (See Comments)    Pt has a prior history of Lithium toxicity.       Labs:  Results for orders placed or performed during the hospital encounter of 08/10/21 (from the past 48 hour(s))  Comprehensive metabolic panel     Status: Abnormal   Collection Time: 08/10/21  5:52 PM  Result Value Ref Range   Sodium 142 135 - 145 mmol/L   Potassium 3.7 3.5 - 5.1 mmol/L   Chloride 106 98 - 111 mmol/L   CO2 26 22 - 32 mmol/L   Glucose, Bld 125 (H) 70 - 99 mg/dL    Comment: Glucose reference range applies only to samples taken after fasting for at least 8 hours.   BUN 22 8 -  23 mg/dL   Creatinine, Ser 1.13 0.61 - 1.24 mg/dL   Calcium 8.9 8.9 - 10.3 mg/dL   Total Protein 7.6 6.5 - 8.1 g/dL   Albumin 3.9 3.5 - 5.0 g/dL   AST 24 15 - 41 U/L   ALT 10 0 - 44 U/L   Alkaline Phosphatase 55 38 - 126 U/L   Total Bilirubin 0.6 0.3 - 1.2 mg/dL   GFR, Estimated >60 >60 mL/min    Comment: (NOTE) Calculated using the CKD-EPI Creatinine Equation (2021)    Anion gap 10 5 - 15    Comment: Performed at [facility], [address] [place], [place] [address]  CBC with Differential     Status: None   Collection Time: 08/10/21  5:52 PM  Result Value Ref Range   WBC 4.7 4.0 - 10.5 K/uL   RBC 4.96 4.22 - 5.81 MIL/uL   Hemoglobin 13.6 13.0 - 17.0 g/dL   HCT 41.6 39.0 - 52.0 %   MCV 83.9 80.0 - 100.0 fL   MCH 27.4 26.0 - 34.0 pg   MCHC 32.7 30.0 - 36.0 g/dL   RDW 14.7 11.5 - 15.5 %   Platelets 221 150 - 400 K/uL   nRBC 0.0 0.0 - 0.2 %   Neutrophils Relative % 57 %   Neutro Abs 2.7 1.7 - 7.7 K/uL   Lymphocytes Relative 22 %   Lymphs Abs 1.0 0.7 - 4.0 K/uL   Monocytes Relative 19 %   Monocytes Absolute 0.9 0.1 - 1.0 K/uL   Eosinophils Relative 2 %   Eosinophils Absolute 0.1 0.0 - 0.5 K/uL   Basophils Relative 0 %   Basophils Absolute 0.0 0.0 - 0.1 K/uL   Immature Granulocytes 0 %   Abs Immature Granulocytes 0.01 0.00 - 0.07 K/uL    Comment: Performed at [facility], [address] [place], [place] [address]  Ammonia     Status: None   Collection Time: 08/10/21  5:52 PM  Result Value Ref Range   Ammonia 31 9 - 35 umol/L    Comment: HEMOLYSIS AT THIS LEVEL MAY AFFECT RESULT Performed at [facility], [address] [place], [place] [address]   Resp Panel by RT-PCR (Flu A&B, Covid) Nasopharyngeal Swab     Status: None   Collection Time: 08/10/21  5:52 PM   Specimen: Nasopharyngeal Swab; Nasopharyngeal(NP) swabs in vial transport medium  Result Value Ref Range   SARS Coronavirus 2 by RT PCR NEGATIVE NEGATIVE    Comment:  (NOTE) SARS-CoV-2 target nucleic acids are NOT DETECTED.  The SARS-CoV-2 RNA is generally detectable in upper respiratory specimens during the acute phase of infection. The lowest concentration of SARS-CoV-2  viral copies this assay can detect is 138 copies/mL. A negative result does not preclude SARS-Cov-2 infection and should not be used as the sole basis for treatment or other patient management decisions. A negative result may occur with  improper specimen collection/handling, submission of specimen other than nasopharyngeal swab, presence of viral mutation(s) within the areas targeted by this assay, and inadequate number of viral copies(<138 copies/mL). A negative result must be combined with clinical observations, patient history, and epidemiological information. The expected result is Negative.  Fact Sheet for Patients:  EntrepreneurPulse.com.au  Fact Sheet for Healthcare Providers:  IncredibleEmployment.be  This test is no t yet approved or cleared by the [place] FDA and  has been authorized for detection and/or diagnosis of SARS-CoV-2 by FDA under an Emergency Use Authorization (EUA). This EUA will remain  in effect (meaning this test can be used) for the duration of the COVID-19 declaration under Section 564(b)(1) of the Act, 21 U.S.C.section 360bbb-3(b)(1), unless the authorization is terminated  or revoked sooner.       Influenza A by PCR NEGATIVE NEGATIVE   Influenza B by PCR NEGATIVE NEGATIVE    Comment: (NOTE) The Xpert Xpress SARS-CoV-2/FLU/RSV plus assay is intended as an aid in the diagnosis of influenza from Nasopharyngeal swab specimens and should not be used as a sole basis for treatment. Nasal washings and aspirates are unacceptable for Xpert Xpress SARS-CoV-2/FLU/RSV testing.  Fact Sheet for Patients: EntrepreneurPulse.com.au  Fact Sheet for Healthcare  Providers: IncredibleEmployment.be  This test is not yet approved or cleared by the [place] FDA and has been authorized for detection and/or diagnosis of SARS-CoV-2 by FDA under an Emergency Use Authorization (EUA). This EUA will remain in effect (meaning this test can be used) for the duration of the COVID-19 declaration under Section 564(b)(1) of the Act, 21 U.S.C. section 360bbb-3(b)(1), unless the authorization is terminated or revoked.  Performed at [facility], [address], [place], [place] [address]   Urine Drug Screen, Qualitative [facility] only)     Status: Abnormal   Collection Time: 08/10/21  5:53 PM  Result Value Ref Range   Tricyclic, Ur Screen NONE DETECTED NONE DETECTED   Amphetamines, Ur Screen NONE DETECTED NONE DETECTED   MDMA (Ecstasy)Ur Screen NONE DETECTED NONE DETECTED   Cocaine Metabolite,Ur [place] NONE DETECTED NONE DETECTED   Opiate, Ur Screen NONE DETECTED NONE DETECTED   Phencyclidine (PCP) Ur S NONE DETECTED NONE DETECTED   Cannabinoid 50 Ng, Ur [place] NONE DETECTED NONE DETECTED   Barbiturates, Ur Screen NONE DETECTED NONE DETECTED   Benzodiazepine, Ur Scrn POSITIVE (A) NONE DETECTED   Methadone Scn, Ur NONE DETECTED NONE DETECTED    Comment: (NOTE) Tricyclics + metabolites, urine    Cutoff 1000 ng/mL Amphetamines + metabolites, urine  Cutoff 1000 ng/mL MDMA (Ecstasy), urine              Cutoff 500 ng/mL Cocaine Metabolite, urine          Cutoff 300 ng/mL Opiate + metabolites, urine        Cutoff 300 ng/mL Phencyclidine (PCP), urine         Cutoff 25 ng/mL Cannabinoid, urine                 Cutoff 50 ng/mL Barbiturates + metabolites, urine  Cutoff 200 ng/mL Benzodiazepine, urine              Cutoff 200 ng/mL Methadone, urine  Cutoff 300 ng/mL  The urine drug screen provides only a preliminary, unconfirmed analytical test result and should not be used for non-medical purposes. Clinical consideration and  professional judgment should be applied to any positive drug screen result due to possible interfering substances. A more specific alternate chemical method must be used in order to obtain a confirmed analytical result. Gas chromatography / mass spectrometry (GC/MS) is the preferred confirm atory method. Performed at [facility], [address] [place], [place] [address]   Urinalysis, Complete w Microscopic     Status: Abnormal   Collection Time: 08/10/21  5:53 PM  Result Value Ref Range   Color, Urine YELLOW (A) YELLOW   APPearance CLEAR (A) CLEAR   Specific Gravity, Urine 1.020 1.005 - 1.030   pH 8.5 (H) 5.0 - 8.0   Glucose, UA NEGATIVE NEGATIVE mg/dL   Hgb urine dipstick NEGATIVE NEGATIVE   Bilirubin Urine NEGATIVE NEGATIVE   Ketones, ur TRACE (A) NEGATIVE mg/dL   Protein, ur NEGATIVE NEGATIVE mg/dL   Nitrite NEGATIVE NEGATIVE   Leukocytes,Ua NEGATIVE NEGATIVE   RBC / HPF 0-5 0 - 5 RBC/hpf   WBC, UA 0-5 0 - 5 WBC/hpf   Bacteria, UA NONE SEEN NONE SEEN   Squamous Epithelial / LPF NONE SEEN 0 - 5   Mucus PRESENT     Comment: Performed at [facility], [address] [place], [place] [address]  Valproic acid level     Status: Abnormal   Collection Time: 08/10/21  6:57 PM  Result Value Ref Range   Valproic Acid Lvl 13 (L) 50.0 - 100.0 ug/mL    Comment: Performed at [facility], [address], [place], [place] [address]    Current Facility-Administered Medications  Medication Dose Route Frequency Provider Last Rate Last Admin   aspirin EC tablet 81 mg  81 mg Oral Daily Rada Hay, MD   81 mg at 08/11/21 4967   benztropine (COGENTIN) tablet 1 mg  1 mg Oral BID WC Rada Hay, MD   1 mg at 08/11/21 5916   clonazePAM (KLONOPIN) tablet 0.5 mg  0.5 mg Oral BID WC Rada Hay, MD   0.5 mg at 08/11/21 3846   divalproex (DEPAKOTE SPRINKLE) capsule 1,000 mg  1,000 mg Oral BID Rada Hay, MD   1,000 mg at 65/99/35 7017    folic acid (FOLVITE) tablet 1 mg  1 mg Oral Daily Rada Hay, MD   1 mg at 08/11/21 7939   haloperidol (HALDOL) tablet 20 mg  20 mg Oral BID Rada Hay, MD   20 mg at 08/11/21 0300   hydrOXYzine (ATARAX) tablet 50 mg  50 mg Oral BID PRN Rada Hay, MD       levothyroxine (SYNTHROID) tablet 125 mcg  125 mcg Oral QAC breakfast Rada Hay, MD   125 mcg at 08/11/21 9233   metFORMIN (GLUCOPHAGE) tablet 1,000 mg  1,000 mg Oral BID WC Rada Hay, MD   1,000 mg at 08/11/21 0906   OLANZapine zydis (ZYPREXA) disintegrating tablet 20 mg  20 mg Oral q AM Rada Hay, MD   20 mg at 08/11/21 0911   simvastatin (ZOCOR) tablet 20 mg  20 mg Oral QHS Rada Hay, MD   20 mg at 08/10/21 2149   traZODone (DESYREL) tablet 150 mg  150 mg Oral QHS Rada Hay, MD   150 mg at 08/10/21 2150   vitamin B-12 (CYANOCOBALAMIN) tablet  1,000 mcg  1,000 mcg Oral Daily Rada Hay, MD   1,000 mcg at 08/11/21 8127   Current Outpatient Medications  Medication Sig Dispense Refill   aspirin EC 81 MG tablet Take 81 mg by mouth daily.     benztropine (COGENTIN) 1 MG tablet Take 1 tablet (1 mg total) by mouth 2 (two) times daily with a meal. 60 tablet 2   clonazePAM (KLONOPIN) 0.5 MG tablet Take 1 tablet (0.5 mg total) by mouth 2 (two) times daily with a meal. (Patient taking differently: Take 0.5 mg by mouth 2 (two) times daily.) 60 tablet 0   divalproex (DEPAKOTE SPRINKLE) 125 MG capsule Take 1,000 mg by mouth 2 (two) times daily.     folic acid (FOLVITE) 1 MG tablet Take 1 mg by mouth daily.     haloperidol (HALDOL) 10 MG tablet Take 1.5 tablets (15 mg total) by mouth 2 (two) times daily with a meal. (Patient taking differently: Take 20 mg by mouth 2 (two) times daily.) 90 tablet 2   hydrOXYzine (ATARAX) 50 MG tablet Take 50 mg by mouth 2 (two) times daily as needed for anxiety.     levothyroxine (SYNTHROID, LEVOTHROID) 125 MCG tablet Take 1 tablet (125 mcg total) by  mouth daily before breakfast. 30 tablet 0   metFORMIN (GLUCOPHAGE) 1000 MG tablet Take 1 tablet (1,000 mg total) by mouth 2 (two) times daily with a meal. 60 tablet 0   OLANZapine zydis (ZYPREXA) 20 MG disintegrating tablet Take 1 tablet (20 mg total) by mouth 2 (two) times daily with a meal. (Patient taking differently: Take 20 mg by mouth in the morning.) 60 tablet 2   simvastatin (ZOCOR) 20 MG tablet Take 20 mg by mouth at bedtime.     traZODone (DESYREL) 150 MG tablet Take 150 mg by mouth at bedtime.     vitamin B-12 (CYANOCOBALAMIN) 1000 MCG tablet Take 1,000 mcg by mouth daily.      Musculoskeletal: Strength & Muscle Tone: within normal limits Gait & Station:  unknown Patient leans: N/A   Psychiatric Specialty Exam: Physical Exam Vitals and nursing note reviewed.  HENT:     Head: Normocephalic and atraumatic.     Nose: Nose normal.  Pulmonary:     Effort: Pulmonary effort is normal.  Musculoskeletal:        General: Normal range of motion.     Cervical back: Normal range of motion.  Neurological:     General: No focal deficit present.     Mental Status: He is alert.  Psychiatric:        Attention and Perception: Attention normal.        Mood and Affect: Affect is flat.        Speech: Speech normal.        Behavior: Behavior is cooperative.        Thought Content: Thought content normal. Thought content does not include suicidal ideation. Thought content does not include homicidal plan.        Cognition and Memory: Cognition and memory normal.        Judgment: Judgment normal.    Review of Systems  Psychiatric/Behavioral:  Negative for depression.   All other systems reviewed and are negative.  Blood pressure 128/83, pulse 87, temperature 97.7 F (36.5 C), resp. rate 16, weight 97.3 kg, SpO2 100 %.Body mass index is 26.8 kg/m.  General Appearance: Casual  Eye Contact:  Good  Speech:  Normal Rate  Volume:  Increased  Mood:  Euthymic  Affect:  Flat  Thought  Process:  Coherent and Descriptions of Associations: Intact  Orientation:  Full (Time, Place, and Person)  Thought Content:  Logical  Suicidal Thoughts:  No  Homicidal Thoughts:  No  Memory:  Immediate;   Fair Recent;   Fair Remote;   Fair  Judgement:  Fair  Insight:  Fair  Psychomotor Activity:  Decreased  Concentration:  Concentration: Fair and Attention Span: Fair  Recall:  [employer] of Knowledge:  Fair  Language:  Good  Akathisia:  No  Handed:  Right  AIMS (if indicated):     Assets:  Housing Leisure Time Physical Health Resilience Social Support  ADL's:  Intact  Cognition:  WNL  Sleep:         Physical Exam: Physical Exam Vitals and nursing note reviewed.  HENT:     Head: Normocephalic and atraumatic.     Nose: Nose normal.  Pulmonary:     Effort: Pulmonary effort is normal.  Musculoskeletal:        General: Normal range of motion.     Cervical back: Normal range of motion.  Neurological:     General: No focal deficit present.     Mental Status: He is alert.  Psychiatric:        Attention and Perception: Attention normal.        Mood and Affect: Affect is flat.        Speech: Speech normal.        Behavior: Behavior is cooperative.        Thought Content: Thought content normal. Thought content does not include suicidal ideation. Thought content does not include homicidal plan.        Cognition and Memory: Cognition and memory normal.        Judgment: Judgment normal.   Review of Systems  Psychiatric/Behavioral:  Negative for depression.   All other systems reviewed and are negative. Blood pressure 128/83, pulse 87, temperature 97.7 F (36.5 C), resp. rate 16, weight 97.3 kg, SpO2 100 %. Body mass index is 26.8 kg/m.  Treatment Plan Summary: Continue medications for mental health. Schizophrenia, paranoid type: Zyprexa 20 mg daily per pharm reconciliation Haldol 20 mg BID  Anxiety: Klonopin 0.5 mg BID  EPS: Cogentin 1 mg  BID  Insomnia: Trazodone 150 mg daily at bedtime  Disposition: Discharge to his group home  Waylan Boga, NP 08/11/2021 11:09 AM

## 2021-08-11 NOTE — ED Provider Notes (Signed)
Emergency Medicine Observation Re-evaluation Note  [name] is a 66 y.o. male, seen on rounds today.  Pt initially presented to the ED for complaints of Seizures Currently, the patient is resting.  Physical Exam  BP (!) 163/89    Pulse 93    Temp 98.7 F (37.1 C) (Oral)    Resp 16    Wt 97.3 kg    SpO2 100%    BMI 26.80 kg/m  Physical Exam Gen:  No acute distress Resp:  Breathing easily and comfortably, no accessory muscle usage Neuro:  Moving all four extremities, no gross focal neuro deficits.  Ambulatory occasionally during the night without difficulty. Psych:  Resting currently, cooperative when awake.  ED Course / MDM  EKG:   I have reviewed the labs performed to date as well as medications administered while in observation.  Recent changes in the last 24 hours include initial EDP evaluation and psychiatric evaluation.  Plan  Current plan is for inpatient psychiatric treatment.  [name] is not under involuntary commitment.     Hinda Kehr, MD 08/11/21 [phone]

## 2021-08-11 NOTE — BH Assessment (Signed)
Writer spoke with the patient to complete an updated/reassessment. Patient denies SI/HI and AV/H. Patient was able to provide appropriate answers to the questions.  Writer spoke with the group home [name] [phone]), updated her about the patient progress and he was doing well. She shared she had no concerns about the patient. She states, call the cab company and they will transport him home. They have contract with them and they will bill there account.

## 2021-08-11 NOTE — ED Notes (Signed)
Pt given breakfast tray

## 2021-08-11 NOTE — ED Notes (Signed)
Pt discharged back to group home in cab.  All belongings returned to patient.

## 2021-09-17 ENCOUNTER — Emergency Department: Payer: Medicare Other

## 2021-09-17 ENCOUNTER — Inpatient Hospital Stay
Admission: EM | Admit: 2021-09-17 | Discharge: 2021-10-22 | DRG: 885 | Disposition: A | Discharge status: Home Health | Payer: Medicare Other | Attending: Internal Medicine | Admitting: Internal Medicine

## 2021-09-17 ENCOUNTER — Encounter: Payer: Self-pay | Admitting: *Deleted

## 2021-09-17 ENCOUNTER — Other Ambulatory Visit: Payer: Self-pay

## 2021-09-17 DIAGNOSIS — Z91148 Patient's other noncompliance with medication regimen for other reason: Secondary | ICD-10-CM

## 2021-09-17 DIAGNOSIS — I959 Hypotension, unspecified: Secondary | ICD-10-CM | POA: Diagnosis present

## 2021-09-17 DIAGNOSIS — I10 Essential (primary) hypertension: Secondary | ICD-10-CM | POA: Diagnosis present

## 2021-09-17 DIAGNOSIS — R338 Other retention of urine: Secondary | ICD-10-CM | POA: Diagnosis not present

## 2021-09-17 DIAGNOSIS — F202 Catatonic schizophrenia: Secondary | ICD-10-CM | POA: Diagnosis not present

## 2021-09-17 DIAGNOSIS — Z8673 Personal history of transient ischemic attack (TIA), and cerebral infarction without residual deficits: Secondary | ICD-10-CM

## 2021-09-17 DIAGNOSIS — L249 Irritant contact dermatitis, unspecified cause: Secondary | ICD-10-CM | POA: Diagnosis present

## 2021-09-17 DIAGNOSIS — E639 Nutritional deficiency, unspecified: Secondary | ICD-10-CM

## 2021-09-17 DIAGNOSIS — Z91199 Patient's noncompliance with other medical treatment and regimen due to unspecified reason: Secondary | ICD-10-CM

## 2021-09-17 DIAGNOSIS — E43 Unspecified severe protein-calorie malnutrition: Secondary | ICD-10-CM | POA: Diagnosis present

## 2021-09-17 DIAGNOSIS — G40901 Epilepsy, unspecified, not intractable, with status epilepticus: Secondary | ICD-10-CM | POA: Diagnosis present

## 2021-09-17 DIAGNOSIS — D72819 Decreased white blood cell count, unspecified: Secondary | ICD-10-CM | POA: Diagnosis not present

## 2021-09-17 DIAGNOSIS — E875 Hyperkalemia: Secondary | ICD-10-CM | POA: Diagnosis not present

## 2021-09-17 DIAGNOSIS — J690 Pneumonitis due to inhalation of food and vomit: Secondary | ICD-10-CM | POA: Diagnosis not present

## 2021-09-17 DIAGNOSIS — G9340 Encephalopathy, unspecified: Secondary | ICD-10-CM | POA: Diagnosis not present

## 2021-09-17 DIAGNOSIS — R0902 Hypoxemia: Secondary | ICD-10-CM

## 2021-09-17 DIAGNOSIS — E870 Hyperosmolality and hypernatremia: Secondary | ICD-10-CM | POA: Diagnosis not present

## 2021-09-17 DIAGNOSIS — R7303 Prediabetes: Secondary | ICD-10-CM | POA: Diagnosis present

## 2021-09-17 DIAGNOSIS — E119 Type 2 diabetes mellitus without complications: Secondary | ICD-10-CM

## 2021-09-17 DIAGNOSIS — R410 Disorientation, unspecified: Principal | ICD-10-CM

## 2021-09-17 DIAGNOSIS — R451 Restlessness and agitation: Secondary | ICD-10-CM | POA: Diagnosis present

## 2021-09-17 DIAGNOSIS — E785 Hyperlipidemia, unspecified: Secondary | ICD-10-CM | POA: Diagnosis present

## 2021-09-17 DIAGNOSIS — Z79899 Other long term (current) drug therapy: Secondary | ICD-10-CM

## 2021-09-17 DIAGNOSIS — H1031 Unspecified acute conjunctivitis, right eye: Secondary | ICD-10-CM

## 2021-09-17 DIAGNOSIS — J9601 Acute respiratory failure with hypoxia: Secondary | ICD-10-CM | POA: Diagnosis not present

## 2021-09-17 DIAGNOSIS — F209 Schizophrenia, unspecified: Secondary | ICD-10-CM | POA: Diagnosis present

## 2021-09-17 DIAGNOSIS — R339 Retention of urine, unspecified: Secondary | ICD-10-CM | POA: Diagnosis present

## 2021-09-17 DIAGNOSIS — T43596A Underdosing of other antipsychotics and neuroleptics, initial encounter: Secondary | ICD-10-CM | POA: Diagnosis present

## 2021-09-17 DIAGNOSIS — D573 Sickle-cell trait: Secondary | ICD-10-CM | POA: Diagnosis present

## 2021-09-17 DIAGNOSIS — T426X6A Underdosing of other antiepileptic and sedative-hypnotic drugs, initial encounter: Secondary | ICD-10-CM | POA: Diagnosis present

## 2021-09-17 DIAGNOSIS — E039 Hypothyroidism, unspecified: Secondary | ICD-10-CM | POA: Diagnosis present

## 2021-09-17 DIAGNOSIS — Y92099 Unspecified place in other non-institutional residence as the place of occurrence of the external cause: Secondary | ICD-10-CM

## 2021-09-17 DIAGNOSIS — Z20822 Contact with and (suspected) exposure to covid-19: Secondary | ICD-10-CM | POA: Diagnosis present

## 2021-09-17 DIAGNOSIS — Z6821 Body mass index (BMI) 21.0-21.9, adult: Secondary | ICD-10-CM

## 2021-09-17 DIAGNOSIS — Z79890 Hormone replacement therapy (postmenopausal): Secondary | ICD-10-CM

## 2021-09-17 DIAGNOSIS — R569 Unspecified convulsions: Secondary | ICD-10-CM

## 2021-09-17 DIAGNOSIS — Z781 Physical restraint status: Secondary | ICD-10-CM

## 2021-09-17 LAB — AMMONIA: Ammonia: 14 umol/L (ref 9–35)

## 2021-09-17 LAB — COMPREHENSIVE METABOLIC PANEL
ALT: 8 U/L (ref 0–44)
AST: 16 U/L (ref 15–41)
Albumin: 3.6 g/dL (ref 3.5–5.0)
Alkaline Phosphatase: 53 U/L (ref 38–126)
Anion gap: 9 (ref 5–15)
BUN: 26 mg/dL — ABNORMAL HIGH (ref 8–23)
CO2: 30 mmol/L (ref 22–32)
Calcium: 8.7 mg/dL — ABNORMAL LOW (ref 8.9–10.3)
Chloride: 102 mmol/L (ref 98–111)
Creatinine, Ser: 1.03 mg/dL (ref 0.61–1.24)
GFR, Estimated: >60 mL/min (ref >60)
Glucose, Bld: 97 mg/dL (ref 70–99)
Potassium: 4 mmol/L (ref 3.5–5.1)
Sodium: 141 mmol/L (ref 135–145)
Total Bilirubin: 0.5 mg/dL (ref 0.3–1.2)
Total Protein: 6.8 g/dL (ref 6.5–8.1)

## 2021-09-17 LAB — RESP PANEL BY RT-PCR (FLU A&B, COVID) ARPGX2
Influenza A by PCR: NEGATIVE
Influenza B by PCR: NEGATIVE
SARS Coronavirus 2 by RT PCR: NEGATIVE

## 2021-09-17 LAB — CBC
HCT: 40.7 % (ref 39.0–52.0)
Hemoglobin: 12.9 g/dL — ABNORMAL LOW (ref 13.0–17.0)
MCH: 26.5 pg (ref 26.0–34.0)
MCHC: 31.7 g/dL (ref 30.0–36.0)
MCV: 83.7 fL (ref 80.0–100.0)
Platelets: 164 10*3/uL (ref 150–400)
RBC: 4.86 MIL/uL (ref 4.22–5.81)
RDW: 14.6 % (ref 11.5–15.5)
WBC: 4.2 10*3/uL (ref 4.0–10.5)
nRBC: 0 % (ref 0.0–0.2)

## 2021-09-17 LAB — TSH: TSH: 0.969 u[IU]/mL (ref 0.350–4.500)

## 2021-09-17 LAB — BLOOD GAS, VENOUS

## 2021-09-17 MED ORDER — VITAMIN B-12 1000 MCG PO TABS: 1000.0000 ug | ORAL_TABLET | Freq: Every day | ORAL | Status: DC

## 2021-09-17 MED ORDER — HALOPERIDOL LACTATE 5 MG/ML IJ SOLN
5.0000 mg | Freq: Once | INTRAMUSCULAR | Status: AC
  Administered 2021-09-17: 5 mg via INTRAVENOUS
  Filled 2021-09-17: qty 1

## 2021-09-17 MED ORDER — HALOPERIDOL 5 MG PO TABS: 20.0000 mg | ORAL_TABLET | Freq: Two times a day (BID) | ORAL | Status: DC

## 2021-09-17 MED ORDER — OLANZAPINE 5 MG PO TBDP
20.0000 mg | ORAL_TABLET | ORAL | Status: DC
  Filled 2021-09-17: qty 4

## 2021-09-17 MED ORDER — OLANZAPINE 10 MG PO TBDP
20.0000 mg | ORAL_TABLET | Freq: Two times a day (BID) | ORAL | Status: DC
  Filled 2021-09-17: qty 2

## 2021-09-17 MED ORDER — LORAZEPAM 2 MG/ML IJ SOLN
2.0000 mg | Freq: Once | INTRAMUSCULAR | Status: AC
  Administered 2021-09-17: 2 mg via INTRAVENOUS
  Filled 2021-09-17: qty 1

## 2021-09-17 MED ORDER — FOLIC ACID 1 MG PO TABS: 1.0000 mg | ORAL_TABLET | Freq: Every day | ORAL | Status: DC

## 2021-09-17 MED ORDER — BENZTROPINE MESYLATE 0.5 MG PO TABS: 1.0000 mg | ORAL_TABLET | Freq: Two times a day (BID) | ORAL | Status: DC

## 2021-09-17 MED ORDER — SIMVASTATIN 20 MG PO TABS: 20.0000 mg | ORAL_TABLET | Freq: Every day | ORAL | Status: DC

## 2021-09-17 MED ORDER — HALOPERIDOL LACTATE 5 MG/ML IJ SOLN
5.0000 mg | Freq: Four times a day (QID) | INTRAMUSCULAR | Status: DC | PRN
  Administered 2021-09-18 (×2): 5 mg via INTRAVENOUS
  Filled 2021-09-17 (×2): qty 1

## 2021-09-17 MED ORDER — LABETALOL HCL 5 MG/ML IV SOLN: 5.0000 mg | INTRAVENOUS | Status: DC | PRN

## 2021-09-17 MED ORDER — ACETAMINOPHEN 325 MG PO TABS: 650.0000 mg | ORAL_TABLET | Freq: Four times a day (QID) | ORAL | Status: AC | PRN

## 2021-09-17 MED ORDER — SODIUM CHLORIDE 0.9 % IV SOLN
250.0000 mg | Freq: Two times a day (BID) | INTRAVENOUS | Status: DC
  Filled 2021-09-17: qty 2.5

## 2021-09-17 MED ORDER — ENOXAPARIN SODIUM 40 MG/0.4ML IJ SOSY
40.0000 mg | PREFILLED_SYRINGE | INTRAMUSCULAR | Status: DC
  Administered 2021-09-18 – 2021-10-21 (×31): 40 mg via SUBCUTANEOUS
  Filled 2021-09-17 (×33): qty 0.4

## 2021-09-17 MED ORDER — ASPIRIN EC 81 MG PO TBEC: 81.0000 mg | DELAYED_RELEASE_TABLET | Freq: Every day | ORAL | Status: DC

## 2021-09-17 MED ORDER — LORAZEPAM 2 MG/ML IJ SOLN
2.0000 mg | INTRAMUSCULAR | Status: DC | PRN
  Administered 2021-09-17 – 2021-09-18 (×2): 2 mg via INTRAVENOUS
  Filled 2021-09-17 (×2): qty 1

## 2021-09-17 MED ORDER — ONDANSETRON HCL 4 MG/2ML IJ SOLN
4.0000 mg | Freq: Four times a day (QID) | INTRAMUSCULAR | Status: DC | PRN
  Filled 2021-09-17: qty 2

## 2021-09-17 MED ORDER — DIVALPROEX SODIUM 125 MG PO CSDR
1000.0000 mg | DELAYED_RELEASE_CAPSULE | Freq: Two times a day (BID) | ORAL | Status: DC
Start: 2021-09-17 — End: 2021-09-18

## 2021-09-17 MED ORDER — LEVOTHYROXINE SODIUM 50 MCG PO TABS: 125.0000 ug | ORAL_TABLET | Freq: Every day | ORAL | Status: DC

## 2021-09-17 MED ORDER — ONDANSETRON HCL 4 MG PO TABS
4.0000 mg | ORAL_TABLET | Freq: Four times a day (QID) | ORAL | Status: DC | PRN
  Filled 2021-09-17: qty 1

## 2021-09-17 MED ORDER — HYDROXYZINE HCL 25 MG PO TABS: 50.0000 mg | ORAL_TABLET | Freq: Two times a day (BID) | ORAL | Status: DC | PRN

## 2021-09-17 MED ORDER — CLONAZEPAM 0.5 MG PO TABS
0.5000 mg | ORAL_TABLET | Freq: Two times a day (BID) | ORAL | Status: DC
Start: 2021-09-17 — End: 2021-09-18

## 2021-09-17 MED ORDER — TRAZODONE HCL 50 MG PO TABS: 150.0000 mg | ORAL_TABLET | Freq: Every day | ORAL | Status: DC

## 2021-09-17 MED ORDER — ACETAMINOPHEN 650 MG RE SUPP: 650.0000 mg | Freq: Four times a day (QID) | RECTAL | Status: AC | PRN

## 2021-09-17 MED ORDER — METFORMIN HCL 500 MG PO TABS: 1000.0000 mg | ORAL_TABLET | Freq: Two times a day (BID) | ORAL | Status: DC

## 2021-09-17 NOTE — ED Triage Notes (Signed)
[facility] with altered mental status   pt sleepy, right eye drainage noted.  Pt not responding to questions.  Siderails up x 2.  Ems report pt was found on floor this morning by staff at assisted living.   ?

## 2021-09-17 NOTE — Assessment & Plan Note (Signed)
-   Resumed home simvastatin 20 mg nightly ?

## 2021-09-17 NOTE — Assessment & Plan Note (Addendum)
-   As needed Haldol 5 mg IV every 6 hours as needed for agitation ordered, 3 doses ordered ?- Ativan 2 mg IV every 4 hours as needed for seizures and anxiety, 4 doses ordered ?

## 2021-09-17 NOTE — Assessment & Plan Note (Signed)
Continue recently started beta-blocker, have added Imdur. 

## 2021-09-17 NOTE — H&P (Signed)
?History and Physical  ? ?[name] WUJ:[id number] DOB: 1956/04/11 DOA: 09/17/2021 ? ?PCP: Koren Bound, NP  ?Patient coming from: [facility] ? ?I have personally briefly reviewed patient's old medical records in [facility] EMR. ? ?Chief Concern: Altered mental status ? ?HPI: [name] is a 66 year old male with history of schizophrenia, hypertension, hyperlipidemia, hypothyroid, history of noncompliance with medications, history of recurrent seizures, tardive dyskinesia, who presents emergency department for chief concerns of altered mental status. ? ?Initial vitals in the emergency department showed temperature of 98.6, respiration rate of 21, heart rate 105, blood pressure 167/106, SPO2 of 99% on room air. ? ?Serum sodium 141, potassium 4.0, chloride 102, bicarb 30, BUN of 26, serum creatinine of 1.03, nonfasting blood glucose 97, GFR greater than 60, WBC 4.2, hemoglobin 12.9, platelets of 164. ? ?ED treatment: Ativan 2 mg IV one-time dose, Zyprexa 20 mg, Haldol 5 mg IV one-time dose. ? ?At bedside patient opens eyes to light sternal chest rub, and loud verbal stimuli.  He does move his extremities spontaneously. ? ?He is not able to provide a history as he just received Haldol 5 mg IV, and Ativan 2 mg by IV for agitation ? ?Social history: He is from a group home/assisted living ? ?Vaccination history: Unknown ? ?ROS: Unable to complete due to altered mental status and patient with baseline dementia and schizophrenia ? ?ED Course: Discussed with emergency medicine provider, patient requiring hospitalization for chief concerns of altered mental status. ? ?Assessment/Plan ? ?Principal Problem: ?  Encephalopathy ?Active Problems: ?  Hypothyroid ?  Hyperlipidemia ?  HTN (hypertension) ?  Noncompliance ?  Seizure (HCC) ?  Agitation ?  Schizophrenia (HCC) ?  ? ?Cardiovascular and Mediastinum ?HTN (hypertension) ?Assessment & Plan ?- Labetalol 5 mg IV every 2 hours as needed for SBP greater than  175, 4 doses ordered ? ?Endocrine ?Hypothyroid ?Assessment & Plan ?- Resume home levothyroxine 125 mcg daily ? ?Nervous and Auditory ?* Encephalopathy ?Assessment & Plan ?- Presumed secondary to medication noncompliance and agitation ?- Refuses to take medication ?- Check ammonia, TSH, B12, UDS, UA ?- Aspiration, fall precautions ? ?Other ?Agitation ?Assessment & Plan ?- As needed Haldol 5 mg IV every 6 hours as needed for agitation ordered, 3 doses ordered ?- Ativan 2 mg IV every 4 hours as needed for seizures and anxiety, 4 doses ordered ? ?Seizure (HCC) ?Assessment & Plan ?- Resumed home Keppra to 50 mg p.o. changed to IV twice daily ?- Resumed home Depakote 1000 mg p.o. twice daily, clonazepam 0.5 mg p.o. twice daily ?- Ativan 2 mg every 4 hours as needed for seizure, anxiety, 4 doses ordered ?- Seizure precautions ? ?Hyperlipidemia ?Assessment & Plan ?- Resumed home simvastatin 20 mg nightly ? ?Chart reviewed.  ? ?DVT prophylaxis: Enoxaparin ?Code Status: Full code ?Diet: N.p.o., except for sips with meds and ice chips ?Family Communication: No ?Disposition Plan: Pending clinical course ?Consults called: None at this time ?Admission status: Telemetry medical, observation ? ?Past Medical History:  ?Diagnosis Date  ? Diabetes mellitus without complication (HCC)   ? HTN (hypertension)   ? Hyperammonemia (HCC) While on Depakote  ? Hyperlipemia   ? Lithium toxicity   ? Neutropenia (HCC)   ? Schizophrenia (HCC)   ? Seizure (HCC) While toxic on Depakote  ? Sickle cell trait (HCC)   ? Thyroid disease   ? TIA (transient ischemic attack) in 2009  ? ?History reviewed. No pertinent surgical history. ? ?Social History:  reports  that he has never smoked. He has never used smokeless tobacco. He reports that he does not currently use drugs. He reports that he does not drink alcohol. ? ?Allergies  ?Allergen Reactions  ? Depakote [Valproic Acid] Other (See Comments)  ?  Reaction:  Hyperammonemia   ? Lithium Other (See Comments)   ?  Pt has a prior history of Lithium toxicity.     ? ?History reviewed. No pertinent family history. ?Family history: Unable to review family history with patient as he is altered and has schizophrenia baseline. ? ?Prior to Admission medications   ?Medication Sig Start Date End Date Taking? Authorizing Provider  ?aspirin EC 81 MG tablet Take 81 mg by mouth daily.   Yes [provider]  ?benztropine (COGENTIN) 1 MG tablet Take 1 tablet (1 mg total) by mouth 2 (two) times daily with a meal. 12/10/18  Yes Mariel CraftMaurer, Sheila M, MD  ?clonazePAM (KLONOPIN) 0.5 MG tablet Take 1 tablet (0.5 mg total) by mouth 2 (two) times daily with a meal. ?Patient taking differently: Take 0.5 mg by mouth 2 (two) times daily. 10/04/15  Yes Jimmy FootmanHernandez-Gonzalez, Andrea, MD  ?divalproex (DEPAKOTE SPRINKLE) 125 MG capsule Take 1,000 mg by mouth 2 (two) times daily.   Yes [provider]  ?folic acid (FOLVITE) 1 MG tablet Take 1 mg by mouth daily.   Yes [provider]  ?haloperidol (HALDOL) 20 MG tablet Take 20 mg by mouth 2 (two) times daily. 09/10/21  Yes [provider]  ?hydrOXYzine (ATARAX) 50 MG tablet Take 50 mg by mouth 2 (two) times daily as needed for anxiety.   Yes [provider]  ?levETIRAcetam (KEPPRA) 250 MG tablet Take 250 mg by mouth 2 (two) times daily. 09/10/21  Yes [provider]  ?levothyroxine (SYNTHROID, LEVOTHROID) 125 MCG tablet Take 1 tablet (125 mcg total) by mouth daily before breakfast. 10/04/15  Yes Jimmy FootmanHernandez-Gonzalez, Andrea, MD  ?metFORMIN (GLUCOPHAGE) 1000 MG tablet Take 1 tablet (1,000 mg total) by mouth 2 (two) times daily with a meal. 10/04/15  Yes Jimmy FootmanHernandez-Gonzalez, Andrea, MD  ?OLANZapine zydis (ZYPREXA) 20 MG disintegrating tablet Take 1 tablet (20 mg total) by mouth 2 (two) times daily with a meal. ?Patient taking differently: Take 20 mg by mouth in the morning. 12/10/18  Yes Mariel CraftMaurer, Sheila M, MD  ?simvastatin (ZOCOR) 20 MG tablet Take 20 mg by mouth at bedtime.    Yes [provider]  ?traZODone (DESYREL) 150 MG tablet Take 150 mg by mouth at bedtime. 04/03/21  Yes [provider]  ?vitamin B-12 (CYANOCOBALAMIN) 1000 MCG tablet Take 1,000 mcg by mouth daily.   Yes [provider]  ? ?Physical Exam: ?Vitals:  ? 09/17/21 1830 09/17/21 2000 09/17/21 2130 09/17/21 2300  ?BP: (!) 167/106  (!) 169/99 (!) 148/96  ?Pulse: (!) 105 (!) 102 (!) 101 (!) 104  ?Resp: (!) 21 16 16 17   ?Temp:      ?TempSrc:      ?SpO2: 99% 98% 99% 100%  ?Weight:      ?Height:      ? ?Constitutional: appears older than chronological age, NAD, calm, comfortable ?Eyes: PERRL, lids and conjunctivae normal ?ENMT: Mucous membranes are moist. Posterior pharynx clear of any exudate or lesions. Age-appropriate dentition. Hearing appropriate ?Neck: normal, supple, no masses, no thyromegaly ?Respiratory: clear to auscultation bilaterally, no wheezing, no crackles. Normal respiratory effort. No accessory muscle use.  ?Cardiovascular: Regular rate and rhythm, no murmurs / rubs / gallops. No extremity edema. 2+ pedal pulses. No  carotid bruits.  ?Abdomen: no tenderness, no masses palpated, no hepatosplenomegaly. Bowel sounds positive.  ?Musculoskeletal: no clubbing / cyanosis. No joint deformity upper and lower extremities. Good ROM, no contractures, no atrophy. Normal muscle tone.  ?Skin: no rashes, lesions, ulcers. No induration ?Neurologic: Sensation intact. Strength 5/5 in all 4.  ?Psychiatric: Normal judgment and insight. Alert and oriented x 3. Normal mood.  ? ?EKG: independently reviewed, showing sinus tachycardia with rate of 102, QTc 454 ? ?Chest x-ray on Admission: I personally reviewed and I agree with radiologist reading as below. ? ?CT Head Wo Contrast ? ?Result Date: 09/17/2021 ?CLINICAL DATA:  Altered mental status EXAM: CT HEAD WITHOUT CONTRAST TECHNIQUE: Contiguous axial images were obtained from the base of the skull through the vertex without intravenous contrast. RADIATION DOSE  REDUCTION: This exam was performed according to the departmental dose-optimization program which includes automated exposure control, adjustment of the mA and/or kV according to patient size and/or u

## 2021-09-17 NOTE — ED Provider Notes (Signed)
? ?[facility] ?Provider Note ? ? ? Event Date/Time  ? First MD Initiated Contact with Patient 09/17/21 1823   ?  (approximate) ? ? ?History  ? ?Altered Mental Status ? ? ?HPI ? ?EM caveat: Somnolence, altered mental status ? ? ?[name] is a 66 y.o. male   history of schizophrenia hypertension dyskinesia seizure disorder ? ?Patient was brought by EMS, he was found to be somnolent today.  Not responding to questions as typical. ? ?Patient was not taking his medications.  Facility had noticed some drainage from around the right eye as well ? ? ?  ? ? ?Physical Exam  ? ?Triage Vital Signs: ?ED Triage Vitals  ?Enc Vitals Group  ?   BP 09/17/21 1814 (!) 156/85  ?   Pulse Rate 09/17/21 1810 (!) 101  ?   Resp 09/17/21 1810 20  ?   Temp 09/17/21 1810 98.6 ?F (37 ?C)  ?   Temp Source 09/17/21 1810 Oral  ?   SpO2 09/17/21 1810 99 %  ?   Weight 09/17/21 1811 214 lb (97.1 kg)  ?   Height 09/17/21 1811 6\' 3"  (1.905 m)  ?   Head Circumference --   ?   Peak Flow --   ?   Pain Score 09/17/21 1811 0  ?   Pain Loc --   ?   Pain Edu? --   ?   Excl. in [place]? --   ? ? ?Most recent vital signs: ?Vitals:  ? 09/17/21 2000 09/17/21 2130  ?BP:  (!) 169/99  ?Pulse: (!) 102 (!) 101  ?Resp: 16 16  ?Temp:    ?SpO2: 98% 99%  ? ? ? ?General: Awake, does purposeful things such as sitting up moving about.  Will occasionally say things like "I do not want to be at the hospital" but otherwise does not answer questions when questioned, fairly flat and affect ?CV:  Good peripheral perfusion.  Normal heart tones except for very slight tachycardia ?Resp:  Normal effort.  Clear lung sounds bilaterally ?Abd:  No distention.  Soft nontender all quadrants ?Other:   ?Moves all extremities well no obvious deficits.  Difficult to perform a detailed neurologic exam however as patient does not directly follow commands, but will occasionally say something when spoken to perhaps 1-2 words. ?Patient has mild conjunctival injection and slight  matting about the edge of the right eye.  There is no thick mucopurulent discharge.  There is no proptosis, there is no surrounding cellulitis of the lids. ? ?ED Results / Procedures / Treatments  ? ?Labs ?(all labs ordered are listed, but only abnormal results are displayed) ?Labs Reviewed  ?CBC - Abnormal; Notable for the following components:  ?    Result Value  ? Hemoglobin 12.9 (*)   ? All other components within normal limits  ?COMPREHENSIVE METABOLIC PANEL - Abnormal; Notable for the following components:  ? BUN 26 (*)   ? Calcium 8.7 (*)   ? All other components within normal limits  ?RESP PANEL BY RT-PCR (FLU A&B, COVID) ARPGX2  ?AMMONIA  ?TSH  ?BLOOD GAS, VENOUS  ?URINALYSIS, COMPLETE (UACMP) WITH MICROSCOPIC  ?HIV ANTIBODY (ROUTINE TESTING W REFLEX)  ?BASIC METABOLIC PANEL  ?CBC  ? ? ? ?EKG ? ?Reviewed inter by me at 1810 ?Heart rate 100 ?QRS 99 QTc 450 ?Possible arm electrode reversal.  LVH, likely sinus tachycardia.  Notable artifact.  No clear evidence of ischemia denoted ? ? ?RADIOLOGY ? ?I personally viewed  and interpreted the patient's CT scan of the head, grossly this is negative for acute finding. ? ?PROCEDURES: ? ?Critical Care performed: No ? ?Procedures ? ? ?MEDICATIONS ORDERED IN ED: ?Medications  ?aspirin EC tablet 81 mg (has no administration in time range)  ?simvastatin (ZOCOR) tablet 20 mg (has no administration in time range)  ?haloperidol (HALDOL) tablet 20 mg (has no administration in time range)  ?hydrOXYzine (ATARAX) tablet 50 mg (has no administration in time range)  ?OLANZapine zydis (ZYPREXA) disintegrating tablet 20 mg (has no administration in time range)  ?traZODone (DESYREL) tablet 150 mg (has no administration in time range)  ?levothyroxine (SYNTHROID) tablet 125 mcg (has no administration in time range)  ?metFORMIN (GLUCOPHAGE) tablet 1,000 mg (has no administration in time range)  ?folic acid (FOLVITE) tablet 1 mg (has no administration in time range)  ?vitamin B-12  (CYANOCOBALAMIN) tablet 1,000 mcg (has no administration in time range)  ?benztropine (COGENTIN) tablet 1 mg (has no administration in time range)  ?clonazePAM (KLONOPIN) tablet 0.5 mg (has no administration in time range)  ?divalproex (DEPAKOTE SPRINKLE) capsule 1,000 mg (has no administration in time range)  ?levETIRAcetam (KEPPRA) 250 mg in sodium chloride 0.9 % 100 mL IVPB (has no administration in time range)  ?acetaminophen (TYLENOL) tablet 650 mg (has no administration in time range)  ?  Or  ?acetaminophen (TYLENOL) suppository 650 mg (has no administration in time range)  ?ondansetron (ZOFRAN) tablet 4 mg (has no administration in time range)  ?  Or  ?ondansetron (ZOFRAN) injection 4 mg (has no administration in time range)  ?enoxaparin (LOVENOX) injection 40 mg (has no administration in time range)  ?haloperidol lactate (HALDOL) injection 5 mg (5 mg Intravenous Given 09/17/21 1928)  ?LORazepam (ATIVAN) injection 2 mg (2 mg Intravenous Given 09/17/21 1928)  ? ? ? ?IMPRESSION / MDM / ASSESSMENT AND PLAN / ED COURSE  ?I reviewed the triage vital signs and the nursing notes. ?             ?               ? ?Differential diagnosis includes, but is not limited to, acute toxic or metabolic etiologies, oversedation medication, infection, stroke central neurologic process, medication side effect, etc. ? ?No report of a no noted seizure-like activity.  No obvious focal deficits by exam though somewhat difficult due to patient participation.  Medication ? ?The patient is on the cardiac monitor to evaluate for evidence of arrhythmia and/or significant heart rate changes. ? ?Clinical Course as of 09/17/21 2159  ?Mon Sep 17, 2021  ?1920 Charge nurse, Lorriane Shire, reports patient trying to get up agitated and combative.  Was called emergently to evaluate.  Patient currently sitting up, has trying to climb up from the bed.  Attempted to give oral Zyprexa, patient refusing to utilize.  We will give IV Ativan and Haldol at this  time [MQ]  ?  ?Clinical Course User Index ?[MQ] Delman Kitten, MD  ? ?----------------------------------------- ?9:41 PM on 09/17/2021 ?----------------------------------------- ?Patient is resting calmly at this time.  Reassuring hemodynamics except for minimal tachycardia.  His work-up to this point largely unrevealing as to cause for encephalopathy.  Venous blood gas pending, TSH normal.  Ammonia normal. ? ?He is afebrile normal Essman count, negative influenza and COVID testing.  Chest x-ray clear.  CT of the head reviewed by me and is negative for acute finding. ? ?Consulted placed with Dr. Tobie Poet of internal medicine, upon evaluation of patient, will admit for further work-up. ? ?  Additionally, suspect conjunctivitis of the right eye, likely viral in nature though difficult to exclude bacterial etiology. ? ?FINAL CLINICAL IMPRESSION(S) / ED DIAGNOSES  ? ?Final diagnoses:  ?Delirium  ?Acute conjunctivitis of right eye, unspecified acute conjunctivitis type  ? ? ? ?Rx / DC Orders  ? ?ED Discharge Orders   ? ? None  ? ?  ? ? ? ?Note:  This document was prepared using Dragon voice recognition software and may include unintentional dictation errors. ?  Delman Kitten, MD ?09/17/21 2200 ? ?

## 2021-09-17 NOTE — Assessment & Plan Note (Signed)
-   Resume home levothyroxine 125 mcg daily ?

## 2021-09-17 NOTE — ED Notes (Signed)
Pt transported to CT ?

## 2021-09-17 NOTE — H&P (Incomplete)
History and Physical   [name] [id number] DOB: [birth year] DOA: 09/17/2021  PCP: Terrill Mohr, NP  Patient coming from: [facility]  I have personally briefly reviewed patient's old medical records in [place].  Chief Concern: Altered mental status  HPI: Mr. Kelle Truby is a 66 year old male with history of schizophrenia, hypertension, hyperlipidemia, hypothyroid, history of noncompliance with medications, history of recurrent seizures, tardive dyskinesia, who presents emergency department for chief concerns of altered mental status.  Initial vitals in the emergency department showed temperature of 98.6, respiration rate of 21, heart rate 105, blood pressure 167/106, SPO2 of 99% on room air.  Serum sodium 141, potassium 4.0, chloride 102, bicarb 30, BUN of 26, serum creatinine of 1.03, nonfasting blood glucose 97, GFR greater than 60, WBC 4.2, hemoglobin 12.9, platelets of 164.  ED treatment: Ativan 2 mg IV one-time dose, Zyprexa 20 mg, Haldol 5 mg IV one-time dose.  At bedside patient opens eyes to light sternal chest rub, and loud verbal stimuli.  He does move his extremities spontaneously.  He is not able to provide a history as he just received Haldol 5 mg IV, and Ativan 2 mg by IV for agitation  Social history: He is from a group home/assisted living  Vaccination history: Unknown  ROS: Unable to complete due to altered mental status and patient with baseline dementia and schizophrenia  ED Course: Discussed with emergency medicine provider, patient requiring hospitalization for chief concerns of altered mental status.  Assessment/Plan  Principal Problem:   Encephalopathy Active Problems:   Hypothyroid   Hyperlipidemia   HTN (hypertension)   Noncompliance   Agitation   Schizophrenia (HCC)    Nervous and Auditory * Encephalopathy Assessment & Plan - Presumed secondary to medication noncompliance and agitation - Refuses to take medication -  Check ammonia, TSH, B12, UDS, UA  Other Hyperlipidemia Assessment & Plan - Resumed home simvastatin 20 mg nightly   *** Chart reviewed.   DVT prophylaxis: ***  Code Status: ***  Diet: *** Family Communication: ***  Disposition Plan: ***  Consults called: ***  Admission status: ***   Past Medical History:  Diagnosis Date   Diabetes mellitus without complication ([place])    HTN (hypertension)    Hyperammonemia (HCC) While on Depakote   Hyperlipemia    Lithium toxicity    Neutropenia (HCC)    Schizophrenia (HCC)    Seizure (HCC) While toxic on Depakote   Sickle cell trait ([place])    Thyroid disease    TIA (transient ischemic attack) in 2009   History reviewed. No pertinent surgical history.  Social History:  reports that he has never smoked. He has never used smokeless tobacco. He reports that he does not currently use drugs. He reports that he does not drink alcohol.  Allergies  Allergen Reactions   Depakote [Valproic Acid] Other (See Comments)    Reaction:  Hyperammonemia    Lithium Other (See Comments)    Pt has a prior history of Lithium toxicity.      History reviewed. No pertinent family history. Family history: Unable to review family history with patient as he is altered and has schizophrenia baseline.  Prior to Admission medications   Medication Sig Start Date End Date Taking? Authorizing Provider  aspirin EC 81 MG tablet Take 81 mg by mouth daily.   Yes [provider]  benztropine (COGENTIN) 1 MG tablet Take 1 tablet (1 mg total) by mouth 2 (two) times daily with a meal.  12/10/18  Yes Lavella Hammock, MD  clonazePAM (KLONOPIN) 0.5 MG tablet Take 1 tablet (0.5 mg total) by mouth 2 (two) times daily with a meal. Patient taking differently: Take 0.5 mg by mouth 2 (two) times daily. 10/04/15  Yes Hildred Priest, MD  divalproex (DEPAKOTE SPRINKLE) 125 MG capsule Take 1,000 mg by mouth 2 (two) times daily.   Yes [provider]  folic acid (FOLVITE) 1 MG tablet Take 1 mg by mouth daily.   Yes [provider]  haloperidol (HALDOL) 20 MG tablet Take 20 mg by mouth 2 (two) times daily. 09/10/21  Yes [provider]  hydrOXYzine (ATARAX) 50 MG tablet Take 50 mg by mouth 2 (two) times daily as needed for anxiety.   Yes [provider]  levETIRAcetam (KEPPRA) 250 MG tablet Take 250 mg by mouth 2 (two) times daily. 09/10/21  Yes [provider]  levothyroxine (SYNTHROID, LEVOTHROID) 125 MCG tablet Take 1 tablet (125 mcg total) by mouth daily before breakfast. 10/04/15  Yes Hildred Priest, MD  metFORMIN (GLUCOPHAGE) 1000 MG tablet Take 1 tablet (1,000 mg total) by mouth 2 (two) times daily with a meal. 10/04/15  Yes Hildred Priest, MD  OLANZapine zydis (ZYPREXA) 20 MG disintegrating tablet Take 1 tablet (20 mg total) by mouth 2 (two) times daily with a meal. Patient taking differently: Take 20 mg by mouth in the morning. 12/10/18  Yes Lavella Hammock, MD  simvastatin (ZOCOR) 20 MG tablet Take 20 mg by mouth at bedtime.   Yes [provider]  traZODone (DESYREL) 150 MG tablet Take 150 mg by mouth at bedtime. 04/03/21  Yes [provider]  vitamin B-12 (CYANOCOBALAMIN) 1000 MCG tablet Take 1,000 mcg by mouth daily.   Yes [provider]   Physical Exam: Vitals:   09/17/21 1830 09/17/21 2000 09/17/21 2130 09/17/21 2300  BP: (!) 167/106  (!) 169/99 (!) 148/96  Pulse: (!) 105 (!) 102 (!) 101 (!) 104  Resp: (!) 21 16 16 17   Temp:      TempSrc:      SpO2: 99% 98% 99% 100%  Weight:      Height:       Constitutional: appears older than chronological age, NAD, calm, comfortable Eyes: PERRL, lids and conjunctivae normal ENMT: Mucous membranes are moist. Posterior pharynx clear of any exudate or lesions. Age-appropriate dentition. Hearing appropriate Neck: normal, supple, no masses, no thyromegaly Respiratory: clear to auscultation bilaterally, no  wheezing, no crackles. Normal respiratory effort. No accessory muscle use.  Cardiovascular: Regular rate and rhythm, no murmurs / rubs / gallops. No extremity edema. 2+ pedal pulses. No carotid bruits.  Abdomen: no tenderness, no masses palpated, no hepatosplenomegaly. Bowel sounds positive.  Musculoskeletal: no clubbing / cyanosis. No joint deformity upper and lower extremities. Good ROM, no contractures, no atrophy. Normal muscle tone.  Skin: no rashes, lesions, ulcers. No induration Neurologic: Sensation intact. Strength 5/5 in all 4.  Psychiatric: Normal judgment and insight. Alert and oriented x 3. Normal mood.   EKG: independently reviewed, showing sinus tachycardia with rate of 102, QTc 454  Chest x-ray on Admission: I personally reviewed and I agree with radiologist reading as below.  CT Head Wo Contrast  Result Date: 09/17/2021 CLINICAL DATA:  Altered mental status EXAM: CT HEAD WITHOUT CONTRAST TECHNIQUE: Contiguous axial images were obtained from the base of the skull through the vertex without intravenous contrast. RADIATION DOSE REDUCTION: This exam was performed according to the departmental dose-optimization program which includes  automated exposure control, adjustment of the mA and/or kV according to patient size and/or use of iterative reconstruction technique. COMPARISON:  CT dated August 10, 2021 FINDINGS: Brain: Chronic Mott matter ischemic change. No evidence of acute infarction, hemorrhage, hydrocephalus, extra-axial collection or mass lesion/mass effect. Vascular: No hyperdense vessel or unexpected calcification. Skull: Normal. Negative for fracture or focal lesion. Sinuses/Orbits: No acute finding. Other: None. IMPRESSION: No acute intracranial abnormality. Electronically Signed   By: Yetta Glassman M.D.   On: 09/17/2021 20:05   DG Chest Portable 1 View  Result Date: 09/17/2021 CLINICAL DATA:  Altered level of consciousness, found down EXAM: PORTABLE CHEST 1 VIEW  COMPARISON:  07/30/2020 FINDINGS: Frontal view of the chest demonstrates a stable cardiac silhouette. No acute airspace disease, effusion, or pneumothorax. No acute displaced fracture. IMPRESSION: 1. No acute intrathoracic process. Electronically Signed   By: Randa Ngo M.D.   On: 09/17/2021 20:43    Labs on Admission: I have personally reviewed following labs  CBC: Recent Labs  Lab 09/17/21 1813  WBC 4.2  HGB 12.9*  HCT 40.7  MCV 83.7  PLT 123456   Basic Metabolic Panel: Recent Labs  Lab 09/17/21 1813  NA 141  K 4.0  CL 102  CO2 30  GLUCOSE 97  BUN 26*  CREATININE 1.03  CALCIUM 8.7*   GFR: Estimated Creatinine Clearance: 84.3 mL/min (by C-G formula based on SCr of 1.03 mg/dL).  Liver Function Tests: Recent Labs  Lab 09/17/21 1813  AST 16  ALT 8  ALKPHOS 53  BILITOT 0.5  PROT 6.8  ALBUMIN 3.6   Recent Labs  Lab 09/17/21 1813  AMMONIA 14   Thyroid Function Tests: Recent Labs    09/17/21 1813  TSH 0.969   Urine analysis:    Component Value Date/Time   COLORURINE YELLOW (A) 08/10/2021 1753   APPEARANCEUR CLEAR (A) 08/10/2021 1753   APPEARANCEUR Clear 09/11/2012 0444   LABSPEC 1.020 08/10/2021 1753   LABSPEC 1.002 09/11/2012 0444   PHURINE 8.5 (H) 08/10/2021 1753   GLUCOSEU NEGATIVE 08/10/2021 1753   GLUCOSEU Negative 09/11/2012 0444   HGBUR NEGATIVE 08/10/2021 1753   BILIRUBINUR NEGATIVE 08/10/2021 1753   BILIRUBINUR Negative 09/11/2012 0444   KETONESUR TRACE (A) 08/10/2021 1753   PROTEINUR NEGATIVE 08/10/2021 1753   NITRITE NEGATIVE 08/10/2021 1753   LEUKOCYTESUR NEGATIVE 08/10/2021 1753   LEUKOCYTESUR Negative 09/11/2012 0444   Dr. Tobie Poet [facility]  If 7PM-7AM, please contact overnight-coverage provider If 7AM-7PM, please contact day coverage provider [email or web address]  09/17/2021, 11:36 PM

## 2021-09-17 NOTE — ED Notes (Signed)
[facility] brought [facility].  Ems reports pt not acting right today, sleepy, eye drainage, found on the floor this morning by staff.  Pt drowsy on arrival  md in with pt.  Siderails up x 2..   ?

## 2021-09-17 NOTE — Hospital Course (Addendum)
Mr. Demitrious Mccannon is a 66 year old male with history of schizophrenia, hypertension, hyperlipidemia, hypothyroid, history of noncompliance with medications, history of recurrent seizures, tardive dyskinesia, who presents emergency department for chief concerns of altered mental status. ? ?Initial vitals in the emergency department showed temperature of 98.6, respiration rate of 21, heart rate 105, blood pressure 167/106, SPO2 of 99% on room air. ? ?Serum sodium 141, potassium 4.0, chloride 102, bicarb 30, BUN of 26, serum creatinine of 1.03, nonfasting blood glucose 97, GFR greater than 60, WBC 4.2, hemoglobin 12.9, platelets of 164. ? ?ED treatment: Ativan 2 mg IV one-time dose, Zyprexa 20 mg, Haldol 5 mg IV one-time dose. ?

## 2021-09-17 NOTE — Assessment & Plan Note (Addendum)
-   Presumed secondary to medication noncompliance and agitation - Refuses to take medication - Check ammonia, TSH, B12, UDS, UA

## 2021-09-18 DIAGNOSIS — D72819 Decreased white blood cell count, unspecified: Secondary | ICD-10-CM | POA: Diagnosis not present

## 2021-09-18 DIAGNOSIS — F202 Catatonic schizophrenia: Secondary | ICD-10-CM | POA: Diagnosis present

## 2021-09-18 DIAGNOSIS — E785 Hyperlipidemia, unspecified: Secondary | ICD-10-CM | POA: Diagnosis not present

## 2021-09-18 DIAGNOSIS — R410 Disorientation, unspecified: Secondary | ICD-10-CM | POA: Diagnosis present

## 2021-09-18 DIAGNOSIS — I959 Hypotension, unspecified: Secondary | ICD-10-CM | POA: Diagnosis present

## 2021-09-18 DIAGNOSIS — H1031 Unspecified acute conjunctivitis, right eye: Secondary | ICD-10-CM | POA: Diagnosis present

## 2021-09-18 DIAGNOSIS — E870 Hyperosmolality and hypernatremia: Secondary | ICD-10-CM | POA: Diagnosis not present

## 2021-09-18 DIAGNOSIS — R4182 Altered mental status, unspecified: Secondary | ICD-10-CM | POA: Diagnosis not present

## 2021-09-18 DIAGNOSIS — E875 Hyperkalemia: Secondary | ICD-10-CM | POA: Diagnosis not present

## 2021-09-18 DIAGNOSIS — F203 Undifferentiated schizophrenia: Secondary | ICD-10-CM | POA: Diagnosis not present

## 2021-09-18 DIAGNOSIS — E43 Unspecified severe protein-calorie malnutrition: Secondary | ICD-10-CM | POA: Diagnosis present

## 2021-09-18 DIAGNOSIS — L249 Irritant contact dermatitis, unspecified cause: Secondary | ICD-10-CM | POA: Diagnosis present

## 2021-09-18 DIAGNOSIS — E039 Hypothyroidism, unspecified: Secondary | ICD-10-CM | POA: Diagnosis present

## 2021-09-18 DIAGNOSIS — Z79890 Hormone replacement therapy (postmenopausal): Secondary | ICD-10-CM | POA: Diagnosis not present

## 2021-09-18 DIAGNOSIS — T43596A Underdosing of other antipsychotics and neuroleptics, initial encounter: Secondary | ICD-10-CM | POA: Diagnosis present

## 2021-09-18 DIAGNOSIS — R339 Retention of urine, unspecified: Secondary | ICD-10-CM | POA: Diagnosis present

## 2021-09-18 DIAGNOSIS — Y92099 Unspecified place in other non-institutional residence as the place of occurrence of the external cause: Secondary | ICD-10-CM | POA: Diagnosis not present

## 2021-09-18 DIAGNOSIS — Z781 Physical restraint status: Secondary | ICD-10-CM | POA: Diagnosis not present

## 2021-09-18 DIAGNOSIS — J690 Pneumonitis due to inhalation of food and vomit: Secondary | ICD-10-CM | POA: Diagnosis not present

## 2021-09-18 DIAGNOSIS — Z79899 Other long term (current) drug therapy: Secondary | ICD-10-CM | POA: Diagnosis not present

## 2021-09-18 DIAGNOSIS — J9601 Acute respiratory failure with hypoxia: Secondary | ICD-10-CM | POA: Diagnosis not present

## 2021-09-18 DIAGNOSIS — Z8673 Personal history of transient ischemic attack (TIA), and cerebral infarction without residual deficits: Secondary | ICD-10-CM | POA: Diagnosis not present

## 2021-09-18 DIAGNOSIS — Z7189 Other specified counseling: Secondary | ICD-10-CM | POA: Diagnosis not present

## 2021-09-18 DIAGNOSIS — D573 Sickle-cell trait: Secondary | ICD-10-CM | POA: Diagnosis present

## 2021-09-18 DIAGNOSIS — Z91148 Patient's other noncompliance with medication regimen for other reason: Secondary | ICD-10-CM | POA: Diagnosis not present

## 2021-09-18 DIAGNOSIS — R7303 Prediabetes: Secondary | ICD-10-CM | POA: Diagnosis present

## 2021-09-18 DIAGNOSIS — G9340 Encephalopathy, unspecified: Secondary | ICD-10-CM | POA: Diagnosis not present

## 2021-09-18 DIAGNOSIS — I10 Essential (primary) hypertension: Secondary | ICD-10-CM | POA: Diagnosis present

## 2021-09-18 DIAGNOSIS — G40901 Epilepsy, unspecified, not intractable, with status epilepticus: Secondary | ICD-10-CM | POA: Diagnosis present

## 2021-09-18 DIAGNOSIS — Z20822 Contact with and (suspected) exposure to covid-19: Secondary | ICD-10-CM | POA: Diagnosis present

## 2021-09-18 DIAGNOSIS — R338 Other retention of urine: Secondary | ICD-10-CM | POA: Diagnosis not present

## 2021-09-18 DIAGNOSIS — T426X6A Underdosing of other antiepileptic and sedative-hypnotic drugs, initial encounter: Secondary | ICD-10-CM | POA: Diagnosis present

## 2021-09-18 DIAGNOSIS — R569 Unspecified convulsions: Secondary | ICD-10-CM | POA: Diagnosis not present

## 2021-09-18 LAB — BASIC METABOLIC PANEL
Anion gap: 7 (ref 5–15)
BUN: 27 mg/dL — ABNORMAL HIGH (ref 8–23)
CO2: 30 mmol/L (ref 22–32)
Calcium: 8.7 mg/dL — ABNORMAL LOW (ref 8.9–10.3)
Chloride: 106 mmol/L (ref 98–111)
Creatinine, Ser: 0.99 mg/dL (ref 0.61–1.24)
GFR, Estimated: >60 mL/min (ref >60)
Glucose, Bld: 85 mg/dL (ref 70–99)
Potassium: 4.2 mmol/L (ref 3.5–5.1)
Sodium: 143 mmol/L (ref 135–145)

## 2021-09-18 LAB — TSH: TSH: 1.345 u[IU]/mL (ref 0.350–4.500)

## 2021-09-18 LAB — CBC
HCT: 41.2 % (ref 39.0–52.0)
Hemoglobin: 13.2 g/dL (ref 13.0–17.0)
MCH: 26.7 pg (ref 26.0–34.0)
MCHC: 32 g/dL (ref 30.0–36.0)
MCV: 83.2 fL (ref 80.0–100.0)
Platelets: 155 10*3/uL (ref 150–400)
RBC: 4.95 MIL/uL (ref 4.22–5.81)
RDW: 14.5 % (ref 11.5–15.5)
WBC: 3.9 10*3/uL — ABNORMAL LOW (ref 4.0–10.5)
nRBC: 0 % (ref 0.0–0.2)

## 2021-09-18 LAB — MRSA NEXT GEN BY PCR, NASAL: MRSA by PCR Next Gen: NOT DETECTED

## 2021-09-18 LAB — HIV ANTIBODY (ROUTINE TESTING W REFLEX): HIV Screen 4th Generation wRfx: NONREACTIVE

## 2021-09-18 LAB — GLUCOSE, CAPILLARY
Glucose-Capillary: 85 mg/dL (ref 70–99)
Glucose-Capillary: 97 mg/dL (ref 70–99)

## 2021-09-18 MED ORDER — HYDROCERIN EX CREA
TOPICAL_CREAM | Freq: Two times a day (BID) | CUTANEOUS | Status: AC
  Administered 2021-09-21 – 2021-09-29 (×4): 1 via TOPICAL
  Filled 2021-09-18 (×2): qty 113

## 2021-09-18 MED ORDER — LEVETIRACETAM 250 MG PO TABS
250.0000 mg | ORAL_TABLET | Freq: Two times a day (BID) | ORAL | Status: DC
  Filled 2021-09-18 (×2): qty 1

## 2021-09-18 MED ORDER — LABETALOL HCL 5 MG/ML IV SOLN
5.0000 mg | INTRAVENOUS | Status: DC | PRN
  Administered 2021-09-18 – 2021-09-25 (×8): 5 mg via INTRAVENOUS
  Filled 2021-09-18 (×8): qty 4

## 2021-09-18 MED ORDER — VITAMIN B-12 1000 MCG PO TABS
1000.0000 ug | ORAL_TABLET | Freq: Every day | ORAL | Status: DC
  Filled 2021-09-18: qty 1

## 2021-09-18 MED ORDER — OLANZAPINE 10 MG IM SOLR
15.0000 mg | Freq: Two times a day (BID) | INTRAMUSCULAR | Status: DC
  Administered 2021-09-18 – 2021-09-24 (×12): 15 mg via INTRAMUSCULAR
  Filled 2021-09-18 (×13): qty 20

## 2021-09-18 MED ORDER — VALPROATE SODIUM 100 MG/ML IV SOLN
500.0000 mg | Freq: Four times a day (QID) | INTRAVENOUS | Status: DC
  Administered 2021-09-18 – 2021-09-21 (×11): 500 mg via INTRAVENOUS
  Filled 2021-09-18 (×14): qty 5

## 2021-09-18 MED ORDER — LORAZEPAM 2 MG/ML IJ SOLN
2.0000 mg | INTRAMUSCULAR | Status: DC | PRN
  Administered 2021-09-19 – 2021-09-22 (×6): 2 mg via INTRAVENOUS
  Filled 2021-09-18 (×6): qty 1

## 2021-09-18 MED ORDER — MUPIROCIN 2 % EX OINT: 1.0000 "application " | TOPICAL_OINTMENT | Freq: Two times a day (BID) | CUTANEOUS | Status: DC

## 2021-09-18 MED ORDER — DIVALPROEX SODIUM 125 MG PO CSDR
1000.0000 mg | DELAYED_RELEASE_CAPSULE | Freq: Two times a day (BID) | ORAL | Status: DC
  Filled 2021-09-18 (×2): qty 8

## 2021-09-18 MED ORDER — HALOPERIDOL DECANOATE 100 MG/ML IM SOLN
200.0000 mg | INTRAMUSCULAR | Status: DC
  Administered 2021-09-18 – 2021-10-18 (×2): 200 mg via INTRAMUSCULAR
  Filled 2021-09-18 (×2): qty 2

## 2021-09-18 MED ORDER — ERYTHROMYCIN 5 MG/GM OP OINT
TOPICAL_OINTMENT | Freq: Three times a day (TID) | OPHTHALMIC | Status: AC
  Administered 2021-09-18 – 2021-09-20 (×7): 1 via OPHTHALMIC
  Filled 2021-09-18 (×3): qty 1

## 2021-09-18 MED ORDER — SODIUM CHLORIDE 0.9 % IV SOLN
250.0000 mg | Freq: Two times a day (BID) | INTRAVENOUS | Status: DC
  Administered 2021-09-18 – 2021-09-24 (×12): 250 mg via INTRAVENOUS
  Filled 2021-09-18 (×13): qty 2.5

## 2021-09-18 MED ORDER — HALOPERIDOL LACTATE 5 MG/ML IJ SOLN
5.0000 mg | Freq: Four times a day (QID) | INTRAMUSCULAR | Status: AC | PRN
  Administered 2021-09-19: 5 mg via INTRAVENOUS
  Filled 2021-09-18: qty 1

## 2021-09-18 MED ORDER — INSULIN ASPART 100 UNIT/ML IJ SOLN
0.0000 [IU] | INTRAMUSCULAR | Status: DC
  Administered 2021-09-23 – 2021-10-01 (×3): 1 [IU] via SUBCUTANEOUS
  Filled 2021-09-18 (×5): qty 1

## 2021-09-18 NOTE — ED Notes (Signed)
Pt swinging legs over bedrails, attempting to get out of bed. Pt redirected back into bed ? ?

## 2021-09-18 NOTE — ED Notes (Signed)
Pt attempting to get out of bed, pull off equipment. Pt redirected, legs placed back into bed, pulse ox reapplied ?
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
## 2021-09-18 NOTE — Assessment & Plan Note (Addendum)
Secondary to psychosis.  Medical causes ruled out including CO2, ammonia, thyroid, infection and electrolyte abnormalities.  CT of head unremarkable.  Psychiatry consulted.  After obtaining permission from guardian, DSS, patient started on ECT and has actually been responding.  ?No new seizure activity.  Medications adjusted.  See below.  ECT appears to be complete. ?

## 2021-09-18 NOTE — ED Notes (Signed)
Pts linen and clothing soiled with urine. Pts clothing removed, pt cleaned and dried with new linen, brief and hospital gown in place. Megan, RN removed bilateral "layers" of socks with use of water to moisten due to socks being hardened and soiled with layers of skin, urine and feces. Unknown how long layers of socks have been in place. Pt presented with poor hygiene and multiple layers of clothing.  ?

## 2021-09-18 NOTE — Consult Note (Signed)
[facility] Face-to-Face Psychiatry Consult   Reason for Consult: Consult for this 66 year old man with a long history of schizophrenia who was brought into the hospital with disorganized almost unresponsive behavior very poor self-care and some agitation Referring Physician:  [facility] Patient Identification: [name] MRN:  [id number] Principal Diagnosis: Schizophrenia (HCC) Diagnosis:  Principal Problem:   Schizophrenia (HCC) Active Problems:   Diabetes (HCC)   Hypothyroid   Hyperlipidemia   HTN (hypertension)   Noncompliance   Seizure (HCC)   Acute encephalopathy   Acute conjunctivitis, right eye   Total Time spent with patient: 45 minutes  Subjective:   [name] is a 66 y.o. male patient admitted with patient is not currently responding verbally.Marland Kitchen  HPI: Patient seen and chart reviewed.  Patient known from previous encounters.  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  Brought to the hospital from his living facility with reports of altered mental status.  Some report in the chart also that he had been noncompliant with medicine.  Patient was not able to give any history and appeared what to be intermittently either agitated or unresponsive.  Very filthy.  When I came to see him this afternoon patient was asleep in bed.  He had received sedating medication at least once because of safety risk of trying to climb out of the bed.  Lab work-up so far has not revealed any new specific cause of "encephalopathy".  Past Psychiatric History: Patient is well known to the psychiatric service and has a long history of schizophrenia.  When he is off his medicine for even a short period of time he tends to decompensate badly and can become very disorganized.  Usually hyper religious during this period as well.  He will often stop eating for long periods of time and usually requires forced medication to become stable again.  Reviewed chart about most recent  medications which tended to include both haloperidol decanoate and olanzapine  Risk to Self:   Risk to Others:   Prior Inpatient Therapy:   Prior Outpatient Therapy:    Past Medical History:  Past Medical History:  Diagnosis Date   Diabetes mellitus without complication (HCC)    HTN (hypertension)    Hyperammonemia (HCC) While on Depakote   Hyperlipemia    Lithium toxicity    Neutropenia (HCC)    Schizophrenia (HCC)    Seizure (HCC) While toxic on Depakote   Sickle cell trait (HCC)    Thyroid disease    TIA (transient ischemic attack) in 2009   History reviewed. No pertinent surgical history. Family History: History reviewed. No pertinent family history. Family Psychiatric  History: See previous Social History:  Social History   Substance and Sexual Activity  Alcohol Use No     Social History   Substance and Sexual Activity  Drug Use Not Currently    Social History   Socioeconomic History   Marital status: Single    Spouse name: Not on file   Number of children: Not on file   Years of education: Not on file   Highest education level: Not on file  Occupational History   Not on file  Tobacco Use   Smoking status: Never   Smokeless tobacco: Never  Vaping Use   Vaping Use: Never used  Substance and Sexual Activity   Alcohol use: No   Drug use: Not Currently   Sexual activity: Not Currently  Other Topics Concern   Not on file  Social History Narrative  Not on file   Social Determinants of Health   Financial Resource Strain: Not on file  Food Insecurity: Not on file  Transportation Needs: Not on file  Physical Activity: Not on file  Stress: Not on file  Social Connections: Not on file   Additional Social History:    Allergies:   Allergies  Allergen Reactions   Depakote [Valproic Acid] Other (See Comments)    Reaction:  Hyperammonemia    Lithium Other (See Comments)    Pt has a prior history of Lithium toxicity.       Labs:  Results for  orders placed or performed during the hospital encounter of 09/17/21 (from the past 48 hour(s))  CBC     Status: Abnormal   Collection Time: 09/17/21  6:13 PM  Result Value Ref Range   WBC 4.2 4.0 - 10.5 K/uL   RBC 4.86 4.22 - 5.81 MIL/uL   Hemoglobin 12.9 (L) 13.0 - 17.0 g/dL   HCT 16.1 09.6 - 04.5 %   MCV 83.7 80.0 - 100.0 fL   MCH 26.5 26.0 - 34.0 pg   MCHC 31.7 30.0 - 36.0 g/dL   RDW 40.9 81.1 - 91.4 %   Platelets 164 150 - 400 K/uL   nRBC 0.0 0.0 - 0.2 %    Comment: Performed at [facility], [address] [place], [place] [address]  Comprehensive metabolic panel     Status: Abnormal   Collection Time: 09/17/21  6:13 PM  Result Value Ref Range   Sodium 141 135 - 145 mmol/L   Potassium 4.0 3.5 - 5.1 mmol/L   Chloride 102 98 - 111 mmol/L   CO2 30 22 - 32 mmol/L   Glucose, Bld 97 70 - 99 mg/dL    Comment: Glucose reference range applies only to samples taken after fasting for at least 8 hours.   BUN 26 (H) 8 - 23 mg/dL   Creatinine, Ser 6.21 0.61 - 1.24 mg/dL   Calcium 8.7 (L) 8.9 - 10.3 mg/dL   Total Protein 6.8 6.5 - 8.1 g/dL   Albumin 3.6 3.5 - 5.0 g/dL   AST 16 15 - 41 U/L   ALT 8 0 - 44 U/L   Alkaline Phosphatase 53 38 - 126 U/L   Total Bilirubin 0.5 0.3 - 1.2 mg/dL   GFR, Estimated >30 >86 mL/min    Comment: (NOTE) Calculated using the CKD-EPI Creatinine Equation (2021)    Anion gap 9 5 - 15    Comment: Performed at [facility], [address] [place], [place] [address]  Ammonia     Status: None   Collection Time: 09/17/21  6:13 PM  Result Value Ref Range   Ammonia 14 9 - 35 umol/L    Comment: Performed at [facility], [address] [place], [place] [address]  TSH     Status: None   Collection Time: 09/17/21  6:13 PM  Result Value Ref Range   TSH 0.969 0.350 - 4.500 uIU/mL    Comment: Performed by a 3rd Generation assay with a functional sensitivity of <=0.01 uIU/mL. Performed at [facility], [address] [place], [place] [address]   Resp Panel by RT-PCR (Flu A&B, Covid) Nasopharyngeal Swab     Status: None   Collection Time: 09/17/21  7:45 PM   Specimen: Nasopharyngeal Swab; Nasopharyngeal(NP) swabs in vial transport medium  Result Value Ref Range   SARS Coronavirus 2 by RT PCR NEGATIVE NEGATIVE  Comment: (NOTE) SARS-CoV-2 target nucleic acids are NOT DETECTED.  The SARS-CoV-2 RNA is generally detectable in upper respiratory specimens during the acute phase of infection. The lowest concentration of SARS-CoV-2 viral copies this assay can detect is 138 copies/mL. A negative result does not preclude SARS-Cov-2 infection and should not be used as the sole basis for treatment or other patient management decisions. A negative result may occur with  improper specimen collection/handling, submission of specimen other than nasopharyngeal swab, presence of viral mutation(s) within the areas targeted by this assay, and inadequate number of viral copies(<138 copies/mL). A negative result must be combined with clinical observations, patient history, and epidemiological information. The expected result is Negative.  Fact Sheet for Patients:  BloggerCourse.com  Fact Sheet for Healthcare Providers:  SeriousBroker.it  This test is no t yet approved or cleared by the [place] FDA and  has been authorized for detection and/or diagnosis of SARS-CoV-2 by FDA under an Emergency Use Authorization (EUA). This EUA will remain  in effect (meaning this test can be used) for the duration of the COVID-19 declaration under Section 564(b)(1) of the Act, 21 U.S.C.section 360bbb-3(b)(1), unless the authorization is terminated  or revoked sooner.       Influenza A by PCR NEGATIVE NEGATIVE   Influenza B by PCR NEGATIVE NEGATIVE    Comment: (NOTE) The Xpert Xpress SARS-CoV-2/FLU/RSV plus assay is intended as an aid in the diagnosis of influenza from  Nasopharyngeal swab specimens and should not be used as a sole basis for treatment. Nasal washings and aspirates are unacceptable for Xpert Xpress SARS-CoV-2/FLU/RSV testing.  Fact Sheet for Patients: BloggerCourse.com  Fact Sheet for Healthcare Providers: SeriousBroker.it  This test is not yet approved or cleared by the [place] FDA and has been authorized for detection and/or diagnosis of SARS-CoV-2 by FDA under an Emergency Use Authorization (EUA). This EUA will remain in effect (meaning this test can be used) for the duration of the COVID-19 declaration under Section 564(b)(1) of the Act, 21 U.S.C. section 360bbb-3(b)(1), unless the authorization is terminated or revoked.  Performed at [facility], [address] [place], [place] [address]   Blood gas, venous     Status: Abnormal (Preliminary result)   Collection Time: 09/17/21  9:15 PM  Result Value Ref Range   pH, Ven 7.41 7.25 - 7.43   pCO2, Ven 51 44 - 60 mmHg   pO2, Ven 55 (H) 32 - 45 mmHg   Bicarbonate 32.3 (H) 20.0 - 28.0 mmol/L   Acid-Base Excess 6.3 (H) 0.0 - 2.0 mmol/L   O2 Saturation 85.7 %   Patient temperature 37.0    Collection site VENOUS     Comment: Performed at [facility], [address] [place], [place] [address]   Drawn by PENDING   Basic metabolic panel     Status: Abnormal   Collection Time: 09/18/21  9:32 AM  Result Value Ref Range   Sodium 143 135 - 145 mmol/L   Potassium 4.2 3.5 - 5.1 mmol/L   Chloride 106 98 - 111 mmol/L   CO2 30 22 - 32 mmol/L   Glucose, Bld 85 70 - 99 mg/dL    Comment: Glucose reference range applies only to samples taken after fasting for at least 8 hours.   BUN 27 (H) 8 - 23 mg/dL   Creatinine, Ser 0.98 0.61 - 1.24 mg/dL   Calcium 8.7 (L) 8.9 - 10.3 mg/dL   GFR, Estimated >11 >91 mL/min    Comment: (  NOTE) Calculated using the CKD-EPI Creatinine Equation (2021)    Anion gap 7 5 - 15     Comment: Performed at [facility], [address] [place], [place] [address]  CBC     Status: Abnormal   Collection Time: 09/18/21  9:32 AM  Result Value Ref Range   WBC 3.9 (L) 4.0 - 10.5 K/uL   RBC 4.95 4.22 - 5.81 MIL/uL   Hemoglobin 13.2 13.0 - 17.0 g/dL   HCT 40.3 47.4 - 25.9 %   MCV 83.2 80.0 - 100.0 fL   MCH 26.7 26.0 - 34.0 pg   MCHC 32.0 30.0 - 36.0 g/dL   RDW 56.3 87.5 - 64.3 %   Platelets 155 150 - 400 K/uL   nRBC 0.0 0.0 - 0.2 %    Comment: Performed at [facility], [address], [place], [place] [address]  MRSA Next Gen by PCR, Nasal     Status: None   Collection Time: 09/18/21 10:21 AM   Specimen: Nasal Mucosa; Nasal Swab  Result Value Ref Range   MRSA by PCR Next Gen NOT DETECTED NOT DETECTED    Comment: (NOTE) The GeneXpert MRSA Assay (FDA approved for NASAL specimens only), is one component of a comprehensive MRSA colonization surveillance program. It is not intended to diagnose MRSA infection nor to guide or monitor treatment for MRSA infections. Test performance is not FDA approved in patients less than 67 years old. Performed at [facility], [address] [place], [place] [address]   Glucose, capillary     Status: None   Collection Time: 09/18/21  5:20 PM  Result Value Ref Range   Glucose-Capillary 85 70 - 99 mg/dL    Comment: Glucose reference range applies only to samples taken after fasting for at least 8 hours.    Current Facility-Administered Medications  Medication Dose Route Frequency Provider Last Rate Last Admin   acetaminophen (TYLENOL) tablet 650 mg  650 mg Oral Q6H PRN Cox, Amy N, DO       Or   acetaminophen (TYLENOL) suppository 650 mg  650 mg Rectal Q6H PRN Cox, Amy N, DO       enoxaparin (LOVENOX) injection 40 mg  40 mg Subcutaneous Q24H Cox, Amy N, DO       erythromycin ophthalmic ointment   Right Eye Q8H Elease Etienne, MD   Given at 09/18/21 1046   haloperidol decanoate (HALDOL DECANOATE) 100  MG/ML injection 200 mg  200 mg Intramuscular Q30 days Shelley Cocke, Jackquline Denmark, MD       haloperidol lactate (HALDOL) injection 5 mg  5 mg Intravenous Q6H PRN Hongalgi, Maximino [place], MD       hydrocerin (EUCERIN) cream   Topical BID Elease Etienne, MD   Given at 09/18/21 1450   insulin aspart (novoLOG) injection 0-6 Units  0-6 Units Subcutaneous Q4H Hongalgi, Maximino [place], MD       labetalol (NORMODYNE) injection 5 mg  5 mg Intravenous Q2H PRN Hongalgi, Maximino [place], MD       levETIRAcetam (KEPPRA) 250 mg in sodium chloride 0.9 % 100 mL IVPB  250 mg Intravenous Q12H Elease Etienne, MD 410 mL/hr at 09/18/21 1715 250 mg at 09/18/21 1715   LORazepam (ATIVAN) injection 2 mg  2 mg Intravenous Q4H PRN Hongalgi, Maximino [place], MD       OLANZapine (ZYPREXA) injection 15 mg  15 mg Intramuscular BID Tracie Lindbloom, Jackquline [place], MD       ondansetron [facility])  tablet 4 mg  4 mg Oral Q6H PRN Cox, Amy N, DO       Or   ondansetron (ZOFRAN) injection 4 mg  4 mg Intravenous Q6H PRN Cox, Amy N, DO       valproate (DEPACON) 500 mg in dextrose 5 % 50 mL IVPB  500 mg Intravenous Q6H Hongalgi, Maximino [place], MD 55 mL/hr at 09/18/21 1752 500 mg at 09/18/21 1752    Musculoskeletal: Strength & Muscle Tone: within normal limits Gait & Station: unable to stand Patient leans: N/A            Psychiatric Specialty Exam:  Presentation  General Appearance: Bizarre; Disheveled  Eye Contact:Fair  Speech:Slow; Blocked  Speech Volume:Decreased  Handedness:Right   Mood and Affect  Mood:Irritable  Affect:Constricted   Thought Process  Thought Processes:Disorganized  Descriptions of Associations:Loose  Orientation:Partial  Thought Content:Scattered  History of Schizophrenia/Schizoaffective disorder:Yes  Duration of Psychotic Symptoms:Greater than six months  Hallucinations:No data recorded Ideas of Reference:Paranoia  Suicidal Thoughts:No data recorded Homicidal Thoughts:No data recorded  Sensorium  Memory:Recent Poor; Remote  Poor  Judgment:Impaired  Insight:Poor   Executive Functions  Concentration:Poor  Attention Span:Poor  Recall:Poor  Fund of Knowledge:Poor  Language:Poor   Psychomotor Activity  Psychomotor Activity:No data recorded  Assets  Assets:Social Support; Housing   Sleep  Sleep:No data recorded  Physical Exam: Physical Exam Vitals and nursing note reviewed.  Constitutional:      Appearance: Normal appearance.  HENT:     Head: Normocephalic and atraumatic.     Mouth/Throat:     Pharynx: Oropharynx is clear.  Eyes:     Pupils: Pupils are equal, round, and reactive to light.  Cardiovascular:     Rate and Rhythm: Normal rate and regular rhythm.  Pulmonary:     Effort: Pulmonary effort is normal.     Breath sounds: Normal breath sounds.  Abdominal:     General: Abdomen is flat.     Palpations: Abdomen is soft.  Musculoskeletal:        General: Normal range of motion.  Skin:    General: Skin is warm and dry.  Neurological:     General: No focal deficit present.     Mental Status: Mental status is at baseline.  Psychiatric:        Attention and Perception: He is inattentive.        Mood and Affect: Affect is blunt.        Speech: He is noncommunicative.   Review of Systems  Unable to perform ROS: Psychiatric disorder  Blood pressure (!) 161/98, pulse (!) 107, temperature 98.5 F (36.9 C), temperature source Oral, resp. rate 18, height 6\' 3"  (1.905 m), weight 97.1 kg, SpO2 94 %. Body mass index is 26.75 kg/m.  Treatment Plan Summary: Medication management and Plan patient is currently unresponsive because of medication but evidently has been agitated as well and usually in this condition will even when fully awake refuse medication.  I have placed an order for him to get his haloperidol decanoate shot 200 mg which is the usual dosage he has been on and also to restart olanzapine 15 mg IM twice a day.  Patient will likely require IM medication for a while.  Probably  is going to continue to need a sitter as he is likely to be agitated even when he does start to wake up.  We will follow up as needed.  Disposition:  See above  Mordecai Rasmussen, MD 09/18/2021 6:20 PM

## 2021-09-18 NOTE — Assessment & Plan Note (Addendum)
Improved with erythromycin eye ointment. ?

## 2021-09-18 NOTE — Progress Notes (Signed)
MEDICATION RELATED CONSULT NOTE - FOLLOW UP ? ? ?Pharmacy Consult for Depakote to Valproic Acid IV conversion ?Indication: seizures ? ?Allergies  ?Allergen Reactions  ? Depakote [Valproic Acid] Other (See Comments)  ?  Reaction:  Hyperammonemia   ? Lithium Other (See Comments)  ?  Pt has a prior history of Lithium toxicity.     ? ? ?Patient Measurements: ?Height: 6\' 3"  (190.5 cm) ?Weight: 97.1 kg (214 lb) ?IBW/kg (Calculated) : 84.5 ? ?Vital Signs: ?Temp: 98.5 ?F (36.9 ?C) (03/14 1504) ?Temp Source: Oral (03/14 1504) ?BP: 161/98 (03/14 1504) ?Pulse Rate: 107 (03/14 1504) ? ?Labs: ?Recent Labs  ?  09/17/21 ?1813 09/18/21 ?0932  ?WBC 4.2 3.9*  ?HGB 12.9* 13.2  ?HCT 40.7 41.2  ?PLT 164 155  ?CREATININE 1.03 0.99  ?ALBUMIN 3.6  --   ?PROT 6.8  --   ?AST 16  --   ?ALT 8  --   ?ALKPHOS 53  --   ?BILITOT 0.5  --   ? ?Estimated Creatinine Clearance: 87.7 mL/min (by C-G formula based on SCr of 0.99 mg/dL).  ? ?Medications:  ?Scheduled:  ? enoxaparin (LOVENOX) injection  40 mg Subcutaneous Q24H  ? erythromycin   Right Eye Q8H  ? hydrocerin   Topical BID  ? insulin aspart  0-6 Units Subcutaneous Q4H  ? ? ?Assessment: ?Patient admitted with AMS, and unable to received oral medication as this time. Aspiration precautions, and all meds transityion to IV route. MD request pharmacist assistance to transition Depakote to Valproic Acid IVPB. ? ?Depakote 1000 mg BID prior to admission.  ? ? ?Plan:  ?Valproic acid 500mg  IVPB q 6 hours = 2000 mg valproic acid per day ?Transition back to Depakote as soon as able to take po. ? ?Teale Goodgame Rodriguez-Guzman PharmD, BCPS ?09/18/2021 3:28 PM ? ? ? ? ?

## 2021-09-18 NOTE — Assessment & Plan Note (Addendum)
Cause of patient's encephalopathy. ?Patient has reportedly been noncompliant with his medications prior to admission.  Similar presentations have been noted previously.   ?Remains on Depakote, dose was reduced. ?As needed Haldol.  Started on ECT.  Psychiatry feels he is close to his baseline.  Working on finding a facility for patient to go to. ?

## 2021-09-18 NOTE — Assessment & Plan Note (Addendum)
-   Resumed home Keppra to 50 mg p.o. changed to IV twice daily ?- Resumed home Depakote 1000 mg p.o. twice daily, clonazepam 0.5 mg p.o. twice daily ?- Ativan 2 mg every 4 hours as needed for seizure, anxiety, 4 doses ordered ?- Seizure precautions ?

## 2021-09-18 NOTE — ED Notes (Addendum)
Lab contacted to collect patient's 5am labs. Per lab, unable to come and stick patient until "charge nurse has stuck patient's AC". Will attempt again to obtain labs.  ?

## 2021-09-18 NOTE — Progress Notes (Signed)
?PROGRESS NOTE ?  ?[name]  BTD:[id number]    DOB: Nov 04, 1955    DOA: 09/17/2021 ? ?PCP: Koren Bound, NP  ? ?I have briefly reviewed patients previous medical records in [facility] ? ?Chief Complaint  ?Patient presents with  ? Altered Mental Status  ? ? ?Hospital Course:  ?66 year old male with medical history significant for schizophrenia, hypertension, hyperlipidemia, hypothyroid, medication noncompliance, recurrent seizures, tardive dyskinesia, was brought in from ALF by EMS to ED with altered mental status after he was found to be somnolent, not responding to questions as typical and with right eye drainage.  EMS reported that patient was found on the floor by staff at ALF on 3/13 morning.  Per EDP note, patient was not taking his medications.  In ED, agitated and combative and received IV Haldol 5 mg x 2 and Ativan 2 mg IV x2 on night of arrival.  Admitted for acute encephalopathy likely due to his uncontrolled primary psychiatric disorder i.e. schizophrenia.  Psychiatry has been consulted. ? ? ?Assessment & Plan:  ?Assessment and Plan: ?* Acute encephalopathy ?Medical work-up largely unremarkable as below ?No CO2 retention on VBG ?CMP, CBC, TSH, ammonia level, flu and COVID-19 RT-PCR all unremarkable. ?Chest x-ray and CT head without acute abnormalities. ?Psychiatry has been consulted, await input. ?N.p.o. until safe to take orally. ?Check UDS and urine microscopy when able. ?Changed Depakote to IV. ? ?Acute conjunctivitis, right eye ?Noted matting of lateral right eyelids ?No obvious purulent drainage ?Viral versus bacterial ?Initiated erythromycin eye ointment. ?Monitor closely. ? ?Schizophrenia (HCC) ?Unable/unsafe for p.o. meds ?As needed IV Haldol, Depakote. ?EKG showed QTc of 450.  Monitor daily EKG for QTc prolongation. ?Await psychiatry input for medication management. ? ?Seizure (HCC) ?Unsafe to take p.o. due to AMS and sedation from meds. ?Change Keppra and Depakote IV until safe to  tolerate p.o. ?As needed IV Ativan for seizures. ?Seizure precautions. ? ?HTN (hypertension) ?Continue as needed IV labetalol until safe to take p.o. consistently. ? ?Diabetes (HCC) ?Monitor CBGs and SSI. ?Discontinue metformin. ? ?Hyperlipidemia ?TSH 0.969/WNL ?Resume Synthroid when able. ? ?Hypothyroid ?Resume oral supplements when able. ?TSH normal. ? ?Noncompliance ?Unclear how this happens at the ALF, likely due to patient's refusal to take meds. ??  Option to give long-acting/Depo IM medications. ? ?Bilateral lower extremity irritant contact dermatitis ?Due to wearing several layers of moist socks for several days ?WOC RN input much appreciated. ? ? ?Body mass index is 26.75 kg/m?Marland Kitchen ?Nutritional Status ?  ?  ?  ?Pressure Ulcer: ?  ? ?DVT prophylaxis: enoxaparin (LOVENOX) injection 40 mg Start: 09/17/21 2200   ?  Code Status: Full Code:  ?Family Communication: None at bedside ?Disposition:  ?Status is: Inpatient ?Remains inpatient appropriate because: Severity of illness, IV meds ?  ? ?Consultants:   ?Psychiatric ? ?Procedures:   ? ? ?Antimicrobials:   ?Erythromycin eye ointment ? ? ?Subjective:  ?In this morning while in ED.  Sedated from multiple doses of IV meds as noted above.  To call or touch barely arousable, moves all extremities symmetrically but no verbal response or eye-opening. ? ?Objective:  ? ?Vitals:  ? 09/18/21 1000 09/18/21 1258 09/18/21 1327 09/18/21 1504  ?BP: (!) 137/91 (!) 158/90 (!) 163/90 (!) 161/98  ?Pulse: 91 (!) 104 (!) 104 (!) 107  ?Resp: 17 18 19 18   ?Temp:  98.5 ?F (36.9 ?C) 98.4 ?F (36.9 ?C) 98.5 ?F (36.9 ?C)  ?TempSrc:  Axillary  Oral  ?SpO2: 99% 98% 100% 94%  ?Weight:      ?  Height:      ? ? ?General exam: Middle-age male, moderately built and poorly nourished, looks older than stated age, lying diagonally in bed with his legs hanging outside the bed. ?Eyes: Matting of lateral eyelashes of right eye but no overt drainage.  Mild nasal and temporal conjunctival congestion.  Pupils  equally reacting to light and accommodation. ?Respiratory system: Clear to auscultation. Respiratory effort normal. ?Cardiovascular system: S1 & S2 heard, RRR. No JVD, murmurs, rubs, gallops or clicks. No pedal edema.  Telemetry personally reviewed at bedside: Sinus rhythm with occasional PVCs. ?Gastrointestinal system: Abdomen is nondistended, soft and nontender. No organomegaly or masses felt. Normal bowel sounds heard. ?Central nervous system: Mental status as noted above. No focal neurological deficits. ?Extremities: Moves all limbs symmetrically and normally. ?Skin: Chronic skin changes of bilateral legs, details per WOC RN note from 3/14. ?Psychiatry: Judgement and insight cannot be assessed at this time.  Mood & affect cannot be assessed at this time. ? ? ? ?Data Reviewed:   ?I have personally reviewed following labs and imaging studies ? ? ?CBC: ?Recent Labs  ?Lab 09/17/21 ?1813 09/18/21 ?0932  ?WBC 4.2 3.9*  ?HGB 12.9* 13.2  ?HCT 40.7 41.2  ?MCV 83.7 83.2  ?PLT 164 155  ? ? ?Basic Metabolic Panel: ?Recent Labs  ?Lab 09/17/21 ?1813 09/18/21 ?0932  ?NA 141 143  ?K 4.0 4.2  ?CL 102 106  ?CO2 30 30  ?GLUCOSE 97 85  ?BUN 26* 27*  ?CREATININE 1.03 0.99  ?CALCIUM 8.7* 8.7*  ? ? ?Liver Function Tests: ?Recent Labs  ?Lab 09/17/21 ?1813  ?AST 16  ?ALT 8  ?ALKPHOS 53  ?BILITOT 0.5  ?PROT 6.8  ?ALBUMIN 3.6  ? ? ?CBG: ?No results for input(s): GLUCAP in the last 168 hours. ? ?Microbiology Studies:  ? ?Recent Results (from the past 240 hour(s))  ?Resp Panel by RT-PCR (Flu A&B, Covid) Nasopharyngeal Swab     Status: None  ? Collection Time: 09/17/21  7:45 PM  ? Specimen: Nasopharyngeal Swab; Nasopharyngeal(NP) swabs in vial transport medium  ?Result Value Ref Range Status  ? SARS Coronavirus 2 by RT PCR NEGATIVE NEGATIVE Final  ?  Comment: (NOTE) ?SARS-CoV-2 target nucleic acids are NOT DETECTED. ? ?The SARS-CoV-2 RNA is generally detectable in upper respiratory ?specimens during the acute phase of infection. The  lowest ?concentration of SARS-CoV-2 viral copies this assay can detect is ?138 copies/mL. A negative result does not preclude SARS-Cov-2 ?infection and should not be used as the sole basis for treatment or ?other patient management decisions. A negative result may occur with  ?improper specimen collection/handling, submission of specimen other ?than nasopharyngeal swab, presence of viral mutation(s) within the ?areas targeted by this assay, and inadequate number of viral ?copies(<138 copies/mL). A negative result must be combined with ?clinical observations, patient history, and epidemiological ?information. The expected result is Negative. ? ?Fact Sheet for Patients:  ?BloggerCourse.comhttps://www.fda.gov/media/152166/download ? ?Fact Sheet for Healthcare Providers:  ?SeriousBroker.ithttps://www.fda.gov/media/152162/download ? ?This test is no t yet approved or cleared by the [place] FDA and  ?has been authorized for detection and/or diagnosis of SARS-CoV-2 by ?FDA under an Emergency Use Authorization (EUA). This EUA will remain  ?in effect (meaning this test can be used) for the duration of the ?COVID-19 declaration under Section 564(b)(1) of the Act, 21 ?U.S.C.section 360bbb-3(b)(1), unless the authorization is terminated  ?or revoked sooner.  ? ? ?  ? Influenza A by PCR NEGATIVE NEGATIVE Final  ? Influenza B by PCR NEGATIVE NEGATIVE Final  ?  Comment: (NOTE) ?The Xpert Xpress SARS-CoV-2/FLU/RSV plus assay is intended as an aid ?in the diagnosis of influenza from Nasopharyngeal swab specimens and ?should not be used as a sole basis for treatment. Nasal washings and ?aspirates are unacceptable for Xpert Xpress SARS-CoV-2/FLU/RSV ?testing. ? ?Fact Sheet for Patients: ?BloggerCourse.com ? ?Fact Sheet for Healthcare Providers: ?SeriousBroker.it ? ?This test is not yet approved or cleared by the [place] FDA and ?has been authorized for detection and/or diagnosis of SARS-CoV-2 by ?FDA under  an Emergency Use Authorization (EUA). This EUA will remain ?in effect (meaning this test can be used) for the duration of the ?COVID-19 declaration under Section 564(b)(1) of the Act, 21 U.S.C. ?section 7432004278(

## 2021-09-18 NOTE — Assessment & Plan Note (Addendum)
Metformin on hold.  On sliding scale.  Added some low-dose Lantus now that he is able to take p.o. and CBGs have been trending upward.  Has been difficult to track since patient for the most part will not let his blood sugars be checked.  Checking A1c. ?

## 2021-09-18 NOTE — Progress Notes (Signed)
The psych team is unable to assess the patient at this time. The patient was given IM for agitation. The patient has been accepted to medical services. ?

## 2021-09-18 NOTE — Evaluation (Addendum)
Physical Therapy Evaluation ?Patient Details ?Name: [name] ?MRN: [id number] ?DOB: 02-26-1956 ?Today's Date: 09/18/2021 ? ?History of Present Illness ? Pt is a 66 y.o. male who presents to the ED from his ALF by EMS due to AMS after being found somnolent and not responding to questions as typical. Currently admitted for acute encephalopathy likely due to his uncontrolled primary psychiatric disorder i.e. schizophrenia. PmHx: Hypothyroid, HLD, HTN, Seizure, Agitation, Schizophrenia. ?  ?Clinical Impression ? Pt standing at bedside w/ RN and Nurse Tech trying to get Pt back into bed upon PT entrance into room for evaluation. Per RN Pt was impulsively trying to get out of bed, which resulted in bed alarm going off and RN and Nurse Tech entrance to room. PLOF was obtained from group home owner Rowan BlaseLawanda over the phone, "see home living and prior function" sections. ? ?When trying to assess Pt's A&O, he initially said his name was "[name]" and by the end of the session was able state his full name and his DOB. Pt followed 1 step commands inconsistently, requiring increased verbal and tactile cues for initiation/carry out the desired task. Pt required varying levels of assistance (minA-modAx2) for ambulation (~5530ft) assistance for RW negotiation and tactile support for unsteadiness w/ ambulation and standing. He returned to bed following ambulation as a result of fatigue, but required minA for LE transfer back into bed due to persistence to not return to supine. Pt would benefit from continued skilled PT in order to address deficits noted in "Problems List" to return to PLOF. Current discharge recommendation to home w/ 24/7 assistance/supervision and 2-person assistance w/ mobility, if unable to obtain, recommendation to SNF. ?  ?   ? ?Recommendations for follow up therapy are one component of a multi-disciplinary discharge planning process, led by the attending physician.  Recommendations may be updated based on patient  status, additional functional criteria and insurance authorization. ? ?Follow Up Recommendations Other (comment) (Pt may return to prior living situation if he can obtain 24/7 assistance/superivsion and 2-person assist for mobility, if unable recommend SNF) ? ?  ?Assistance Recommended at Discharge Frequent or constant Supervision/Assistance  ?Patient can return home with the following ? Two people to help with walking and/or transfers;Two people to help with bathing/dressing/bathroom;Assistance with cooking/housework;Assistance with feeding;Assist for transportation;Help with stairs or ramp for entrance;Direct supervision/assist for medications management;Direct supervision/assist for financial management ? ?  ?Equipment Recommendations Other (comment) (to assess w/ continued PT visits)  ?Recommendations for Other Services ?    ?  ?Functional Status Assessment Patient has had a recent decline in their functional status and demonstrates the ability to make significant improvements in function in a reasonable and predictable amount of time.  ? ?  ?Precautions / Restrictions Precautions ?Precautions: Fall ?Restrictions ?Weight Bearing Restrictions: No  ? ?  ? ?Mobility ? Bed Mobility ?Overal bed mobility: Needs Assistance ?Bed Mobility: Sit to Supine ?  ?  ?  ?Sit to supine: Min assist ?  ?General bed mobility comments: Pt. impulsively returned to supine following ambulation due to fatigue. ?  ? ?Transfers ?Overall transfer level: Needs assistance ?Equipment used: Rolling walker (2 wheels) ?Transfers: Sit to/from Stand ?Sit to Stand: Mod assist, +2 physical assistance, Total assist ?  ?  ?  ?  ?  ?General transfer comment: total assist to sit patient back in chair due to impulsivity upon entrance into room; modAx2 for sit to stand w/ ambulation. ?  ? ?Ambulation/Gait ?Ambulation/Gait assistance: +2 physical assistance, Mod assist ?Gait Distance (Feet):  30 Feet ?Assistive device: Rolling walker (2 wheels) ?Gait  Pattern/deviations: Scissoring, Decreased step length - right, Decreased step length - left, Decreased stride length ?Gait velocity: decreased ?  ?  ?General Gait Details: required minAx2 for RW negotiation and hands on assist due to instability/balance deficits ? ?Stairs ?  ?  ?  ?  ?  ? ?Wheelchair Mobility ?  ? ?Modified Rankin (Stroke Patients Only) ?  ? ?  ? ?Balance Overall balance assessment: Needs assistance ?Sitting-balance support: Feet supported, Feet unsupported, Single extremity supported, Bilateral upper extremity supported ?Sitting balance-Leahy Scale: Good ?Sitting balance - Comments: was able to attempt to don socks prior to ambulation ?  ?Standing balance support: During functional activity, Reliant on assistive device for balance, Bilateral upper extremity supported, Single extremity supported ?Standing balance-Leahy Scale: Poor ?Standing balance comment: Required tactile cues for proper UE usage of DME, but varied between single and bilateral UE support. ?  ?  ?  ?  ?  ?  ?  ?  ?  ?  ?  ?   ? ? ? ?Pertinent Vitals/Pain Pain Assessment ?Pain Assessment: No/denies pain  ? ? ?Home Living Family/patient expects to be discharged to:: Group home ?Living Arrangements: Non-relatives/Friends;Other (Comment) (Family Care Home) ?Available Help at Discharge: Other (Comment);Available 24 hours/day (Group home staff) ?Type of Home: Group Home ?Home Access: Level entry ?  ?  ?  ?Home Layout: One level ?Home Equipment: None ?Additional Comments: Spoke w/ Lawanda (owner of group home) to obtain his PLOF.  ?  ?Prior Function Prior Level of Function : Independent/Modified Independent ?  ?  ?  ?  ?  ?  ?  ?  ?  ? ? ?Hand Dominance  ?   ? ?  ?Extremity/Trunk Assessment  ? Upper Extremity Assessment ?Upper Extremity Assessment: Overall WFL for tasks assessed ?  ? ?Lower Extremity Assessment ?Lower Extremity Assessment: Overall WFL for tasks assessed;Generalized weakness ?  ? ?   ?Communication  ? Communication: Other  (comment) (difficult to assess due to confusion)  ?Cognition Arousal/Alertness: Awake/alert ?Behavior During Therapy: Restless, Impulsive ?Overall Cognitive Status: Impaired/Different from baseline (Spoke w/ Lawanda on phone who owns group home to obtain baseline cognitive status.) ?Area of Impairment: Orientation, Attention, Awareness, Following commands, Safety/judgement, Problem solving ?  ?  ?  ?  ?  ?  ?  ?  ?Orientation Level: Disoriented to, Person, Place, Time, Situation (Originally said his name was Gregory Fellers, then was able to state full name and birthdate at end of session.) ?  ?  ?Following Commands: Follows one step commands inconsistently ?Safety/Judgement: Decreased awareness of safety, Decreased awareness of deficits ?  ?Problem Solving: Slow processing, Requires verbal cues, Requires tactile cues, Difficulty sequencing ?  ?  ?  ? ?  ?General Comments   ? ?  ?Exercises    ? ?Assessment/Plan  ?  ?PT Assessment Patient needs continued PT services  ?PT Problem List Decreased strength;Decreased mobility;Decreased range of motion;Decreased coordination;Decreased safety awareness;Decreased knowledge of precautions;Decreased activity tolerance;Decreased cognition;Decreased balance;Decreased knowledge of use of DME ? ?   ?  ?PT Treatment Interventions DME instruction;Therapeutic activities;Gait training;Therapeutic exercise;Stair training;Balance training;Functional mobility training;Neuromuscular re-education   ? ?PT Goals (Current goals can be found in the Care Plan section)  ?Acute Rehab PT Goals ?PT Goal Formulation: Patient unable to participate in goal setting ? ?  ?Frequency Min 2X/week ?  ? ? ?Co-evaluation   ?  ?  ?  ?  ? ? ?  ?AM-PAC  PT "6 Clicks" Mobility  ?Outcome Measure Help needed turning from your back to your side while in a flat bed without using bedrails?: A Little ?Help needed moving from lying on your back to sitting on the side of a flat bed without using bedrails?: A Little ?Help needed  moving to and from a bed to a chair (including a wheelchair)?: A Little ?Help needed standing up from a chair using your arms (e.g., wheelchair or bedside chair)?: A Little ?Help needed to walk in hospital room

## 2021-09-18 NOTE — Plan of Care (Signed)

## 2021-09-18 NOTE — Assessment & Plan Note (Addendum)
PRN labetalol.  Started on clonidine patch given very poor PO intake and NPO with high aspiration risk, encephalopathy.  Now that he is taking p.o., will restart additional medications.  Blood pressure has been trending upward in the last 24 hours with systolic in the 150s ?

## 2021-09-18 NOTE — Assessment & Plan Note (Addendum)
TSH normal.  We will see if he can switch him to p.o. ?

## 2021-09-18 NOTE — Assessment & Plan Note (Deleted)
TSH 0.969/WNL ?Resume Synthroid when able. ?

## 2021-09-18 NOTE — Progress Notes (Signed)
Admission profile updated. ?

## 2021-09-18 NOTE — Assessment & Plan Note (Deleted)
Unclear how this happens at the ALF, likely due to patient's refusal to take meds. ?

## 2021-09-18 NOTE — Consult Note (Signed)
WOC Nurse Consult Note: ?Reason for Consult: Patient with irritant contact dermatitis to bilateral feet from wearing several layers of moist socks for several days. Nursing soaked feet and socks early this morning and were able to remove. Feet now with very dry skin, no wounds. ?Wound type:N/A ?Pressure Injury POA:N/A ?Measurement:N/A ?Wound bed:N/A ?Drainage (amount, consistency, odor) None ?Periwound:N/A ?Dressing procedure/placement/frequency: ?I will provide Nursing with guidance for twice daily cleansing of the patients legs and feet followed by gently patting them dry and drying thoroughly between the toes. Cleansing is to be followed by a thin application of Eucerin cream to the LEs and feet, but not between the toes.  ? ?I discussed the POC with Molli Hazard, the Bedside RN. There is no apparent need to involve any other service such as Vascular or Podiatry at this time. ? ?Thank you for this consult. ? ?WOC nursing team will not follow, but will remain available to this patient, the nursing and medical teams.  Please re-consult if needed. ? ?Ladona Mow, MSN, RN, GNP, CWOCN, CWON-AP, FAAN  ?Pager# [phone]  ? ? ?  ?

## 2021-09-18 NOTE — ED Notes (Addendum)
Pt resting comfortably in bed,rails up x2, on monitor with bed alarm applied. Arousable to touch, unable to answer questions. Makes purposeful movement. VSS ?
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
## 2021-09-19 DIAGNOSIS — E039 Hypothyroidism, unspecified: Secondary | ICD-10-CM

## 2021-09-19 DIAGNOSIS — F203 Undifferentiated schizophrenia: Secondary | ICD-10-CM | POA: Diagnosis not present

## 2021-09-19 DIAGNOSIS — G9340 Encephalopathy, unspecified: Secondary | ICD-10-CM | POA: Diagnosis not present

## 2021-09-19 DIAGNOSIS — H1031 Unspecified acute conjunctivitis, right eye: Secondary | ICD-10-CM | POA: Diagnosis not present

## 2021-09-19 LAB — COMPREHENSIVE METABOLIC PANEL
ALT: 9 U/L (ref 0–44)
AST: 20 U/L (ref 15–41)
Albumin: 3.3 g/dL — ABNORMAL LOW (ref 3.5–5.0)
Alkaline Phosphatase: 55 U/L (ref 38–126)
Anion gap: 6 (ref 5–15)
BUN: 30 mg/dL — ABNORMAL HIGH (ref 8–23)
CO2: 29 mmol/L (ref 22–32)
Calcium: 8.4 mg/dL — ABNORMAL LOW (ref 8.9–10.3)
Chloride: 107 mmol/L (ref 98–111)
Creatinine, Ser: 1.03 mg/dL (ref 0.61–1.24)
GFR, Estimated: >60 mL/min (ref >60)
Glucose, Bld: 97 mg/dL (ref 70–99)
Potassium: 3.7 mmol/L (ref 3.5–5.1)
Sodium: 142 mmol/L (ref 135–145)
Total Bilirubin: 0.7 mg/dL (ref 0.3–1.2)
Total Protein: 7.4 g/dL (ref 6.5–8.1)

## 2021-09-19 LAB — CBC
HCT: 39.8 % (ref 39.0–52.0)
Hemoglobin: 13.1 g/dL (ref 13.0–17.0)
MCH: 27.3 pg (ref 26.0–34.0)
MCHC: 32.9 g/dL (ref 30.0–36.0)
MCV: 82.9 fL (ref 80.0–100.0)
Platelets: 150 10*3/uL (ref 150–400)
RBC: 4.8 MIL/uL (ref 4.22–5.81)
RDW: 14.5 % (ref 11.5–15.5)
WBC: 3.9 10*3/uL — ABNORMAL LOW (ref 4.0–10.5)
nRBC: 0 % (ref 0.0–0.2)

## 2021-09-19 LAB — GLUCOSE, CAPILLARY
Glucose-Capillary: 102 mg/dL — ABNORMAL HIGH (ref 70–99)
Glucose-Capillary: 105 mg/dL — ABNORMAL HIGH (ref 70–99)
Glucose-Capillary: 115 mg/dL — ABNORMAL HIGH (ref 70–99)
Glucose-Capillary: 119 mg/dL — ABNORMAL HIGH (ref 70–99)
Glucose-Capillary: 88 mg/dL (ref 70–99)
Glucose-Capillary: 94 mg/dL (ref 70–99)

## 2021-09-19 LAB — BLOOD GAS, VENOUS
Acid-Base Excess: 6.3 mmol/L — ABNORMAL HIGH (ref 0.0–2.0)
Bicarbonate: 32.3 mmol/L — ABNORMAL HIGH (ref 20.0–28.0)
O2 Saturation: 85.7 %
Patient temperature: 37
pCO2, Ven: 51 mmHg (ref 44–60)
pH, Ven: 7.41 (ref 7.25–7.43)
pO2, Ven: 55 mmHg — ABNORMAL HIGH (ref 32–45)

## 2021-09-19 MED ORDER — HALOPERIDOL LACTATE 5 MG/ML IJ SOLN: 2.5000 mg | Freq: Four times a day (QID) | INTRAMUSCULAR | Status: AC | PRN

## 2021-09-19 MED ORDER — HALOPERIDOL LACTATE 5 MG/ML IJ SOLN: 2.5000 mg | Freq: Four times a day (QID) | INTRAMUSCULAR | Status: DC | PRN

## 2021-09-19 NOTE — TOC Progression Note (Signed)
Transition of Care (TOC) - Progression Note  ? ? ?Patient Details  ?Name: [name] ?MRN: [id number] ?Date of Birth: Nov 07, 1955 ? ?Transition of Care (TOC) CM/SW Contact  ?Chapman Fitch, RN ?Phone Number: ?09/19/2021, 1:26 PM ? ?Clinical Narrative:    ? ?Per Drucilla Schmidt with DSS no preference of home health agency ? ?Expected Discharge Plan: Group Home ?Barriers to Discharge: Continued Medical Work up ? ?Expected Discharge Plan and Services ?Expected Discharge Plan: Group Home ?  ?Discharge Planning Services: CM Consult ?Post Acute Care Choice: Home Health ?Living arrangements for the past 2 months: Group Home ?                ?  ?  ?  ?  ?  ?  ?  ?  ?  ?  ? ? ?Social Determinants of Health (SDOH) Interventions ?  ? ?Readmission Risk Interventions ?No flowsheet data found. ? ?
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
## 2021-09-19 NOTE — Procedures (Signed)
Pt refused eeg 
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
## 2021-09-19 NOTE — Progress Notes (Signed)
[facility] PROGRESS NOTE   [name] ACZ:[id number] DOB: [birth year] DOA: 09/17/2021  1 DOS: the patient was seen and examined on 09/19/2021  PCP: Koren Bound, NP  Brief History and Hospital Course:  66 year old male with medical history significant for schizophrenia, hypertension, hyperlipidemia, hypothyroid, medication noncompliance, recurrent seizures, tardive dyskinesia, was brought in from ALF by EMS to ED with altered mental status after he was found to be somnolent, not responding to questions as typical and with right eye drainage.  EMS reported that patient was found on the floor by staff at ALF on 3/13 morning.  Per EDP note, patient was not taking his medications.  In ED, agitated and combative and received IV Haldol 5 mg x 2 and Ativan 2 mg IV x2 on night of arrival.  Admitted for acute encephalopathy likely due to his uncontrolled primary psychiatric disorder i.e. schizophrenia.  Psychiatry has been consulted.  Consultants: Psychiatry  Procedures: EEG will be ordered    Subjective: Remains confused.  Not nonresponsive for the most part.  Agitated at times.    Assessment/Plan:   * Schizophrenia (HCC) Likely driving his encephalopathy.  Patient has been noncompliant with his medications.  Similar presentations have been noted previously.  Psychiatry is following and managing.  Noted to be on olanzapine and Depakote and given a dose of Haldol Decanoate.    Acute encephalopathy Medical work-up largely unremarkable as below No CO2 retention on VBG CMP, CBC, TSH, ammonia level, flu and COVID-19 RT-PCR all unremarkable. Chest x-ray and CT head without acute abnormalities. N.p.o. until safe to take orally. Urine studies are pending.  Depakote was changed to IV.  Psychiatry has seen the patient.  Patient started on Zyprexa intramuscularly.  Patient also given a dose of Haldol decanoate. Patient remains encephalopathic.  Acute conjunctivitis, right eye Started  on erythromycin eye ointment.  Continue to monitor.    Seizure (HCC) Keppra and Depakote were changed over to intravenous formulations.  Continue to monitor.  No obvious seizure activity noted.  EEG will be ordered.  HTN (hypertension) Continue as needed IV labetalol until safe to take p.o. consistently.  Diabetes (HCC) Metformin on hold.  CBGs are reasonably well controlled.  Continue SSI.  Hypothyroid Resume oral supplements when able. TSH normal.  Noncompliance Unclear how this happens at the ALF, likely due to patient's refusal to take meds.     DVT Prophylaxis: Lovenox Code Status: Full code Family Communication: No family at bedside Disposition Plan: Hopefully return back to his ALF when improved  Status is: Inpatient Remains inpatient appropriate because: Altered mental status     Medications: Scheduled:  enoxaparin (LOVENOX) injection  40 mg Subcutaneous Q24H   erythromycin   Right Eye Q8H   haloperidol decanoate  200 mg Intramuscular Q30 days   hydrocerin   Topical BID   insulin aspart  0-6 Units Subcutaneous Q4H   OLANZapine  15 mg Intramuscular BID   Continuous:  levETIRAcetam 250 mg (09/19/21 0543)   valproate sodium 500 mg (09/19/21 0855)   FUX:NATFTDDUKGURK **OR** acetaminophen, haloperidol lactate, labetalol, LORazepam, ondansetron **OR** ondansetron (ZOFRAN) IV  Antibiotics: Anti-infectives (From admission, onward)    None       Objective:  Vital Signs  Vitals:   09/18/21 1504 09/18/21 2028 09/18/21 2311 09/19/21 0501  BP: (!) 161/98 (!) 145/112 (!) 156/112 (!) 146/87  Pulse: (!) 107 (!) 109 (!) 117 (!) 109  Resp: 18 20  19   Temp: 98.5 F (36.9 C)     TempSrc:  Oral     SpO2: 94% 98%  95%  Weight:      Height:        Intake/Output Summary (Last 24 hours) at 09/19/2021 1017 Last data filed at 09/19/2021 0617 Gross per 24 hour  Intake 0 ml  Output 200 ml  Net -200 ml   Filed Weights   09/17/21 1811  Weight: 97.1 kg     General appearance: Somnolent with.  Some agitation Resp: Clear to auscultation bilaterally.  Normal effort Cardio: S1-S2 is normal regular.  No S3-S4.  No rubs murmurs or bruit GI: Abdomen is soft.  Nontender nondistended.  Bowel sounds are present normal.  No masses organomegaly Extremities: No edema.  Moving all of his extremities Neurologic:  No focal neurological deficits.    Lab Results:  Data Reviewed: I have personally reviewed labs and imaging study reports  CBC: Recent Labs  Lab 09/17/21 1813 09/18/21 0932 09/19/21 0450  WBC 4.2 3.9* 3.9*  HGB 12.9* 13.2 13.1  HCT 40.7 41.2 39.8  MCV 83.7 83.2 82.9  PLT 164 155 150    Basic Metabolic Panel: Recent Labs  Lab 09/17/21 1813 09/18/21 0932 09/19/21 0450  NA 141 143 142  K 4.0 4.2 3.7  CL 102 106 107  CO2 30 30 29   GLUCOSE 97 85 97  BUN 26* 27* 30*  CREATININE 1.03 0.99 1.03  CALCIUM 8.7* 8.7* 8.4*    GFR: Estimated Creatinine Clearance: 84.3 mL/min (by C-G formula based on SCr of 1.03 mg/dL).  Liver Function Tests: Recent Labs  Lab 09/17/21 1813 09/19/21 0450  AST 16 20  ALT 8 9  ALKPHOS 53 55  BILITOT 0.5 0.7  PROT 6.8 7.4  ALBUMIN 3.6 3.3*    Recent Labs  Lab 09/17/21 1813  AMMONIA 14      CBG: Recent Labs  Lab 09/18/21 1720 09/18/21 1956 09/19/21 0029 09/19/21 0357 09/19/21 0730  GLUCAP 85 97 88 102* 105*     Thyroid Function Tests: Recent Labs    09/18/21 0932  TSH 1.345     Recent Results (from the past 240 hour(s))  Resp Panel by RT-PCR (Flu A&B, Covid) Nasopharyngeal Swab     Status: None   Collection Time: 09/17/21  7:45 PM   Specimen: Nasopharyngeal Swab; Nasopharyngeal(NP) swabs in vial transport medium  Result Value Ref Range Status   SARS Coronavirus 2 by RT PCR NEGATIVE NEGATIVE Final    Comment: (NOTE) SARS-CoV-2 target nucleic acids are NOT DETECTED.  The SARS-CoV-2 RNA is generally detectable in upper respiratory specimens during the acute phase  of infection. The lowest concentration of SARS-CoV-2 viral copies this assay can detect is 138 copies/mL. A negative result does not preclude SARS-Cov-2 infection and should not be used as the sole basis for treatment or other patient management decisions. A negative result may occur with  improper specimen collection/handling, submission of specimen other than nasopharyngeal swab, presence of viral mutation(s) within the areas targeted by this assay, and inadequate number of viral copies(<138 copies/mL). A negative result must be combined with clinical observations, patient history, and epidemiological information. The expected result is Negative.  Fact Sheet for Patients:  BloggerCourse.com  Fact Sheet for Healthcare Providers:  SeriousBroker.it  This test is no t yet approved or cleared by the [place] FDA and  has been authorized for detection and/or diagnosis of SARS-CoV-2 by FDA under an Emergency Use Authorization (EUA). This EUA will remain  in effect (meaning this test can be used) for  the duration of the COVID-19 declaration under Section 564(b)(1) of the Act, 21 U.S.C.section 360bbb-3(b)(1), unless the authorization is terminated  or revoked sooner.       Influenza A by PCR NEGATIVE NEGATIVE Final   Influenza B by PCR NEGATIVE NEGATIVE Final    Comment: (NOTE) The Xpert Xpress SARS-CoV-2/FLU/RSV plus assay is intended as an aid in the diagnosis of influenza from Nasopharyngeal swab specimens and should not be used as a sole basis for treatment. Nasal washings and aspirates are unacceptable for Xpert Xpress SARS-CoV-2/FLU/RSV testing.  Fact Sheet for Patients: BloggerCourse.com  Fact Sheet for Healthcare Providers: SeriousBroker.it  This test is not yet approved or cleared by the [place] FDA and has been authorized for detection and/or diagnosis of  SARS-CoV-2 by FDA under an Emergency Use Authorization (EUA). This EUA will remain in effect (meaning this test can be used) for the duration of the COVID-19 declaration under Section 564(b)(1) of the Act, 21 U.S.C. section 360bbb-3(b)(1), unless the authorization is terminated or revoked.  Performed at [facility] [address] [place], [place] [address]   MRSA Next Gen by PCR, Nasal     Status: None   Collection Time: 09/18/21 10:21 AM   Specimen: Nasal Mucosa; Nasal Swab  Result Value Ref Range Status   MRSA by PCR Next Gen NOT DETECTED NOT DETECTED Final    Comment: (NOTE) The GeneXpert MRSA Assay (FDA approved for NASAL specimens only), is one component of a comprehensive MRSA colonization surveillance program. It is not intended to diagnose MRSA infection nor to guide or monitor treatment for MRSA infections. Test performance is not FDA approved in patients less than 72 years old. Performed at [facility], [address] [place], [place] [address]       Radiology Studies: CT Head Wo Contrast  Result Date: 09/17/2021 CLINICAL DATA:  Altered mental status EXAM: CT HEAD WITHOUT CONTRAST TECHNIQUE: Contiguous axial images were obtained from the base of the skull through the vertex without intravenous contrast. RADIATION DOSE REDUCTION: This exam was performed according to the departmental dose-optimization program which includes automated exposure control, adjustment of the mA and/or kV according to patient size and/or use of iterative reconstruction technique. COMPARISON:  CT dated August 10, 2021 FINDINGS: Brain: Chronic [place] matter ischemic change. No evidence of acute infarction, hemorrhage, hydrocephalus, extra-axial collection or mass lesion/mass effect. Vascular: No hyperdense vessel or unexpected calcification. Skull: Normal. Negative for fracture or focal lesion. Sinuses/Orbits: No acute finding. Other: None. IMPRESSION: No acute intracranial  abnormality. Electronically Signed   By: Allegra Lai M.D.   On: 09/17/2021 20:05   DG Chest Portable 1 View  Result Date: 09/17/2021 CLINICAL DATA:  Altered level of consciousness, found down EXAM: PORTABLE CHEST 1 VIEW COMPARISON:  07/30/2020 FINDINGS: Frontal view of the chest demonstrates a stable cardiac silhouette. No acute airspace disease, effusion, or pneumothorax. No acute displaced fracture. IMPRESSION: 1. No acute intrathoracic process. Electronically Signed   By: Sharlet Salina M.D.   On: 09/17/2021 20:43       LOS: 1 day   Osvaldo Shipper  [facility] Pager on [email or web address]  09/19/2021, 10:17 AM

## 2021-09-19 NOTE — Progress Notes (Signed)
PT Cancellation Note ? ?Patient Details ?Name: [name] ?MRN: [id number] ?DOB: [birth year] ? ? ?Cancelled Treatment:     PT attempt. Pt currently not appropriate to participate. Has recently been issued haldol. RN staff to contact author later in the day if pt becomes more appropriate to participate. See previous days PT note for DC recs.  ? ? ?Rushie Chestnut ?09/19/2021, 2:00 PM ?

## 2021-09-19 NOTE — TOC Initial Note (Addendum)
Transition of Care (TOC) - Initial/Assessment Note  ? ? ?Patient Details  ?Name: [name] ?MRN: [id number] ?Date of Birth: July 13, 1955 ? ?Transition of Care (TOC) CM/SW Contact:    ?Beverly Sessions, RN ?Phone Number: ?09/19/2021, 10:36 AM ? ?Clinical Narrative:                 ? ?Admitted for: AMS ?[facility] ?PCP: Daphane Shepherd ?Pharmacy: [facility]  ?Current home health/prior home health/DME: No DME, No home health  ? ?Spoke with Doristine Counter [phone].  Patient can return at discharge.  Group home will transport at discharge ?Fl2 and DC summary to be faxed to [phone] ? ?Per Brandi patient mobile with no DME at baseline.  If needed patient will require a RW ? ?Email sent to guardian Toula Moos at [place] with update.  Inquired if patient does need home health services do they have a preference of agency  ? ?Expected Discharge Plan: Group Home ?Barriers to Discharge: Continued Medical Work up ? ? ?Patient Goals and CMS Choice ?  ?CMS Medicare.gov Compare Post Acute Care list provided to:: Patient Represenative (must comment) ?Choice offered to / list presented to : [facility] POA / Guardian ? ?Expected Discharge Plan and Services ?Expected Discharge Plan: Group Home ?  ?Discharge Planning Services: CM Consult ?Post Acute Care Choice: Home Health ?Living arrangements for the past 2 months: Group Home ?                ?  ?  ?  ?  ?  ?  ?  ?  ?  ?  ? ?Prior Living Arrangements/Services ?Living arrangements for the past 2 months: Group Home ?Lives with:: Facility Resident ?Patient language and need for interpreter reviewed:: Yes ?Do you feel safe going back to the place where you live?: Yes      ?Need for Family Participation in Patient Care: Yes (Comment) ?Care giver support system in place?: Yes (comment) ?  ?Criminal Activity/Legal Involvement Pertinent to Current Situation/Hospitalization: No - Comment as needed ? ?Activities of Daily Living ?Home Assistive Devices/Equipment: None ?ADL Screening  (condition at time of admission) ?Patient's cognitive ability adequate to safely complete daily activities?: No ?Is the patient deaf or have difficulty hearing?: No ?Does the patient have difficulty seeing, even when wearing glasses/contacts?: No ?Does the patient have difficulty concentrating, remembering, or making decisions?: Yes ?Patient able to express need for assistance with ADLs?: No ?Does the patient have difficulty dressing or bathing?: No ?Independently performs ADLs?: Yes (appropriate for developmental age) ?Does the patient have difficulty walking or climbing stairs?: No ?Weakness of Legs: None ?Weakness of Arms/Hands: None ? ?Permission Sought/Granted ?  ?  ?   ?   ?   ?   ? ?Emotional Assessment ?  ?  ?  ?Orientation: : Oriented to Self ?Alcohol / Substance Use: Not Applicable ?Psych Involvement: No (comment) ? ?Admission diagnosis:  Delirium [R41.0] ?Encephalopathy [G93.40] ?Acute encephalopathy [G93.40] ?Acute conjunctivitis of right eye, unspecified acute conjunctivitis type [H10.31] ?Patient Active Problem List  ? Diagnosis Date Noted  ? Acute encephalopathy 09/18/2021  ? Acute conjunctivitis, right eye 09/18/2021  ? Seizure-like activity ([place])   ? Schizophrenia ([place]) 04/09/2021  ? AKI (acute kidney injury) ([place])   ? Rash   ? Tachycardia   ? Recurrent seizures ([place]) 08/19/2019  ? Seizure ([place]) 08/18/2019  ? Tardive dyskinesia 11/21/2015  ? Noncompliance 11/21/2015  ? HTN (hypertension) 10/10/2015  ? Hyperlipidemia 09/25/2015  ? Schizophrenia, undifferentiated ([place])   ?  Diabetes ([place]) 09/16/2015  ? Hypothyroid 09/16/2015  ? ?PCP:  Terrill Mohr, NP ?Pharmacy:   ?[facility] [place] [facility] ?[place] [address] ?Phone: [phone] Fax: [phone] ? ? ? ? ?Social Determinants of Health (SDOH) Interventions ?  ? ?Readmission Risk Interventions ?No flowsheet data found. ? ? ?

## 2021-09-20 DIAGNOSIS — H1031 Unspecified acute conjunctivitis, right eye: Secondary | ICD-10-CM | POA: Diagnosis not present

## 2021-09-20 DIAGNOSIS — F203 Undifferentiated schizophrenia: Secondary | ICD-10-CM | POA: Diagnosis not present

## 2021-09-20 DIAGNOSIS — E039 Hypothyroidism, unspecified: Secondary | ICD-10-CM | POA: Diagnosis not present

## 2021-09-20 DIAGNOSIS — G9340 Encephalopathy, unspecified: Secondary | ICD-10-CM | POA: Diagnosis not present

## 2021-09-20 LAB — GLUCOSE, CAPILLARY
Glucose-Capillary: 111 mg/dL — ABNORMAL HIGH (ref 70–99)
Glucose-Capillary: 106 mg/dL — ABNORMAL HIGH (ref 70–99)
Glucose-Capillary: 119 mg/dL — ABNORMAL HIGH (ref 70–99)
Glucose-Capillary: 137 mg/dL — ABNORMAL HIGH (ref 70–99)
Glucose-Capillary: 132 mg/dL — ABNORMAL HIGH (ref 70–99)
Glucose-Capillary: 125 mg/dL — ABNORMAL HIGH (ref 70–99)

## 2021-09-20 LAB — BASIC METABOLIC PANEL
Anion gap: 11 (ref 5–15)
BUN: 30 mg/dL — ABNORMAL HIGH (ref 8–23)
CO2: 30 mmol/L (ref 22–32)
Calcium: 8.6 mg/dL — ABNORMAL LOW (ref 8.9–10.3)
Chloride: 106 mmol/L (ref 98–111)
Creatinine, Ser: 1.03 mg/dL (ref 0.61–1.24)
GFR, Estimated: >60 mL/min (ref >60)
Glucose, Bld: 107 mg/dL — ABNORMAL HIGH (ref 70–99)
Potassium: 4.1 mmol/L (ref 3.5–5.1)
Sodium: 147 mmol/L — ABNORMAL HIGH (ref 135–145)

## 2021-09-20 LAB — MAGNESIUM: Magnesium: 2.1 mg/dL (ref 1.7–2.4)

## 2021-09-20 MED ORDER — BENZTROPINE MESYLATE 1 MG/ML IJ SOLN
1.0000 mg | Freq: Two times a day (BID) | INTRAMUSCULAR | Status: DC
  Administered 2021-09-20 – 2021-09-24 (×8): 1 mg via INTRAMUSCULAR
  Filled 2021-09-20 (×11): qty 1

## 2021-09-20 MED ORDER — LEVOTHYROXINE SODIUM 100 MCG/5ML IV SOLN
75.0000 ug | Freq: Every day | INTRAVENOUS | Status: DC
  Administered 2021-09-23 – 2021-10-10 (×17): 75 ug via INTRAVENOUS
  Filled 2021-09-20 (×18): qty 5

## 2021-09-20 MED ORDER — POLYVINYL ALCOHOL 1.4 % OP SOLN
1.0000 [drp] | OPHTHALMIC | Status: DC | PRN
  Filled 2021-09-20: qty 15

## 2021-09-20 MED ORDER — FOLIC ACID 5 MG/ML IJ SOLN
1.0000 mg | Freq: Every day | INTRAMUSCULAR | Status: DC
  Administered 2021-09-20 – 2021-10-12 (×20): 1 mg via INTRAVENOUS
  Filled 2021-09-20 (×23): qty 0.2

## 2021-09-20 NOTE — Progress Notes (Signed)
Physical Therapy Treatment ?Patient Details ?Name: [name] ?MRN: [id number] ?DOB: [birth year] ?Today's Date: 09/20/2021 ? ? ?History of Present Illness Pt is a 66 y.o. male who presents to the ED from his ALF by EMS due to AMS after being found somnolent and not responding to questions as typical. Currently admitted for acute encephalopathy likely due to his uncontrolled primary psychiatric disorder i.e. schizophrenia. PmHx: Hypothyroid, HLD, HTN, Seizure, Agitation, Schizophrenia. ? ?  ?PT Comments  ? ? Pt was asleep in long sitting with sitter present." He has been sleeping almost all day." Author was able to awake pt however he remained extremely lethargic and falls asleep several times during very limited session. Most likely lethargy/sleeping from medications. Author total assisted pt to EOB in hopes to awake pt long enough to participate. Unsuccessful. Acute PT will continue to follow and progress as able per current POC. Repositioned pt in bed with bed alarm set and sitter present at conclusion of session.  ?  ?Recommendations for follow up therapy are one component of a multi-disciplinary discharge planning process, led by the attending physician.  Recommendations may be updated based on patient status, additional functional criteria and insurance authorization. ? ?Follow Up Recommendations ? Other (comment) (pt will require 24/7 supervision/assistance at DC. unable to fully participate in session today due to lethargy.) ?  ?  ?Assistance Recommended at Discharge Frequent or constant Supervision/Assistance  ?Patient can return home with the following Two people to help with walking and/or transfers;Two people to help with bathing/dressing/bathroom;Assistance with cooking/housework;Assistance with feeding;Assist for transportation;Help with stairs or ramp for entrance;Direct supervision/assist for medications management;Direct supervision/assist for financial management ?  ?Equipment Recommendations ?  Rolling walker (2 wheels);BSC/3in1;Wheelchair (measurements PT);Wheelchair cushion (measurements PT);Hospital bed (All equipment recs are for current state...will continue to assess in future sessions.)  ?  ?   ?Precautions / Restrictions Precautions ?Precautions: Fall ?Restrictions ?Weight Bearing Restrictions: No  ?  ? ?Mobility ? Bed Mobility ?Overal bed mobility: Needs Assistance ?Bed Mobility: Supine to Sit, Sit to Supine ? Sit to supine: Total assist, HOB elevated ?  ?General bed mobility comments: pt required total assistanmce to achieve EOB short sit. Author was hoping pt would be more alert if place in short sit EOB. Pt remained unable to stay awake and unable to fully participate. Sternal rub provided at EOB with pt moaning for a few secounds prior to drifting back to sleep. No very appropriate for PT in current state. will continue efforts to progress as able per current POC. ?  ? ?Transfers ? General transfer comment: unsafe to attempt due to lethargy ?  ? ?  ?Balance Overall balance assessment: Needs assistance ?Sitting-balance support: Bilateral upper extremity supported, Feet supported ?Sitting balance-Leahy Scale: Poor ?Sitting balance - Comments: pt unable to maintain sitting balance. required constant min-mod assist to prevent posterior LOB ?  ?   ?Cognition Arousal/Alertness: Suspect due to medications, Lethargic ?  ?   ?General Comments: Pt was extremely lethargic. struggles to stay awake throughout even with max encouragement/occasional sternal rub/ and max vcing ?  ?  ? ?  ? ? ?PT Goals (current goals can now be found in the care plan section) Acute Rehab PT Goals ?Patient Stated Goal: none stated ?Progress towards PT goals: Not progressing toward goals - comment (PT session greatly limited by lethargy/ inability to stay awake.) ? ?  ?Frequency ? ? ? Min 2X/week ? ? ? ?  ?PT Plan Current plan remains appropriate  ? ? ?   ?  AM-PAC PT "6 Clicks" Mobility   ?Outcome Measure ? Help needed turning  from your back to your side while in a flat bed without using bedrails?: Total ?Help needed moving from lying on your back to sitting on the side of a flat bed without using bedrails?: Total ?Help needed moving to and from a bed to a chair (including a wheelchair)?: Total ?Help needed standing up from a chair using your arms (e.g., wheelchair or bedside chair)?: Total ?Help needed to walk in hospital room?: Total ?Help needed climbing 3-5 steps with a railing? : Total ?6 Click Score: 6 ? ?  ?End of Session Equipment Utilized During Treatment:  (sitter present throughout) ?Activity Tolerance: Patient tolerated treatment well ?Patient left: in bed;with call bell/phone within reach;with bed alarm set;with nursing/sitter in room ?Nurse Communication: Mobility status ?PT Visit Diagnosis: Unsteadiness on feet (R26.81);Other abnormalities of gait and mobility (R26.89);Ataxic gait (R26.0) ?  ? ? ?Time: [id number] ?PT Time Calculation (min) (ACUTE ONLY): 8 min ? ?Charges:  $Therapeutic Activity: 8-22 mins          ?          ? ?Julaine Fusi PTA ?09/20/21, 4:47 PM  ? ?
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
## 2021-09-20 NOTE — Progress Notes (Signed)
?   09/20/21 2225  ?Assess: MEWS Score  ?Temp 99.8 ?F (37.7 ?C)  ?BP (!) 164/102  ?Pulse Rate (!) 130  ?Resp 20  ?SpO2 (!) 88 %  ?O2 Device Room Air  ?Assess: MEWS Score  ?MEWS Temp 0  ?MEWS Systolic 0  ?MEWS Pulse 3  ?MEWS RR 0  ?MEWS LOC 0  ?MEWS Score 3  ?MEWS Score Color Yellow  ?Assess: if the MEWS score is Yellow or Red  ?Were vital signs taken at a resting state? Yes  ?Focused Assessment Change from prior assessment (see assessment flowsheet)  ?Does the patient meet 2 or more of the SIRS criteria? No  ?MEWS guidelines implemented *See Row Information* Yes  ?Treat  ?MEWS Interventions Administered prn meds/treatments  ?Pain Scale 0-10  ?Pain Score Asleep  ?Breathing 1  ?Negative Vocalization 0  ?Facial Expression 0  ?Body Language 0  ?Consolability 0  ?PAINAD Score 1  ?Take Vital Signs  ?Increase Vital Sign Frequency  Yellow: Q 2hr X 2 then Q 4hr X 2, if remains yellow, continue Q 4hrs  ?Escalate  ?MEWS: Escalate Yellow: discuss with charge nurse/RN and consider discussing with provider and RRT  ?Notify: Charge Nurse/RN  ?Name of Charge Nurse/RN Notified Lennon Alstrom RN  ?Date Charge Nurse/RN Notified 09/20/21  ?Time Charge Nurse/RN Notified 2225  ?Notify: Provider  ?Provider Name/Title Manuela Schwartz NP  ?Date Provider Notified 09/20/21  ?Time Provider Notified 2245  ?Notification Type Page  ?Notification Reason Change in status  ?Provider response No new orders  ?Date of Provider Response 09/20/21  ?Time of Provider Response 2255  ?Document  ?Patient Outcome Other (Comment) ?(Pt given another dose of labetalol IV and placed on oxygen at 3L/[place]. will cont to monitor the pt)  ?Assess: SIRS CRITERIA  ?SIRS Temperature  0  ?SIRS Pulse 1  ?SIRS Respirations  0  ?SIRS WBC 0  ?SIRS Score Sum  1  ? ?Pt  ?

## 2021-09-21 ENCOUNTER — Inpatient Hospital Stay: Payer: Medicare Other

## 2021-09-21 DIAGNOSIS — J690 Pneumonitis due to inhalation of food and vomit: Secondary | ICD-10-CM

## 2021-09-21 DIAGNOSIS — G9340 Encephalopathy, unspecified: Secondary | ICD-10-CM | POA: Diagnosis not present

## 2021-09-21 DIAGNOSIS — E039 Hypothyroidism, unspecified: Secondary | ICD-10-CM | POA: Diagnosis not present

## 2021-09-21 DIAGNOSIS — F203 Undifferentiated schizophrenia: Secondary | ICD-10-CM | POA: Diagnosis not present

## 2021-09-21 LAB — LACTIC ACID, PLASMA
Lactic Acid, Venous: 1.7 mmol/L (ref 0.5–1.9)
Lactic Acid, Venous: 1.8 mmol/L (ref 0.5–1.9)

## 2021-09-21 LAB — BASIC METABOLIC PANEL
Anion gap: 10 (ref 5–15)
BUN: 34 mg/dL — ABNORMAL HIGH (ref 8–23)
CO2: 28 mmol/L (ref 22–32)
Calcium: 8.5 mg/dL — ABNORMAL LOW (ref 8.9–10.3)
Chloride: 110 mmol/L (ref 98–111)
Creatinine, Ser: 1.07 mg/dL (ref 0.61–1.24)
GFR, Estimated: >60 mL/min (ref >60)
Glucose, Bld: 154 mg/dL — ABNORMAL HIGH (ref 70–99)
Potassium: 3.8 mmol/L (ref 3.5–5.1)
Sodium: 148 mmol/L — ABNORMAL HIGH (ref 135–145)

## 2021-09-21 LAB — CBC WITH DIFFERENTIAL/PLATELET
Abs Immature Granulocytes: 0.01 10*3/uL (ref 0.00–0.07)
Basophils Absolute: 0 10*3/uL (ref 0.0–0.1)
Basophils Relative: 0 %
Eosinophils Absolute: 0 10*3/uL (ref 0.0–0.5)
Eosinophils Relative: 0 %
HCT: 41.7 % (ref 39.0–52.0)
Hemoglobin: 13.7 g/dL (ref 13.0–17.0)
Immature Granulocytes: 0 %
Lymphocytes Relative: 10 %
Lymphs Abs: 0.5 10*3/uL — ABNORMAL LOW (ref 0.7–4.0)
MCH: 27.1 pg (ref 26.0–34.0)
MCHC: 32.9 g/dL (ref 30.0–36.0)
MCV: 82.4 fL (ref 80.0–100.0)
Monocytes Absolute: 1.2 10*3/uL — ABNORMAL HIGH (ref 0.1–1.0)
Monocytes Relative: 25 %
Neutro Abs: 3.1 10*3/uL (ref 1.7–7.7)
Neutrophils Relative %: 65 %
Platelets: 148 10*3/uL — ABNORMAL LOW (ref 150–400)
RBC: 5.06 MIL/uL (ref 4.22–5.81)
RDW: 14.7 % (ref 11.5–15.5)
WBC: 4.8 10*3/uL (ref 4.0–10.5)
nRBC: 0 % (ref 0.0–0.2)

## 2021-09-21 LAB — GLUCOSE, CAPILLARY
Glucose-Capillary: 128 mg/dL — ABNORMAL HIGH (ref 70–99)
Glucose-Capillary: 128 mg/dL — ABNORMAL HIGH (ref 70–99)
Glucose-Capillary: 139 mg/dL — ABNORMAL HIGH (ref 70–99)
Glucose-Capillary: 144 mg/dL — ABNORMAL HIGH (ref 70–99)

## 2021-09-21 LAB — SODIUM: Sodium: 147 mmol/L — ABNORMAL HIGH (ref 135–145)

## 2021-09-21 LAB — PROCALCITONIN: Procalcitonin: <0.1 ng/mL

## 2021-09-21 LAB — VALPROIC ACID LEVEL: Valproic Acid Lvl: 127 ug/mL — ABNORMAL HIGH (ref 50.0–100.0)

## 2021-09-21 MED ORDER — SODIUM CHLORIDE 0.9 % IV SOLN
3.0000 g | Freq: Four times a day (QID) | INTRAVENOUS | Status: AC
  Administered 2021-09-21 – 2021-09-26 (×20): 3 g via INTRAVENOUS
  Filled 2021-09-21 (×2): qty 8
  Filled 2021-09-21 (×5): qty 3
  Filled 2021-09-21 (×2): qty 8
  Filled 2021-09-21: qty 3
  Filled 2021-09-21 (×2): qty 8
  Filled 2021-09-21: qty 3
  Filled 2021-09-21 (×2): qty 8
  Filled 2021-09-21 (×5): qty 3

## 2021-09-21 MED ORDER — DEXTROSE-NACL 5-0.45 % IV SOLN: INTRAVENOUS | Status: DC

## 2021-09-21 MED ORDER — SODIUM CHLORIDE 0.45 % IV BOLUS
500.0000 mL | Freq: Once | INTRAVENOUS | Status: AC
  Administered 2021-09-21: 500 mL via INTRAVENOUS

## 2021-09-21 MED ORDER — VALPROATE SODIUM 100 MG/ML IV SOLN
500.0000 mg | Freq: Two times a day (BID) | INTRAVENOUS | Status: DC
  Administered 2021-09-22 – 2021-10-03 (×23): 500 mg via INTRAVENOUS
  Filled 2021-09-21 (×25): qty 5

## 2021-09-21 MED ORDER — ACETAMINOPHEN 10 MG/ML IV SOLN
1000.0000 mg | Freq: Once | INTRAVENOUS | Status: AC
  Administered 2021-09-21: 1000 mg via INTRAVENOUS
  Filled 2021-09-21: qty 100

## 2021-09-21 MED ORDER — DEXTROSE 5 % IV SOLN: INTRAVENOUS | Status: DC

## 2021-09-21 NOTE — Progress Notes (Signed)
?   09/21/21 1922  ?Assess: MEWS Score  ?Temp 98.7 ?F (37.1 ?C)  ?BP (!) 153/87  ?Pulse Rate (!) 120  ?SpO2 96 %  ?O2 Device Nasal Cannula  ?Assess: MEWS Score  ?MEWS Temp 0  ?MEWS Systolic 0  ?MEWS Pulse 2  ?MEWS RR 0  ?MEWS LOC 1  ?MEWS Score 3  ?MEWS Score Color Yellow  ?Assess: if the MEWS score is Yellow or Red  ?Were vital signs taken at a resting state? Yes  ?Focused Assessment No change from prior assessment  ?Does the patient meet 2 or more of the SIRS criteria? No  ?Does the patient have a confirmed or suspected source of infection? No  ?MEWS guidelines implemented *See Row Information* No, previously yellow, continue vital signs every 4 hours  ?Treat  ?Pain Scale PAINAD  ?Pain Score Asleep  ?Breathing 1  ?Negative Vocalization 0  ?Facial Expression 0  ?Body Language 0  ?Consolability 0  ?PAINAD Score 1  ?Assess: SIRS CRITERIA  ?SIRS Temperature  0  ?SIRS Pulse 1  ?SIRS Respirations  0  ?SIRS WBC 0  ?SIRS Score Sum  1  ? ? ?

## 2021-09-21 NOTE — Progress Notes (Signed)
PT Cancellation Note ? ?Patient Details ?Name: [name] ?MRN: [id number] ?DOB: 1955-12-29 ? ? ?Cancelled Treatment:     PT attempt. Per RN staff, pt remains extremely lethargic. Not currently appropriate. RN to inform Pryor Curia if/when pt becomes more alert and appropriate to participate with PT.  ? ? ?Willette Pa ?09/21/2021, 10:32 AM ?
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
## 2021-09-21 NOTE — Plan of Care (Signed)
?  Problem: Clinical Measurements: ?Goal: Ability to maintain clinical measurements within normal limits will improve ?Outcome: Progressing ?Goal: Will remain free from infection ?Outcome: Progressing ?Goal: Diagnostic test results will improve ?Outcome: Progressing ?Goal: Respiratory complications will improve ?Outcome: Progressing ?Goal: Cardiovascular complication will be avoided ?Outcome: Progressing ?  ?Pt is very lethargic and very hard to arouse. Pt lung sounds congested, on oxygen at 3lpm/[place] with oxygen saturation at above 90%s. Oncall provider aware. See previous orders. V/S now stable except HR in 100-110s.  ?

## 2021-09-21 NOTE — Consult Note (Signed)
Pharmacy Antibiotic Note ? ?[name] is a 66 y.o. male admitted on 09/17/2021 with  aspiration pneumonia .  Pharmacy has been consulted for Unasyn dosing. ? ?Plan: ?Unasyn 3gm every 6 hours for 5 days for aspiration pneumonia ? ?Height: 6\' 3"  (190.5 cm) ?Weight: 97.1 kg (214 lb) ?IBW/kg (Calculated) : 84.5 ? ?Temp (24hrs), Avg:99.7 ?F (37.6 ?C), Min:98.4 ?F (36.9 ?C), Max:101.8 ?F (38.8 ?C) ? ?Recent Labs  ?Lab 09/17/21 ?1813 09/18/21 ?[id number] 09/19/21 ?[id number] 09/20/21 ?0422 09/21/21 ?0101 09/21/21 ?0103 09/21/21 ?0206 09/21/21 ?0402  ?WBC 4.2 3.9* 3.9*  --   --   --  4.8  --   ?CREATININE 1.03 0.99 1.03 1.03 1.07  --   --   --   ?LATICACIDVEN  --   --   --   --   --  1.7  --  1.8  ?  ?Estimated Creatinine Clearance: 81.2 mL/min (by C-G formula based on SCr of 1.07 mg/dL).   ? ?Allergies  ?Allergen Reactions  ? Depakote [Valproic Acid] Other (See Comments)  ?  Reaction:  Hyperammonemia   ? Lithium Other (See Comments)  ?  Pt has a prior history of Lithium toxicity.     ? ? ? ?Microbiology results: ?3/14 MRSA PCR: negative ? ?Thank you for allowing pharmacy to be a part of this patient?s care. ? ?Darrick Penna, PharmD, MS PGPM ?Clinical Pharmacist ?09/21/2021 ?2:46 PM ? ? ?

## 2021-09-21 NOTE — Progress Notes (Signed)
PT Cancellation Note ? ?Patient Details ?Name: [name] ?MRN: [id number] ?DOB: 1955/11/25 ? ? ?Cancelled Treatment:     Pt continues to be lethargic and not appropriate for PT. Pt 's mental status makes pt unsafe to participate in PT at this time.we will continue to follow and return when pt is more appropriate to participate.   ? ? ?Rushie Chestnut ?09/21/2021, 4:17 PM ?

## 2021-09-21 NOTE — Assessment & Plan Note (Addendum)
Acute respiratory failure with hypoxia due to pneumonia- resolved ?Now breathing comfortably on room air.  Initially felt to be high aspiration risk due to psychosis.  This too has improved significantly and patient no longer requiring TPN.  See below. ? ? ?

## 2021-09-21 NOTE — Progress Notes (Addendum)
? ?  PATIENTTreston [name] TXM:[id number] DOB: 1956-03-31  ? ?Subjective: nurse reports patient is lethargic and now needs oxygen 3 liters per nasal cannula to maintain oxygen sats above 90%, congested productive cough, fever 101.8, tachycardia 130 and hypertensive.  He was just given labetalol 5 mg IV ?Patient was somnolent on arrival to ED and then became agitated requiring multiple antipsychotic meds ? ?Objective: ?Vitals:  ? 09/21/21 0009 09/21/21 0108  ?BP: (!) 174/108 (!) 157/89  ?Pulse: (!) 117 (!) 109  ?Resp: 20 (!) 24  ?Temp: (!) 101.8 ?F (38.8 ?C) 99.8 ?F (37.7 ?C)  ?SpO2: 97% 95%  ? ?Chest xray without active disease. Procal and lactic is normal. Sodium elevated.  ? ? ? ?Plan:  ?SIRS - not sepsis, no infection source. Fever likely atelectasis secondary to somnolence. Cannot rule out early aspiration as he is high risk with EPS. Tylenol for fever. Still awaiting urine specimen to be sent to lab for U/A ordered on admission ? ?Acute Resp failure with hypoxia - supplemental oxygen - wean as tolerated. Keep head of bead up and aspiration precautions ? ?Hypernatremia - started on low volume D5W - recheck sodium 0500 ?  ?Tali Cleaves L. Jon Billings NP ?[facility] ?

## 2021-09-21 NOTE — Progress Notes (Signed)
?   09/21/21 0009  ?Assess: MEWS Score  ?Temp (!) 101.8 ?F (38.8 ?C)  ?BP (!) 174/108  ?Pulse Rate (!) 117  ?Resp 20  ?Level of Consciousness Responds to Voice  ?SpO2 97 %  ?O2 Device Nasal Cannula  ?O2 Flow Rate (L/min) 3 L/min  ?Assess: MEWS Score  ?MEWS Temp 2  ?MEWS Systolic 0  ?MEWS Pulse 2  ?MEWS RR 0  ?MEWS LOC 1  ?MEWS Score 5  ?MEWS Score Color Red  ?Assess: if the MEWS score is Yellow or Red  ?Were vital signs taken at a resting state? Yes  ?Focused Assessment Change from prior assessment (see assessment flowsheet)  ?Does the patient meet 2 or more of the SIRS criteria? Yes  ?Does the patient have a confirmed or suspected source of infection? No  ?MEWS guidelines implemented *See Row Information* Yes  ?Treat  ?MEWS Interventions  ?(oncall proider informed,awaiting for orders)  ?Pain Scale 0-10  ?Pain Score Asleep  ?Take Vital Signs  ?Increase Vital Sign Frequency  Red: Q 1hr X 4 then Q 4hr X 4, if remains red, continue Q 4hrs  ?Escalate  ?MEWS: Escalate Red: discuss with charge nurse/RN and provider, consider discussing with RRT  ?Notify: Charge Nurse/RN  ?Name of Charge Nurse/RN Notified Phoebe Sharps RN  ?Date Charge Nurse/RN Notified 09/21/21  ?Time Charge Nurse/RN Notified 0009  ?Notify: Provider  ?Provider Name/Title Sharion Settler RN  ?Date Provider Notified 09/21/21  ?Time Provider Notified [phone]  ?Notification Type Page  ?Notification Reason Change in status  ?Provider response Other (Comment) ?(awaiting for orders)  ?Date of Provider Response 09/21/21  ?Time of Provider Response 0100  ?Assess: SIRS CRITERIA  ?SIRS Temperature  1  ?SIRS Pulse 1  ?SIRS Respirations  0  ?SIRS WBC 0  ?SIRS Score Sum  2  ? ?Oncall provider notified about pt V/S - bp, hr and temp elevated. Labetalol IV given. Acetaminophen IV given. CXR done and labs taken. Will cont to monitor v/s for red mews.   ?

## 2021-09-21 NOTE — Consult Note (Signed)
[facility]  ? ?Reason for Consult: Consult for 66 year old man following up on his admission to the medical floor.  Patient with schizophrenia. ?Referring Physician:  Rito EhrlichKrishnan ?Patient Identification: [name] ?MRN:  [id number] ?Principal Diagnosis: Acute encephalopathy ?Diagnosis:  Principal Problem: ?  Acute encephalopathy ?Active Problems: ?  Diabetes (HCC) ?  Hypothyroid ?  Hyperlipidemia ?  HTN (hypertension) ?  Noncompliance ?  Seizure (HCC) ?  Schizophrenia (HCC) ?  Acute conjunctivitis, right eye ?  Aspiration pneumonia (HCC) ? ? ?Total Time spent with patient: 30 minutes ? ?Subjective:   ?[name] is a 66 y.o. male patient admitted with patient currently unresponsive. ? ?HPI: This is a follow-up note.  Patient very familiar to me from previous psychiatric encounters.  66 year old man with schizophrenia who has had multiple visits to the emergency room over the years.  Typically with altered mental status characterized by refusal to eat sometimes by catatonia almost but usually not nearly as bad in terms of unresponsiveness is what we are seeing today.  Came into the hospital with confusion and decreased mental status and was admitted to the medical service.  Spoke with nursing today.  Reported that patient has been unresponsive not eating not interacting.  When I came in and found patient in the room breathing comfortably but unresponsive verbally.  When I shouted his name loudly and touched him on the shoulder his eyes flickered but that was as much response as I got out of him.  He is currently being treated with the Haldol decanoate dose that he is used to, olanzapine at doses similar to what he is used to and anticonvulsant medicines. ? ?Past Psychiatric History: Patient has a very long history of schizophrenia multiple hospitalizations multiple ER visits.  In the last few years we have tended not to admit him to the psychiatric ward because he is very difficult to manage  initially but usually clears up in a fairly short time once medication is forced.  No known history of suicide attempts or significant violence.  Tends to be very hyper religious when he is psychotic ? ?Risk to Self:   ?Risk to Others:   ?Prior Inpatient Therapy:   ?Prior Outpatient Therapy:   ? ?Past Medical History:  ?Past Medical History:  ?Diagnosis Date  ? Diabetes mellitus without complication (HCC)   ? HTN (hypertension)   ? Hyperammonemia (HCC) While on Depakote  ? Hyperlipemia   ? Lithium toxicity   ? Neutropenia (HCC)   ? Schizophrenia (HCC)   ? Seizure (HCC) While toxic on Depakote  ? Sickle cell trait (HCC)   ? Thyroid disease   ? TIA (transient ischemic attack) in 2009  ? History reviewed. No pertinent surgical history. ?Family History: History reviewed. No pertinent family history. ?Family Psychiatric  History: None reported ?Social History:  ?Social History  ? ?Substance and Sexual Activity  ?Alcohol Use No  ?   ?Social History  ? ?Substance and Sexual Activity  ?Drug Use Not Currently  ?  ?Social History  ? ?Socioeconomic History  ? Marital status: Single  ?  Spouse name: Not on file  ? Number of children: Not on file  ? Years of education: Not on file  ? Highest education level: Not on file  ?Occupational History  ? Not on file  ?Tobacco Use  ? Smoking status: Never  ? Smokeless tobacco: Never  ?Vaping Use  ? Vaping Use: Never used  ?Substance and Sexual Activity  ? Alcohol use:  No  ? Drug use: Not Currently  ? Sexual activity: Not Currently  ?Other Topics Concern  ? Not on file  ?Social History Narrative  ? Not on file  ? ?Social Determinants of Health  ? ?Financial Resource Strain: Not on file  ?Food Insecurity: Not on file  ?Transportation Needs: Not on file  ?Physical Activity: Not on file  ?Stress: Not on file  ?Social Connections: Not on file  ? ?Additional Social History: ?  ? ?Allergies:   ?Allergies  ?Allergen Reactions  ? Depakote [Valproic Acid] Other (See Comments)  ?  Reaction:   Hyperammonemia   ? Lithium Other (See Comments)  ?  Pt has a prior history of Lithium toxicity.     ? ? ?Labs:  ?Results for orders placed or performed during the hospital encounter of 09/17/21 (from the past 48 hour(s))  ?Glucose, capillary     Status: Abnormal  ? Collection Time: 09/19/21  4:06 PM  ?Result Value Ref Range  ? Glucose-Capillary 115 (H) 70 - 99 mg/dL  ?  Comment: Glucose reference range applies only to samples taken after fasting for at least 8 hours.  ?Glucose, capillary     Status: None  ? Collection Time: 09/19/21  9:40 PM  ?Result Value Ref Range  ? Glucose-Capillary 94 70 - 99 mg/dL  ?  Comment: Glucose reference range applies only to samples taken after fasting for at least 8 hours.  ?Glucose, capillary     Status: Abnormal  ? Collection Time: 09/20/21  1:17 AM  ?Result Value Ref Range  ? Glucose-Capillary 111 (H) 70 - 99 mg/dL  ?  Comment: Glucose reference range applies only to samples taken after fasting for at least 8 hours.  ? Comment 1 Notify RN   ?Glucose, capillary     Status: Abnormal  ? Collection Time: 09/20/21  4:20 AM  ?Result Value Ref Range  ? Glucose-Capillary 106 (H) 70 - 99 mg/dL  ?  Comment: Glucose reference range applies only to samples taken after fasting for at least 8 hours.  ? Comment 1 Notify RN   ?Basic metabolic panel     Status: Abnormal  ? Collection Time: 09/20/21  4:22 AM  ?Result Value Ref Range  ? Sodium 147 (H) 135 - 145 mmol/L  ? Potassium 4.1 3.5 - 5.1 mmol/L  ? Chloride 106 98 - 111 mmol/L  ? CO2 30 22 - 32 mmol/L  ? Glucose, Bld 107 (H) 70 - 99 mg/dL  ?  Comment: Glucose reference range applies only to samples taken after fasting for at least 8 hours.  ? BUN 30 (H) 8 - 23 mg/dL  ? Creatinine, Ser 1.03 0.61 - 1.24 mg/dL  ? Calcium 8.6 (L) 8.9 - 10.3 mg/dL  ? GFR, Estimated >60 >60 mL/min  ?  Comment: (NOTE) ?Calculated using the CKD-EPI Creatinine Equation (2021) ?  ? Anion gap 11 5 - 15  ?  Comment: Performed at [facility], [address] [place], [place] [address]  ?Magnesium     Status: None  ? Collection Time: 09/20/21  4:22 AM  ?Result Value Ref Range  ? Magnesium 2.1 1.7 - 2.4 mg/dL  ?  Comment: Performed at [facility], [address], [place], [place] [address]  ?Glucose, capillary     Status: Abnormal  ? Collection Time: 09/20/21  8:10 AM  ?Result Value Ref Range  ? Glucose-Capillary 119 (H) 70 - 99 mg/dL  ?  Comment: Glucose reference range  applies only to samples taken after fasting for at least 8 hours.  ?Glucose, capillary     Status: Abnormal  ? Collection Time: 09/20/21 11:39 AM  ?Result Value Ref Range  ? Glucose-Capillary 137 (H) 70 - 99 mg/dL  ?  Comment: Glucose reference range applies only to samples taken after fasting for at least 8 hours.  ?Glucose, capillary     Status: Abnormal  ? Collection Time: 09/20/21  5:12 PM  ?Result Value Ref Range  ? Glucose-Capillary 132 (H) 70 - 99 mg/dL  ?  Comment: Glucose reference range applies only to samples taken after fasting for at least 8 hours.  ?Glucose, capillary     Status: Abnormal  ? Collection Time: 09/20/21  7:39 PM  ?Result Value Ref Range  ? Glucose-Capillary 125 (H) 70 - 99 mg/dL  ?  Comment: Glucose reference range applies only to samples taken after fasting for at least 8 hours.  ?Glucose, capillary     Status: Abnormal  ? Collection Time: 09/21/21 12:02 AM  ?Result Value Ref Range  ? Glucose-Capillary 128 (H) 70 - 99 mg/dL  ?  Comment: Glucose reference range applies only to samples taken after fasting for at least 8 hours.  ?Basic metabolic panel     Status: Abnormal  ? Collection Time: 09/21/21  1:01 AM  ?Result Value Ref Range  ? Sodium 148 (H) 135 - 145 mmol/L  ? Potassium 3.8 3.5 - 5.1 mmol/L  ? Chloride 110 98 - 111 mmol/L  ? CO2 28 22 - 32 mmol/L  ? Glucose, Bld 154 (H) 70 - 99 mg/dL  ?  Comment: Glucose reference range applies only to samples taken after fasting for at least 8 hours.  ? BUN 34 (H) 8 - 23 mg/dL  ? Creatinine, Ser 1.07 0.61 - 1.24 mg/dL  ?  Calcium 8.5 (L) 8.9 - 10.3 mg/dL  ? GFR, Estimated >60 >60 mL/min  ?  Comment: (NOTE) ?Calculated using the CKD-EPI Creatinine Equation (2021) ?  ? Anion gap 10 5 - 15  ?  Comment: Performed at [facility]

## 2021-09-22 ENCOUNTER — Inpatient Hospital Stay: Payer: Medicare Other

## 2021-09-22 DIAGNOSIS — G9340 Encephalopathy, unspecified: Secondary | ICD-10-CM | POA: Diagnosis not present

## 2021-09-22 DIAGNOSIS — E870 Hyperosmolality and hypernatremia: Secondary | ICD-10-CM | POA: Diagnosis not present

## 2021-09-22 DIAGNOSIS — J690 Pneumonitis due to inhalation of food and vomit: Secondary | ICD-10-CM | POA: Diagnosis not present

## 2021-09-22 DIAGNOSIS — E039 Hypothyroidism, unspecified: Secondary | ICD-10-CM | POA: Diagnosis not present

## 2021-09-22 LAB — BLOOD GAS, ARTERIAL
Acid-Base Excess: 6.4 mmol/L — ABNORMAL HIGH (ref 0.0–2.0)
Bicarbonate: 33.5 mmol/L — ABNORMAL HIGH (ref 20.0–28.0)
FIO2: 28 %
O2 Saturation: 98.8 %
Patient temperature: 37
RATE: 2 resp/min
pCO2 arterial: 58 mmHg — ABNORMAL HIGH (ref 32–48)
pH, Arterial: 7.37 (ref 7.35–7.45)
pO2, Arterial: 88 mmHg (ref 83–108)

## 2021-09-22 LAB — CBC
HCT: 40.9 % (ref 39.0–52.0)
Hemoglobin: 13 g/dL (ref 13.0–17.0)
MCH: 26.6 pg (ref 26.0–34.0)
MCHC: 31.8 g/dL (ref 30.0–36.0)
MCV: 83.8 fL (ref 80.0–100.0)
Platelets: 117 10*3/uL — ABNORMAL LOW (ref 150–400)
RBC: 4.88 MIL/uL (ref 4.22–5.81)
RDW: 15.1 % (ref 11.5–15.5)
WBC: 3.8 10*3/uL — ABNORMAL LOW (ref 4.0–10.5)
nRBC: 0 % (ref 0.0–0.2)

## 2021-09-22 LAB — BASIC METABOLIC PANEL
Anion gap: 7 (ref 5–15)
BUN: 37 mg/dL — ABNORMAL HIGH (ref 8–23)
CO2: 31 mmol/L (ref 22–32)
Calcium: 8.2 mg/dL — ABNORMAL LOW (ref 8.9–10.3)
Chloride: 113 mmol/L — ABNORMAL HIGH (ref 98–111)
Creatinine, Ser: 1.03 mg/dL (ref 0.61–1.24)
GFR, Estimated: >60 mL/min (ref >60)
Glucose, Bld: 133 mg/dL — ABNORMAL HIGH (ref 70–99)
Potassium: 3.6 mmol/L (ref 3.5–5.1)
Sodium: 151 mmol/L — ABNORMAL HIGH (ref 135–145)

## 2021-09-22 LAB — GLUCOSE, CAPILLARY
Glucose-Capillary: 130 mg/dL — ABNORMAL HIGH (ref 70–99)
Glucose-Capillary: 133 mg/dL — ABNORMAL HIGH (ref 70–99)
Glucose-Capillary: 141 mg/dL — ABNORMAL HIGH (ref 70–99)
Glucose-Capillary: 142 mg/dL — ABNORMAL HIGH (ref 70–99)
Glucose-Capillary: 118 mg/dL — ABNORMAL HIGH (ref 70–99)
Glucose-Capillary: 118 mg/dL — ABNORMAL HIGH (ref 70–99)
Glucose-Capillary: 142 mg/dL — ABNORMAL HIGH (ref 70–99)
Glucose-Capillary: 117 mg/dL — ABNORMAL HIGH (ref 70–99)

## 2021-09-22 LAB — AMMONIA: Ammonia: 47 umol/L — ABNORMAL HIGH (ref 9–35)

## 2021-09-22 LAB — VALPROIC ACID LEVEL: Valproic Acid Lvl: 64 ug/mL (ref 50.0–100.0)

## 2021-09-22 MED ORDER — POTASSIUM CL IN DEXTROSE 5% 20 MEQ/L IV SOLN
20.0000 meq | INTRAVENOUS | Status: DC
  Administered 2021-09-22 – 2021-09-29 (×13): 20 meq via INTRAVENOUS
  Filled 2021-09-22 (×23): qty 1000

## 2021-09-22 NOTE — Assessment & Plan Note (Addendum)
Due to lack of p.o. intake.  Resolved with D5 W infusion.  Was on TPN, not taking p.o. ? ?

## 2021-09-22 NOTE — Progress Notes (Signed)
Spoke with Corene Cornea in Pharmacy re: trying to administer scheduled Keppra, 250 mg in 102.5 ml, and Alaris pump giving warning possible overdose. Corene Cornea states this is a very low dose and not a correct overdose warning on Alaris. Stated OK to give the [place]. ?
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
## 2021-09-23 DIAGNOSIS — G9340 Encephalopathy, unspecified: Secondary | ICD-10-CM | POA: Diagnosis not present

## 2021-09-23 DIAGNOSIS — I10 Essential (primary) hypertension: Secondary | ICD-10-CM

## 2021-09-23 DIAGNOSIS — J690 Pneumonitis due to inhalation of food and vomit: Secondary | ICD-10-CM | POA: Diagnosis not present

## 2021-09-23 LAB — CBC
HCT: 38.1 % — ABNORMAL LOW (ref 39.0–52.0)
Hemoglobin: 11.8 g/dL — ABNORMAL LOW (ref 13.0–17.0)
MCH: 26.5 pg (ref 26.0–34.0)
MCHC: 31 g/dL (ref 30.0–36.0)
MCV: 85.4 fL (ref 80.0–100.0)
Platelets: 122 10*3/uL — ABNORMAL LOW (ref 150–400)
RBC: 4.46 MIL/uL (ref 4.22–5.81)
RDW: 14.9 % (ref 11.5–15.5)
WBC: 4.1 10*3/uL (ref 4.0–10.5)
nRBC: 0 % (ref 0.0–0.2)

## 2021-09-23 LAB — BASIC METABOLIC PANEL
Anion gap: 5 (ref 5–15)
BUN: 31 mg/dL — ABNORMAL HIGH (ref 8–23)
CO2: 32 mmol/L (ref 22–32)
Calcium: 8 mg/dL — ABNORMAL LOW (ref 8.9–10.3)
Chloride: 111 mmol/L (ref 98–111)
Creatinine, Ser: 0.95 mg/dL (ref 0.61–1.24)
GFR, Estimated: >60 mL/min (ref >60)
Glucose, Bld: 123 mg/dL — ABNORMAL HIGH (ref 70–99)
Potassium: 3.7 mmol/L (ref 3.5–5.1)
Sodium: 148 mmol/L — ABNORMAL HIGH (ref 135–145)

## 2021-09-23 LAB — GLUCOSE, CAPILLARY
Glucose-Capillary: 100 mg/dL — ABNORMAL HIGH (ref 70–99)
Glucose-Capillary: 121 mg/dL — ABNORMAL HIGH (ref 70–99)
Glucose-Capillary: 122 mg/dL — ABNORMAL HIGH (ref 70–99)
Glucose-Capillary: 123 mg/dL — ABNORMAL HIGH (ref 70–99)
Glucose-Capillary: 128 mg/dL — ABNORMAL HIGH (ref 70–99)
Glucose-Capillary: 136 mg/dL — ABNORMAL HIGH (ref 70–99)
Glucose-Capillary: 189 mg/dL — ABNORMAL HIGH (ref 70–99)

## 2021-09-23 LAB — VALPROIC ACID LEVEL: Valproic Acid Lvl: 65 ug/mL (ref 50.0–100.0)
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
## 2021-09-23 NOTE — Plan of Care (Signed)
?  Problem: Clinical Measurements: ?Goal: Will remain free from infection ?Outcome: Progressing ?  ?Problem: Clinical Measurements: ?Goal: Diagnostic test results will improve ?Outcome: Progressing ?  ?

## 2021-09-24 ENCOUNTER — Inpatient Hospital Stay: Payer: Medicare Other

## 2021-09-24 DIAGNOSIS — G9340 Encephalopathy, unspecified: Secondary | ICD-10-CM | POA: Diagnosis not present

## 2021-09-24 DIAGNOSIS — I10 Essential (primary) hypertension: Secondary | ICD-10-CM | POA: Diagnosis not present

## 2021-09-24 DIAGNOSIS — J690 Pneumonitis due to inhalation of food and vomit: Secondary | ICD-10-CM | POA: Diagnosis not present

## 2021-09-24 LAB — BASIC METABOLIC PANEL
Anion gap: 4 — ABNORMAL LOW (ref 5–15)
BUN: 22 mg/dL (ref 8–23)
CO2: 34 mmol/L — ABNORMAL HIGH (ref 22–32)
Calcium: 7.9 mg/dL — ABNORMAL LOW (ref 8.9–10.3)
Chloride: 108 mmol/L (ref 98–111)
Creatinine, Ser: 0.92 mg/dL (ref 0.61–1.24)
GFR, Estimated: >60 mL/min (ref >60)
Glucose, Bld: 117 mg/dL — ABNORMAL HIGH (ref 70–99)
Potassium: 4.3 mmol/L (ref 3.5–5.1)
Sodium: 146 mmol/L — ABNORMAL HIGH (ref 135–145)

## 2021-09-24 LAB — CBC
HCT: 39.9 % (ref 39.0–52.0)
Hemoglobin: 12.2 g/dL — ABNORMAL LOW (ref 13.0–17.0)
MCH: 26.8 pg (ref 26.0–34.0)
MCHC: 30.6 g/dL (ref 30.0–36.0)
MCV: 87.7 fL (ref 80.0–100.0)
Platelets: 152 10*3/uL (ref 150–400)
RBC: 4.55 MIL/uL (ref 4.22–5.81)
RDW: 14.6 % (ref 11.5–15.5)
WBC: 4.7 10*3/uL (ref 4.0–10.5)
nRBC: 0 % (ref 0.0–0.2)

## 2021-09-24 LAB — AMMONIA: Ammonia: 29 umol/L (ref 9–35)

## 2021-09-24 LAB — GLUCOSE, CAPILLARY
Glucose-Capillary: 144 mg/dL — ABNORMAL HIGH (ref 70–99)
Glucose-Capillary: 123 mg/dL — ABNORMAL HIGH (ref 70–99)
Glucose-Capillary: 100 mg/dL — ABNORMAL HIGH (ref 70–99)
Glucose-Capillary: 102 mg/dL — ABNORMAL HIGH (ref 70–99)

## 2021-09-24 LAB — CK: Total CK: 256 U/L (ref 49–397)

## 2021-09-24 MED ORDER — LORAZEPAM 2 MG/ML IJ SOLN
2.0000 mg | INTRAMUSCULAR | Status: DC | PRN
  Administered 2021-09-24 – 2021-10-05 (×9): 2 mg via INTRAVENOUS
  Filled 2021-09-24 (×9): qty 1

## 2021-09-24 MED ORDER — GADOBUTROL 1 MMOL/ML IV SOLN
9.0000 mL | Freq: Once | INTRAVENOUS | Status: AC | PRN
  Administered 2021-09-24: 9 mL via INTRAVENOUS

## 2021-09-24 NOTE — Progress Notes (Addendum)
PT Cancellation Note ? ?Patient Details ?Name: [name] ?MRN: [id number] ?DOB: [birth year] ? ? ?Cancelled Treatment:    Reason Eval/Treat Not Completed: Other (comment) 3 PT attempts made for PT treatment; Pt increased lethargy and unable to participate actively for sessions. PT to sign off, please re-consult when patient is medically appropriate to participate with therapy services.  ? ? ?Jonnie Kind, SPT ?09/24/2021, 11:48 AM ?

## 2021-09-24 NOTE — Progress Notes (Signed)
Notified Dr. Rito Ehrlich of slowly declinining blood sugar thorughout the day. No orders received, will continue to monitor and notify night nurse. ?

## 2021-09-24 NOTE — Progress Notes (Signed)
Pt starting to wake up. Sitting up suddenly, using all 4 extremeties to try to get out of bed but too weak yet to actually get out of bed. Then suddenly falls back asleep soundly. Contacted Dr. Toni Amend to see what would be the best medication to give him this evening and overnight and he recommends the IV Ativan. ?

## 2021-09-24 NOTE — TOC Progression Note (Signed)
Transition of Care (TOC) - Progression Note  ? ? ?Patient Details  ?Name: [name] ?MRN: [id number] ?Date of Birth: 1956/05/21 ? ?Transition of Care (TOC) CM/SW Contact  ?Chapman Fitch, RN ?Phone Number: ?09/24/2021, 2:03 PM ? ?Clinical Narrative:    ?PT has currently signed off due to patient's mental status and lethargy . ? ?Neuro consult pending ? ?Per Merry Proud at group home patient's mental status will need to be improved and patient will need to be able to ambulate with a walker in order for him to return    ? ? ?Expected Discharge Plan: Group Home ?Barriers to Discharge: Continued Medical Work up ? ?Expected Discharge Plan and Services ?Expected Discharge Plan: Group Home ?  ?Discharge Planning Services: CM Consult ?Post Acute Care Choice: Home Health ?Living arrangements for the past 2 months: Group Home ?                ?  ?  ?  ?  ?  ?  ?  ?  ?  ?  ? ? ?Social Determinants of Health (SDOH) Interventions ?  ? ?Readmission Risk Interventions ?No flowsheet data found. ? ?
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
## 2021-09-25 DIAGNOSIS — E43 Unspecified severe protein-calorie malnutrition: Secondary | ICD-10-CM | POA: Diagnosis present

## 2021-09-25 DIAGNOSIS — E639 Nutritional deficiency, unspecified: Secondary | ICD-10-CM | POA: Insufficient documentation

## 2021-09-25 DIAGNOSIS — G9340 Encephalopathy, unspecified: Secondary | ICD-10-CM | POA: Diagnosis not present

## 2021-09-25 DIAGNOSIS — I10 Essential (primary) hypertension: Secondary | ICD-10-CM | POA: Diagnosis not present

## 2021-09-25 DIAGNOSIS — J690 Pneumonitis due to inhalation of food and vomit: Secondary | ICD-10-CM | POA: Diagnosis not present

## 2021-09-25 LAB — CBC
HCT: 39.8 % (ref 39.0–52.0)
Hemoglobin: 12.5 g/dL — ABNORMAL LOW (ref 13.0–17.0)
MCH: 26.8 pg (ref 26.0–34.0)
MCHC: 31.4 g/dL (ref 30.0–36.0)
MCV: 85.4 fL (ref 80.0–100.0)
Platelets: 176 10*3/uL (ref 150–400)
RBC: 4.66 MIL/uL (ref 4.22–5.81)
RDW: 14.2 % (ref 11.5–15.5)
WBC: 5.3 10*3/uL (ref 4.0–10.5)
nRBC: 0 % (ref 0.0–0.2)

## 2021-09-25 LAB — BASIC METABOLIC PANEL
Anion gap: 5 (ref 5–15)
BUN: 16 mg/dL (ref 8–23)
CO2: 34 mmol/L — ABNORMAL HIGH (ref 22–32)
Calcium: 8.1 mg/dL — ABNORMAL LOW (ref 8.9–10.3)
Chloride: 103 mmol/L (ref 98–111)
Creatinine, Ser: 0.89 mg/dL (ref 0.61–1.24)
GFR, Estimated: >60 mL/min (ref >60)
Glucose, Bld: 123 mg/dL — ABNORMAL HIGH (ref 70–99)
Potassium: 3.8 mmol/L (ref 3.5–5.1)
Sodium: 142 mmol/L (ref 135–145)

## 2021-09-25 LAB — GLUCOSE, CAPILLARY
Glucose-Capillary: 109 mg/dL — ABNORMAL HIGH (ref 70–99)
Glucose-Capillary: 115 mg/dL — ABNORMAL HIGH (ref 70–99)
Glucose-Capillary: 120 mg/dL — ABNORMAL HIGH (ref 70–99)
Glucose-Capillary: 121 mg/dL — ABNORMAL HIGH (ref 70–99)
Glucose-Capillary: 126 mg/dL — ABNORMAL HIGH (ref 70–99)
Glucose-Capillary: 144 mg/dL — ABNORMAL HIGH (ref 70–99)

## 2021-09-25 LAB — VALPROIC ACID LEVEL: Valproic Acid Lvl: 71 ug/mL (ref 50.0–100.0)

## 2021-09-25 MED ORDER — ORAL CARE MOUTH RINSE
15.0000 mL | Freq: Two times a day (BID) | OROMUCOSAL | Status: DC
  Administered 2021-09-25 – 2021-10-22 (×36): 15 mL via OROMUCOSAL

## 2021-09-25 MED ORDER — LABETALOL HCL 5 MG/ML IV SOLN
20.0000 mg | INTRAVENOUS | Status: DC | PRN
  Administered 2021-09-25 – 2021-09-28 (×3): 20 mg via INTRAVENOUS
  Filled 2021-09-25 (×4): qty 4

## 2021-09-25 MED ORDER — CLONIDINE HCL 0.1 MG/24HR TD PTWK
0.1000 mg | MEDICATED_PATCH | TRANSDERMAL | Status: DC
  Administered 2021-09-25 – 2021-10-17 (×4): 0.1 mg via TRANSDERMAL
  Filled 2021-09-25 (×5): qty 1

## 2021-09-25 MED ORDER — SODIUM CHLORIDE 0.9 % IV SOLN: INTRAVENOUS | Status: DC | PRN

## 2021-09-25 MED ORDER — HYDRALAZINE HCL 20 MG/ML IJ SOLN
10.0000 mg | Freq: Once | INTRAMUSCULAR | Status: AC
  Administered 2021-09-25: 10 mg via INTRAVENOUS
  Filled 2021-09-25: qty 1

## 2021-09-25 NOTE — Evaluation (Addendum)
Clinical/Bedside Swallow Evaluation ?Patient Details  ?Name: [name] ?MRN: [id number] ?Date of Birth: May 10, 1956 ? ?Today's Date: 09/25/2021 ?Time: SLP Start Time (ACUTE ONLY): 1450 SLP Stop Time (ACUTE ONLY): 1525 ?SLP Time Calculation (min) (ACUTE ONLY): 35 min ? ?Past Medical History:  ?Past Medical History:  ?Diagnosis Date  ? Diabetes mellitus without complication (HCC)   ? HTN (hypertension)   ? Hyperammonemia (HCC) While on Depakote  ? Hyperlipemia   ? Lithium toxicity   ? Neutropenia (HCC)   ? Schizophrenia (HCC)   ? Seizure (HCC) While toxic on Depakote  ? Sickle cell trait (HCC)   ? Thyroid disease   ? TIA (transient ischemic attack) in 2009  ? ?Past Surgical History: History reviewed. No pertinent surgical history. ?HPI:  ?Pt is a 66 year old male with history of Schizophrenia, DM, hypertension, hyperlipidemia, hypothyroid, noncompliance with medications, recurrent seizures AND tardive dyskinesia who presents emergency department for chief concerns of altered mental status. EMS reported that patient was found on the floor by staff at ALF on 3/13 morning.  Per EDP note, patient was not taking his medications.  In ED, agitated and combative and received IV Haldol 5 mg x 2 and Ativan 2 mg IV x2 on night of arrival.  Admitted for acute encephalopathy likely due to his uncontrolled primary psychiatric disorder i.e. schizophrenia.  Psychiatry is following per chart.  Pt is unable to provide any related history d/t AMS. Pt has remained NPO since admission and is now without adequate nutrition for >7 days. Per chart, pt is down 11lbs(5%) since admission; this is significant weight loss per Dietician.   MRI: No acute intracranial  abnormality.  CXRs since admit: No acute airspace disease, effusion, or pneumothorax; min left basilar atelectasis.  ?  ?Assessment / Plan / Recommendation  ?Clinical Impression ? Pt appears to present w/ oropharyngeal phase dysphagia w/ HIGH risk for aspiration in setting of POOR  arousal and poor response to MAX verbal/tactil/visual stim currently. Attempts w/ stimulation of po's placed at lips, tongue yielded no labial/lingual response to suggest oral awareness of boluses; no lingual movements to suggest swallowing attempt/engagement. Bolus residue was removed from oral cavity via swabs to reduce risk for aspiration.  ?Pt is recommended to remain NPO d/t risk for aspiration and Pulmonary impact/decline. In light of remaining NPO since admission now without adequate nutrition for >7 days and his poor mentation and arousal to engage appropriately and safely w/ po intake, recommend f/u w/ Dietician and MD to discuss options of alternative means of feeding.   ? ?ST services can continue to follow for improvement in pt's medical status to warrant presentation of po intake/trials next 1-3 days. The above was discussed w/ MD, NSG, and Dietician.   ?Recommend frequent oral care for hygiene and stimulation of swallowing at this time. ?SLP Visit Diagnosis: Dysphagia, oropharyngeal phase (R13.12) (poor mental status, arousal) ?   ?Aspiration Risk ? Severe aspiration risk;Risk for inadequate nutrition/hydration  ?  ?Diet Recommendation   NPO w/ Recommend frequent oral care for hygiene and stimulation of swallowing; aspiration precautions ? ?Medication Administration: Via alternative means  ?  ?Other  Recommendations Recommended Consults:  (Dietician f/u; Palliative Care f/u for GOC) ?Oral Care Recommendations: Oral care QID;Staff/trained caregiver to provide oral care ?Other Recommendations:  (TBD)   ? ?Recommendations for follow up therapy are one component of a multi-disciplinary discharge planning process, led by the attending physician.  Recommendations may be updated based on patient status, additional functional criteria and insurance  authorization. ? ?Follow up Recommendations Skilled nursing-short term rehab (<3 hours/day) (TBD)  ? ? ?  ?Assistance Recommended at Discharge Frequent or constant  Supervision/Assistance  ?Functional Status Assessment Patient has had a recent decline in their functional status and/or demonstrates limited ability to make significant improvements in function in a reasonable and predictable amount of time  ?Frequency and Duration min 2x/week  ?1 week ?  ?   ? ?Prognosis Prognosis for Safe Diet Advancement: Guarded ?Barriers to Reach Goals: Cognitive deficits;Language deficits;Time post onset;Severity of deficits;Behavior  ? ?  ? ?Swallow Study   ?General Date of Onset: 09/17/21 ?HPI: Pt is a 66 year old male with history of Schizophrenia, DM, hypertension, hyperlipidemia, hypothyroid, noncompliance with medications, recurrent seizures AND tardive dyskinesia who presents emergency department for chief concerns of altered mental status. EMS reported that patient was found on the floor by staff at ALF on 3/13 morning.  Per EDP note, patient was not taking his medications.  In ED, agitated and combative and received IV Haldol 5 mg x 2 and Ativan 2 mg IV x2 on night of arrival.  Admitted for acute encephalopathy likely due to his uncontrolled primary psychiatric disorder i.e. schizophrenia.  Psychiatry is following per chart.  Pt is unable to provide any related history d/t AMS. Pt has remained NPO since admission and is now without adequate nutrition for >7 days. Per chart, pt is down 11lbs(5%) since admission; this is significant weight loss per Dietician.   MRI: No acute intracranial  abnormality.  CXRs since admit: No acute airspace disease, effusion, or pneumothorax; min left basilar atelectasis. ?Type of Study: Bedside Swallow Evaluation ?Previous Swallow Assessment: none ?Diet Prior to this Study: NPO ?Temperature Spikes Noted: No (wbc 5.3) ?Respiratory Status: Room air (has been on 2L [place]) ?History of Recent Intubation: No ?Behavior/Cognition: Lethargic/Drowsy;Doesn't follow directions (minimal eye opening) ?Oral Cavity Assessment: Dried secretions ?Oral Care Completed by SLP:  Yes ?Oral Cavity - Dentition: Poor condition;Missing dentition (blackened in areas seen) ?Vision:  (n/a) ?Self-Feeding Abilities: Other (Comment) (n/a) ?Patient Positioning: Upright in bed (full assistance required) ?Baseline Vocal Quality:  (nonverbal) ?Volitional Cough: Cognitively unable to elicit ?Volitional Swallow: Unable to elicit  ?  ?Oral/Motor/Sensory Function Overall Oral Motor/Sensory Function:  (open-mouth posture -- did not close mouth when given cues/stim)   ?Ice Chips Ice chips: Impaired ?Presentation: Spoon (fed; 2 trials) ?Oral Phase Impairments: Poor awareness of bolus (no response) ?Other Comments: removed   ?Thin Liquid Thin Liquid: Not tested  ?  ?Nectar Thick Nectar Thick Liquid: Impaired ?Presentation: Spoon (fed; 2 trials placed at lips/tongue) ?Oral Phase Impairments: Poor awareness of bolus (no response) ?Other Comments: removed   ?Honey Thick Honey Thick Liquid: Not tested   ?Puree Puree: Not tested   ?Solid ? ? ?  Solid: Not tested  ? ?  ? ? ? ? ? ?Jerilynn Som, MS, CCC-SLP ?Speech Language Pathologist ?Rehab Services; [facility] -  ?[phone] (ascom) ?Everette Dimauro ?09/25/2021,5:20 PM ? ? ? ?

## 2021-09-25 NOTE — Progress Notes (Signed)
Neurology Progress Note ? ? ?S:// ?Seen and examined ?Has been reportedly awake at times and moving all fours. ? ?O:// ?Current vital signs: ?BP (!) 180/110   Pulse (!) 108   Temp 98.1 ?F (36.7 ?C)   Resp 16   Ht 6\' 3"  (1.905 m)   Wt 97.1 kg   SpO2 100%   BMI 26.75 kg/m?  ?Vital signs in last 24 hours: ?Temp:  [97.7 ?F (36.5 ?C)-99.3 ?F (37.4 ?C)] 98.1 ?F (36.7 ?C) (03/21 0820) ?Pulse Rate:  [97-108] 108 (03/21 0820) ?Resp:  [16-20] 16 (03/21 0820) ?BP: (147-186)/(78-110) 180/110 (03/21 0820) ?SpO2:  [93 %-100 %] 100 % (03/21 0820) ?General: Was sleeping in bed, started mumbling when I call his name. ?HEENT: Normocephalic atraumatic ?CVs: Regular rhythm ?Respiratory: Breathing well saturating normally on room air ?Abdomen nondistended nontender ?Neurological exam ?He was sleeping in bed ?When I called out his name, he attempted to open his eyes and try to track me but did not follow commands purposefully. ?He began mumbling incomprehensible words. ?He also began moving all 4 extremities. ?He resisted eye opening actively-cannot check his pupils or gaze at this time. ?His strength seems to be symmetric and strong at least 4+/5 in all 4 extremities. ? ?Medications ? ?Current Facility-Administered Medications:  ?  Ampicillin-Sulbactam (UNASYN) 3 g in sodium chloride 0.9 % 100 mL IVPB, 3 g, Intravenous, Q6H, 12-26-1990, [facility], Last Rate: 200 mL/hr at 09/25/21 0505, 3 g at 09/25/21 0505 ?  dextrose 5 % with KCl 20 mEq / L  infusion, 20 mEq, Intravenous, Continuous, 09/27/21, MD, Last Rate: 100 mL/hr at 09/25/21 0149, 20 mEq at 09/25/21 0149 ?  enoxaparin (LOVENOX) injection 40 mg, 40 mg, Subcutaneous, Q24H, Cox, Amy N, DO, 40 mg at 09/24/21 2122 ?  folic acid injection 1 mg, 1 mg, Intravenous, Daily, 2123, RPH, 1 mg at 09/24/21 1133 ?  haloperidol decanoate (HALDOL DECANOATE) 100 MG/ML injection 200 mg, 200 mg, Intramuscular, Q30 days, Clapacs, John T, MD, 200 mg at 09/18/21 2206 ?   hydrocerin (EUCERIN) cream, , Topical, BID, 2207, MD, Given at 09/24/21 2200 ?  insulin aspart (novoLOG) injection 0-6 Units, 0-6 Units, Subcutaneous, Q4H, Hongalgi, Anand D, MD, 1 Units at 09/23/21 2340 ?  labetalol (NORMODYNE) injection 20 mg, 20 mg, Intravenous, Q2H PRN, 09/25/21, MD ?  levothyroxine (SYNTHROID, LEVOTHROID) injection 75 mcg, 75 mcg, Intravenous, Daily, Osvaldo Shipper, RPH, 75 mcg at 09/24/21 1132 ?  LORazepam (ATIVAN) injection 2 mg, 2 mg, Intravenous, Q4H PRN, Clapacs, John T, MD, 2 mg at 09/24/21 2229 ?  ondansetron (ZOFRAN) tablet 4 mg, 4 mg, Oral, Q6H PRN **OR** ondansetron (ZOFRAN) injection 4 mg, 4 mg, Intravenous, Q6H PRN, Cox, Amy N, DO ?  polyvinyl alcohol (LIQUIFILM TEARS) 1.4 % ophthalmic solution 1 drop, 1 drop, Right Eye, PRN, 2230, NP ?  valproate (DEPACON) 500 mg in dextrose 5 % 50 mL IVPB, 500 mg, Intravenous, Q12H, Manuela Schwartz, MD, Last Rate: 55 mL/hr at 09/25/21 0002, 500 mg at 09/25/21 0002 ? ?Labs ?CBC ?   ?Component Value Date/Time  ? WBC 5.3 09/25/2021 0426  ? RBC 4.66 09/25/2021 0426  ? HGB 12.5 (L) 09/25/2021 0426  ? HGB 13.5 04/22/2014 1415  ? HCT 39.8 09/25/2021 0426  ? HCT 42.0 04/22/2014 1415  ? PLT 176 09/25/2021 0426  ? PLT 246 04/22/2014 1415  ? MCV 85.4 09/25/2021 0426  ? MCV 82 04/22/2014 1415  ? MCH 26.8 09/25/2021 0426  ?  MCHC 31.4 09/25/2021 0426  ? RDW 14.2 09/25/2021 0426  ? RDW 13.5 04/22/2014 1415  ? LYMPHSABS 0.5 (L) 09/21/2021 0206  ? MONOABS 1.2 (H) 09/21/2021 0206  ? EOSABS 0.0 09/21/2021 0206  ? BASOSABS 0.0 09/21/2021 0206  ? ? ?CMP  ?   ?Component Value Date/Time  ? NA 142 09/25/2021 0426  ? NA 141 04/22/2014 1415  ? K 3.8 09/25/2021 0426  ? K 3.3 (L) 04/22/2014 1415  ? CL 103 09/25/2021 0426  ? CL 108 (H) 04/22/2014 1415  ? CO2 34 (H) 09/25/2021 0426  ? CO2 31 04/22/2014 1415  ? GLUCOSE 123 (H) 09/25/2021 0426  ? GLUCOSE 123 (H) 04/22/2014 1415  ? BUN 16 09/25/2021 0426  ? BUN 9 04/22/2014 1415  ? CREATININE  0.89 09/25/2021 0426  ? CREATININE 1.22 04/22/2014 1415  ? CALCIUM 8.1 (L) 09/25/2021 0426  ? CALCIUM 8.1 (L) 04/22/2014 1415  ? PROT 7.4 09/19/2021 0450  ? PROT 7.3 04/22/2014 1415  ? ALBUMIN 3.3 (L) 09/19/2021 0450  ? ALBUMIN 3.6 04/22/2014 1415  ? AST 20 09/19/2021 0450  ? AST 14 (L) 04/22/2014 1415  ? ALT 9 09/19/2021 0450  ? ALT 16 04/22/2014 1415  ? ALKPHOS 55 09/19/2021 0450  ? ALKPHOS 61 04/22/2014 1415  ? BILITOT 0.7 09/19/2021 0450  ? BILITOT 0.4 04/22/2014 1415  ? GFRNONAA >60 09/25/2021 0426  ? GFRNONAA >60 04/22/2014 1415  ? GFRNONAA >60 03/22/2014 1604  ? GFRAA >60 09/08/2019 1240  ? GFRAA >60 04/22/2014 1415  ? GFRAA >60 03/22/2014 1604  ? ?CK- 256 ?Valproate- 71 ?Ammonia- 29 ?Chest x-ray 09/22/2021-and 09/21/2021-no acute changes. ? ? ?Imaging ?I have reviewed images in epic and the results pertinent to this consultation are: ?CT-head - 09/22/2021 - no acute process intracranially ? ?Assessment: 65/M with extensive psychiatric history and hospitalizations, documented history of seizures with not much history available on details of seizures, came in with altered mental status which initially was thought to be due to supratherapeutic valproate and side effects of low-dose Keppra that he was on. ?Exam yesterday revealed a completely obtunded patient but today he is much easier to awake. ?Keppra was discontinued yesterday.  As needed Ativan added for agitation ?Depakote level has been therapeutic over the past few days after dose adjustments. ?Also has a component of aspiration pneumonia for which she has been treated with antibiotics. ?Unclear etiology of altered mental status at this time with broad differentials including medication side effects from multiple sedating medications, exam not completely consistent with neuroleptic malignant syndrome but low on the differential for now as well. ? ?At this time 1 single etiology for his altered mental status is not sticking out.  His olanzapine was stopped,  Keppra was discontinued, mental status is mildly improving. ?We will continue to watch him for another day, obtain an EEG if he is more cooperative tomorrow and see what that shows. ? ? ?Impression: Multifactorial toxic metabolic encephalopathy versus medication side effect ? ?Recommendations: ?Frequent neurochecks ?Check urinalysis ?Continue on current medications-Keppra was discontinued and so was olanzapine. ?Continue current dose of valproate.  No need to check daily levels for now. ?Consider EEG tomorrow with safety sitter and/or restraints  to ensure staff safety ?Plan discussed with Dr. Rito Ehrlich ?-- ?Milon Dikes, MD ?Neurologist ?[facility] ?Pager: [phone] ? ? ? ?

## 2021-09-25 NOTE — Assessment & Plan Note (Addendum)
Due to his altered mentation he has not had anything to eat or drink by mouth since admission. ? ?3/25: Guardian/DSS finally reached and consented for tube feeds. Attempted but ?unable to place NG tube due to pt unable to follow commands to swallow. ? ?-- Now on TPN ?-- SLP following for swallow evaluations ?-- NPO, high aspiration risk ?-- Palliative care consulted for GOC discussions ?
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
## 2021-09-25 NOTE — Progress Notes (Signed)
Initial Nutrition Assessment ? ?DOCUMENTATION CODES:  ? ?Severe malnutrition in context of acute illness/injury ? ?INTERVENTION:  ? ?Recommend nasogastric tube placement and nutrition support  ? ?If nasogastric tube placed, recommend: ? ?Osmolite 1.5@70ml /hr- Initiate at 41ml/hr and increase by 48ml/hr q 8 hours until goal rate is reached.  ? ?Pro-Source 10ml BID via tube, provides 40kcal and 11g of protein per serving  ? ?Free water flushes 74ml q4 hours to maintain tube patency  ? ?Regimen provides 2600kcal/day, 127g/day protein and 1425ml/day of free water  ? ?Pt at high refeed risk; recommend monitor potassium, magnesium and phosphorus labs daily until stable ? ?NUTRITION DIAGNOSIS:  ? ?Severe Malnutrition related to acute illness as evidenced by moderate fat depletion, moderate muscle depletion, severe muscle depletion,  5 percent weight loss in 1 week, energy intake < or equal to 50% for > or equal to 5 days. ? ?GOAL:  ? ?Patient will meet greater than or equal to 90% of their needs ? ?MONITOR:  ? ?Diet advancement, Labs, Weight trends, Skin, I & O's ? ?REASON FOR ASSESSMENT:  ? ?NPO/Clear Liquid Diet ?  ? ?ASSESSMENT:  ? ?66 year old male with history of schizophrenia, DM, hypertension, hyperlipidemia, hypothyroid, noncompliance with medications, recurrent seizures AND tardive dyskinesia who presents emergency department for chief concerns of altered mental status and aspiration PNA. ? ?Visited pt's room today. Pt unable to provide nutrition related history r/t AMS. Pt has remained NPO since admission and is now without adequate nutrition for >7 days. Per chart, pt is down 11lbs(5%) since admission; this is significant weight loss. Spoke with MD regarding recommendation for nasogastric tube placement and feeds; MD would like to re-assess tomorrow as there are concerns that patient would just remove the tube. If patient is unable to keep nasogastric tube in, would recommend consideration of G-tube with  placement of abdominal wrap. Pt is at high refeed risk. RD will monitor for diet advancement vs the need for nutrition support. ? ?Medications reviewed and include: lovenox, folic acid, insulin, synthroid, unasyn, 5% dextrose w/ KCl @100ml /hr ? ?Labs reviewed: K 3.8 wnl, Mg 2.1 wnl ?Cbgs- 126, 121, 115, 109 x 24 hrs ? ?NUTRITION - FOCUSED PHYSICAL EXAM: ? ?Flowsheet Row Most Recent Value  ?Orbital Region Mild depletion  ?Upper Arm Region Severe depletion  ?Thoracic and Lumbar Region Moderate depletion  ?Buccal Region Mild depletion  ?Temple Region No depletion  ?Clavicle Bone Region Moderate depletion  ?Clavicle and Acromion Bone Region Moderate depletion  ?Scapular Bone Region Moderate depletion  ?Dorsal Hand Unable to assess  ?Patellar Region Severe depletion  ?Anterior Thigh Region Severe depletion  ?Posterior Calf Region Severe depletion  ?Edema (RD Assessment) None  ?Hair Reviewed  ?Eyes Reviewed  ?Mouth Reviewed  ?Skin Reviewed  ?Nails Reviewed  ? ?Diet Order:   ?Diet Order   ? ?       ?  Diet NPO time specified  Diet effective now       ?  ? ?  ?  ? ?  ? ?EDUCATION NEEDS:  ? ?No education needs have been identified at this time ? ?Skin:  Skin Assessment: Reviewed RN Assessment ? ?Last BM:  3/18- TYPE 7 ? ?Height:  ? ?Ht Readings from Last 1 Encounters:  ?09/17/21 6\' 3"  (1.905 m)  ? ? ?Weight:  ? ?Wt Readings from Last 1 Encounters:  ?09/25/21 92.4 kg  ? ? ?Ideal Body Weight:  89 kg ? ?BMI:  Body mass index is 25.46 kg/m?. ? ?Estimated Nutritional  Needs:  ? ?Kcal:  2400-2700kcal/day ? ?Protein:  >120g/day ? ?Fluid:  2.3-2.6L/day ? ?Koleen Distance MS, RD, LDN ?Please refer to [facility] for RD and/or RD on-call/weekend/after hours pager ? ?

## 2021-09-26 DIAGNOSIS — G9340 Encephalopathy, unspecified: Secondary | ICD-10-CM | POA: Diagnosis not present

## 2021-09-26 DIAGNOSIS — Z7189 Other specified counseling: Secondary | ICD-10-CM | POA: Diagnosis not present

## 2021-09-26 DIAGNOSIS — E039 Hypothyroidism, unspecified: Secondary | ICD-10-CM | POA: Diagnosis not present

## 2021-09-26 DIAGNOSIS — I10 Essential (primary) hypertension: Secondary | ICD-10-CM | POA: Diagnosis not present

## 2021-09-26 DIAGNOSIS — E785 Hyperlipidemia, unspecified: Secondary | ICD-10-CM

## 2021-09-26 DIAGNOSIS — R4182 Altered mental status, unspecified: Secondary | ICD-10-CM

## 2021-09-26 LAB — GLUCOSE, CAPILLARY
Glucose-Capillary: 117 mg/dL — ABNORMAL HIGH (ref 70–99)
Glucose-Capillary: 118 mg/dL — ABNORMAL HIGH (ref 70–99)
Glucose-Capillary: 121 mg/dL — ABNORMAL HIGH (ref 70–99)
Glucose-Capillary: 126 mg/dL — ABNORMAL HIGH (ref 70–99)
Glucose-Capillary: 128 mg/dL — ABNORMAL HIGH (ref 70–99)
Glucose-Capillary: 128 mg/dL — ABNORMAL HIGH (ref 70–99)
Glucose-Capillary: 137 mg/dL — ABNORMAL HIGH (ref 70–99)

## 2021-09-26 LAB — BASIC METABOLIC PANEL
Anion gap: 8 (ref 5–15)
BUN: 12 mg/dL (ref 8–23)
CO2: 28 mmol/L (ref 22–32)
Calcium: 8.3 mg/dL — ABNORMAL LOW (ref 8.9–10.3)
Chloride: 105 mmol/L (ref 98–111)
Creatinine, Ser: 0.87 mg/dL (ref 0.61–1.24)
GFR, Estimated: >60 mL/min (ref >60)
Glucose, Bld: 125 mg/dL — ABNORMAL HIGH (ref 70–99)
Potassium: 3.7 mmol/L (ref 3.5–5.1)
Sodium: 141 mmol/L (ref 135–145)

## 2021-09-26 LAB — CBC
HCT: 40.2 % (ref 39.0–52.0)
Hemoglobin: 13 g/dL (ref 13.0–17.0)
MCH: 26.4 pg (ref 26.0–34.0)
MCHC: 32.3 g/dL (ref 30.0–36.0)
MCV: 81.7 fL (ref 80.0–100.0)
Platelets: 212 10*3/uL (ref 150–400)
RBC: 4.92 MIL/uL (ref 4.22–5.81)
RDW: 14.1 % (ref 11.5–15.5)
WBC: 6.7 10*3/uL (ref 4.0–10.5)
nRBC: 0 % (ref 0.0–0.2)

## 2021-09-26 LAB — PHOSPHORUS: Phosphorus: 3.2 mg/dL (ref 2.5–4.6)
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
## 2021-09-26 NOTE — Progress Notes (Signed)
Bilateral soft wrist restraints applied due to pt unable to follow commands and pulling off EEG leads.  ?

## 2021-09-26 NOTE — Progress Notes (Signed)
PT Cancellation Note ? ?Patient Details ?Name: [name] ?MRN: [id number] ?DOB: [birth year] ? ? ?Cancelled Treatment:    Reason Eval/Treat Not Completed: Patient's level of consciousness ?Pt had been seen by PT earlier this admission, orders discontinued 3/20 due lethargy/inability to participate.  New orders for re-eval placed today.  Chart reviewed, attempted to see pt.  He was able to open eyes and mumble a little but did not make eye contact and did not verbalize communicatively, despite simple and multimodal cuing.  PT did attempt to encourage possibly getting to sitting and he appeared to make some ataxic voluntary movements with LEs but on removal of top sheet he was found to have a large stool (of which he was presumably unaware - nursing notified).  PT then attempted to get him to stop moving/getting up and informed of the worsening mess with his continued movement... he continued to move in a random, confused, unaware way.  Pt does not appear safe or appropriate to work with PT this date.  Nursing in room assisting with clean up at end of futile PT attempt.  ? ?Malachi Pro, DPT ?09/26/2021, 4:38 PM ?

## 2021-09-26 NOTE — Procedures (Addendum)
Patient Name: [name]  ?MRN: [id number]  ?Epilepsy Attending: Charlsie Quest  ?Referring Physician/Provider: Milon Dikes, MD ?Date: 09/26/2021 ?Duration: 23.17 mins ? ?Patient history: 65/M with extensive psychiatric history and hospitalizations, documented history of seizures with not much history available on details of seizures, came in with altered mental status. EEG to evaluate for seizure ? ?Level of alertness: Awake ? ?AEDs during EEG study: VPA ? ?Technical aspects: This EEG study was done with scalp electrodes positioned according to the 10-20 International system of electrode placement. Electrical activity was acquired at a sampling rate of 500Hz  and reviewed with a high frequency filter of 70Hz  and a low frequency filter of 1Hz . EEG data were recorded continuously and digitally stored.  ? ?Description: EEG showed continuous generalized 3 to 6 Hz theta-delta slowing. Photic driving was not seen during photic stimulation. Hyperventilation was not performed.    ? ?Of note, study was technically difficult due to significant myogenic artifact. ? ?ABNORMALITY ?- Continuous slow, generalized ? ?IMPRESSION: ?This technically difficult study is suggestive of moderate diffuse encephalopathy, nonspecific etiology. No seizures or epileptiform discharges were seen throughout the recording. ? ?  ? ?

## 2021-09-26 NOTE — Assessment & Plan Note (Addendum)
Resume statin 

## 2021-09-26 NOTE — Progress Notes (Signed)
Neurology Progress Note ? ? ?S:// ?Seen and examined ?Remains very somnolent ? ?O:// ?Current vital signs: ?BP (!) 138/106 (BP Location: Left Arm)   Pulse 93   Temp 97.7 ?F (36.5 ?C) (Oral)   Resp 19   Ht 6\' 3"  (1.905 m)   Wt 91.9 kg   SpO2 98%   BMI 25.32 kg/m?  ?Vital signs in last 24 hours: ?Temp:  [97.7 ?F (36.5 ?C)-99.6 ?F (37.6 ?C)] 97.7 ?F (36.5 ?C) (03/22 08-11-1976) ?Pulse Rate:  [71-108] 93 (03/22 0752) ?Resp:  [19-24] 19 (03/22 0752) ?BP: (138-193)/(78-110) 138/106 (03/22 0752) ?SpO2:  [92 %-100 %] 98 % (03/22 0752) ?Weight:  [91.9 kg-92.9 kg] 91.9 kg (03/22 0500) ?General: Well-developed well-nourished in no distress ?HEENT: Normocephalic atraumatic ?CVs: Regular to the ?Respiratory: Breathing well saturating normally on room air ?Neurological exam ?Very somnolent ?Opens eyes to voice ?Does not follow commands ?Some incomprehensible mumbling ?Gaze is midline.  Does not blink to threat consistently from either side. ?Face appears symmetric ?Moving all 4 extremities spontaneously and with good strength ? ? ?Medications ? ?Current Facility-Administered Medications:  ?  0.9 %  sodium chloride infusion, , Intravenous, PRN, 08-11-1976, MD, Last Rate: 5 mL/hr at 09/26/21 0539, New Bag at 09/26/21 0539 ?  cloNIDine (CATAPRES - Dosed in mg/24 hr) patch 0.1 mg, 0.1 mg, Transdermal, Weekly, 09/28/21, MD, 0.1 mg at 09/25/21 1211 ?  dextrose 5 % with KCl 20 mEq / L  infusion, 20 mEq, Intravenous, Continuous, 1212, MD, Last Rate: 100 mL/hr at 09/26/21 0733, Infusion Verify at 09/26/21 0733 ?  enoxaparin (LOVENOX) injection 40 mg, 40 mg, Subcutaneous, Q24H, Cox, Amy N, DO, 40 mg at 09/25/21 2120 ?  folic acid injection 1 mg, 1 mg, Intravenous, Daily, 2121, RPH, 1 mg at 09/25/21 09/27/21 ?  haloperidol decanoate (HALDOL DECANOATE) 100 MG/ML injection 200 mg, 200 mg, Intramuscular, Q30 days, Clapacs, John T, MD, 200 mg at 09/18/21 2206 ?  hydrocerin (EUCERIN) cream, , Topical, BID,  2207, MD, Given at 09/25/21 2123 ?  insulin aspart (novoLOG) injection 0-6 Units, 0-6 Units, Subcutaneous, Q4H, Hongalgi, Anand D, MD, 1 Units at 09/23/21 2340 ?  labetalol (NORMODYNE) injection 20 mg, 20 mg, Intravenous, Q2H PRN, 09/25/21, MD, 20 mg at 09/26/21 09/28/21 ?  levothyroxine (SYNTHROID, LEVOTHROID) injection 75 mcg, 75 mcg, Intravenous, Daily, 2707, RPH, 75 mcg at 09/25/21 09/27/21 ?  LORazepam (ATIVAN) injection 2 mg, 2 mg, Intravenous, Q4H PRN, Clapacs, John T, MD, 2 mg at 09/24/21 2229 ?  MEDLINE mouth rinse, 15 mL, Mouth Rinse, BID, 2230, MD, 15 mL at 09/25/21 2203 ?  ondansetron (ZOFRAN) tablet 4 mg, 4 mg, Oral, Q6H PRN **OR** ondansetron (ZOFRAN) injection 4 mg, 4 mg, Intravenous, Q6H PRN, Cox, Amy N, DO ?  polyvinyl alcohol (LIQUIFILM TEARS) 1.4 % ophthalmic solution 1 drop, 1 drop, Right Eye, PRN, 2204, NP ?  valproate (DEPACON) 500 mg in dextrose 5 % 50 mL IVPB, 500 mg, Intravenous, Q12H, Manuela Schwartz, MD, Stopped at 09/25/21 2300 ? ?Labs ?CBC ?   ?Component Value Date/Time  ? WBC 6.7 09/26/2021 0411  ? RBC 4.92 09/26/2021 0411  ? HGB 13.0 09/26/2021 0411  ? HGB 13.5 04/22/2014 1415  ? HCT 40.2 09/26/2021 0411  ? HCT 42.0 04/22/2014 1415  ? PLT 212 09/26/2021 0411  ? PLT 246 04/22/2014 1415  ? MCV 81.7 09/26/2021 0411  ? MCV 82 04/22/2014 1415  ? MCH 26.4 09/26/2021 0411  ?  MCHC 32.3 09/26/2021 0411  ? RDW 14.1 09/26/2021 0411  ? RDW 13.5 04/22/2014 1415  ? LYMPHSABS 0.5 (L) 09/21/2021 0206  ? MONOABS 1.2 (H) 09/21/2021 0206  ? EOSABS 0.0 09/21/2021 0206  ? BASOSABS 0.0 09/21/2021 0206  ? ? ?CMP  ?   ?Component Value Date/Time  ? NA 141 09/26/2021 0411  ? NA 141 04/22/2014 1415  ? K 3.7 09/26/2021 0411  ? K 3.3 (L) 04/22/2014 1415  ? CL 105 09/26/2021 0411  ? CL 108 (H) 04/22/2014 1415  ? CO2 28 09/26/2021 0411  ? CO2 31 04/22/2014 1415  ? GLUCOSE 125 (H) 09/26/2021 0411  ? GLUCOSE 123 (H) 04/22/2014 1415  ? BUN 12 09/26/2021 0411  ? BUN 9  04/22/2014 1415  ? CREATININE 0.87 09/26/2021 0411  ? CREATININE 1.22 04/22/2014 1415  ? CALCIUM 8.3 (L) 09/26/2021 0411  ? CALCIUM 8.1 (L) 04/22/2014 1415  ? PROT 7.4 09/19/2021 0450  ? PROT 7.3 04/22/2014 1415  ? ALBUMIN 3.3 (L) 09/19/2021 0450  ? ALBUMIN 3.6 04/22/2014 1415  ? AST 20 09/19/2021 0450  ? AST 14 (L) 04/22/2014 1415  ? ALT 9 09/19/2021 0450  ? ALT 16 04/22/2014 1415  ? ALKPHOS 55 09/19/2021 0450  ? ALKPHOS 61 04/22/2014 1415  ? BILITOT 0.7 09/19/2021 0450  ? BILITOT 0.4 04/22/2014 1415  ? GFRNONAA >60 09/26/2021 0411  ? GFRNONAA >60 04/22/2014 1415  ? GFRNONAA >60 03/22/2014 1604  ? GFRAA >60 09/08/2019 1240  ? GFRAA >60 04/22/2014 1415  ? GFRAA >60 03/22/2014 1604  ? ?CK- 256 ?Valproate- 71 ?Ammonia- 29 ?Chest x-ray 09/22/2021-and 09/21/2021-no acute changes. ? ? ?Imaging ?I have reviewed images in epic and the results pertinent to this consultation are: ?CT-head - 09/22/2021 - no acute process intracranially ? ?Assessment:  ?65/M with extensive psychiatric history and hospitalizations, documented history of seizures with not much history available on details of seizures, came in with altered mental status which initially was thought to be due to supratherapeutic valproate and side effects of low-dose Keppra that he was on. ?Mental status has improved mildly from being completed 22 at least being somewhat arousable by voice. ?Keppra was discontinued 2 days ago.  Depakote level has been therapeutic over the past few days after dose adjustments. ?Also has a component of aspiration pneumonia for which she has been treated with antibiotics.Unclear etiology of altered mental status at this time with broad differentials including medication side effects from multiple sedating medications, exam not completely consistent with neuroleptic malignant syndrome but low on the differential for now as well. ? ?At this time 1 single etiology for his altered mental status is not sticking out.  His olanzapine was  stopped, Keppra was discontinued, mental status is mildly improving. ?Plan to obtain an EEG if he cooperates. ? ?Impression: Multifactorial toxic metabolic encephalopathy vs progression of psychiatric disorder versus medication side effects ? ?Recommendations: ?Frequent neurochecks ?Continue on current medications-Keppra was discontinued and so was olanzapine. ?Continue current dose of valproate.  No need to check daily levels for now. ?EEG ?Plan discussed with Dr. Denton Lank ?-- ?Milon Dikes, MD ?Neurologist ?[facility] ?Pager: [phone] ? ? ? ?

## 2021-09-26 NOTE — Progress Notes (Signed)
Eeg done 

## 2021-09-26 NOTE — Progress Notes (Signed)
EEG complete. Bilateral soft wrist restraints removed ?

## 2021-09-26 NOTE — Assessment & Plan Note (Addendum)
Due to acute illness as evidenced by moderate fat depletion, moderate muscle depletion, severe muscle depletion, 5% weight loss in 1 week, energy intake less than 50% for greater than 5 days. ?--Appreciate dietitian recommendations.  On TPN and IV thiamine during course of hospitalization due to inability to place NG tube.  Now that psychosis has some improvement, patient requesting p.o. and able to take some on dysphagia diet. ?
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
## 2021-09-26 NOTE — Consult Note (Signed)
? ?                                                                                ?Consultation Note ?Date: 09/26/2021  ? ?Patient Name: [name]  ?DOB: 1956-06-12  MRN: [id number]  Age / Sex: 66 y.o., male  ?PCP: Koren Bound, NP ?Referring Physician: Pennie Banter, DO ? ?Reason for Consultation: Establishing goals of care ? ?HPI/Patient Profile:66 year old male with medical history significant for schizophrenia, hypertension, hyperlipidemia, hypothyroid, medication noncompliance, recurrent seizures, tardive dyskinesia, was brought in from ALF by EMS to ED with altered mental status after he was found to be somnolent, not responding to questions as typical and with right eye drainage.  EMS reported that patient was found on the floor by staff at ALF on 3/13 morning. ? ?Clinical Assessment and Goals of Care: ?Notes and labs reviewed. Patient is lying in bed with mittens in place. Watched him for a few moments. His eyes open and he is awake with deep respirations, and then closes them beginning shallow respirations. While he was awake, I spoke to him. He tried to wake up and said "hi" but fell back to sleep.  ? ?Attempted to call DSS guardian unsuccessfully.  ? ?SUMMARY OF RECOMMENDATIONS   ?Poor prognosis. Attempted unsuccessfully to reach DSS worker.  ? ? ? ? ?  ? ?Primary Diagnoses: ?Present on Admission: ? HTN (hypertension) ? Hyperlipidemia ? Hypothyroid ? Schizophrenia (HCC) ? Acute encephalopathy ? ? ?I have reviewed the medical record, interviewed the patient and family, and examined the patient. The following aspects are pertinent. ? ?Past Medical History:  ?Diagnosis Date  ? Diabetes mellitus without complication (HCC)   ? HTN (hypertension)   ? Hyperammonemia (HCC) While on Depakote  ? Hyperlipemia   ? Lithium toxicity   ? Neutropenia (HCC)   ? Schizophrenia (HCC)   ? Seizure (HCC) While toxic on Depakote  ? Sickle cell trait (HCC)   ? Thyroid disease   ? TIA (transient ischemic attack) in 2009   ? ?Social History  ? ?Socioeconomic History  ? Marital status: Single  ?  Spouse name: Not on file  ? Number of children: Not on file  ? Years of education: Not on file  ? Highest education level: Not on file  ?Occupational History  ? Not on file  ?Tobacco Use  ? Smoking status: Never  ? Smokeless tobacco: Never  ?Vaping Use  ? Vaping Use: Never used  ?Substance and Sexual Activity  ? Alcohol use: No  ? Drug use: Not Currently  ? Sexual activity: Not Currently  ?Other Topics Concern  ? Not on file  ?Social History Narrative  ? Not on file  ? ?Social Determinants of Health  ? ?Financial Resource Strain: Not on file  ?Food Insecurity: Not on file  ?Transportation Needs: Not on file  ?Physical Activity: Not on file  ?Stress: Not on file  ?Social Connections: Not on file  ? ?History reviewed. No pertinent family history. ?Scheduled Meds: ? cloNIDine  0.1 mg Transdermal Weekly  ? enoxaparin (LOVENOX) injection  40 mg Subcutaneous Q24H  ? folic acid  1 mg Intravenous Daily  ? haloperidol decanoate  200  mg Intramuscular Q30 days  ? hydrocerin   Topical BID  ? insulin aspart  0-6 Units Subcutaneous Q4H  ? levothyroxine  75 mcg Intravenous Daily  ? mouth rinse  15 mL Mouth Rinse BID  ? ?Continuous Infusions: ? sodium chloride Stopped (09/26/21 1258)  ? dextrose 5 % with KCl 20 mEq / L 100 mL/hr at 09/26/21 1546  ? valproate sodium 500 mg (09/26/21 1021)  ? ?PRN Meds:.sodium chloride, labetalol, LORazepam, ondansetron **OR** ondansetron (ZOFRAN) IV, polyvinyl alcohol ?Medications Prior to Admission:  ?Prior to Admission medications   ?Medication Sig Start Date End Date Taking? Authorizing Provider  ?aspirin EC 81 MG tablet Take 81 mg by mouth daily.   Yes [provider]  ?benztropine (COGENTIN) 1 MG tablet Take 1 tablet (1 mg total) by mouth 2 (two) times daily with a meal. 12/10/18  Yes Mariel CraftMaurer, Sheila M, MD  ?clonazePAM (KLONOPIN) 0.5 MG tablet Take 1 tablet (0.5 mg total) by mouth 2 (two) times daily with a  meal. ?Patient taking differently: Take 0.5 mg by mouth 2 (two) times daily. 10/04/15  Yes Jimmy FootmanHernandez-Gonzalez, Andrea, MD  ?divalproex (DEPAKOTE SPRINKLE) 125 MG capsule Take 1,000 mg by mouth 2 (two) times daily.   Yes [provider]  ?folic acid (FOLVITE) 1 MG tablet Take 1 mg by mouth daily.   Yes [provider]  ?haloperidol (HALDOL) 20 MG tablet Take 20 mg by mouth 2 (two) times daily. 09/10/21  Yes [provider]  ?hydrOXYzine (ATARAX) 50 MG tablet Take 50 mg by mouth 2 (two) times daily as needed for anxiety.   Yes [provider]  ?levETIRAcetam (KEPPRA) 250 MG tablet Take 250 mg by mouth 2 (two) times daily. 09/10/21  Yes [provider]  ?levothyroxine (SYNTHROID, LEVOTHROID) 125 MCG tablet Take 1 tablet (125 mcg total) by mouth daily before breakfast. 10/04/15  Yes Jimmy FootmanHernandez-Gonzalez, Andrea, MD  ?metFORMIN (GLUCOPHAGE) 1000 MG tablet Take 1 tablet (1,000 mg total) by mouth 2 (two) times daily with a meal. 10/04/15  Yes Jimmy FootmanHernandez-Gonzalez, Andrea, MD  ?OLANZapine zydis (ZYPREXA) 20 MG disintegrating tablet Take 1 tablet (20 mg total) by mouth 2 (two) times daily with a meal. ?Patient taking differently: Take 20 mg by mouth in the morning. 12/10/18  Yes Mariel CraftMaurer, Sheila M, MD  ?simvastatin (ZOCOR) 20 MG tablet Take 20 mg by mouth at bedtime.   Yes [provider]  ?traZODone (DESYREL) 150 MG tablet Take 150 mg by mouth at bedtime. 04/03/21  Yes [provider]  ?vitamin B-12 (CYANOCOBALAMIN) 1000 MCG tablet Take 1,000 mcg by mouth daily.   Yes [provider]  ? ?Allergies  ?Allergen Reactions  ? Depakote [Valproic Acid] Other (See Comments)  ?  Reaction:  Hyperammonemia   ? Lithium Other (See Comments)  ?  Pt has a prior history of Lithium toxicity.     ? ?Review of Systems  ?Unable to perform ROS ? ?Physical Exam ?Constitutional:   ?   Comments: Intermittently opens eyes.   ?Skin: ?   General: Skin is warm and dry.  ? ? ?Vital Signs: BP (!)  142/111 (BP Location: Left Arm)   Pulse (!) 108   Temp 97.8 ?F (36.6 ?C) (Oral)   Resp 20   Ht 6\' 3"  (1.905 m)   Wt 91.9 kg   SpO2 96%   BMI 25.32 kg/m?  ?Pain Scale: PAINAD ?  ?Pain Score: 0-No pain ? ? ?SpO2: SpO2: 96 % ?O2 Device:SpO2: 96 % ?O2 Flow Rate: .O2  Flow Rate (L/min): 2 L/min ? ?IO: Intake/output summary:  ?Intake/Output Summary (Last 24 hours) at 09/26/2021 1601 ?Last data filed at 09/26/2021 1546 ?Gross per 24 hour  ?Intake 906.51 ml  ?Output 2400 ml  ?Net -1493.49 ml  ? ? ?LBM: Last BM Date : 09/22/21 ?Baseline Weight: Weight: 97.1 kg ?Most recent weight: Weight: 91.9 kg     ? ?Signed by: ?Morton Stall, NP ?  ?Please contact Palliative Medicine Team phone at [phone] for questions and concerns.  ?For individual provider: See Amion ? ? ? ? ? ? ? ? ? ? ? ? ? ?

## 2021-09-27 DIAGNOSIS — F203 Undifferentiated schizophrenia: Secondary | ICD-10-CM | POA: Diagnosis not present

## 2021-09-27 DIAGNOSIS — G9340 Encephalopathy, unspecified: Secondary | ICD-10-CM | POA: Diagnosis not present

## 2021-09-27 LAB — BASIC METABOLIC PANEL
Anion gap: 7 (ref 5–15)
BUN: 16 mg/dL (ref 8–23)
CO2: 30 mmol/L (ref 22–32)
Calcium: 8.5 mg/dL — ABNORMAL LOW (ref 8.9–10.3)
Chloride: 105 mmol/L (ref 98–111)
Creatinine, Ser: 0.97 mg/dL (ref 0.61–1.24)
GFR, Estimated: >60 mL/min (ref >60)
Glucose, Bld: 125 mg/dL — ABNORMAL HIGH (ref 70–99)
Potassium: 3.9 mmol/L (ref 3.5–5.1)
Sodium: 142 mmol/L (ref 135–145)

## 2021-09-27 LAB — GLUCOSE, CAPILLARY
Glucose-Capillary: 102 mg/dL — ABNORMAL HIGH (ref 70–99)
Glucose-Capillary: 109 mg/dL — ABNORMAL HIGH (ref 70–99)
Glucose-Capillary: 114 mg/dL — ABNORMAL HIGH (ref 70–99)
Glucose-Capillary: 117 mg/dL — ABNORMAL HIGH (ref 70–99)
Glucose-Capillary: 120 mg/dL — ABNORMAL HIGH (ref 70–99)
Glucose-Capillary: 95 mg/dL (ref 70–99)

## 2021-09-27 LAB — MAGNESIUM: Magnesium: 2.1 mg/dL (ref 1.7–2.4)

## 2021-09-27 MED ORDER — HEPARIN SOD (PORK) LOCK FLUSH 100 UNIT/ML IV SOLN
500.0000 [IU] | Freq: Once | INTRAVENOUS | Status: DC
  Filled 2021-09-27: qty 5

## 2021-09-27 MED ORDER — OLANZAPINE 10 MG IM SOLR
15.0000 mg | Freq: Two times a day (BID) | INTRAMUSCULAR | Status: DC
  Administered 2021-09-27 – 2021-10-06 (×19): 15 mg via INTRAMUSCULAR
  Filled 2021-09-27 (×21): qty 20

## 2021-09-27 NOTE — Consult Note (Signed)
[facility] Face-to-Face Psychiatry Consult  ? ?Reason for Consult: Consult to follow up with this 66 year old man with schizophrenia who has been admitted to the medical unit ?Referring Physician: Denton Lank ?Patient Identification: [name] ?MRN:  [id number] ?Principal Diagnosis: Acute encephalopathy ?Diagnosis:  Principal Problem: ?  Acute encephalopathy ?Active Problems: ?  Diabetes (HCC) ?  Hypothyroid ?  Hyperlipidemia ?  HTN (hypertension) ?  Noncompliance ?  Seizure (HCC) ?  Schizophrenia (HCC) ?  Acute conjunctivitis, right eye ?  Aspiration pneumonia (HCC) ?  Hypernatremia ?  Impaired nutrition ?  Protein-calorie malnutrition, severe ? ? ?Total Time spent with patient: 45 minutes ? ?Subjective:   ?[name] is a 66 y.o. male patient admitted with patient is not able to verbalize anything to me. ? ?HPI: Patient seen for follow-up.  66 year old man well known to the psychiatric service with a history of chronic schizophrenia.  Came into the hospital with altered mental status.  Consistent with his typical schizophrenic presentation.  He was admitted instead to the medical service.  Work-up did not reveal any specific medical etiology to account for his altered mental status.  Attempts were made a few days ago to discontinue his psychiatric medicines out of concern that they were over sedating for him.  Situation did not improve.  On interview today found the patient with his eyes open.  Initially did not respond to me verbally.  He was breathing in a snoring like pattern even though he was awake with his tongue rolled back in his mouth.  Very dry mucous membranes.  I talked with him a while tried to get him to respond or engage talk with him about religion one of his favorite topics.  When I finally started talking about ECT he did seem to get a little agitated and tried to vocalize but I could not understand anything he was trying to say.  Patient is not eating and not able to take oral medication not able to  participate in any kind of treatment right now. ? ?Past Psychiatric History: Long history of chronic psychotic disorder frequently presents with almost catatonic like presentation not eating very withdrawn.  Usually gets better once his medicines are restarted ? ?Risk to Self:   ?Risk to Others:   ?Prior Inpatient Therapy:   ?Prior Outpatient Therapy:   ? ?Past Medical History:  ?Past Medical History:  ?Diagnosis Date  ? Diabetes mellitus without complication (HCC)   ? HTN (hypertension)   ? Hyperammonemia (HCC) While on Depakote  ? Hyperlipemia   ? Lithium toxicity   ? Neutropenia (HCC)   ? Schizophrenia (HCC)   ? Seizure (HCC) While toxic on Depakote  ? Sickle cell trait (HCC)   ? Thyroid disease   ? TIA (transient ischemic attack) in 2009  ? History reviewed. No pertinent surgical history. ?Family History: History reviewed. No pertinent family history. ?Family Psychiatric  History: See previous ?Social History:  ?Social History  ? ?Substance and Sexual Activity  ?Alcohol Use No  ?   ?Social History  ? ?Substance and Sexual Activity  ?Drug Use Not Currently  ?  ?Social History  ? ?Socioeconomic History  ? Marital status: Single  ?  Spouse name: Not on file  ? Number of children: Not on file  ? Years of education: Not on file  ? Highest education level: Not on file  ?Occupational History  ? Not on file  ?Tobacco Use  ? Smoking status: Never  ? Smokeless tobacco: Never  ?Vaping Use  ?  Vaping Use: Never used  ?Substance and Sexual Activity  ? Alcohol use: No  ? Drug use: Not Currently  ? Sexual activity: Not Currently  ?Other Topics Concern  ? Not on file  ?Social History Narrative  ? Not on file  ? ?Social Determinants of Health  ? ?Financial Resource Strain: Not on file  ?Food Insecurity: Not on file  ?Transportation Needs: Not on file  ?Physical Activity: Not on file  ?Stress: Not on file  ?Social Connections: Not on file  ? ?Additional Social History: ?  ? ?Allergies:   ?Allergies  ?Allergen Reactions  ? Depakote  [Valproic Acid] Other (See Comments)  ?  Reaction:  Hyperammonemia   ? Lithium Other (See Comments)  ?  Pt has a prior history of Lithium toxicity.     ? ? ?Labs:  ?Results for orders placed or performed during the hospital encounter of 09/17/21 (from the past 48 hour(s))  ?Glucose, capillary     Status: Abnormal  ? Collection Time: 09/25/21  8:59 PM  ?Result Value Ref Range  ? Glucose-Capillary 120 (H) 70 - 99 mg/dL  ?  Comment: Glucose reference range applies only to samples taken after fasting for at least 8 hours.  ? Comment 1 Notify RN   ?Glucose, capillary     Status: Abnormal  ? Collection Time: 09/25/21 11:53 PM  ?Result Value Ref Range  ? Glucose-Capillary 121 (H) 70 - 99 mg/dL  ?  Comment: Glucose reference range applies only to samples taken after fasting for at least 8 hours.  ?CBC     Status: None  ? Collection Time: 09/26/21  4:11 AM  ?Result Value Ref Range  ? WBC 6.7 4.0 - 10.5 K/uL  ? RBC 4.92 4.22 - 5.81 MIL/uL  ? Hemoglobin 13.0 13.0 - 17.0 g/dL  ? HCT 40.2 39.0 - 52.0 %  ? MCV 81.7 80.0 - 100.0 fL  ? MCH 26.4 26.0 - 34.0 pg  ? MCHC 32.3 30.0 - 36.0 g/dL  ? RDW 14.1 11.5 - 15.5 %  ? Platelets 212 150 - 400 K/uL  ? nRBC 0.0 0.0 - 0.2 %  ?  Comment: Performed at [facility], [address], [place], [place] [address]  ?Basic metabolic panel     Status: Abnormal  ? Collection Time: 09/26/21  4:11 AM  ?Result Value Ref Range  ? Sodium 141 135 - 145 mmol/L  ? Potassium 3.7 3.5 - 5.1 mmol/L  ? Chloride 105 98 - 111 mmol/L  ? CO2 28 22 - 32 mmol/L  ? Glucose, Bld 125 (H) 70 - 99 mg/dL  ?  Comment: Glucose reference range applies only to samples taken after fasting for at least 8 hours.  ? BUN 12 8 - 23 mg/dL  ? Creatinine, Ser 0.87 0.61 - 1.24 mg/dL  ? Calcium 8.3 (L) 8.9 - 10.3 mg/dL  ? GFR, Estimated >60 >60 mL/min  ?  Comment: (NOTE) ?Calculated using the CKD-EPI Creatinine Equation (2021) ?  ? Anion gap 8 5 - 15  ?  Comment: Performed at [facility], [address]  [place], [place] [address]  ?Phosphorus     Status: None  ? Collection Time: 09/26/21  4:11 AM  ?Result Value Ref Range  ? Phosphorus 3.2 2.5 - 4.6 mg/dL  ?  Comment: Performed at [facility], [address], [place], [place] [address]  ?Glucose, capillary     Status: Abnormal  ? Collection Time: 09/26/21  4:49 AM  ?  Result Value Ref Range  ? Glucose-Capillary 118 (H) 70 - 99 mg/dL  ?  Comment: Glucose reference range applies only to samples taken after fasting for at least 8 hours.  ?Glucose, capillary     Status: Abnormal  ? Collection Time: 09/26/21  7:54 AM  ?Result Value Ref Range  ? Glucose-Capillary 117 (H) 70 - 99 mg/dL  ?  Comment: Glucose reference range applies only to samples taken after fasting for at least 8 hours.  ? Comment 1 Notify RN   ? Comment 2 Document in Chart   ?Glucose, capillary     Status: Abnormal  ? Collection Time: 09/26/21 11:26 AM  ?Result Value Ref Range  ? Glucose-Capillary 137 (H) 70 - 99 mg/dL  ?  Comment: Glucose reference range applies only to samples taken after fasting for at least 8 hours.  ? Comment 1 Notify RN   ? Comment 2 Document in Chart   ?Glucose, capillary     Status: Abnormal  ? Collection Time: 09/26/21  3:41 PM  ?Result Value Ref Range  ? Glucose-Capillary 126 (H) 70 - 99 mg/dL  ?  Comment: Glucose reference range applies only to samples taken after fasting for at least 8 hours.  ? Comment 1 Notify RN   ? Comment 2 Document in Chart   ?Glucose, capillary     Status: Abnormal  ? Collection Time: 09/26/21  8:45 PM  ?Result Value Ref Range  ? Glucose-Capillary 128 (H) 70 - 99 mg/dL  ?  Comment: Glucose reference range applies only to samples taken after fasting for at least 8 hours.  ?Glucose, capillary     Status: Abnormal  ? Collection Time: 09/26/21 11:37 PM  ?Result Value Ref Range  ? Glucose-Capillary 128 (H) 70 - 99 mg/dL  ?  Comment: Glucose reference range applies only to samples taken after fasting for at least 8 hours.  ?Basic metabolic panel      Status: Abnormal  ? Collection Time: 09/27/21  3:59 AM  ?Result Value Ref Range  ? Sodium 142 135 - 145 mmol/L  ? Potassium 3.9 3.5 - 5.1 mmol/L  ? Chloride 105 98 - 111 mmol/L  ? CO2 30 22 - 32 mmol/L  ? Glucose

## 2021-09-27 NOTE — Progress Notes (Signed)
Tech called nurse into room for assistance. Assessed patient sideways in the bed and trying to get up. Unable to redirect. Administered prn ativan-patient is now calm.  ?
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
## 2021-09-27 NOTE — Plan of Care (Signed)
?  Problem: Education: ?Goal: Knowledge of General Education information will improve ?Description: Including pain rating scale, medication(s)/side effects and non-pharmacologic comfort measures ?09/27/2021 0630 by Ethel Rana, RN ?Outcome: Progressing ?09/27/2021 0630 by Ethel Rana, RN ?Outcome: Progressing ?  ?Problem: Health Behavior/Discharge Planning: ?Goal: Ability to manage health-related needs will improve ?09/27/2021 0630 by Ethel Rana, RN ?Outcome: Progressing ?09/27/2021 0630 by Ethel Rana, RN ?Outcome: Progressing ?  ?Problem: Clinical Measurements: ?Goal: Ability to maintain clinical measurements within normal limits will improve ?09/27/2021 0630 by Ethel Rana, RN ?Outcome: Progressing ?09/27/2021 0630 by Ethel Rana, RN ?Outcome: Progressing ?Goal: Will remain free from infection ?09/27/2021 0630 by Ethel Rana, RN ?Outcome: Progressing ?09/27/2021 0630 by Ethel Rana, RN ?Outcome: Progressing ?Goal: Diagnostic test results will improve ?09/27/2021 0630 by Ethel Rana, RN ?Outcome: Progressing ?09/27/2021 0630 by Ethel Rana, RN ?Outcome: Progressing ?Goal: Respiratory complications will improve ?09/27/2021 0630 by Ethel Rana, RN ?Outcome: Progressing ?09/27/2021 0630 by Ethel Rana, RN ?Outcome: Progressing ?Goal: Cardiovascular complication will be avoided ?09/27/2021 0630 by Ethel Rana, RN ?Outcome: Progressing ?09/27/2021 0630 by Ethel Rana, RN ?Outcome: Progressing ?  ?Problem: Activity: ?Goal: Risk for activity intolerance will decrease ?09/27/2021 0630 by Ethel Rana, RN ?Outcome: Progressing ?09/27/2021 0630 by Ethel Rana, RN ?Outcome: Progressing ?  ?Problem: Nutrition: ?Goal: Adequate nutrition will be maintained ?09/27/2021 0630 by Ethel Rana, RN ?Outcome: Progressing ?09/27/2021 0630 by Ethel Rana, RN ?Outcome: Progressing ?  ?Problem: Coping: ?Goal: Level of anxiety will decrease ?09/27/2021 0630 by Ethel Rana, RN ?Outcome: Progressing ?09/27/2021 0630 by Ethel Rana, RN ?Outcome: Progressing ?  ?Problem: Elimination: ?Goal: Will not experience complications related to bowel motility ?09/27/2021 0630 by Ethel Rana, RN ?Outcome: Progressing ?09/27/2021 0630 by Ethel Rana, RN ?Outcome: Progressing ?Goal: Will not experience complications related to urinary retention ?09/27/2021 0630 by Ethel Rana, RN ?Outcome: Progressing ?09/27/2021 0630 by Ethel Rana, RN ?Outcome: Progressing ?  ?Problem: Pain Managment: ?Goal: General experience of comfort will improve ?09/27/2021 0630 by Ethel Rana, RN ?Outcome: Progressing ?09/27/2021 0630 by Ethel Rana, RN ?Outcome: Progressing ?  ?Problem: Safety: ?Goal: Ability to remain free from injury will improve ?09/27/2021 0630 by Ethel Rana, RN ?Outcome: Progressing ?09/27/2021 0630 by Ethel Rana, RN ?Outcome: Progressing ?  ?Problem: Skin Integrity: ?Goal: Risk for impaired skin integrity will decrease ?09/27/2021 0630 by Ethel Rana, RN ?Outcome: Progressing ?09/27/2021 0630 by Ethel Rana, RN ?Outcome: Progressing ?  ?Problem: Safety: ?Goal: Non-violent Restraint(s) ?09/27/2021 0630 by Ethel Rana, RN ?Outcome: Progressing ?09/27/2021 0630 by Ethel Rana, RN ?Outcome: Progressing ?  ?
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
## 2021-09-27 NOTE — Plan of Care (Addendum)
Spoke with primary team provider. Discussed status and agreed upon poor prognosis. Discussed contacting psychiatry as per their last note regarding possible ECT.  Discussed pain and suffering. Discussed difficulty with speaking with DSS worker. Discussed recommendations for hospice and comfort care given his current status, if there are no other options.  Team to continue trying to reach DSS.   ?

## 2021-09-27 NOTE — Progress Notes (Addendum)
SLP Cancellation Note ? ?Patient Details ?Name: [name] ?MRN: [id number] ?DOB: [birth year] ? ? ?Cancelled treatment:       Reason Eval/Treat Not Completed: Medical issues which prohibited therapy;Patient not medically ready;Patient's level of consciousness (attempted tx session w/ pt; he was unable to maintain alertness.) ?Pt was poorly arousable to Mod-Max stim by this Clinician. Noted ongoing (apparent) Apnea episodes lasting ~20-30 seconds w/ involuntary U/LE movements during recovery breathing post each episode, similar to what was noted yesterday. This is not an appropriate Neurologic presentation for consideration of oral intake. He is at severe risk for aspiration thus Pulmonary decline.  ?ST services will hold on ST services at this time. MD to reconsult if medical status improves for participation in po tasks/oral intake. Recommend frequent oral care for hygiene and stimulation of swallowing. Aspiration precautions. NSG and MD updated.   ? ? ? ? ? ?Orinda Kenner, MS, CCC-SLP ?Speech Language Pathologist ?[facility]; [facility] ?[phone] (ascom) ?Naiya Corral ?09/27/2021, 3:25 PM ?

## 2021-09-27 NOTE — Progress Notes (Signed)
PT Cancellation Note ? ?Patient Details ?Name: [name] ?MRN: [id number] ?DOB: [birth year] ? ? ?Cancelled Treatment:    Reason Eval/Treat Not Completed: Medical issues which prohibited therapy;Patient's level of consciousness ?Spoke with nurse who reports pt has needed multiple Ativan doses, not appropriate for PT at this time.  Spoke with ordering palliative doctor who requests to hold PT eval today but to keep on caseload and follow remotely.   ? ?Malachi Pro, DPT ?09/27/2021, 10:18 AM ?

## 2021-09-28 DIAGNOSIS — G9340 Encephalopathy, unspecified: Secondary | ICD-10-CM | POA: Diagnosis not present

## 2021-09-28 LAB — GLUCOSE, CAPILLARY
Glucose-Capillary: 121 mg/dL — ABNORMAL HIGH (ref 70–99)
Glucose-Capillary: 121 mg/dL — ABNORMAL HIGH (ref 70–99)
Glucose-Capillary: 122 mg/dL — ABNORMAL HIGH (ref 70–99)
Glucose-Capillary: 124 mg/dL — ABNORMAL HIGH (ref 70–99)
Glucose-Capillary: 125 mg/dL — ABNORMAL HIGH (ref 70–99)
Glucose-Capillary: 148 mg/dL — ABNORMAL HIGH (ref 70–99)

## 2021-09-28 NOTE — Progress Notes (Signed)
?   09/28/21 1657  ?Assess: MEWS Score  ?BP 126/89  ?Pulse Rate 96  ?Resp 18  ?SpO2 90 %  ?O2 Device Room Air  ?Assess: MEWS Score  ?MEWS Temp 0  ?MEWS Systolic 0  ?MEWS Pulse 0  ?MEWS RR 0  ?MEWS LOC 0  ?MEWS Score 0  ?MEWS Score Color Green  ?Document  ?Patient Outcome Stabilized after interventions  ?Progress note created (see row info) Yes  ?Assess: SIRS CRITERIA  ?SIRS Temperature  0  ?SIRS Pulse 1  ?SIRS Respirations  0  ?SIRS WBC 0  ?SIRS Score Sum  1  ? ?Patient green MEWs after PRN medication.  VSS.  No other issues. Will continue yellow MEWs monitoring.   ?

## 2021-09-28 NOTE — TOC Progression Note (Signed)
Transition of Care (TOC) - Progression Note  ? ? ?Patient Details  ?Name: [name] ?MRN: [id number] ?Date of Birth: [birth year] ? ?Transition of Care (TOC) CM/SW Contact  ?Chapman Fitch, RN ?Phone Number: ?09/28/2021, 9:49 AM ? ?Clinical Narrative:    ? ?Notified by MD that APS will need notes faxed ?VM left for [facility] [phone]  ?Email sent to [facility] at [facility], following up on what documentation they need.  ? ?Expected Discharge Plan: Group Home ?Barriers to Discharge: Continued Medical Work up ? ?Expected Discharge Plan and Services ?Expected Discharge Plan: Group Home ?  ?Discharge Planning Services: CM Consult ?Post Acute Care Choice: Home Health ?Living arrangements for the past 2 months: Group Home ?                ?  ?  ?  ?  ?  ?  ?  ?  ?  ?  ? ? ?Social Determinants of Health (SDOH) Interventions ?  ? ?Readmission Risk Interventions ?   ? View : No data to display.  ?  ?  ?  ? ? ?
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
## 2021-09-28 NOTE — Progress Notes (Signed)
?   09/28/21 1535  ?Vitals  ?Temp 97.8 ?F (36.6 ?C)  ?BP (!) 167/108  ?MAP (mmHg) 118  ?BP Location Left Arm  ?BP Method Automatic  ?Patient Position (if appropriate) Lying  ?Pulse Rate (!) 127  ?Pulse Rate Source Monitor  ?MEWS COLOR  ?MEWS Score Color Yellow  ?Oxygen Therapy  ?SpO2 95 %  ?O2 Device Room Air  ?MEWS Score  ?MEWS Temp 0  ?MEWS Systolic 0  ?MEWS Pulse 2  ?MEWS RR 0  ?MEWS LOC 0  ?MEWS Score 2  ? ?MD nortified. EKG completed.  Labetalol given as ordered.  Will continue to mon itor. ?

## 2021-09-28 NOTE — Progress Notes (Signed)
PT Cancellation Note ? ?Patient Details ?Name: [name] ?MRN: [id number] ?DOB: 1955/10/08 ? ? ?Cancelled Treatment:    Reason Eval/Treat Not Completed:  (Patient remains grossly lethargic; unable to awaken, maintain alertness or meaningfully respond to therapist.  Will re-attempt at later time/date as medically appropriate and able to participate.   ? ?Per guidelines, will re-attempt one additional session; if remains unable, will discontinue order and re-attempt with new orders as patient able to participate) ? ?Zainab Crumrine H. Manson Passey, PT, DPT, NCS ?09/28/21, 1:12 PM ?[phone] ? ?

## 2021-09-28 NOTE — Progress Notes (Addendum)
Nutrition Follow-up ? ?DOCUMENTATION CODES:  ? ?Severe malnutrition in context of acute illness/injury ? ?INTERVENTION:  ? ?-If NGT is placed, recommend: ? ?Initiate Osmolite 1.5 @ 25 ml/hr and increase by 10 ml every 12 hours to goal rate of 75 ml/hr.  ? ?45 ml Prosource TF TID.   ? ?160 ml free water flush every 4 hours ? ?Tube feeding regimen provides 2820 kcal (100% of needs), 146 grams of protein, and 1372 ml of H2O.  Total free water: 2332 ml daily ? ?-If feedings are pursued, recommend monitor Mg, K, and Phos daily and replete as needed secondary to high refeeding risk ? ?NUTRITION DIAGNOSIS:  ? ?Severe Malnutrition related to acute illness as evidenced by moderate fat depletion, moderate muscle depletion, severe muscle depletion, percent weight loss, energy intake < or equal to 50% for > or equal to 5 days. ? ?Ongoing ? ?GOAL:  ? ?Patient will meet greater than or equal to 90% of their needs ? ?Unmet ? ?MONITOR:  ? ?Diet advancement, Labs, Weight trends, Skin, I & O's ? ?REASON FOR ASSESSMENT:  ? ?NPO/Clear Liquid Diet ?  ? ?ASSESSMENT:  ? ?66 year old male with history of schizophrenia, DM, hypertension, hyperlipidemia, hypothyroid, noncompliance with medications, recurrent seizures AND tardive dyskinesia who presents emergency department for chief concerns of altered mental status and aspiration PNA. ? ?Reviewed I/O's: +557 ml x 24 hours and +2.5 L since admission ? ?UOP: 1.7 L x 24 hours  ? ?Psychiatry recommending ECT treatments.  ? ?Pt has been without nutrition for 8 days. Per SLP notes, pt not appropriate for intake given episodes of apnea and decreased mentation.  ? ?Case discussed with MD. She reports NGT will likely be placed either today or tomorrow; pt will need consent from guardian prior to placing. MD asked how long pt can have NGT; informed that pts typically have temporary NGT for 30 days and after 30 day period, NGT is either replaced or permanent feeding access (ex PEG) is pursued).  ?   ?Medications reviewed and include folic acid and dextrose 5% with KCl mEq/L infusion @ 100 ml/hr.  ? ?Labs reviewed: CBGS: 95-125 (inpatient orders for glycemic control are 0-6 units insulin aspart every 4 hours).   ? ?Diet Order:   ?Diet Order   ? ?       ?  Diet NPO time specified  Diet effective now       ?  ? ?  ?  ? ?  ? ? ?EDUCATION NEEDS:  ? ?No education needs have been identified at this time ? ?Skin:  Skin Assessment: Reviewed RN Assessment ? ?Last BM:  09/26/21 ? ?Height:  ? ?Ht Readings from Last 1 Encounters:  ?09/17/21 6\' 3"  (1.905 m)  ? ? ?Weight:  ? ?Wt Readings from Last 1 Encounters:  ?09/27/21 90.6 kg  ? ? ?Ideal Body Weight:  89 kg ? ?BMI:  Body mass index is 24.97 kg/m?. ? ?Estimated Nutritional Needs:  ? ?Kcal:  09/29/21 ? ?Protein:  130-145 grams ? ?Fluid:  > 2 L ? ? ? ?[id number], RD, LDN, CDCES ?Registered Dietitian II ?Certified Diabetes Care and Education Specialist ?Please refer to [facility] for RD and/or RD on-call/weekend/after hours pager  ?

## 2021-09-28 NOTE — Plan of Care (Signed)
Notes reviewed; psychiatry has reevaluated and will plan to reach out to DSS for consent for ECT. PMT will shadow for needs.  ?

## 2021-09-28 NOTE — TOC Progression Note (Signed)
Transition of Care (TOC) - Progression Note  ? ? ?Patient Details  ?Name: [name] ?MRN: [id number] ?Date of Birth: 06-Aug-1955 ? ?Transition of Care (TOC) CM/SW Contact  ?Chapman Fitch, RN ?Phone Number: ?09/28/2021, 1:02 PM ? ?Clinical Narrative:    ? ?APS requesting.  ?"send over the recommendation of Dr. Denton Lank, Dr. Toni Amend, and the dieticians recommendation.  ?For the ECT treatments can you get clarification on how man treatments and the duration of each treatment. ?For the NG tube what is the duration of how long it can stay in place?" ? ?Information emailed to Burnice Logan, and Steve Rattler at APS ? ?Expected Discharge Plan: Group Home ?Barriers to Discharge: Continued Medical Work up ? ?Expected Discharge Plan and Services ?Expected Discharge Plan: Group Home ?  ?Discharge Planning Services: CM Consult ?Post Acute Care Choice: Home Health ?Living arrangements for the past 2 months: Group Home ?                ?  ?  ?  ?  ?  ?  ?  ?  ?  ?  ? ? ?Social Determinants of Health (SDOH) Interventions ?  ? ?Readmission Risk Interventions ?   ? View : No data to display.  ?  ?  ?  ? ? ?

## 2021-09-29 DIAGNOSIS — G9340 Encephalopathy, unspecified: Secondary | ICD-10-CM | POA: Diagnosis not present

## 2021-09-29 LAB — GLUCOSE, CAPILLARY
Glucose-Capillary: 128 mg/dL — ABNORMAL HIGH (ref 70–99)
Glucose-Capillary: 137 mg/dL — ABNORMAL HIGH (ref 70–99)
Glucose-Capillary: 134 mg/dL — ABNORMAL HIGH (ref 70–99)
Glucose-Capillary: 123 mg/dL — ABNORMAL HIGH (ref 70–99)

## 2021-09-29 MED ORDER — PROSOURCE TF PO LIQD
45.0000 mL | Freq: Three times a day (TID) | ORAL | Status: DC
  Filled 2021-09-29 (×4): qty 45

## 2021-09-29 MED ORDER — OSMOLITE 1.5 CAL PO LIQD
1000.0000 mL | ORAL | Status: DC
Start: 2021-09-29 — End: 2021-09-30

## 2021-09-29 MED ORDER — FREE WATER: 160.0000 mL | Status: DC

## 2021-09-29 NOTE — Progress Notes (Signed)
Patient alert and restless. Will continue to monitor vitals signs closely. ? 09/29/21 2011  ?Assess: MEWS Score  ?Temp 98.3 ?F (36.8 ?C)  ?BP (!) 149/101  ?Pulse Rate (!) 106  ?Resp 17  ?Level of Consciousness Responds to Voice  ?SpO2 100 %  ?O2 Device Room Air  ?Assess: MEWS Score  ?MEWS Temp 0  ?MEWS Systolic 0  ?MEWS Pulse 1  ?MEWS RR 0  ?MEWS LOC 1  ?MEWS Score 2  ?MEWS Score Color Yellow  ?Assess: SIRS CRITERIA  ?SIRS Temperature  0  ?SIRS Pulse 1  ?SIRS Respirations  0  ?SIRS WBC 0  ?SIRS Score Sum  1  ? ? ?

## 2021-09-29 NOTE — Plan of Care (Signed)
?  Problem: Education: ?Goal: Knowledge of General Education information will improve ?Description: Including pain rating scale, medication(s)/side effects and non-pharmacologic comfort measures ?Outcome: Not Progressing ?  ?Problem: Clinical Measurements: ?Goal: Ability to maintain clinical measurements within normal limits will improve ?Outcome: Not Progressing ?Goal: Will remain free from infection ?Outcome: Not Progressing ?Goal: Diagnostic test results will improve ?Outcome: Not Progressing ?Goal: Respiratory complications will improve ?Outcome: Not Progressing ?Goal: Cardiovascular complication will be avoided ?Outcome: Not Progressing ?  ?Problem: Health Behavior/Discharge Planning: ?Goal: Ability to manage health-related needs will improve ?Outcome: Not Progressing ?  ?Problem: Activity: ?Goal: Risk for activity intolerance will decrease ?Outcome: Not Progressing ?  ?Problem: Nutrition: ?Goal: Adequate nutrition will be maintained ?Outcome: Not Progressing ?  ?Problem: Coping: ?Goal: Level of anxiety will decrease ?Outcome: Not Applicable ?  ?Problem: Elimination: ?Goal: Will not experience complications related to bowel motility ?Outcome: Progressing ?Goal: Will not experience complications related to urinary retention ?Outcome: Progressing ?  ?Problem: Pain Managment: ?Goal: General experience of comfort will improve ?Outcome: Progressing ?  ?Problem: Safety: ?Goal: Ability to remain free from injury will improve ?Outcome: Progressing ?  ?Problem: Skin Integrity: ?Goal: Risk for impaired skin integrity will decrease ?Outcome: Progressing ?  ?Problem: Safety: ?Goal: Non-violent Restraint(s) ?Outcome: Progressing ?  ?
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
## 2021-09-29 NOTE — Progress Notes (Signed)
Patient premedicated to help him relax for NG tube insertion. Patient remained restless. Attempts to insert NG tube failed; patient unable to follow directions to swallow while inserting tube. Tube remains out of place at this time. ?

## 2021-09-29 NOTE — Progress Notes (Signed)
Left voicemail for [facility] on cell phone, requested call back regarding consent to place NG tube and initial tube feeds. ?

## 2021-09-29 NOTE — Plan of Care (Signed)
?  Problem: Education: ?Goal: Knowledge of General Education information will improve ?Description: Including pain rating scale, medication(s)/side effects and non-pharmacologic comfort measures ?Outcome: Not Met (add Reason) ? Patient is confused ?

## 2021-09-29 NOTE — TOC Progression Note (Addendum)
Transition of Care (TOC) - Progression Note  ? ? ?Patient Details  ?Name: [name] ?MRN: [id number] ?Date of Birth: 03/05/1956 ? ?Transition of Care (TOC) CM/SW Contact  ?Waynetta Metheny E Tamea Bai, LCSW ?Phone Number: ?09/29/2021, 11:46 AM ? ?Clinical Narrative:   Requested call from on call APS, per Dr. Denton Lank waiting on consent for NG tube and ECT. Need consent for NG tube before Monday if possible.  ? ?1:23- Call from APS Worker on call Fleet Contras who stated she will have their Director call back with consent for NG tube.  ? ?1:32- Call from APS Assistant Director Jarvis Newcomer at [phone] who stated she is giving consent for the NG tube procedure today. Lanette said she is familiar with the situation and documentation that was sent by [facility] yesterday with reason for NG tube. Dr. Denton Lank updated. ? ?Expected Discharge Plan: Group Home ?Barriers to Discharge: Continued Medical Work up ? ?Expected Discharge Plan and Services ?Expected Discharge Plan: Group Home ?  ?Discharge Planning Services: CM Consult ?Post Acute Care Choice: Home Health ?Living arrangements for the past 2 months: Group Home ?                ?  ?  ?  ?  ?  ?  ?  ?  ?  ?  ? ? ?Social Determinants of Health (SDOH) Interventions ?  ? ?Readmission Risk Interventions ?   ? View : No data to display.  ?  ?  ?  ? ? ?

## 2021-09-29 NOTE — Progress Notes (Signed)
PT Cancellation Note ? ?Patient Details ?Name: [name] ?MRN: [id number] ?DOB: [birth year] ? ? ?Cancelled Treatment:    Reason Eval/Treat Not Completed: Fatigue/lethargy limiting ability to participate.  Per nursing pt remains lethargic to the extent that he is unable to follow commands or participate with PT services.  Will complete PT orders at this time but will reassess pt pending a change in status upon receipt of new PT orders. ? ? ?D. Elly Modena PT, DPT ?09/29/21, 8:48 AM ? ?
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
## 2021-09-30 ENCOUNTER — Inpatient Hospital Stay: Payer: Self-pay

## 2021-09-30 DIAGNOSIS — G9340 Encephalopathy, unspecified: Secondary | ICD-10-CM | POA: Diagnosis not present

## 2021-09-30 DIAGNOSIS — E875 Hyperkalemia: Secondary | ICD-10-CM | POA: Diagnosis not present

## 2021-09-30 LAB — BASIC METABOLIC PANEL
Anion gap: 7 (ref 5–15)
BUN: 16 mg/dL (ref 8–23)
CO2: 32 mmol/L (ref 22–32)
Calcium: 8.6 mg/dL — ABNORMAL LOW (ref 8.9–10.3)
Chloride: 104 mmol/L (ref 98–111)
Creatinine, Ser: 0.97 mg/dL (ref 0.61–1.24)
GFR, Estimated: >60 mL/min (ref >60)
Glucose, Bld: 128 mg/dL — ABNORMAL HIGH (ref 70–99)
Potassium: 5.5 mmol/L — ABNORMAL HIGH (ref 3.5–5.1)
Sodium: 143 mmol/L (ref 135–145)

## 2021-09-30 LAB — CBC
HCT: 40.7 % (ref 39.0–52.0)
Hemoglobin: 13.1 g/dL (ref 13.0–17.0)
MCH: 26.9 pg (ref 26.0–34.0)
MCHC: 32.2 g/dL (ref 30.0–36.0)
MCV: 83.6 fL (ref 80.0–100.0)
Platelets: 423 10*3/uL — ABNORMAL HIGH (ref 150–400)
RBC: 4.87 MIL/uL (ref 4.22–5.81)
RDW: 14.1 % (ref 11.5–15.5)
WBC: 7.2 10*3/uL (ref 4.0–10.5)
nRBC: 0 % (ref 0.0–0.2)

## 2021-09-30 LAB — GLUCOSE, CAPILLARY
Glucose-Capillary: 106 mg/dL — ABNORMAL HIGH (ref 70–99)
Glucose-Capillary: 118 mg/dL — ABNORMAL HIGH (ref 70–99)
Glucose-Capillary: 134 mg/dL — ABNORMAL HIGH (ref 70–99)
Glucose-Capillary: 134 mg/dL — ABNORMAL HIGH (ref 70–99)
Glucose-Capillary: 134 mg/dL — ABNORMAL HIGH (ref 70–99)
Glucose-Capillary: 138 mg/dL — ABNORMAL HIGH (ref 70–99)
Glucose-Capillary: 154 mg/dL — ABNORMAL HIGH (ref 70–99)

## 2021-09-30 LAB — MAGNESIUM: Magnesium: 2 mg/dL (ref 1.7–2.4)

## 2021-09-30 LAB — PHOSPHORUS: Phosphorus: 3.3 mg/dL (ref 2.5–4.6)

## 2021-09-30 MED ORDER — CHLORHEXIDINE GLUCONATE CLOTH 2 % EX PADS
6.0000 | MEDICATED_PAD | Freq: Every day | CUTANEOUS | Status: DC
  Administered 2021-09-30 – 2021-10-22 (×15): 6 via TOPICAL

## 2021-09-30 MED ORDER — HALOPERIDOL LACTATE 5 MG/ML IJ SOLN
5.0000 mg | Freq: Once | INTRAMUSCULAR | Status: AC | PRN
Start: 2021-09-30 — End: 2021-10-03
  Administered 2021-10-03: 5 mg via INTRAVENOUS
  Filled 2021-09-30: qty 1

## 2021-09-30 MED ORDER — SODIUM CHLORIDE 0.9% FLUSH
10.0000 mL | Freq: Two times a day (BID) | INTRAVENOUS | Status: DC
  Administered 2021-09-30 – 2021-10-12 (×22): 10 mL
  Administered 2021-10-12: 20 mL
  Administered 2021-10-13: 10 mL
  Administered 2021-10-14: 20 mL
  Administered 2021-10-14 – 2021-10-17 (×6): 10 mL
  Administered 2021-10-19: 20 mL
  Administered 2021-10-19 – 2021-10-22 (×2): 10 mL

## 2021-09-30 MED ORDER — DEXTROSE 5 % IV SOLN: INTRAVENOUS | Status: DC

## 2021-09-30 MED ORDER — FUROSEMIDE 10 MG/ML IJ SOLN
20.0000 mg | Freq: Once | INTRAMUSCULAR | Status: AC
  Administered 2021-09-30: 20 mg via INTRAVENOUS
  Filled 2021-09-30: qty 4

## 2021-09-30 MED ORDER — SODIUM CHLORIDE 0.9% FLUSH: 10.0000 mL | INTRAVENOUS | Status: DC | PRN

## 2021-09-30 NOTE — Progress Notes (Addendum)
Attempted to call the patient guardian, [name] with no answer. RN is aware via secure chat. ?

## 2021-09-30 NOTE — Plan of Care (Signed)
?  Problem: Activity: ?Goal: Risk for activity intolerance will decrease ?Outcome: Not Progressing ?  ?Problem: Skin Integrity: ?Goal: Risk for impaired skin integrity will decrease ?Outcome: Progressing ?  ?

## 2021-09-30 NOTE — Assessment & Plan Note (Signed)
K5.5 this morning.  Remove KCl additive from IV fluid.  Monitor BMP. ?
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
## 2021-09-30 NOTE — Progress Notes (Signed)
Peripherally Inserted Central Catheter Placement ? ?The IV Nurse has discussed with the patient and/or persons authorized to consent for the patient, the purpose of this procedure and the potential benefits and risks involved with this procedure.  The benefits include less needle sticks, lab draws from the catheter, and the patient may be discharged home with the catheter. Risks include, but not limited to, infection, bleeding, blood clot (thrombus formation), and puncture of an artery; nerve damage and irregular heartbeat and possibility to perform a PICC exchange if needed/ordered by physician.  Alternatives to this procedure were also discussed.  Bard Power PICC patient education guide, fact sheet on infection prevention and patient information card has been provided to patient /or left at bedside.  Jasmyne RN obtained consent from legal guardian. Pt somnolent during procedure. ? ?PICC Placement Documentation  ?PICC Double Lumen 09/30/21 Right Basilic 42 cm 1 cm (Active)  ?Indication for Insertion or Continuance of Line Administration of hyperosmolar/irritating solutions (i.e. TPN, Vancomycin, etc.) 09/30/21 1849  ?Exposed Catheter (cm) 1 cm 09/30/21 1849  ?Site Assessment Clean, Dry, Intact 09/30/21 1849  ?Lumen #1 Status Flushed;Saline locked;Blood return noted 09/30/21 1849  ?Lumen #2 Status Flushed;Saline locked;Blood return noted 09/30/21 1849  ?Dressing Type Securing device;Transparent 09/30/21 1849  ?Dressing Status Antimicrobial disc in place;Clean, Dry, Intact 09/30/21 1849  ?Safety Lock Not Applicable 09/30/21 1849  ?Line Care Connections checked and tightened 09/30/21 1849  ?Line Adjustment (NICU/IV Team Only) No 09/30/21 1849  ?Dressing Intervention New dressing 09/30/21 1849  ?Dressing Change Due 10/07/21 09/30/21 1849  ? ? ? ? ? ?Elliot Dally ?09/30/2021, 6:50 PM ? ?

## 2021-10-01 DIAGNOSIS — G9340 Encephalopathy, unspecified: Secondary | ICD-10-CM | POA: Diagnosis not present

## 2021-10-01 LAB — COMPREHENSIVE METABOLIC PANEL
ALT: 38 U/L (ref 0–44)
AST: 39 U/L (ref 15–41)
Albumin: 2.7 g/dL — ABNORMAL LOW (ref 3.5–5.0)
Alkaline Phosphatase: 57 U/L (ref 38–126)
Anion gap: 7 (ref 5–15)
BUN: 15 mg/dL (ref 8–23)
CO2: 32 mmol/L (ref 22–32)
Calcium: 8.3 mg/dL — ABNORMAL LOW (ref 8.9–10.3)
Chloride: 101 mmol/L (ref 98–111)
Creatinine, Ser: 0.83 mg/dL (ref 0.61–1.24)
GFR, Estimated: >60 mL/min (ref >60)
Glucose, Bld: 130 mg/dL — ABNORMAL HIGH (ref 70–99)
Potassium: 3.8 mmol/L (ref 3.5–5.1)
Sodium: 140 mmol/L (ref 135–145)
Total Bilirubin: 0.9 mg/dL (ref 0.3–1.2)
Total Protein: 6.8 g/dL (ref 6.5–8.1)

## 2021-10-01 LAB — GLUCOSE, CAPILLARY
Glucose-Capillary: 115 mg/dL — ABNORMAL HIGH (ref 70–99)
Glucose-Capillary: 119 mg/dL — ABNORMAL HIGH (ref 70–99)
Glucose-Capillary: 127 mg/dL — ABNORMAL HIGH (ref 70–99)
Glucose-Capillary: 129 mg/dL — ABNORMAL HIGH (ref 70–99)
Glucose-Capillary: 133 mg/dL — ABNORMAL HIGH (ref 70–99)
Glucose-Capillary: 149 mg/dL — ABNORMAL HIGH (ref 70–99)

## 2021-10-01 LAB — TRIGLYCERIDES
Triglycerides: 83 mg/dL (ref <150)
Triglycerides: 82 mg/dL (ref <150)

## 2021-10-01 LAB — MAGNESIUM: Magnesium: 1.9 mg/dL (ref 1.7–2.4)

## 2021-10-01 LAB — PHOSPHORUS: Phosphorus: 3.6 mg/dL (ref 2.5–4.6)

## 2021-10-01 MED ORDER — TRAVASOL 10 % IV SOLN
INTRAVENOUS | Status: AC
  Filled 2021-10-01: qty 684

## 2021-10-01 MED ORDER — DEXTROSE 5 % IV SOLN: INTRAVENOUS | Status: DC

## 2021-10-01 MED ORDER — DEXTROSE 5 % IV SOLN: INTRAVENOUS | Status: AC
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
## 2021-10-01 NOTE — Progress Notes (Signed)
PHARMACY - TOTAL PARENTERAL NUTRITION CONSULT NOTE  ? ?Indication:  Intolerance to enteral feeding ? ?Patient Measurements: ?Height: 6\' 3"  (190.5 cm) ?Weight: 85.4 kg (188 lb 4.4 oz) ?IBW/kg (Calculated) : 84.5 ?TPN AdjBW (KG): 97.1 ?Body mass index is 23.53 kg/m?. ? ?Assessment: 66 year old male with medical history significant for schizophrenia, hypertension, hyperlipidemia, hypothyroid, medication noncompliance, recurrent seizures, tardive dyskinesia, was brought in from ALF by EMS to ED with altered mental status. Pharmacy has been consulted for TPN.  ? ?Glucose / Insulin: BG 127-130, 1 u insulin required in past 24 hrs ?Electrolytes: WNL ?Renal: Scr <1 ?Hepatic: WNL ?Intake / Output; MIVF: +3.1 L; D5W @100  ?GI Imaging: ?GI Surgeries / Procedures:  ? ?Central access: PICC line placed 3/26 ?TPN start date: 3/27 ? ?Nutritional Goals: ?Goal TPN rate is 115 mL/hr (provides 138 g of protein and 2787.61 kcals per day) ? ?RD Assessment: ?Estimated Needs ?Total Energy Estimated Needs: [phone] ?Total Protein Estimated Needs: 130-145 grams ?Total Fluid Estimated Needs: > 2 L ? ?Current Nutrition:  ?NPO ? ?Plan:  ?Start TPN at ~half rate at 57 mL/hr at 1800 ?Electrolytes in TPN: Na 35mEq/L, K 79mEq/L, Ca 74mEq/L, Mg 82mEq/L, and Phos 58mmol/L. Cl:Ac 1:1 ?Add standard MVI and trace elements to TPN ?Add thiamine 100 mg x 5 days (day 1/5) ?Continue very sensitive q4h SSI and adjust as needed  ?Reduce MIVF to 50 mL/hr at 1800 ?Monitor TPN labs on Mon/Thurs, daily until stable ? ?Airyn Ellzey O Keyla Milone ?10/01/2021,7:49 AM ? ?

## 2021-10-01 NOTE — TOC Progression Note (Signed)
Transition of Care (TOC) - Progression Note  ? ? ?Patient Details  ?Name: [name] ?MRN: [id number] ?Date of Birth: July 30, 1955 ? ?Transition of Care (TOC) CM/SW Contact  ?Chapman Fitch, RN ?Phone Number: ?10/01/2021, 12:35 PM ? ?Clinical Narrative:    ? ? ?TPN running.  MD to follow up with psych about ECT ?Expected Discharge Plan: Group Home ?Barriers to Discharge: Continued Medical Work up ? ?Expected Discharge Plan and Services ?Expected Discharge Plan: Group Home ?  ?Discharge Planning Services: CM Consult ?Post Acute Care Choice: Home Health ?Living arrangements for the past 2 months: Group Home ?                ?  ?  ?  ?  ?  ?  ?  ?  ?  ?  ? ? ?Social Determinants of Health (SDOH) Interventions ?  ? ?Readmission Risk Interventions ?   ? View : No data to display.  ?  ?  ?  ? ? ?

## 2021-10-01 NOTE — Progress Notes (Signed)
Nutrition Follow-up ? ?DOCUMENTATION CODES:  ? ?Severe malnutrition in context of acute illness/injury ? ?INTERVENTION:  ? ?-TPN management per pharmacy ?-100 mg thiamine x 5 days in TPN due to high refeeding risk ?-Monitor Mg, K, and Phos daily and replete as needed secondary to high refeeding risk ?-RD will monitor to ability to transition to PO diet/ enteral nutrition ? ?NUTRITION DIAGNOSIS:  ? ?Severe Malnutrition related to acute illness as evidenced by moderate fat depletion, moderate muscle depletion, severe muscle depletion, percent weight loss, energy intake < or equal to 50% for > or equal to 5 days. ? ?Ongoing ? ?GOAL:  ? ?Patient will meet greater than or equal to 90% of their needs ? ?Progressing  ? ?MONITOR:  ? ?Diet advancement, Labs, Weight trends, Skin, I & O's ? ?REASON FOR ASSESSMENT:  ? ?NPO/Clear Liquid Diet ?  ? ?ASSESSMENT:  ? ?66 year old male with history of schizophrenia, DM, hypertension, hyperlipidemia, hypothyroid, noncompliance with medications, recurrent seizures AND tardive dyskinesia who presents emergency department for chief concerns of altered mental status and aspiration PNA. ? ?3/24- consent received from PS for ECT, NGT, and TF ?3/25- failed attempt to place NGT ?3/26- PICC placed, TPN initiated  ? ?Reviewed I/O's: -1.2 L x 24 hours and +3.2 L since admission ? ?UOP: 3.5 L x 24 hours  ? ?Case discussed with pharmacist. Plan to initiate TPN today secondary to pt's inability to take PO's and failed NGT placement. Pt is at high refeeding risk secondary to pt's inability to take nutrition for the past 13 days and severe malnutrition. Plan to add thiamine to TPN.  ? ?Medications reviewed and include folic acid and dextrose 5% solution @ 100 ml/hr.  ? ?Labs reviewed: CBGS: 106-138 (inpatient orders for glycemic control are 0-6 units insulin aspart every 4 hours).   ? ?Diet Order:   ?Diet Order   ? ?       ?  Diet NPO time specified  Diet effective now       ?  ? ?  ?  ? ?   ? ? ?EDUCATION NEEDS:  ? ?No education needs have been identified at this time ? ?Skin:  Skin Assessment: Reviewed RN Assessment ? ?Last BM:  09/27/21 ? ?Height:  ? ?Ht Readings from Last 1 Encounters:  ?09/17/21 6\' 3"  (1.905 m)  ? ? ?Weight:  ? ?Wt Readings from Last 1 Encounters:  ?10/01/21 85.4 kg  ? ? ?Ideal Body Weight:  89 kg ? ?BMI:  Body mass index is 23.53 kg/m?. ? ?Estimated Nutritional Needs:  ? ?Kcal:  10/03/21 ? ?Protein:  130-145 grams ? ?Fluid:  > 2 L ? ? ? ?[id number], RD, LDN, CDCES ?Registered Dietitian II ?Certified Diabetes Care and Education Specialist ?Please refer to [facility] for RD and/or RD on-call/weekend/after hours pager  ?

## 2021-10-02 DIAGNOSIS — F203 Undifferentiated schizophrenia: Secondary | ICD-10-CM | POA: Diagnosis not present

## 2021-10-02 DIAGNOSIS — G9340 Encephalopathy, unspecified: Secondary | ICD-10-CM | POA: Diagnosis not present

## 2021-10-02 LAB — COMPREHENSIVE METABOLIC PANEL
ALT: 42 U/L (ref 0–44)
AST: 37 U/L (ref 15–41)
Albumin: 2.7 g/dL — ABNORMAL LOW (ref 3.5–5.0)
Alkaline Phosphatase: 55 U/L (ref 38–126)
Anion gap: 9 (ref 5–15)
BUN: 17 mg/dL (ref 8–23)
CO2: 31 mmol/L (ref 22–32)
Calcium: 8.6 mg/dL — ABNORMAL LOW (ref 8.9–10.3)
Chloride: 101 mmol/L (ref 98–111)
Creatinine, Ser: 0.74 mg/dL (ref 0.61–1.24)
GFR, Estimated: >60 mL/min (ref >60)
Glucose, Bld: 137 mg/dL — ABNORMAL HIGH (ref 70–99)
Potassium: 4 mmol/L (ref 3.5–5.1)
Sodium: 141 mmol/L (ref 135–145)
Total Bilirubin: 0.8 mg/dL (ref 0.3–1.2)
Total Protein: 7 g/dL (ref 6.5–8.1)

## 2021-10-02 LAB — GLUCOSE, CAPILLARY
Glucose-Capillary: 118 mg/dL — ABNORMAL HIGH (ref 70–99)
Glucose-Capillary: 125 mg/dL — ABNORMAL HIGH (ref 70–99)
Glucose-Capillary: 128 mg/dL — ABNORMAL HIGH (ref 70–99)
Glucose-Capillary: 132 mg/dL — ABNORMAL HIGH (ref 70–99)
Glucose-Capillary: 136 mg/dL — ABNORMAL HIGH (ref 70–99)

## 2021-10-02 LAB — CK: Total CK: 129 U/L (ref 49–397)

## 2021-10-02 LAB — TRIGLYCERIDES: Triglycerides: 99 mg/dL (ref <150)

## 2021-10-02 LAB — PHOSPHORUS: Phosphorus: 3.6 mg/dL (ref 2.5–4.6)

## 2021-10-02 LAB — MAGNESIUM: Magnesium: 2 mg/dL (ref 1.7–2.4)

## 2021-10-02 MED ORDER — DEXTROSE 5 % IV SOLN: INTRAVENOUS | Status: AC

## 2021-10-02 MED ORDER — INSULIN ASPART 100 UNIT/ML IJ SOLN: 0.0000 [IU] | Freq: Four times a day (QID) | INTRAMUSCULAR | Status: DC

## 2021-10-02 MED ORDER — TRAVASOL 10 % IV SOLN
INTRAVENOUS | Status: AC
  Filled 2021-10-02: qty 1380

## 2021-10-02 NOTE — Consult Note (Signed)
[facility] Face-to-Face Psychiatry Consult  ? ?Reason for Consult: Follow-up consult for this 66 year old man with schizophrenia who has been withdrawn and unresponsive for quite a while. ?Referring Physician: Denton Lank ?Patient Identification: [name] ?MRN:  [id number] ?Principal Diagnosis: Acute encephalopathy ?Diagnosis:  Principal Problem: ?  Acute encephalopathy ?Active Problems: ?  Diabetes (HCC) ?  Hypothyroid ?  Hyperlipidemia ?  HTN (hypertension) ?  Noncompliance ?  Seizure (HCC) ?  Schizophrenia (HCC) ?  Acute conjunctivitis, right eye ?  Aspiration pneumonia (HCC) ?  Hypernatremia ?  Impaired nutrition ?  Protein-calorie malnutrition, severe ?  Hyperkalemia ? ? ?Total Time spent with patient: 30 minutes ? ?Subjective:   ?[name] is a 66 y.o. male patient admitted with "I want to go home". ? ?HPI: Patient seen this afternoon.  He now has a central line placed for total parenteral nutrition.  Patient was awake in bed wearing mittens.  Mental status still varied from moment to moment.  He would start to close his eyes and look confused again but when I said his name sharply he opened his eyes and was able to speak.  Difficult to understand speech because of very dry mucous membranes.  Patient did tell me that he wanted to go home.  He indicated some acknowledgment of understanding some of what I said to him.  Acknowledged that he had not had anything to eat or drink today.  It appears that he is currently n.p.o. ? ?Past Psychiatric History: Past history of longstanding chronic psychotic disorder with episodes similar to this in the past ? ?Risk to Self:   ?Risk to Others:   ?Prior Inpatient Therapy:   ?Prior Outpatient Therapy:   ? ?Past Medical History:  ?Past Medical History:  ?Diagnosis Date  ? Diabetes mellitus without complication (HCC)   ? HTN (hypertension)   ? Hyperammonemia (HCC) While on Depakote  ? Hyperlipemia   ? Lithium toxicity   ? Neutropenia (HCC)   ? Schizophrenia (HCC)   ? Seizure (HCC)  While toxic on Depakote  ? Sickle cell trait (HCC)   ? Thyroid disease   ? TIA (transient ischemic attack) in 2009  ? History reviewed. No pertinent surgical history. ?Family History: History reviewed. No pertinent family history. ?Family Psychiatric  History: See previous ?Social History:  ?Social History  ? ?Substance and Sexual Activity  ?Alcohol Use No  ?   ?Social History  ? ?Substance and Sexual Activity  ?Drug Use Not Currently  ?  ?Social History  ? ?Socioeconomic History  ? Marital status: Single  ?  Spouse name: Not on file  ? Number of children: Not on file  ? Years of education: Not on file  ? Highest education level: Not on file  ?Occupational History  ? Not on file  ?Tobacco Use  ? Smoking status: Never  ? Smokeless tobacco: Never  ?Vaping Use  ? Vaping Use: Never used  ?Substance and Sexual Activity  ? Alcohol use: No  ? Drug use: Not Currently  ? Sexual activity: Not Currently  ?Other Topics Concern  ? Not on file  ?Social History Narrative  ? Not on file  ? ?Social Determinants of Health  ? ?Financial Resource Strain: Not on file  ?Food Insecurity: Not on file  ?Transportation Needs: Not on file  ?Physical Activity: Not on file  ?Stress: Not on file  ?Social Connections: Not on file  ? ?Additional Social History: ?  ? ?Allergies:   ?Allergies  ?Allergen Reactions  ? Depakote [  Valproic Acid] Other (See Comments)  ?  Reaction:  Hyperammonemia   ? Lithium Other (See Comments)  ?  Pt has a prior history of Lithium toxicity.     ? ? ?Labs:  ?Results for orders placed or performed during the hospital encounter of 09/17/21 (from the past 48 hour(s))  ?Glucose, capillary     Status: Abnormal  ? Collection Time: 09/30/21  4:47 PM  ?Result Value Ref Range  ? Glucose-Capillary 138 (H) 70 - 99 mg/dL  ?  Comment: Glucose reference range applies only to samples taken after fasting for at least 8 hours.  ?Glucose, capillary     Status: Abnormal  ? Collection Time: 09/30/21  8:43 PM  ?Result Value Ref Range  ?  Glucose-Capillary 106 (H) 70 - 99 mg/dL  ?  Comment: Glucose reference range applies only to samples taken after fasting for at least 8 hours.  ?Glucose, capillary     Status: Abnormal  ? Collection Time: 09/30/21 11:36 PM  ?Result Value Ref Range  ? Glucose-Capillary 134 (H) 70 - 99 mg/dL  ?  Comment: Glucose reference range applies only to samples taken after fasting for at least 8 hours.  ?Glucose, capillary     Status: Abnormal  ? Collection Time: 10/01/21  4:50 AM  ?Result Value Ref Range  ? Glucose-Capillary 127 (H) 70 - 99 mg/dL  ?  Comment: Glucose reference range applies only to samples taken after fasting for at least 8 hours.  ?Comprehensive metabolic panel     Status: Abnormal  ? Collection Time: 10/01/21  5:24 AM  ?Result Value Ref Range  ? Sodium 140 135 - 145 mmol/L  ? Potassium 3.8 3.5 - 5.1 mmol/L  ? Chloride 101 98 - 111 mmol/L  ? CO2 32 22 - 32 mmol/L  ? Glucose, Bld 130 (H) 70 - 99 mg/dL  ?  Comment: Glucose reference range applies only to samples taken after fasting for at least 8 hours.  ? BUN 15 8 - 23 mg/dL  ? Creatinine, Ser 0.83 0.61 - 1.24 mg/dL  ? Calcium 8.3 (L) 8.9 - 10.3 mg/dL  ? Total Protein 6.8 6.5 - 8.1 g/dL  ? Albumin 2.7 (L) 3.5 - 5.0 g/dL  ? AST 39 15 - 41 U/L  ? ALT 38 0 - 44 U/L  ? Alkaline Phosphatase 57 38 - 126 U/L  ? Total Bilirubin 0.9 0.3 - 1.2 mg/dL  ? GFR, Estimated >60 >60 mL/min  ?  Comment: (NOTE) ?Calculated using the CKD-EPI Creatinine Equation (2021) ?  ? Anion gap 7 5 - 15  ?  Comment: Performed at [facility], [address] [place], [place] [address]  ?Magnesium     Status: None  ? Collection Time: 10/01/21  5:24 AM  ?Result Value Ref Range  ? Magnesium 1.9 1.7 - 2.4 mg/dL  ?  Comment: Performed at [facility], [address], [place], [place] [address]  ?Phosphorus     Status: None  ? Collection Time: 10/01/21  5:24 AM  ?Result Value Ref Range  ? Phosphorus 3.6 2.5 - 4.6 mg/dL  ?  Comment: Performed at [facility], [address], [place], [place] [address]  ?Triglycerides     Status: None  ? Collection Time: 10/01/21  5:24 AM  ?Result Value Ref Range  ? Triglycerides 83 <150 mg/dL  ?  Comment: Performed at [facility], [address], [place], [place] [address]  ?Triglycerides  Status: None  ? Collection Time: 10/01/21  6:36 AM  ?Result Value Ref Range  ? Triglycerides 82 <150 mg/dL  ?  Comment: Performed at [facility], [address] [place], [place] [id number]  ?Glucose, capillary     Status: Abnormal  ? Collection Time: 10/01/21  7:57 AM  ?Result Value Ref Range  ? Glucose-Capillary 129 (H) 70 - 99 mg/dL  ?  Comment: Glucose reference range applies only to samples taken after fasting for at least 8 hours.  ? Comment 1 Notify RN   ? Comment 2 Document in Chart   ?Glucose, capillary     Status: Abnormal  ? Collection Time: 10/01/21 12:31 PM  ?Result Value Ref Range  ? Glucose-Capillary 119 (H) 70 - 99 mg/dL  ?  Comment: Glucose reference range applies only to samples taken after fasting for at least 8 hours.  ? Comment 1 Notify RN   ? Comment 2 Document in Chart   ?Glucose, capillary     Status: Abnormal  ? Collection Time: 10/01/21  3:32 PM  ?Result Value Ref Range  ? Glucose-Capillary 115 (H) 70 - 99 mg/dL  ?  Comment: Glucose reference range applies only to samples taken after fasting for at least 8 hours.  ? Comment 1 Notify RN   ? Comment 2 Document in Chart   ?Glucose, capillary     Status: Abnormal  ? Collection Time: 10/01/21  7:36 PM  ?Result Value Ref Range  ? Glucose-Capillary 149 (H) 70 - 99 mg/dL  ?  Comment: Glucose reference range applies only to samples taken after fasting for at least 8 hours.  ?Glucose, capillary     Status: Abnormal  ? Collection Time: 10/01/21 11:39 PM  ?Result Value Ref Range  ? Glucose-Capillary 133 (H) 70 - 99 mg/dL  ?  Comment: Glucose reference range applies only to samples taken after fasting for at least 8 hours.  ?Glucose, capillary     Status: Abnormal  ?  Collection Time: 10/02/21  4:23 AM  ?Result Value Ref Range  ? Glucose-Capillary 128 (H) 70 - 99 mg/dL  ?  Comment: Glucose reference range applies only to samples taken after fasting for at least 8 hours.  ?Comp

## 2021-10-02 NOTE — Progress Notes (Signed)
PHARMACY - TOTAL PARENTERAL NUTRITION CONSULT NOTE  ? ?Indication:  Intolerance to enteral feeding ? ?Patient Measurements: ?Height: 6\' 3"  (190.5 cm) ?Weight: 87.7 kg (193 lb 5.5 oz) ?IBW/kg (Calculated) : 84.5 ?TPN AdjBW (KG): 97.1 ?Body mass index is 24.17 kg/m?. ? ?Assessment: 66 year old male with medical history significant for schizophrenia, hypertension, hyperlipidemia, hypothyroid, medication noncompliance, recurrent seizures, tardive dyskinesia, was brought in from ALF by EMS to ED with altered mental status. Pharmacy has been consulted for TPN.  ? ?Glucose / Insulin: BG 115-149, 1 u insulin required in past 24 hrs ?Electrolytes: WNL ?Renal: Scr <1 ?Hepatic: WNL ?Intake / Output; MIVF: +2.8 L; D5W @50  ?GI Imaging: ?GI Surgeries / Procedures:  ? ?Central access: PICC line placed 3/26 ?TPN start date: 3/27 ? ?Nutritional Goals: ?Goal TPN rate is 115 mL/hr (provides 138 g of protein and 2787.61 kcals per day) ? ?RD Assessment: ?Estimated Needs ?Total Energy Estimated Needs: [phone] ?Total Protein Estimated Needs: 130-145 grams ?Total Fluid Estimated Needs: > 2 L ? ?Current Nutrition:  ?NPO ? ?Plan:  ?Advance TPN to goal rate at 115 mL/hr at 1800 ?Electrolytes in TPN: Na 67mEq/L, K 33mEq/L, Ca 17mEq/L, Mg 58mEq/L, and Phos 6mmol/L. Cl:Ac 1:1 ?Add standard MVI and trace elements to TPN ?Add thiamine 100 mg x 5 days (day 2/5) ?Continue very sensitive q6h SSI and adjust as needed  ?Stop MIVF at 1800 ?Monitor TPN labs on Mon/Thurs, daily until stable ? ?Tyresha Fede O Marisela Line ?10/02/2021,7:24 AM ? ?

## 2021-10-02 NOTE — TOC Progression Note (Signed)
Transition of Care (TOC) - Progression Note  ? ? ?Patient Details  ?Name: [name] ?MRN: [id number] ?Date of Birth: 1956-02-18 ? ?Transition of Care (TOC) CM/SW Contact  ?Chapman Fitch, RN ?Phone Number: ?10/02/2021, 9:44 AM ? ?Clinical Narrative:    ? ?Per psych MD 4 messages were left for APS yesterday to discuss ECT.  ? ?I sent an email this morning to Hollie Beach, and Chrissy to follow up.  Received return emtail from [place] stating "I will reach out to our Director and AD and ask that one of them contact him." ? ?Medical team updated ? ? ?Expected Discharge Plan: Group Home ?Barriers to Discharge: Continued Medical Work up ? ?Expected Discharge Plan and Services ?Expected Discharge Plan: Group Home ?  ?Discharge Planning Services: CM Consult ?Post Acute Care Choice: Home Health ?Living arrangements for the past 2 months: Group Home ?                ?  ?  ?  ?  ?  ?  ?  ?  ?  ?  ? ? ?Social Determinants of Health (SDOH) Interventions ?  ? ?Readmission Risk Interventions ?   ? View : No data to display.  ?  ?  ?  ? ? ?
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
## 2021-10-03 DIAGNOSIS — F203 Undifferentiated schizophrenia: Secondary | ICD-10-CM | POA: Diagnosis not present

## 2021-10-03 DIAGNOSIS — G9340 Encephalopathy, unspecified: Secondary | ICD-10-CM | POA: Diagnosis not present

## 2021-10-03 LAB — GLUCOSE, CAPILLARY
Glucose-Capillary: 137 mg/dL — ABNORMAL HIGH (ref 70–99)
Glucose-Capillary: 141 mg/dL — ABNORMAL HIGH (ref 70–99)
Glucose-Capillary: 149 mg/dL — ABNORMAL HIGH (ref 70–99)
Glucose-Capillary: 150 mg/dL — ABNORMAL HIGH (ref 70–99)
Glucose-Capillary: 153 mg/dL — ABNORMAL HIGH (ref 70–99)
Glucose-Capillary: 163 mg/dL — ABNORMAL HIGH (ref 70–99)

## 2021-10-03 LAB — BASIC METABOLIC PANEL
Anion gap: 7 (ref 5–15)
BUN: 21 mg/dL (ref 8–23)
CO2: 29 mmol/L (ref 22–32)
Calcium: 8.4 mg/dL — ABNORMAL LOW (ref 8.9–10.3)
Chloride: 104 mmol/L (ref 98–111)
Creatinine, Ser: 0.79 mg/dL (ref 0.61–1.24)
GFR, Estimated: >60 mL/min (ref >60)
Glucose, Bld: 143 mg/dL — ABNORMAL HIGH (ref 70–99)
Potassium: 4.1 mmol/L (ref 3.5–5.1)
Sodium: 140 mmol/L (ref 135–145)

## 2021-10-03 LAB — MAGNESIUM: Magnesium: 1.9 mg/dL (ref 1.7–2.4)

## 2021-10-03 LAB — VITAMIN B1: Vitamin B1 (Thiamine): 73.4 nmol/L (ref 66.5–200.0)

## 2021-10-03 LAB — PHOSPHORUS: Phosphorus: 3.3 mg/dL (ref 2.5–4.6)

## 2021-10-03 MED ORDER — TRAVASOL 10 % IV SOLN
INTRAVENOUS | Status: AC
  Filled 2021-10-03: qty 1380

## 2021-10-03 NOTE — TOC Progression Note (Signed)
Transition of Care (TOC) - Progression Note  ? ? ?Patient Details  ?Name: [name] ?MRN: [id number] ?Date of Birth: 30-Aug-1955 ? ?Transition of Care (TOC) CM/SW Contact  ?[place] Cellar, RN ?Phone Number: ?10/03/2021, 11:34 AM ? ?Clinical Narrative:    ?Received call from [facility] DSS Director returning call to provide consent for ECT treatment. RN CM updated treatment team and Dr. Toni Amend. Any additional needs or concerns can be sent to Candace directly @ [phone].  ? ? ?Expected Discharge Plan: Group Home ?Barriers to Discharge: Continued Medical Work up ? ?Expected Discharge Plan and Services ?Expected Discharge Plan: Group Home ?  ?Discharge Planning Services: CM Consult ?Post Acute Care Choice: Home Health ?Living arrangements for the past 2 months: Group Home ?                ?  ?  ?  ?  ?  ?  ?  ?  ?  ?  ? ? ?Social Determinants of Health (SDOH) Interventions ?  ? ?Readmission Risk Interventions ?   ? View : No data to display.  ?  ?  ?  ? ? ?

## 2021-10-03 NOTE — Progress Notes (Signed)
Nutrition Follow-up ? ?DOCUMENTATION CODES:  ? ?Severe malnutrition in context of acute illness/injury ? ?INTERVENTION:  ? ?-TPN management per pharmacy ?-Continue 100 mg thiamine x 5 days in TPN due to high refeeding risk ?-Monitor Mg, K, and Phos daily and replete as needed secondary to high refeeding risk ?-RD will monitor to ability to transition to PO diet/ enteral nutrition ? ?NUTRITION DIAGNOSIS:  ? ?Severe Malnutrition related to acute illness as evidenced by moderate fat depletion, moderate muscle depletion, severe muscle depletion, percent weight loss, energy intake < or equal to 50% for > or equal to 5 days. ? ?Ongoing ? ?GOAL:  ? ?Patient will meet greater than or equal to 90% of their needs ? ?Progressing  ? ?MONITOR:  ? ?Diet advancement, Labs, Weight trends, Skin, I & O's ? ?REASON FOR ASSESSMENT:  ? ?NPO/Clear Liquid Diet ?  ? ?ASSESSMENT:  ? ?66 year old male with history of schizophrenia, DM, hypertension, hyperlipidemia, hypothyroid, noncompliance with medications, recurrent seizures AND tardive dyskinesia who presents emergency department for chief concerns of altered mental status and aspiration PNA. ? ?3/24- consent received from PS for ECT, NGT, and TF ?3/25- failed attempt to place NGT ?3/26- PICC placed, TPN initiated  ? ?Reviewed I/O's: +917 ml x 24 hours and +3.1 L since 09/19/21 ? ?UOP: 800 ml x 24 hours ?  ?Per MD notes, pt a little more alert yesterday. DSS returned call for consent to ECT treatments.  ? ?Pt remains NPO. TPN started secondary to inability to take PO's and failed NGT placement. Pt receiving TPN @ 115 ml/hr, which provides 2788 kcals and 138 grams protein. ? ?Reviewed wt hx; pt has experienced a 4% wt loss over the past week, likely related to no nutrition source.  ? ?Medications reviewed.  ? ?Labs reviewed: CBGS: 118-153 (inpatient orders for glycemic control are none).   ? ?Diet Order:   ?Diet Order   ? ?       ?  Diet NPO time specified  Diet effective now       ?  ? ?   ?  ? ?  ? ? ?EDUCATION NEEDS:  ? ?No education needs have been identified at this time ? ?Skin:  Skin Assessment: Reviewed RN Assessment ? ?Last BM:  09/27/21 ? ?Height:  ? ?Ht Readings from Last 1 Encounters:  ?09/17/21 6\' 3"  (1.905 m)  ? ? ?Weight:  ? ?Wt Readings from Last 1 Encounters:  ?10/03/21 88.3 kg  ? ? ?Ideal Body Weight:  89 kg ? ?BMI:  Body mass index is 24.33 kg/m?. ? ?Estimated Nutritional Needs:  ? ?Kcal:  10/05/21 ? ?Protein:  130-145 grams ? ?Fluid:  > 2 L ? ? ? ?1856-3149, RD, LDN, CDCES ?Registered Dietitian II ?Certified Diabetes Care and Education Specialist ?Please refer to [facility] for RD and/or RD on-call/weekend/after hours pager  ?

## 2021-10-03 NOTE — Plan of Care (Signed)
°  Problem: Education: °Goal: Knowledge of General Education information will improve °Description: Including pain rating scale, medication(s)/side effects and non-pharmacologic comfort measures °Outcome: Progressing °  °Problem: Health Behavior/Discharge Planning: °Goal: Ability to manage health-related needs will improve °Outcome: Progressing °  °Problem: Clinical Measurements: °Goal: Ability to maintain clinical measurements within normal limits will improve °Outcome: Progressing °Goal: Will remain free from infection °Outcome: Progressing °Goal: Diagnostic test results will improve °Outcome: Progressing °Goal: Respiratory complications will improve °Outcome: Progressing °Goal: Cardiovascular complication will be avoided °Outcome: Progressing °  °Problem: Activity: °Goal: Risk for activity intolerance will decrease °Outcome: Progressing °  °Problem: Nutrition: °Goal: Adequate nutrition will be maintained °Outcome: Progressing °  °Problem: Elimination: °Goal: Will not experience complications related to bowel motility °Outcome: Progressing °Goal: Will not experience complications related to urinary retention °Outcome: Progressing °  °Problem: Pain Managment: °Goal: General experience of comfort will improve °Outcome: Progressing °  °Problem: Safety: °Goal: Ability to remain free from injury will improve °Outcome: Progressing °  °Problem: Skin Integrity: °Goal: Risk for impaired skin integrity will decrease °Outcome: Progressing °  °Problem: Safety: °Goal: Non-violent Restraint(s) °Outcome: Progressing °  °

## 2021-10-03 NOTE — TOC Progression Note (Signed)
Transition of Care (TOC) - Progression Note  ? ? ?Patient Details  ?Name: [name] ?MRN: [id number] ?Date of Birth: 1956/04/11 ? ?Transition of Care (TOC) CM/SW Contact  ?Chapman Fitch, RN ?Phone Number: ?10/03/2021, 9:39 AM ? ?Clinical Narrative:    ?TOC supervisor notified that psych has been unable to reach APS about ECT consent ? ? ?Expected Discharge Plan: Group Home ?Barriers to Discharge: Continued Medical Work up ? ?Expected Discharge Plan and Services ?Expected Discharge Plan: Group Home ?  ?Discharge Planning Services: CM Consult ?Post Acute Care Choice: Home Health ?Living arrangements for the past 2 months: Group Home ?                ?  ?  ?  ?  ?  ?  ?  ?  ?  ?  ? ? ?Social Determinants of Health (SDOH) Interventions ?  ? ?Readmission Risk Interventions ?   ? View : No data to display.  ?  ?  ?  ? ? ?

## 2021-10-03 NOTE — Consult Note (Signed)
[facility] Face-to-Face Psychiatry Consult   Reason for Consult: Follow-up consult 66 year old man with schizophrenia.  Patient seen and chart reviewed.  When I came to see him he had his eyes closed but opened them and made eye contact at least briefly when I spoke his name.  He was able to tell me his first name.  He was able to tell me that he was currently at [facility] or [facility].  He was not able to answer other questions.  Not able to give any mental health or physical history.  Mental status waxed and waned a little bit.  Would briefly seem to be paying attention and then closed his eyes and look like he was sleeping again. Referring Physician:  Marylu Lund Patient Identification: [name] MRN:  [id number] Principal Diagnosis: Acute encephalopathy Diagnosis:  Principal Problem:   Acute encephalopathy Active Problems:   Diabetes (HCC)   Hypothyroid   Hyperlipidemia   HTN (hypertension)   Noncompliance   Seizure (HCC)   Schizophrenia (HCC)   Acute conjunctivitis, right eye   Aspiration pneumonia (HCC)   Hypernatremia   Impaired nutrition   Protein-calorie malnutrition, severe   Hyperkalemia   Total Time spent with patient: 30 minutes  Subjective:   [name] is a 66 y.o. male patient admitted with patient unable to answer.  HPI: See beginning of note above.  We have been following up [name] who has a known history of schizophrenia.  Came into the hospital with altered mental status and an extended period of not eating and not taking his medicine.  In the past we know that he has done things like this for shorter periods of time but usually clears up more quickly.  Since being here he has been given antipsychotic medicines IM and IV and has been now started on TPN and nevertheless is far off of his baseline.  Unable to answer any questions unable to give any consent or discussed treatment  Past Psychiatric History: Longstanding history of schizophrenia with multiple  relapses.  He has a legal guardian  Risk to Self:   Risk to Others:   Prior Inpatient Therapy:   Prior Outpatient Therapy:    Past Medical History:  Past Medical History:  Diagnosis Date   Diabetes mellitus without complication (HCC)    HTN (hypertension)    Hyperammonemia (HCC) While on Depakote   Hyperlipemia    Lithium toxicity    Neutropenia (HCC)    Schizophrenia (HCC)    Seizure (HCC) While toxic on Depakote   Sickle cell trait (HCC)    Thyroid disease    TIA (transient ischemic attack) in 2009   History reviewed. No pertinent surgical history. Family History: History reviewed. No pertinent family history. Family Psychiatric  History: None reported Social History:  Social History   Substance and Sexual Activity  Alcohol Use No     Social History   Substance and Sexual Activity  Drug Use Not Currently    Social History   Socioeconomic History   Marital status: Single    Spouse name: Not on file   Number of children: Not on file   Years of education: Not on file   Highest education level: Not on file  Occupational History   Not on file  Tobacco Use   Smoking status: Never   Smokeless tobacco: Never  Vaping Use   Vaping Use: Never used  Substance and Sexual Activity   Alcohol use: No   Drug use: Not Currently  Sexual activity: Not Currently  Other Topics Concern   Not on file  Social History Narrative   Not on file   Social Determinants of Health   Financial Resource Strain: Not on file  Food Insecurity: Not on file  Transportation Needs: Not on file  Physical Activity: Not on file  Stress: Not on file  Social Connections: Not on file   Additional Social History:    Allergies:   Allergies  Allergen Reactions   Depakote [Valproic Acid] Other (See Comments)    Reaction:  Hyperammonemia    Lithium Other (See Comments)    Pt has a prior history of Lithium toxicity.       Labs:  Results for orders placed or performed during the hospital  encounter of 09/17/21 (from the past 48 hour(s))  Glucose, capillary     Status: Abnormal   Collection Time: 10/01/21  3:32 PM  Result Value Ref Range   Glucose-Capillary 115 (H) 70 - 99 mg/dL    Comment: Glucose reference range applies only to samples taken after fasting for at least 8 hours.   Comment 1 Notify RN    Comment 2 Document in Chart   Glucose, capillary     Status: Abnormal   Collection Time: 10/01/21  7:36 PM  Result Value Ref Range   Glucose-Capillary 149 (H) 70 - 99 mg/dL    Comment: Glucose reference range applies only to samples taken after fasting for at least 8 hours.  Glucose, capillary     Status: Abnormal   Collection Time: 10/01/21 11:39 PM  Result Value Ref Range   Glucose-Capillary 133 (H) 70 - 99 mg/dL    Comment: Glucose reference range applies only to samples taken after fasting for at least 8 hours.  Glucose, capillary     Status: Abnormal   Collection Time: 10/02/21  4:23 AM  Result Value Ref Range   Glucose-Capillary 128 (H) 70 - 99 mg/dL    Comment: Glucose reference range applies only to samples taken after fasting for at least 8 hours.  Comprehensive metabolic panel     Status: Abnormal   Collection Time: 10/02/21  4:34 AM  Result Value Ref Range   Sodium 141 135 - 145 mmol/L   Potassium 4.0 3.5 - 5.1 mmol/L   Chloride 101 98 - 111 mmol/L   CO2 31 22 - 32 mmol/L   Glucose, Bld 137 (H) 70 - 99 mg/dL    Comment: Glucose reference range applies only to samples taken after fasting for at least 8 hours.   BUN 17 8 - 23 mg/dL   Creatinine, Ser 4.69 0.61 - 1.24 mg/dL   Calcium 8.6 (L) 8.9 - 10.3 mg/dL   Total Protein 7.0 6.5 - 8.1 g/dL   Albumin 2.7 (L) 3.5 - 5.0 g/dL   AST 37 15 - 41 U/L   ALT 42 0 - 44 U/L   Alkaline Phosphatase 55 38 - 126 U/L   Total Bilirubin 0.8 0.3 - 1.2 mg/dL   GFR, Estimated >62 >95 mL/min    Comment: (NOTE) Calculated using the CKD-EPI Creatinine Equation (2021)    Anion gap 9 5 - 15    Comment: Performed at [facility], [address], [place], [place] [address]  Magnesium     Status: None   Collection Time: 10/02/21  4:34 AM  Result Value Ref Range   Magnesium 2.0 1.7 - 2.4 mg/dL    Comment: Performed at [facility], [address] [place], [place] [address]  Phosphorus     Status: None   Collection Time: 10/02/21  4:34 AM  Result Value Ref Range   Phosphorus 3.6 2.5 - 4.6 mg/dL    Comment: Performed at [facility], [address] [place], [place] [address]  Triglycerides     Status: None   Collection Time: 10/02/21  4:34 AM  Result Value Ref Range   Triglycerides 99 <150 mg/dL    Comment: Performed at [facility], [address] [place], [place] [address]  CK     Status: None   Collection Time: 10/02/21  5:00 AM  Result Value Ref Range   Total CK 129 49 - 397 U/L    Comment: Performed at [facility], [address] [place], [place] [address]  Glucose, capillary     Status: Abnormal   Collection Time: 10/02/21  7:34 AM  Result Value Ref Range   Glucose-Capillary 136 (H) 70 - 99 mg/dL    Comment: Glucose reference range applies only to samples taken after fasting for at least 8 hours.  Glucose, capillary     Status: Abnormal   Collection Time: 10/02/21 11:40 AM  Result Value Ref Range   Glucose-Capillary 125 (H) 70 - 99 mg/dL    Comment: Glucose reference range applies only to samples taken after fasting for at least 8 hours.  Glucose, capillary     Status: Abnormal   Collection Time: 10/02/21  3:59 PM  Result Value Ref Range   Glucose-Capillary 132 (H) 70 - 99 mg/dL    Comment: Glucose reference range applies only to samples taken after fasting for at least 8 hours.  Glucose, capillary     Status: Abnormal   Collection Time: 10/02/21 11:56 PM  Result Value Ref Range   Glucose-Capillary 118 (H) 70 - 99 mg/dL    Comment: Glucose reference range applies only to samples taken after fasting for at least 8 hours.  Magnesium      Status: None   Collection Time: 10/03/21  3:00 AM  Result Value Ref Range   Magnesium 1.9 1.7 - 2.4 mg/dL    Comment: Performed at [facility], [address] [place], [place] [address]  Phosphorus     Status: None   Collection Time: 10/03/21  3:00 AM  Result Value Ref Range   Phosphorus 3.3 2.5 - 4.6 mg/dL    Comment: Performed at [facility], [address] [place], [place] [address]  Basic metabolic panel     Status: Abnormal   Collection Time: 10/03/21  3:00 AM  Result Value Ref Range   Sodium 140 135 - 145 mmol/L   Potassium 4.1 3.5 - 5.1 mmol/L   Chloride 104 98 - 111 mmol/L   CO2 29 22 - 32 mmol/L   Glucose, Bld 143 (H) 70 - 99 mg/dL    Comment: Glucose reference range applies only to samples taken after fasting for at least 8 hours.   BUN 21 8 - 23 mg/dL   Creatinine, Ser 1.02 0.61 - 1.24 mg/dL   Calcium 8.4 (L) 8.9 - 10.3 mg/dL   GFR, Estimated >72 >53 mL/min    Comment: (NOTE) Calculated using the CKD-EPI Creatinine Equation (2021)    Anion gap 7 5 - 15    Comment: Performed at [facility], [address] [place], [place] [address]  Glucose, capillary     Status: Abnormal   Collection Time: 10/03/21  5:35 AM  Result  Value Ref Range   Glucose-Capillary 149 (H) 70 - 99 mg/dL    Comment: Glucose reference range applies only to samples taken after fasting for at least 8 hours.  Glucose, capillary     Status: Abnormal   Collection Time: 10/03/21  7:50 AM  Result Value Ref Range   Glucose-Capillary 153 (H) 70 - 99 mg/dL    Comment: Glucose reference range applies only to samples taken after fasting for at least 8 hours.   Comment 1 Notify RN    Comment 2 Document in Chart   Glucose, capillary     Status: Abnormal   Collection Time: 10/03/21 11:42 AM  Result Value Ref Range   Glucose-Capillary 141 (H) 70 - 99 mg/dL    Comment: Glucose reference range applies only to samples taken after fasting for at least 8 hours.    Current  Facility-Administered Medications  Medication Dose Route Frequency Provider Last Rate Last Admin   0.9 %  sodium chloride infusion   Intravenous PRN Osvaldo Shipper, MD   Stopped at 09/27/21 2126   Chlorhexidine Gluconate Cloth 2 % PADS 6 each  6 each Topical Daily Esaw Grandchild A, DO   6 each at 10/03/21 2130   cloNIDine (CATAPRES - Dosed in mg/24 hr) patch 0.1 mg  0.1 mg Transdermal Weekly Osvaldo Shipper, MD   0.1 mg at 10/02/21 1204   enoxaparin (LOVENOX) injection 40 mg  40 mg Subcutaneous Q24H Cox, Amy N, DO   40 mg at 10/02/21 2128   folic acid injection 1 mg  1 mg Intravenous Daily Sharen Hones, RPH   1 mg at 10/03/21 8657   haloperidol decanoate (HALDOL DECANOATE) 100 MG/ML injection 200 mg  200 mg Intramuscular Q30 days Raedyn Wenke T, MD   200 mg at 09/18/21 2206   haloperidol lactate (HALDOL) injection 5 mg  5 mg Intravenous Once PRN Esaw Grandchild A, DO       insulin aspart (novoLOG) injection 0-6 Units  0-6 Units Subcutaneous Q6H Rauer, Samantha O, RPH       labetalol (NORMODYNE) injection 20 mg  20 mg Intravenous Q2H PRN Osvaldo Shipper, MD   20 mg at 09/28/21 1546   levothyroxine (SYNTHROID, LEVOTHROID) injection 75 mcg  75 mcg Intravenous Daily Sharen Hones, RPH   75 mcg at 10/03/21 0854   LORazepam (ATIVAN) injection 2 mg  2 mg Intravenous Q4H PRN Seldon Barrell, Jackquline Denmark, MD   2 mg at 09/30/21 1757   MEDLINE mouth rinse  15 mL Mouth Rinse BID Osvaldo Shipper, MD   15 mL at 10/03/21 0857   OLANZapine (ZYPREXA) injection 15 mg  15 mg Intramuscular BID Jenine Krisher, Jackquline Denmark, MD   15 mg at 10/03/21 0857   ondansetron (ZOFRAN) tablet 4 mg  4 mg Oral Q6H PRN Cox, Amy N, DO       Or   ondansetron (ZOFRAN) injection 4 mg  4 mg Intravenous Q6H PRN Cox, Amy N, DO       polyvinyl alcohol (LIQUIFILM TEARS) 1.4 % ophthalmic solution 1 drop  1 drop Right Eye PRN Manuela Schwartz, NP       sodium chloride flush (NS) 0.9 % injection 10-40 mL  10-40 mL Intracatheter Q12H Esaw Grandchild A, DO   10  mL at 10/03/21 0857   sodium chloride flush (NS) 0.9 % injection 10-40 mL  10-40 mL Intracatheter PRN Esaw Grandchild A, DO       TPN ADULT (ION)   Intravenous Continuous TPN  Raiford Noble, RPH 115 mL/hr at 10/02/21 1727 New Bag at 10/02/21 1727   TPN ADULT (ION)   Intravenous Continuous TPN Rauer, Robyne Peers, RPH       valproate (DEPACON) 500 mg in dextrose 5 % 50 mL IVPB  500 mg Intravenous Q12H Osvaldo Shipper, MD 55 mL/hr at 10/03/21 0854 500 mg at 10/03/21 1914    Musculoskeletal: Strength & Muscle Tone: decreased and atrophy Gait & Station: unable to stand Patient leans: N/A            Psychiatric Specialty Exam:  Presentation  General Appearance: Bizarre; Disheveled  Eye Contact:Fair  Speech:Slow; Blocked  Speech Volume:Decreased  Handedness:Right   Mood and Affect  Mood:Irritable  Affect:Constricted   Thought Process  Thought Processes:Disorganized  Descriptions of Associations:Loose  Orientation:Partial  Thought Content:Scattered  History of Schizophrenia/Schizoaffective disorder:Yes  Duration of Psychotic Symptoms:Greater than six months  Hallucinations:No data recorded Ideas of Reference:Paranoia  Suicidal Thoughts:No data recorded Homicidal Thoughts:No data recorded  Sensorium  Memory:Recent Poor; Remote Poor  Judgment:Impaired  Insight:Poor   Executive Functions  Concentration:Poor  Attention Span:Poor  Recall:Poor  Fund of Knowledge:Poor  Language:Poor   Psychomotor Activity  Psychomotor Activity:No data recorded  Assets  Assets:Social Support; Housing   Sleep  Sleep:No data recorded  Physical Exam: Physical Exam Vitals and nursing note reviewed.  Constitutional:      Appearance: He is ill-appearing.  HENT:     Head: Normocephalic and atraumatic.     Mouth/Throat:     Pharynx: Oropharynx is clear.  Eyes:     Pupils: Pupils are equal, round, and reactive to light.  Cardiovascular:     Rate and  Rhythm: Normal rate and regular rhythm.  Pulmonary:     Effort: Pulmonary effort is normal.     Breath sounds: Normal breath sounds.  Abdominal:     General: Abdomen is flat.     Palpations: Abdomen is soft.  Musculoskeletal:        General: Normal range of motion.  Skin:    General: Skin is warm and dry.  Neurological:     General: No focal deficit present.     Mental Status: Mental status is at baseline.  Psychiatric:        Attention and Perception: He is inattentive.        Mood and Affect: Mood normal. Affect is blunt.        Speech: He is noncommunicative.        Behavior: Behavior is withdrawn.        Cognition and Memory: Cognition is impaired.   Review of Systems  Unable to perform ROS: Psychiatric disorder  Blood pressure (!) 144/69, pulse 99, temperature 98.6 F (37 C), resp. rate 18, height 6\' 3"  (1.905 m), weight 88.3 kg, SpO2 98 %. Body mass index is 24.33 kg/m.  Treatment Plan Summary: Plan I had recommended electroconvulsive therapy given his lack of response to antipsychotic medication and continued very disabled state where he is not eating and not able to interact appropriately not able to do any of his own self care.  He is slightly better today but certainly nowhere near his baseline.  Given this I continue to recommend ECT as an appropriate treatment for schizophrenia with a catatonic like presentation.  I spoke today with a legal guardian representative at [facility] of social services and explained the nature of ECT treatment, the rationale for the recommendation, the alternatives and potential benefits and negative outcomes.  Representative  gave verbal consent which has been documented and witnessed.  Patient will be started on electroconvulsive therapy Friday.  He has not been eating and I think his diet remains n.p.o. but in any case we will make sure he is n.p.o. Friday.  ECT team aware.  I reviewed his labs and I do not think there is anything  else we need to order right now.  The EKG and electrolytes and head CT are all up-to-date.  Patient informed of the plan although he was not able to indicate any understanding.  Disposition:  see note  Mordecai Rasmussen, MD 10/03/2021 1:45 PM

## 2021-10-03 NOTE — Progress Notes (Signed)
PHARMACY - TOTAL PARENTERAL NUTRITION CONSULT NOTE  ? ?Indication:  Intolerance to enteral feeding ? ?Patient Measurements: ?Height: 6\' 3"  (190.5 cm) ?Weight: 88.3 kg (194 lb 10.7 oz) ?IBW/kg (Calculated) : 84.5 ?TPN AdjBW (KG): 97.1 ?Body mass index is 24.33 kg/m?. ? ?Assessment: 66 year old male with medical history significant for schizophrenia, hypertension, hyperlipidemia, hypothyroid, medication noncompliance, recurrent seizures, tardive dyskinesia, was brought in from ALF by EMS to ED with altered mental status. Pharmacy has been consulted for TPN.  ? ?Glucose / Insulin: BG 143-149, 0 u insulin required in past 24 hrs ?Electrolytes: WNL ?Renal: Scr <1 ?Hepatic: WNL ?Intake / Output; MIVF: +3.9 L ?GI Imaging: ?GI Surgeries / Procedures:  ? ?Central access: PICC line placed 3/26 ?TPN start date: 3/27 ? ?Nutritional Goals: ?Goal TPN rate is 115 mL/hr (provides 138 g of protein and 2787.61 kcals per day) ? ?RD Assessment: ?Estimated Needs ?Total Energy Estimated Needs: [phone] ?Total Protein Estimated Needs: 130-145 grams ?Total Fluid Estimated Needs: > 2 L ? ?Current Nutrition:  ?NPO ? ?Plan:  ?Continue TPN at goal rate of 115 mL/hr at 1800 ?Electrolytes in TPN: Na 13mEq/L, K 45mEq/L, Ca 17mEq/L, Mg 10mEq/L, and Phos 35mmol/L. Cl:Ac 1:1 ?Add standard MVI and trace elements to TPN ?Add thiamine 100 mg x 5 days (day 3/5) ?Continue very sensitive q6h SSI and adjust as needed. Consider q8h or discontinuing if pt continues to not require insulin ?Monitor TPN labs on Mon/Thurs, daily until stable ? ?Ludie Hudon O Gleen Ripberger ?10/03/2021,7:25 AM ? ?

## 2021-10-03 NOTE — Progress Notes (Signed)
?PROGRESS NOTE ? ? ? ?Shemuel Mccully  [id number] DOB: 1956/06/06 DOA: 09/17/2021 ?PCP: Terrill Mohr, NP  ? ? ?Brief Narrative:  ?66 year old male with medical history significant for schizophrenia, hypertension, hyperlipidemia, hypothyroid, medication noncompliance, recurrent seizures, tardive dyskinesia, was brought in from ALF by EMS to ED with altered mental status after he was found to be somnolent, not responding to questions as typical and with right eye drainage.  EMS reported that patient was found on the floor by staff at ALF on 3/13 morning.  Per EDP note, patient was not taking his medications.  In ED, agitated and combative and received IV Haldol 5 mg x 2 and Ativan 2 mg IV x2 on night of arrival.  Admitted for acute encephalopathy likely due to his uncontrolled primary psychiatric disorder i.e. schizophrenia.   ?  ?Psychiatry and neurology consulted.  Patient was given long-acting Haldol.  Depakote level was noted to be elevated and Depakote dose was changed.   ?  ?3/21: Patient's mentation worsened over few days.  Was noted to be febrile with new cough.  Started on antibiotics for possible aspiration pneumonia, completed course of Unasyn.  SLP evaluated swallow and deemed patient severe aspiration risk, patient made NPO.  Dietitian following.   Unable to place NG tube, pt does not swallow on command.  Now on TPN, started 3/27.  ?  ?3/22 >> persistent severe encephalopathy, apneic spells but has been overall stable. ?  ?3/28: stable, slight improvement since starting TPN and IV thiamine. He made eye contact today and attempted to speak to me (mumbled), first interaction I've seen from him, although it was brief ? ?3/29 eyes closed. Snoring loudly. Does not open eyes during exam.  ? ?Consultants:  ?psychiatry ? ?Procedures:  ? ?Antimicrobials:  ?  ? ? ?Subjective: ?As above. Snoring , sleeping. Unable to wake him up ? ?Objective: ?Vitals:  ? 10/02/21 1950 10/03/21 0406 10/03/21 0450 10/03/21 [id number]   ?BP: 119/63  105/90 (!) 144/69  ?Pulse: (!) 102  (!) 110 99  ?Resp: 18  18 18   ?Temp: 97.9 ?F (36.6 ?C)  97.8 ?F (36.6 ?C) 98.6 ?F (37 ?C)  ?TempSrc:   Oral   ?SpO2: 98%  100% 98%  ?Weight:  88.3 kg    ?Height:      ? ? ?Intake/Output Summary (Last 24 hours) at 10/03/2021 1321 ?Last data filed at 10/03/2021 1050 ?Gross per 24 hour  ?Intake 1736.89 ml  ?Output 2200 ml  ?Net -463.11 ml  ? ?Filed Weights  ? 10/01/21 0500 10/02/21 0500 10/03/21 0406  ?Weight: 85.4 kg 87.7 kg 88.3 kg  ? ? ?Examination: ?Calm, NAD in deep slee ?Cta no w/r anteriolry ?Reg s1/s2 no gallop ?Soft benign +bs ?No edema ?Unable to exam ?Mood and affect appropriate in current setting  ? ? ? ?Data Reviewed: I have personally reviewed following labs and imaging studies ? ?CBC: ?Recent Labs  ?Lab 09/30/21 ?[id number]  ?WBC 7.2  ?HGB 13.1  ?HCT 40.7  ?MCV 83.6  ?PLT 423*  ? ?Basic Metabolic Panel: ?Recent Labs  ?Lab 09/27/21 ?0359 09/30/21 ?0414 10/01/21 ?[id number] 10/02/21 ?0434 10/03/21 ?0300  ?NA 142 143 140 141 140  ?K 3.9 5.5* 3.8 4.0 4.1  ?CL 105 104 101 101 104  ?CO2 30 32 32 31 29  ?GLUCOSE 125* 128* 130* 137* 143*  ?BUN 16 16 15 17 21   ?CREATININE 0.97 0.97 0.83 0.74 0.79  ?CALCIUM 8.5* 8.6* 8.3* 8.6* 8.4*  ?MG 2.1 2.0 1.9 2.0 1.9  ?PHOS  --  3.3 3.6 3.6 3.3  ? ?GFR: ?Estimated Creatinine Clearance: 108.6 mL/min (by C-G formula based on SCr of 0.79 mg/dL). ?Liver Function Tests: ?Recent Labs  ?Lab 10/01/21 ?[id number] 10/02/21 ?0434  ?AST 39 37  ?ALT 38 42  ?ALKPHOS 57 55  ?BILITOT 0.9 0.8  ?PROT 6.8 7.0  ?ALBUMIN 2.7* 2.7*  ? ?No results for input(s): LIPASE, AMYLASE in the last 168 hours. ?No results for input(s): AMMONIA in the last 168 hours. ?Coagulation Profile: ?No results for input(s): INR, PROTIME in the last 168 hours. ?Cardiac Enzymes: ?Recent Labs  ?Lab 10/02/21 ?0500  ?CKTOTAL 129  ? ?BNP (last 3 results) ?No results for input(s): PROBNP in the last 8760 hours. ?HbA1C: ?No results for input(s): HGBA1C in the last 72 hours. ?CBG: ?Recent Labs   ?Lab 10/02/21 ?1559 10/02/21 ?2356 10/03/21 ?[id number] 10/03/21 ?0750 10/03/21 ?1142  ?GLUCAP 132* 118* 149* 153* 141*  ? ?Lipid Profile: ?Recent Labs  ?  10/01/21 ?0636 10/02/21 ?0434  ?TRIG 82 99  ? ?Thyroid Function Tests: ?No results for input(s): TSH, T4TOTAL, FREET4, T3FREE, THYROIDAB in the last 72 hours. ?Anemia Panel: ?No results for input(s): VITAMINB12, FOLATE, FERRITIN, TIBC, IRON, RETICCTPCT in the last 72 hours. ?Sepsis Labs: ?No results for input(s): PROCALCITON, LATICACIDVEN in the last 168 hours. ? ?No results found for this or any previous visit (from the past 240 hour(s)).  ? ? ? ? ? ?Radiology Studies: ?No results found. ? ? ? ? ? ?Scheduled Meds: ? Chlorhexidine Gluconate Cloth  6 each Topical Daily  ? cloNIDine  0.1 mg Transdermal Weekly  ? enoxaparin (LOVENOX) injection  40 mg Subcutaneous [id number]  ? folic acid  1 mg Intravenous Daily  ? haloperidol decanoate  200 mg Intramuscular Q30 days  ? insulin aspart  0-6 Units Subcutaneous Q6H  ? levothyroxine  75 mcg Intravenous Daily  ? mouth rinse  15 mL Mouth Rinse BID  ? OLANZapine  15 mg Intramuscular BID  ? sodium chloride flush  10-40 mL Intracatheter Q12H  ? ?Continuous Infusions: ? sodium chloride Stopped (09/27/21 2126)  ? TPN ADULT (ION) 115 mL/hr at 10/02/21 1727  ? TPN ADULT (ION)    ? valproate sodium 500 mg (10/03/21 0854)  ? ? ?Assessment & Plan: ?  ?Principal Problem: ?  Acute encephalopathy ?Active Problems: ?  Aspiration pneumonia ([place]) ?  Schizophrenia ([place]) ?  Hypernatremia ?  Impaired nutrition ?  Protein-calorie malnutrition, severe ?  Seizure ([place]) ?  HTN (hypertension) ?  Acute conjunctivitis, right eye ?  Diabetes ([place]) ?  Hyperlipidemia ?  Hypothyroid ?  Noncompliance ?  Hyperkalemia ? ? ?Acute encephalopathy ?Initial medical work-up largely unremarkable as below: ?No CO2 retention on VBG ?CMP, CBC, TSH, ammonia level, flu and COVID-19 RT-PCR all unremarkable. ?Chest x-ray and CT head without acute abnormalities. ?Urine studies are  pending.   ?Depakote was changed to IV.   ?Patient started on Zyprexa intramuscularly.  Patient also given a dose of Haldol decanoate. ? -Patient became more unresponsive on 3/16.  He was noted to be febrile.  Concern was for aspiration pneumonia since patient developed a cough.  COVID-19 test was previously negative.   ?He was also found to have elevated valproic acid level raising concern for valproic acid toxicity.  Ammonia level was also elevated thought to be from Depakote as well.  Depakote dosage was adjusted.  He is on Keppra but had very low dose, now d/c'd.   ?Chest x-ray was repeated.  ABG was done which showed elevated  PCO2 with normal pH suggesting chronic respiratory failure with compensation.  CT head was also repeated which does not show any new findings.  Depakote level remains normal.  ?MRI brain does not show any acute findings.  ?EEG is showed only diffuse slowing, no seizure activity. ?  ?--Psychiatry continues to follow - considering ECT if consent is obtained. ?--Neurology consulted due to lack of improvement - now signed off   ?--Psych resumed olanzapine after no changes with it off ?--Follow up psych and neurology recommendations  ?--Delirium precautions ?--Sitter as needed for safety ?--Neurochecks ?3/29 Sleeping for me today as its my first day with him. No response.  ?psychiatry plan for inpatient ECT. ?  ?  ?Aspiration pneumonia ([place]) ?Acute respiratory failure with hypoxia due to pneumonia- resolved ?Respiratory status overall stable, on room air.   Lungs clear.  Patient does appear to have increased work of breathing which at this point seems related to his metabolic encephalopathy. ?3/29 completed Unasyn for 7-day course  ? ?  ?  ?  ?Protein-calorie malnutrition, severe ?Due to acute illness as evidenced by moderate fat depletion, moderate muscle depletion, severe muscle depletion, 5% weight loss in 1 week, energy intake less than 50% for greater than 5 days. ?--Appreciate dietitian  recommendations ?--Unable to place NG tube ?  ?3/25: Guardian consented for tube feeds, understanding restraints may be needed to prevent pt pulling out the tube ?3/29 PICC placed 3/26, TPN initiated  ?On IV

## 2021-10-04 ENCOUNTER — Other Ambulatory Visit: Payer: Self-pay | Admitting: Psychiatry

## 2021-10-04 DIAGNOSIS — G9340 Encephalopathy, unspecified: Secondary | ICD-10-CM | POA: Diagnosis not present

## 2021-10-04 LAB — GLUCOSE, CAPILLARY
Glucose-Capillary: 145 mg/dL — ABNORMAL HIGH (ref 70–99)
Glucose-Capillary: 175 mg/dL — ABNORMAL HIGH (ref 70–99)

## 2021-10-04 LAB — COMPREHENSIVE METABOLIC PANEL
ALT: 34 U/L (ref 0–44)
AST: 31 U/L (ref 15–41)
Albumin: 2.6 g/dL — ABNORMAL LOW (ref 3.5–5.0)
Alkaline Phosphatase: 46 U/L (ref 38–126)
Anion gap: 6 (ref 5–15)
BUN: 24 mg/dL — ABNORMAL HIGH (ref 8–23)
CO2: 28 mmol/L (ref 22–32)
Calcium: 8.4 mg/dL — ABNORMAL LOW (ref 8.9–10.3)
Chloride: 105 mmol/L (ref 98–111)
Creatinine, Ser: 0.76 mg/dL (ref 0.61–1.24)
GFR, Estimated: >60 mL/min (ref >60)
Glucose, Bld: 166 mg/dL — ABNORMAL HIGH (ref 70–99)
Potassium: 4.2 mmol/L (ref 3.5–5.1)
Sodium: 139 mmol/L (ref 135–145)
Total Bilirubin: 0.5 mg/dL (ref 0.3–1.2)
Total Protein: 6.5 g/dL (ref 6.5–8.1)

## 2021-10-04 LAB — CBC
HCT: 33.5 % — ABNORMAL LOW (ref 39.0–52.0)
Hemoglobin: 10.8 g/dL — ABNORMAL LOW (ref 13.0–17.0)
MCH: 27.9 pg (ref 26.0–34.0)
MCHC: 32.2 g/dL (ref 30.0–36.0)
MCV: 86.6 fL (ref 80.0–100.0)
Platelets: 423 10*3/uL — ABNORMAL HIGH (ref 150–400)
RBC: 3.87 MIL/uL — ABNORMAL LOW (ref 4.22–5.81)
RDW: 14.4 % (ref 11.5–15.5)
WBC: 7 10*3/uL (ref 4.0–10.5)
nRBC: 0 % (ref 0.0–0.2)

## 2021-10-04 LAB — PHOSPHORUS: Phosphorus: 3.2 mg/dL (ref 2.5–4.6)

## 2021-10-04 LAB — MAGNESIUM: Magnesium: 1.8 mg/dL (ref 1.7–2.4)

## 2021-10-04 MED ORDER — TRACE MINERALS CU-MN-SE-ZN 300-55-60-3000 MCG/ML IV SOLN
INTRAVENOUS | Status: AC
  Filled 2021-10-04: qty 960

## 2021-10-04 NOTE — Progress Notes (Signed)
PHARMACY - TOTAL PARENTERAL NUTRITION CONSULT NOTE  ? ?Indication:  Intolerance to enteral feeding ? ?Patient Measurements: ?Height: 6\' 3"  (190.5 cm) ?Weight: 87.4 kg (192 lb 10.9 oz) ?IBW/kg (Calculated) : 84.5 ?TPN AdjBW (KG): 97.1 ?Body mass index is 24.08 kg/m?. ? ?Assessment: 66 year old male with medical history significant for schizophrenia, hypertension, hyperlipidemia, hypothyroid, medication noncompliance, recurrent seizures, tardive dyskinesia, was brought in from ALF by EMS to ED with altered mental status. Pharmacy has been consulted for TPN.  ? ?Glucose / Insulin: BG 143-149, 0 u insulin required in past 24 hrs ?Electrolytes: WNL ?Renal: Scr <1 ?Hepatic: WNL ?Intake / Output; MIVF: +3.9 L ?GI Imaging: ?GI Surgeries / Procedures:  ? ?Central access: PICC line placed 3/26 ?TPN start date: 3/27 ? ?Nutritional Goals: ?Goal TPN rate is 100 mL/hr (provides 144 g of protein and 2803 kcals per day) ? ?RD Assessment: ?Estimated Needs ?Total Energy Estimated Needs: [id number] ?Total Protein Estimated Needs: 130-145 grams ?Total Fluid Estimated Needs: > 2 L ? ?Current Nutrition:  ?NPO ? ?Plan:  ?adjust TPN rate to 100 mL/hr at 1800 (total volume including overfill 2500 mL) ?Nutritional components ?Amino acids (using 15% Clinisol): 144 grams ?Dextrose: 408 grams ?Lipids (using 20% SMOFlipids): 84 grams ?kCal: 2803 ?Electrolytes in TPN: Na 25mEq/L, K 66mEq/L, Ca 82mEq/L, Mg 75mEq/L, and Phos 11mmol/L. Cl:Ac 1:1 ?Add standard MVI and trace elements to TPN ?Add thiamine 100 mg x 5 days (day 4/5) ?not requiring insulin: stop SSI ?Monitor TPN labs on Mon/Thurs ? ?12m ?10/04/2021,7:32 AM ? ?

## 2021-10-04 NOTE — Progress Notes (Signed)
Patient agitated will recheck when calmer.  Safety sitter at bedside. ? 10/04/21 1537  ?Vitals  ?Temp 98.1 ?F (36.7 ?C)  ?BP (!) 170/107  ?MAP (mmHg) 121  ?BP Location Left Arm  ?BP Method Automatic  ?Patient Position (if appropriate) Lying  ?Pulse Rate (!) 118  ?Pulse Rate Source Monitor  ?Resp 17  ?MEWS COLOR  ?MEWS Score Color Yellow  ?Oxygen Therapy  ?SpO2 100 %  ?MEWS Score  ?MEWS Temp 0  ?MEWS Systolic 0  ?MEWS Pulse 2  ?MEWS RR 0  ?MEWS LOC 0  ?MEWS Score 2  ? ? ?

## 2021-10-04 NOTE — Progress Notes (Signed)
?PROGRESS NOTE ? ? ? ?Charlesetta IvoryFinley Mergen  [id number] DOB: 1956-01-31 DOA: 09/17/2021 ?PCP: Koren BoundMatrone, Andrew, NP  ? ? ?Brief Narrative:  ?66 year old male with medical history significant for schizophrenia, hypertension, hyperlipidemia, hypothyroid, medication noncompliance, recurrent seizures, tardive dyskinesia, was brought in from ALF by EMS to ED with altered mental status after he was found to be somnolent, not responding to questions as typical and with right eye drainage.  EMS reported that patient was found on the floor by staff at ALF on 3/13 morning.  Per EDP note, patient was not taking his medications.  In ED, agitated and combative and received IV Haldol 5 mg x 2 and Ativan 2 mg IV x2 on night of arrival.  Admitted for acute encephalopathy likely due to his uncontrolled primary psychiatric disorder i.e. schizophrenia.   ?  ?Psychiatry and neurology consulted.  Patient was given long-acting Haldol.  Depakote level was noted to be elevated and Depakote dose was changed.   ?  ?3/21: Patient's mentation worsened over few days.  Was noted to be febrile with new cough.  Started on antibiotics for possible aspiration pneumonia, completed course of Unasyn.  SLP evaluated swallow and deemed patient severe aspiration risk, patient made NPO.  Dietitian following.   Unable to place NG tube, pt does not swallow on command.  Now on TPN, started 3/27.  ?  ?3/22 >> persistent severe encephalopathy, apneic spells but has been overall stable. ?  ?3/28: stable, slight improvement since starting TPN and IV thiamine. He made eye contact today and attempted to speak to me (mumbled), first interaction I've seen from him, although it was brief ? ?3/29 eyes closed. Snoring loudly. Does not open eyes during exam.  ?3/30 interactive today. Calm.  ? ?Consultants:  ?psychiatry ? ?Procedures:  ? ?Antimicrobials:  ?  ? ? ?Subjective: ?Has no complaints. No sob , cp, dizziness ? ?Objective: ?Vitals:  ? 10/04/21 0500 10/04/21 0721 10/04/21  1113 10/04/21 1126  ?BP:  (!) 145/82 128/83   ?Pulse:  (!) 110 (!) 111 (!) 104  ?Resp:  17 17   ?Temp:  99.1 ?F (37.3 ?C) 98.9 ?F (37.2 ?C)   ?TempSrc:  Oral Oral   ?SpO2:  97% 97%   ?Weight: 87.4 kg     ?Height:      ? ? ?Intake/Output Summary (Last 24 hours) at 10/04/2021 1201 ?Last data filed at 10/04/2021 0400 ?Gross per 24 hour  ?Intake 3924.64 ml  ?Output 400 ml  ?Net 3524.64 ml  ? ?Filed Weights  ? 10/02/21 0500 10/03/21 0406 10/04/21 0500  ?Weight: 87.7 kg 88.3 kg 87.4 kg  ? ? ?Examination: ?Calm, NAD ?Cta no w/r ?Reg s1/s2 no gallop ?Soft benign +bs ?No edema, UE in mittens ?Aaoxox3  ?Mood and affect appropriate in current setting  ? ? ? ?Data Reviewed: I have personally reviewed following labs and imaging studies ? ?CBC: ?Recent Labs  ?Lab 09/30/21 ?0414 10/04/21 ?0325  ?WBC 7.2 7.0  ?HGB 13.1 10.8*  ?HCT 40.7 33.5*  ?MCV 83.6 86.6  ?PLT 423* 423*  ? ?Basic Metabolic Panel: ?Recent Labs  ?Lab 09/30/21 ?0414 10/01/21 ?[id number] 10/02/21 ?0434 10/03/21 ?0300 10/04/21 ?0434  ?NA 143 140 141 140 139  ?K 5.5* 3.8 4.0 4.1 4.2  ?CL 104 101 101 104 105  ?CO2 32 32 31 29 28   ?GLUCOSE 128* 130* 137* 143* 166*  ?BUN 16 15 17 21  24*  ?CREATININE 0.97 0.83 0.74 0.79 0.76  ?CALCIUM 8.6* 8.3* 8.6* 8.4* 8.4*  ?MG 2.0  1.9 2.0 1.9 1.8  ?PHOS 3.3 3.6 3.6 3.3 3.2  ? ?GFR: ?Estimated Creatinine Clearance: 108.6 mL/min (by C-G formula based on SCr of 0.76 mg/dL). ?Liver Function Tests: ?Recent Labs  ?Lab 10/01/21 ?2595 10/02/21 ?6387 10/04/21 ?0434  ?AST 39 37 31  ?ALT 38 42 34  ?ALKPHOS 57 55 46  ?BILITOT 0.9 0.8 0.5  ?PROT 6.8 7.0 6.5  ?ALBUMIN 2.7* 2.7* 2.6*  ? ?No results for input(s): LIPASE, AMYLASE in the last 168 hours. ?No results for input(s): AMMONIA in the last 168 hours. ?Coagulation Profile: ?No results for input(s): INR, PROTIME in the last 168 hours. ?Cardiac Enzymes: ?Recent Labs  ?Lab 10/02/21 ?0500  ?CKTOTAL 129  ? ?BNP (last 3 results) ?No results for input(s): PROBNP in the last 8760 hours. ?HbA1C: ?No results for  input(s): HGBA1C in the last 72 hours. ?CBG: ?Recent Labs  ?Lab 10/03/21 ?1647 10/03/21 ?1735 10/03/21 ?2336 10/04/21 ?5643 10/04/21 ?3295  ?GLUCAP 163* 150* 137* 145* 175*  ? ?Lipid Profile: ?Recent Labs  ?  10/02/21 ?0434  ?TRIG 99  ? ?Thyroid Function Tests: ?No results for input(s): TSH, T4TOTAL, FREET4, T3FREE, THYROIDAB in the last 72 hours. ?Anemia Panel: ?No results for input(s): VITAMINB12, FOLATE, FERRITIN, TIBC, IRON, RETICCTPCT in the last 72 hours. ?Sepsis Labs: ?No results for input(s): PROCALCITON, LATICACIDVEN in the last 168 hours. ? ?No results found for this or any previous visit (from the past 240 hour(s)).  ? ? ? ? ? ?Radiology Studies: ?No results found. ? ? ? ? ? ?Scheduled Meds: ? Chlorhexidine Gluconate Cloth  6 each Topical Daily  ? cloNIDine  0.1 mg Transdermal Weekly  ? enoxaparin (LOVENOX) injection  40 mg Subcutaneous Q24H  ? folic acid  1 mg Intravenous Daily  ? haloperidol decanoate  200 mg Intramuscular Q30 days  ? levothyroxine  75 mcg Intravenous Daily  ? mouth rinse  15 mL Mouth Rinse BID  ? OLANZapine  15 mg Intramuscular BID  ? sodium chloride flush  10-40 mL Intracatheter Q12H  ? ?Continuous Infusions: ? sodium chloride Stopped (09/27/21 2126)  ? TPN ADULT (ION) 115 mL/hr at 10/03/21 1800  ? TPN ADULT (ION)    ? ? ?Assessment & Plan: ?  ?Principal Problem: ?  Acute encephalopathy ?Active Problems: ?  Aspiration pneumonia ([facility]) ?  Schizophrenia ([facility]) ?  Hypernatremia ?  Impaired nutrition ?  Protein-calorie malnutrition, severe ?  Seizure ([facility]) ?  HTN (hypertension) ?  Acute conjunctivitis, right eye ?  Diabetes ([facility]) ?  Hyperlipidemia ?  Hypothyroid ?  Noncompliance ?  Hyperkalemia ? ? ?Acute encephalopathy ?Initial medical work-up largely unremarkable as below: ?No CO2 retention on VBG ?CMP, CBC, TSH, ammonia level, flu and COVID-19 RT-PCR all unremarkable. ?Chest x-ray and CT head without acute abnormalities. ?Urine studies are pending.   ?Depakote was changed to IV.    ?Patient started on Zyprexa intramuscularly.  Patient also given a dose of Haldol decanoate. ? -Patient became more unresponsive on 3/16.  He was noted to be febrile.  Concern was for aspiration pneumonia since patient developed a cough.  COVID-19 test was previously negative.   ?He was also found to have elevated valproic acid level raising concern for valproic acid toxicity.  Ammonia level was also elevated thought to be from Depakote as well.  Depakote dosage was adjusted.  He is on Keppra but had very low dose, now d/c'd.   ?Chest x-ray was repeated.  ABG was done which showed elevated PCO2 with normal pH suggesting  chronic respiratory failure with compensation.  CT head was also repeated which does not show any new findings.  Depakote level remains normal.  ?MRI brain does not show any acute findings.  ?EEG is showed only diffuse slowing, no seizure activity. ?--Neurology consulted due to lack of improvement - now signed off   ?--Psych resumed olanzapine after no changes with it off ?--Follow up psych and neurology recommendations  ?--Delirium precautions ?--Sitter as needed for safety ?--Neurochecks ?3/30 today he is interactive.   ?Psychiatry spoke to legal guardian representative at [facility] and discussed ECT treatment.   ?Plan to start electroconvulsive therapy tomorrow  ?He needs to be n.p.o. for ECT  ?Labs/electrolytes unremarkable  ? ?  ?Aspiration pneumonia ([facility]) ?Acute respiratory failure with hypoxia due to pneumonia- resolved ?Respiratory status overall stable, on room air.   Lungs clear.  Patient does appear to have increased work of breathing which at this point seems related to his metabolic encephalopathy. ?3/30 completed Unasyn for 7-day course  ? ? ?  ?  ?  ?Protein-calorie malnutrition, severe ?Due to acute illness as evidenced by moderate fat depletion, moderate muscle depletion, severe muscle depletion, 5% weight loss in 1 week, energy intake less than  50% for greater than 5 days. ?--Appreciate dietitian recommendations ?--Unable to place NG tube ?3/25: Guardian consented for tube feeds, understanding restraints may be needed to prevent pt pulling out the

## 2021-10-04 NOTE — Progress Notes (Signed)
Patient  ? 10/04/21 1537  ?Assess: MEWS Score  ?Temp 98.1 ?F (36.7 ?C)  ?BP (!) 170/107  ?Pulse Rate (!) 118  ?Resp 17  ?SpO2 100 %  ?Assess: MEWS Score  ?MEWS Temp 0  ?MEWS Systolic 0  ?MEWS Pulse 2  ?MEWS RR 0  ?MEWS LOC 0  ?MEWS Score 2  ?MEWS Score Color Yellow  ?Assess: if the MEWS score is Yellow or Red  ?Were vital signs taken at a resting state? No  ?Treat  ?MEWS Interventions Other (Comment) ?(patient agitated, assisted to calm to recheck)  ?Assess: SIRS CRITERIA  ?SIRS Temperature  0  ?SIRS Pulse 1  ?SIRS Respirations  0  ?SIRS WBC 0  ?SIRS Score Sum  1  ? ?Patient agitated.  Will reassess when calmer.  ?

## 2021-10-04 NOTE — Progress Notes (Signed)
?   10/04/21 1126  ?Vitals  ?Pulse Rate (!) 104  ?MEWS COLOR  ?MEWS Score Color Green  ?MEWS Score  ?MEWS Temp 0  ?MEWS Systolic 0  ?MEWS Pulse 1  ?MEWS RR 0  ?MEWS LOC 0  ?MEWS Score 1  ? ?Recheck HR at rest. ?

## 2021-10-05 ENCOUNTER — Ambulatory Visit: Payer: Medicare Other

## 2021-10-05 ENCOUNTER — Encounter: Payer: Self-pay | Admitting: Internal Medicine

## 2021-10-05 ENCOUNTER — Inpatient Hospital Stay: Payer: Medicare Other | Admitting: Anesthesiology

## 2021-10-05 DIAGNOSIS — F203 Undifferentiated schizophrenia: Secondary | ICD-10-CM | POA: Diagnosis not present

## 2021-10-05 DIAGNOSIS — G9340 Encephalopathy, unspecified: Secondary | ICD-10-CM | POA: Diagnosis not present

## 2021-10-05 MED ORDER — METHOHEXITAL SODIUM 100 MG/10ML IV SOSY
PREFILLED_SYRINGE | INTRAVENOUS | Status: DC | PRN
  Administered 2021-10-05: 90 mg via INTRAVENOUS

## 2021-10-05 MED ORDER — SUCCINYLCHOLINE CHLORIDE 200 MG/10ML IV SOSY
PREFILLED_SYRINGE | INTRAVENOUS | Status: AC
  Filled 2021-10-05: qty 10

## 2021-10-05 MED ORDER — METHOHEXITAL SODIUM 0.5 G IJ SOLR
INTRAMUSCULAR | Status: AC
Start: 2021-10-05 — End: ?
  Filled 2021-10-05: qty 500

## 2021-10-05 MED ORDER — SODIUM CHLORIDE 0.9 % IV SOLN
500.0000 mL | Freq: Once | INTRAVENOUS | Status: AC
  Administered 2021-10-05: 500 mL via INTRAVENOUS

## 2021-10-05 MED ORDER — LABETALOL HCL 5 MG/ML IV SOLN
INTRAVENOUS | Status: DC | PRN
  Administered 2021-10-05: 10 mg via INTRAVENOUS

## 2021-10-05 MED ORDER — TRACE MINERALS CU-MN-SE-ZN 300-55-60-3000 MCG/ML IV SOLN
INTRAVENOUS | Status: AC
  Filled 2021-10-05: qty 960

## 2021-10-05 MED ORDER — SUCCINYLCHOLINE CHLORIDE 200 MG/10ML IV SOSY
PREFILLED_SYRINGE | INTRAVENOUS | Status: DC | PRN
  Administered 2021-10-05: 120 mg via INTRAVENOUS

## 2021-10-05 NOTE — H&P (Signed)
[name] is an 66 y.o. male.   ?Chief Complaint: none ?HPI: schizophrenia ? ?Past Medical History:  ?Diagnosis Date  ? Diabetes mellitus without complication (HCC)   ? HTN (hypertension)   ? Hyperammonemia (HCC) While on Depakote  ? Hyperlipemia   ? Lithium toxicity   ? Neutropenia (HCC)   ? Schizophrenia (HCC)   ? Seizure (HCC) While toxic on Depakote  ? Sickle cell trait (HCC)   ? Thyroid disease   ? TIA (transient ischemic attack) in 2009  ? ? ?History reviewed. No pertinent surgical history. ? ?History reviewed. No pertinent family history. ?Social History:  reports that he has never smoked. He has never used smokeless tobacco. He reports that he does not currently use drugs. He reports that he does not drink alcohol. ? ?Allergies:  ?Allergies  ?Allergen Reactions  ? Depakote [Valproic Acid] Other (See Comments)  ?  Reaction:  Hyperammonemia   ? Lithium Other (See Comments)  ?  Pt has a prior history of Lithium toxicity.     ? ? ?Medications Prior to Admission  ?Medication Sig Dispense Refill  ? aspirin EC 81 MG tablet Take 81 mg by mouth daily.    ? benztropine (COGENTIN) 1 MG tablet Take 1 tablet (1 mg total) by mouth 2 (two) times daily with a meal. 60 tablet 2  ? clonazePAM (KLONOPIN) 0.5 MG tablet Take 1 tablet (0.5 mg total) by mouth 2 (two) times daily with a meal. (Patient taking differently: Take 0.5 mg by mouth 2 (two) times daily.) 60 tablet 0  ? divalproex (DEPAKOTE SPRINKLE) 125 MG capsule Take 1,000 mg by mouth 2 (two) times daily.    ? folic acid (FOLVITE) 1 MG tablet Take 1 mg by mouth daily.    ? haloperidol (HALDOL) 20 MG tablet Take 20 mg by mouth 2 (two) times daily.    ? hydrOXYzine (ATARAX) 50 MG tablet Take 50 mg by mouth 2 (two) times daily as needed for anxiety.    ? levETIRAcetam (KEPPRA) 250 MG tablet Take 250 mg by mouth 2 (two) times daily.    ? levothyroxine (SYNTHROID, LEVOTHROID) 125 MCG tablet Take 1 tablet (125 mcg total) by mouth daily before breakfast. 30 tablet 0  ?  metFORMIN (GLUCOPHAGE) 1000 MG tablet Take 1 tablet (1,000 mg total) by mouth 2 (two) times daily with a meal. 60 tablet 0  ? OLANZapine zydis (ZYPREXA) 20 MG disintegrating tablet Take 1 tablet (20 mg total) by mouth 2 (two) times daily with a meal. (Patient taking differently: Take 20 mg by mouth in the morning.) 60 tablet 2  ? simvastatin (ZOCOR) 20 MG tablet Take 20 mg by mouth at bedtime.    ? traZODone (DESYREL) 150 MG tablet Take 150 mg by mouth at bedtime.    ? vitamin B-12 (CYANOCOBALAMIN) 1000 MCG tablet Take 1,000 mcg by mouth daily.    ? ? ?Results for orders placed or performed during the hospital encounter of 09/17/21 (from the past 48 hour(s))  ?Glucose, capillary     Status: Abnormal  ? Collection Time: 10/03/21  4:47 PM  ?Result Value Ref Range  ? Glucose-Capillary 163 (H) 70 - 99 mg/dL  ?  Comment: Glucose reference range applies only to samples taken after fasting for at least 8 hours.  ?Glucose, capillary     Status: Abnormal  ? Collection Time: 10/03/21  5:35 PM  ?Result Value Ref Range  ? Glucose-Capillary 150 (H) 70 - 99 mg/dL  ?  Comment: Glucose  reference range applies only to samples taken after fasting for at least 8 hours.  ?Glucose, capillary     Status: Abnormal  ? Collection Time: 10/03/21 11:36 PM  ?Result Value Ref Range  ? Glucose-Capillary 137 (H) 70 - 99 mg/dL  ?  Comment: Glucose reference range applies only to samples taken after fasting for at least 8 hours.  ?CBC     Status: Abnormal  ? Collection Time: 10/04/21  3:25 AM  ?Result Value Ref Range  ? WBC 7.0 4.0 - 10.5 K/uL  ? RBC 3.87 (L) 4.22 - 5.81 MIL/uL  ? Hemoglobin 10.8 (L) 13.0 - 17.0 g/dL  ? HCT 33.5 (L) 39.0 - 52.0 %  ? MCV 86.6 80.0 - 100.0 fL  ? MCH 27.9 26.0 - 34.0 pg  ? MCHC 32.2 30.0 - 36.0 g/dL  ? RDW 14.4 11.5 - 15.5 %  ? Platelets 423 (H) 150 - 400 K/uL  ? nRBC 0.0 0.0 - 0.2 %  ?  Comment: Performed at [facility], [address] [place], [place] [address]  ?Comprehensive metabolic panel     Status:  Abnormal  ? Collection Time: 10/04/21  4:34 AM  ?Result Value Ref Range  ? Sodium 139 135 - 145 mmol/L  ? Potassium 4.2 3.5 - 5.1 mmol/L  ? Chloride 105 98 - 111 mmol/L  ? CO2 28 22 - 32 mmol/L  ? Glucose, Bld 166 (H) 70 - 99 mg/dL  ?  Comment: Glucose reference range applies only to samples taken after fasting for at least 8 hours.  ? BUN 24 (H) 8 - 23 mg/dL  ? Creatinine, Ser 0.76 0.61 - 1.24 mg/dL  ? Calcium 8.4 (L) 8.9 - 10.3 mg/dL  ? Total Protein 6.5 6.5 - 8.1 g/dL  ? Albumin 2.6 (L) 3.5 - 5.0 g/dL  ? AST 31 15 - 41 U/L  ? ALT 34 0 - 44 U/L  ? Alkaline Phosphatase 46 38 - 126 U/L  ? Total Bilirubin 0.5 0.3 - 1.2 mg/dL  ? GFR, Estimated >60 >60 mL/min  ?  Comment: (NOTE) ?Calculated using the CKD-EPI Creatinine Equation (2021) ?  ? Anion gap 6 5 - 15  ?  Comment: Performed at [facility], [address] [place], [place] [address]  ?Magnesium     Status: None  ? Collection Time: 10/04/21  4:34 AM  ?Result Value Ref Range  ? Magnesium 1.8 1.7 - 2.4 mg/dL  ?  Comment: Performed at [facility], [address] [place], [place] [address]  ?Phosphorus     Status: None  ? Collection Time: 10/04/21  4:34 AM  ?Result Value Ref Range  ? Phosphorus 3.2 2.5 - 4.6 mg/dL  ?  Comment: Performed at [facility], [address] [place], [place] [address]  ?Glucose, capillary     Status: Abnormal  ? Collection Time: 10/04/21  6:23 AM  ?Result Value Ref Range  ? Glucose-Capillary 145 (H) 70 - 99 mg/dL  ?  Comment: Glucose reference range applies only to samples taken after fasting for at least 8 hours.  ?Glucose, capillary     Status: Abnormal  ? Collection Time: 10/04/21  8:38 AM  ?Result Value Ref Range  ? Glucose-Capillary 175 (H) 70 - 99 mg/dL  ?  Comment: Glucose reference range applies only to samples taken after fasting for at least 8 hours.  ? ?No results found. ? ?Review of Systems  ?Constitutional: Negative.   ?HENT: Negative.    ?  Eyes: Negative.   ?Respiratory: Negative.     ?Cardiovascular: Negative.   ?Gastrointestinal: Negative.   ?Musculoskeletal: Negative.   ?Skin: Negative.   ?Neurological: Negative.   ?Psychiatric/Behavioral: Negative.    ?All other systems reviewed and are negative. ? ?Blood pressure (!) 123/110, pulse 96, temperature 97.7 ?F (36.5 ?C), temperature source Tympanic, resp. rate 18, height 6\' 3"  (1.905 m), weight 87.4 kg, SpO2 98 %. ?Physical Exam ?Constitutional:   ?   Appearance: He is well-developed.  ?HENT:  ?   Head: Normocephalic and atraumatic.  ?Eyes:  ?   Conjunctiva/sclera: Conjunctivae normal.  ?   Pupils: Pupils are equal, round, and reactive to light.  ?Cardiovascular:  ?   Heart sounds: Normal heart sounds.  ?Pulmonary:  ?   Effort: Pulmonary effort is normal.  ?Abdominal:  ?   Palpations: Abdomen is soft.  ?Musculoskeletal:     ?   General: Normal range of motion.  ?   Cervical back: Normal range of motion.  ?Skin: ?   General: Skin is warm and dry.  ?Neurological:  ?   General: No focal deficit present.  ?   Mental Status: He is alert.  ?Psychiatric:     ?   Attention and Perception: He is inattentive.     ?   Mood and Affect: Affect is blunt.     ?   Speech: He is noncommunicative.  ?  ? ?Assessment/Plan ?Begin ect tx ? ? , MD ?10/05/2021, 12:13 PM ? ? ? ?

## 2021-10-05 NOTE — Transfer of Care (Signed)
Immediate Anesthesia Transfer of Care Note ? ?Patient: [name] ? ?Procedure(s) Performed: ECT TX ? ?Patient Location: PACU ? ?Anesthesia Type:General ? ?Level of Consciousness: drowsy and patient cooperative ? ?Airway & Oxygen Therapy: Patient Spontanous Breathing ? ?Post-op Assessment: Report given to RN and Post -op Vital signs reviewed and stable ? ?Post vital signs: Reviewed and stable ? ?Last Vitals:  ?Vitals Value Taken Time  ?BP 137/86 10/05/21 1256  ?Temp 37.2 ?C 10/05/21 1256  ?Pulse 103 10/05/21 1259  ?Resp 27 10/05/21 1259  ?SpO2 100 % 10/05/21 1259  ?Vitals shown include unvalidated device data. ? ?Last Pain:  ?Vitals:  ? 10/05/21 1121  ?TempSrc:   ?PainSc: 0-No pain  ?   ? ?Patients Stated Pain Goal: 0 (10/03/21 0800) ? ?Complications: No notable events documented. ?

## 2021-10-05 NOTE — Progress Notes (Signed)
Patient returned to floor from ECT, drowsy but responds to voice.  Opens eye and looks at RN asks where's the basketball, then back to sleep.  VSS. Sitter at bedside for safety and bed alarm in use. ?

## 2021-10-05 NOTE — Progress Notes (Signed)
?PROGRESS NOTE ? ? ? ?Levian Arrieta  [id number] DOB: [birth year] DOA: 09/17/2021 ?PCP: Terrill Mohr, NP  ? ? ?Brief Narrative:  ?66 year old male with medical history significant for schizophrenia, hypertension, hyperlipidemia, hypothyroid, medication noncompliance, recurrent seizures, tardive dyskinesia, was brought in from ALF by EMS to ED with altered mental status after he was found to be somnolent, not responding to questions as typical and with right eye drainage.  EMS reported that patient was found on the floor by staff at ALF on 3/13 morning.  Per EDP note, patient was not taking his medications.  In ED, agitated and combative and received IV Haldol 5 mg x 2 and Ativan 2 mg IV x2 on night of arrival.  Admitted for acute encephalopathy likely due to his uncontrolled primary psychiatric disorder i.e. schizophrenia.   ?  ?Psychiatry and neurology consulted.  Patient was given long-acting Haldol.  Depakote level was noted to be elevated and Depakote dose was changed.   ?  ?3/21: Patient's mentation worsened over few days.  Was noted to be febrile with new cough.  Started on antibiotics for possible aspiration pneumonia, completed course of Unasyn.  SLP evaluated swallow and deemed patient severe aspiration risk, patient made NPO.  Dietitian following.   Unable to place NG tube, pt does not swallow on command.  Now on TPN, started 3/27.  ?  ?3/22 >> persistent severe encephalopathy, apneic spells but has been overall stable. ?  ?3/28: stable, slight improvement since starting TPN and IV thiamine. He made eye contact today and attempted to speak to me (mumbled), first interaction I've seen from him, although it was brief ? ?3/29 eyes closed. Snoring loudly. Does not open eyes during exam.  ?3/30 interactive today. Calm.  ?3/31 non interactive with me this am. Plan for ECT today ? ?Consultants:  ?psychiatry ? ?Procedures:  ? ?Antimicrobials:  ?  ? ? ?Subjective: ?Does not respond to my  questions ? ?Objective: ?Vitals:  ? 10/05/21 1310 10/05/21 1320 10/05/21 1330 10/05/21 1340  ?BP: (!) 156/89 (!) 128/92 (!) 146/58   ?Pulse: (!) 103 (!) 102 (!) 101 89  ?Resp: 17 (!) 21 (!) 21 16  ?Temp:   98.9 ?F (37.2 ?C)   ?TempSrc:      ?SpO2: 100% 100% 100% 100%  ?Weight:      ?Height:      ? ? ?Intake/Output Summary (Last 24 hours) at 10/05/2021 1344 ?Last data filed at 10/05/2021 1342 ?Gross per 24 hour  ?Intake 2283.34 ml  ?Output 300 ml  ?Net 1983.34 ml  ? ?Filed Weights  ? 10/03/21 0406 10/04/21 0500 10/05/21 1117  ?Weight: 88.3 kg 87.4 kg 87.4 kg  ? ? ?Examination: ?Calm, NAD ?Cta no w/r ?Reg s1/s2 no gallop ?Soft benign +bs ?No edema ?Awake but not interactive  ?Mood and affect appropriate in current setting  ? ? ? ?Data Reviewed: I have personally reviewed following labs and imaging studies ? ?CBC: ?Recent Labs  ?Lab 09/30/21 ?0414 10/04/21 ?0325  ?WBC 7.2 7.0  ?HGB 13.1 10.8*  ?HCT 40.7 33.5*  ?MCV 83.6 86.6  ?PLT 423* 423*  ? ?Basic Metabolic Panel: ?Recent Labs  ?Lab 09/30/21 ?0414 10/01/21 ?[id number] 10/02/21 ?0434 10/03/21 ?0300 10/04/21 ?0434  ?NA 143 140 141 140 139  ?K 5.5* 3.8 4.0 4.1 4.2  ?CL 104 101 101 104 105  ?CO2 32 32 31 29 28   ?GLUCOSE 128* 130* 137* 143* 166*  ?BUN 16 15 17 21  24*  ?CREATININE 0.97 0.83 0.74 0.79 0.76  ?  CALCIUM 8.6* 8.3* 8.6* 8.4* 8.4*  ?MG 2.0 1.9 2.0 1.9 1.8  ?PHOS 3.3 3.6 3.6 3.3 3.2  ? ?GFR: ?Estimated Creatinine Clearance: 108.6 mL/min (by C-G formula based on SCr of 0.76 mg/dL). ?Liver Function Tests: ?Recent Labs  ?Lab 10/01/21 ?[id number] 10/02/21 ?[id number] 10/04/21 ?0434  ?AST 39 37 31  ?ALT 38 42 34  ?ALKPHOS 57 55 46  ?BILITOT 0.9 0.8 0.5  ?PROT 6.8 7.0 6.5  ?ALBUMIN 2.7* 2.7* 2.6*  ? ?No results for input(s): LIPASE, AMYLASE in the last 168 hours. ?No results for input(s): AMMONIA in the last 168 hours. ?Coagulation Profile: ?No results for input(s): INR, PROTIME in the last 168 hours. ?Cardiac Enzymes: ?Recent Labs  ?Lab 10/02/21 ?0500  ?CKTOTAL 129  ? ?BNP (last 3  results) ?No results for input(s): PROBNP in the last 8760 hours. ?HbA1C: ?No results for input(s): HGBA1C in the last 72 hours. ?CBG: ?Recent Labs  ?Lab 10/03/21 ?1647 10/03/21 ?1735 10/03/21 ?2336 10/04/21 ?[id number] 10/04/21 ?0838  ?GLUCAP 163* 150* 137* 145* 175*  ? ?Lipid Profile: ?No results for input(s): CHOL, HDL, LDLCALC, TRIG, CHOLHDL, LDLDIRECT in the last 72 hours. ? ?Thyroid Function Tests: ?No results for input(s): TSH, T4TOTAL, FREET4, T3FREE, THYROIDAB in the last 72 hours. ?Anemia Panel: ?No results for input(s): VITAMINB12, FOLATE, FERRITIN, TIBC, IRON, RETICCTPCT in the last 72 hours. ?Sepsis Labs: ?No results for input(s): PROCALCITON, LATICACIDVEN in the last 168 hours. ? ?No results found for this or any previous visit (from the past 240 hour(s)).  ? ? ? ? ? ?Radiology Studies: ?No results found. ? ? ? ? ? ?Scheduled Meds: ? Chlorhexidine Gluconate Cloth  6 each Topical Daily  ? cloNIDine  0.1 mg Transdermal Weekly  ? enoxaparin (LOVENOX) injection  40 mg Subcutaneous [id number]  ? folic acid  1 mg Intravenous Daily  ? haloperidol decanoate  200 mg Intramuscular Q30 days  ? levothyroxine  75 mcg Intravenous Daily  ? mouth rinse  15 mL Mouth Rinse BID  ? OLANZapine  15 mg Intramuscular BID  ? sodium chloride flush  10-40 mL Intracatheter Q12H  ? ?Continuous Infusions: ? sodium chloride 0 mL/hr at 09/27/21 2126  ? TPN ADULT (ION) 100 mL/hr at 10/05/21 1150  ? TPN ADULT (ION)    ? ? ?Assessment & Plan: ?  ?Principal Problem: ?  Acute encephalopathy ?Active Problems: ?  Aspiration pneumonia ([place]) ?  Schizophrenia ([place]) ?  Hypernatremia ?  Impaired nutrition ?  Protein-calorie malnutrition, severe ?  Seizure ([place]) ?  HTN (hypertension) ?  Acute conjunctivitis, right eye ?  Diabetes ([place]) ?  Hyperlipidemia ?  Hypothyroid ?  Noncompliance ?  Hyperkalemia ? ? ?Acute encephalopathy ?Initial medical work-up largely unremarkable as below: ?No CO2 retention on VBG ?CMP, CBC, TSH, ammonia level, flu and COVID-19  RT-PCR all unremarkable. ?Chest x-ray and CT head without acute abnormalities. ?Urine studies are pending.   ?Depakote was changed to IV.   ?Patient started on Zyprexa intramuscularly.  Patient also given a dose of Haldol decanoate. ? -Patient became more unresponsive on 3/16.  He was noted to be febrile.  Concern was for aspiration pneumonia since patient developed a cough.  COVID-19 test was previously negative.   ?He was also found to have elevated valproic acid level raising concern for valproic acid toxicity.  Ammonia level was also elevated thought to be from Depakote as well.  Depakote dosage was adjusted.  He is on Keppra but had very low dose, now d/c'd.   ?Chest  x-ray was repeated.  ABG was done which showed elevated PCO2 with normal pH suggesting chronic respiratory failure with compensation.  CT head was also repeated which does not show any new findings.  Depakote level remains normal.  ?MRI brain does not show any acute findings.  ?EEG is showed only diffuse slowing, no seizure activity. ?--Neurology consulted due to lack of improvement - now signed off   ?--Psych resumed olanzapine after no changes with it off ?--Follow up psych and neurology recommendations  ?--Delirium precautions ?--Sitter as needed for safety ?--Neurochecks ?Psychiatry spoke to legal guardian representative at [place] and discussed ECT treatment.   ?Plan to start electroconvulsive therapy tomorrow  ?3/31 plan for ECT today by psychiatry  ? ? ?  ?Aspiration pneumonia ([place]) ?Acute respiratory failure with hypoxia due to pneumonia- resolved ?Respiratory status overall stable, on room air.   Lungs clear.  Patient does appear to have increased work of breathing which at this point seems related to his metabolic encephalopathy. ?3/31 completed Unasyn for 7-day course  ? ? ? ? ?  ?  ?  ?Protein-calorie malnutrition, severe ?Due to acute illness as evidenced by moderate fat depletion, moderate muscle  depletion, severe muscle depletion, 5% weight loss in 1 week, energy intake less than 50% for greater than 5 days. ?--Appreciate dietitian recommendations ?--Unable to place NG tube ?3/25: Guardian consented for tube feeds, understandin

## 2021-10-05 NOTE — TOC Progression Note (Signed)
Transition of Care (TOC) - Progression Note  ? ? ?Patient Details  ?Name: [name] ?MRN: [id number] ?Date of Birth: 1956-06-20 ? ?Transition of Care (TOC) CM/SW Contact  ?Chapman Fitch, RN ?Phone Number: ?10/05/2021, 9:06 AM ? ?Clinical Narrative:    ?Plans for ECT to start today.  Discharge disposition note clear at this time ? ? ?Expected Discharge Plan: Group Home ?Barriers to Discharge: Continued Medical Work up ? ?Expected Discharge Plan and Services ?Expected Discharge Plan: Group Home ?  ?Discharge Planning Services: CM Consult ?Post Acute Care Choice: Home Health ?Living arrangements for the past 2 months: Group Home ?                ?  ?  ?  ?  ?  ?  ?  ?  ?  ?  ? ? ?Social Determinants of Health (SDOH) Interventions ?  ? ?Readmission Risk Interventions ?   ? View : No data to display.  ?  ?  ?  ? ? ?

## 2021-10-05 NOTE — Procedures (Signed)
ECT SERVICES ?Physician?s Interval Evaluation & Treatment Note ? ?Patient Identification: [name] ?MRN:  [id number] ?Date of Evaluation:  10/05/2021 ?[facility] #: 1 ? ?MADRS:  ? ?MMSE:  ? ?P.E. Findings: ? ?Patient is very disheveled has not been bathed in quite a while.  Lost a little bit of weight although he is always on the thin side.  No other specific finding ? ?Psychiatric Interval Note: ? ?Usually pretty minimally interactive.  He will say a couple words but still seems very confused. ? ?Subjective: ? ?Patient is a 66 y.o. male seen for evaluation for Electroconvulsive Therapy. ?Does not make a complaint ? ?Treatment Summary: ? ? []   Right Unilateral             [x]  Bilateral ?  ?% Energy : 1.0 ms 80%.  We had to do 6 to stimulations.  40% did not produce a seizure ? ? Impedance: 400 ohms ? ?Seizure Energy Index: 521 ?V squared ? ?Postictal Suppression Index: Less than 10% ? ?Seizure Concordance Index: 85% ? ?Medications ? ?Pre Shock: Brevital 90 mg succinylcholine 120 mg ? ?Post Shock:   ? ?Seizure Duration: 10 seconds EMG 26 seconds EEG ? ? ?Comments: ?Short seizure and extremely low amplitude.  Part of this might be that he is still washing out the Depakote that he was being given for so long.  We should keep him off of any benzodiazepines or anticonvulsants and treat again on Monday.  I will ask medicine if it is okay for him to be eating. ? ?Lungs: ? ?[x]   Clear to auscultation              ? ?[]  Other:  ? ?Heart: ?  ? [x]   Regular rhythm             []  irregular rhythm ? ? ? [x]   Previous H&P reviewed, patient examined and there are NO CHANGES              ?  ? []   Previous H&P reviewed, patient examined and there are changes noted. ? ? ?Alethia Berthold, MD ?3/31/20234:57 PM ? ? ? ?

## 2021-10-05 NOTE — Progress Notes (Signed)
PHARMACY - TOTAL PARENTERAL NUTRITION CONSULT NOTE  ? ?Indication:  Intolerance to enteral feeding ? ?Patient Measurements: ?Height: 6\' 3"  (190.5 cm) ?Weight: 87.4 kg (192 lb 10.9 oz) ?IBW/kg (Calculated) : 84.5 ?TPN AdjBW (KG): 97.1 ?Body mass index is 24.08 kg/m?. ? ?Assessment: 66 year old male with medical history significant for schizophrenia, hypertension, hyperlipidemia, hypothyroid, medication noncompliance, recurrent seizures, tardive dyskinesia, was brought in from ALF by EMS to ED with altered mental status. Pharmacy has been consulted for TPN.  ? ?Glucose / Insulin: stopped insulin d/t no requirement ?Electrolytes: WNL ?Renal: Scr <1, stable ?Hepatic: WNL ?Intake / Output; MIVF: +4.45 L ?GI Imaging: none recently ?GI Surgeries / Procedures: none recently ? ?Central access: PICC line placed 3/26 ?TPN start date: 3/27 ? ?Nutritional Goals: ?Goal TPN rate is 100 mL/hr (provides 144 g of protein and 2803 kcals per day) ? ?RD Assessment: ?Estimated Needs ?Total Energy Estimated Needs: [phone] ?Total Protein Estimated Needs: 130-145 grams ?Total Fluid Estimated Needs: > 2 L ? ?Current Nutrition:  ?NPO ? ?Plan:  ?Continue TPN at 100 mL/hr  (total volume including overfill 2500 mL) ?Nutritional components ?Amino acids (using 15% Clinisol): 144 grams ?Dextrose: 408 grams ?Lipids (using 20% SMOFlipids): 84 grams ?kCal: 2803 ?Electrolytes in TPN: Na 72mEq/L, K 73mEq/L, Ca 22mEq/L, Mg 77mEq/L, and Phos 74mmol/L. Cl:Ac 1:1 ?Add standard MVI and trace elements to TPN ?Add thiamine 100 mg x 5 days (day 5/5) ?Monitor TPN labs on Mon/Thurs ? ?12m ?10/05/2021,7:27 AM ? ?

## 2021-10-06 DIAGNOSIS — G9340 Encephalopathy, unspecified: Secondary | ICD-10-CM | POA: Diagnosis not present

## 2021-10-06 LAB — BASIC METABOLIC PANEL
Anion gap: 8 (ref 5–15)
BUN: 26 mg/dL — ABNORMAL HIGH (ref 8–23)
CO2: 30 mmol/L (ref 22–32)
Calcium: 8.5 mg/dL — ABNORMAL LOW (ref 8.9–10.3)
Chloride: 106 mmol/L (ref 98–111)
Creatinine, Ser: 0.75 mg/dL (ref 0.61–1.24)
GFR, Estimated: >60 mL/min (ref >60)
Glucose, Bld: 246 mg/dL — ABNORMAL HIGH (ref 70–99)
Potassium: 4.2 mmol/L (ref 3.5–5.1)
Sodium: 144 mmol/L (ref 135–145)

## 2021-10-06 LAB — GLUCOSE, CAPILLARY
Glucose-Capillary: 170 mg/dL — ABNORMAL HIGH (ref 70–99)
Glucose-Capillary: 192 mg/dL — ABNORMAL HIGH (ref 70–99)
Glucose-Capillary: 240 mg/dL — ABNORMAL HIGH (ref 70–99)
Glucose-Capillary: 254 mg/dL — ABNORMAL HIGH (ref 70–99)

## 2021-10-06 LAB — MAGNESIUM: Magnesium: 2.2 mg/dL (ref 1.7–2.4)

## 2021-10-06 MED ORDER — INSULIN ASPART 100 UNIT/ML IJ SOLN
0.0000 [IU] | INTRAMUSCULAR | Status: DC
  Administered 2021-10-06 (×2): 2 [IU] via SUBCUTANEOUS
  Administered 2021-10-06 – 2021-10-07 (×2): 3 [IU] via SUBCUTANEOUS
  Administered 2021-10-07 (×4): 2 [IU] via SUBCUTANEOUS
  Administered 2021-10-08 (×2): 5 [IU] via SUBCUTANEOUS
  Administered 2021-10-08: 3 [IU] via SUBCUTANEOUS
  Administered 2021-10-08: 7 [IU] via SUBCUTANEOUS
  Administered 2021-10-08: 3 [IU] via SUBCUTANEOUS
  Administered 2021-10-09 (×3): 2 [IU] via SUBCUTANEOUS
  Administered 2021-10-09: 3 [IU] via SUBCUTANEOUS
  Filled 2021-10-06 (×17): qty 1

## 2021-10-06 MED ORDER — TRACE MINERALS CU-MN-SE-ZN 300-55-60-3000 MCG/ML IV SOLN
INTRAVENOUS | Status: AC
  Filled 2021-10-06: qty 960

## 2021-10-06 NOTE — Progress Notes (Addendum)
PHARMACY - TOTAL PARENTERAL NUTRITION CONSULT NOTE  ? ?Indication:  Intolerance to enteral feeding ? ?Patient Measurements: ?Height: 6\' 3"  (190.5 cm) ?Weight: 87.6 kg (193 lb 2 oz) ?IBW/kg (Calculated) : 84.5 ?TPN AdjBW (KG): 87.6 ?Body mass index is 24.14 kg/m?. ? ?Assessment: 66 year old male with medical history significant for schizophrenia, hypertension, hyperlipidemia, hypothyroid, medication noncompliance, recurrent seizures, tardive dyskinesia, was brought in from ALF by EMS to ED with altered mental status. Pharmacy has been consulted for TPN.  ? ?Glucose / Insulin: BG 246 will restart SSI. ?Electrolytes: WNL ?Renal: Scr <1, stable ?Hepatic: WNL ?Intake / Output; MIVF: +5.8 L ?GI Imaging: none recently ?GI Surgeries / Procedures: none recently ? ?Central access: PICC line placed 3/26 ?TPN start date: 3/27 ? ?Nutritional Goals: ?Goal TPN rate is 100 mL/hr (provides 144 g of protein and 2803 kcals per day) ? ?RD Assessment: ?Estimated Needs ?Total Energy Estimated Needs: [phone] ?Total Protein Estimated Needs: 130-145 grams ?Total Fluid Estimated Needs: > 2 L ? ?Current Nutrition:  ?NPO ? ?Plan:  ?Continue TPN at 100 mL/hr  (total volume including overfill 2500 mL) ?Nutritional components ?Amino acids (using 15% Clinisol): 144 grams ?Dextrose: 408 grams ?Lipids (using 20% SMOFlipids): 84 grams ?kCal: 2803 ?Electrolytes in TPN: Na 25mEq/L, K 11mEq/L, Ca 42mEq/L, Mg 65mEq/L, and Phos 45mmol/L. Cl:Ac 1:1 ?Add standard MVI and trace elements to TPN ?Completed thiamine course 100 mg x 5 days  ?Monitor TPN labs on Mon/Thurs ? ?Oswald Hillock, PharmD, BCPS ?10/06/2021,9:12 AM ? ?

## 2021-10-06 NOTE — Progress Notes (Signed)
?PROGRESS NOTE ? ? ? ?[name]  IWP:[id number] DOB: 1955-09-07 DOA: 09/17/2021 ?PCP: Koren Bound, NP  ? ? ?Brief Narrative:  ?66 year old male with medical history significant for schizophrenia, hypertension, hyperlipidemia, hypothyroid, medication noncompliance, recurrent seizures, tardive dyskinesia, was brought in from ALF by EMS to ED with altered mental status after he was found to be somnolent, not responding to questions as typical and with right eye drainage.  EMS reported that patient was found on the floor by staff at ALF on 3/13 morning.  Per EDP note, patient was not taking his medications.  In ED, agitated and combative and received IV Haldol 5 mg x 2 and Ativan 2 mg IV x2 on night of arrival.  Admitted for acute encephalopathy likely due to his uncontrolled primary psychiatric disorder i.e. schizophrenia.   ?  ?Psychiatry and neurology consulted.  Patient was given long-acting Haldol.  Depakote level was noted to be elevated and Depakote dose was changed.   ?  ?3/21: Patient's mentation worsened over few days.  Was noted to be febrile with new cough.  Started on antibiotics for possible aspiration pneumonia, completed course of Unasyn.  SLP evaluated swallow and deemed patient severe aspiration risk, patient made NPO.  Dietitian following.   Unable to place NG tube, pt does not swallow on command.  Now on TPN, started 3/27.  ?  ?3/22 >> persistent severe encephalopathy, apneic spells but has been overall stable. ?  ?3/28: stable, slight improvement since starting TPN and IV thiamine. He made eye contact today and attempted to speak to me (mumbled), first interaction I've seen from him, although it was brief ? ?3/29 eyes closed. Snoring loudly. Does not open eyes during exam.  ?3/30 interactive today. Calm.  ?3/31 non interactive with me this am. Plan for ECT today ?4/1 ECT yesterday.  ? ?Consultants:  ?psychiatry ? ?Procedures:  ? ?Antimicrobials:  ?  ? ? ?Subjective: ?Sleeping, not interactive.  Sitter at bedside no issues. In mittens ? ?Objective: ?Vitals:  ? 10/05/21 1534 10/05/21 1936 10/06/21 0308 10/06/21 0322  ?BP: 123/84 128/83 (!) 99/56   ?Pulse: 98 99 95   ?Resp: 19 17 17    ?Temp: 97.6 ?F (36.4 ?C) 97.8 ?F (36.6 ?C) 97.8 ?F (36.6 ?C)   ?TempSrc: Oral Oral    ?SpO2: 100% 100% 100%   ?Weight:    87.6 kg  ?Height:    6\' 3"  (1.905 m)  ? ? ?Intake/Output Summary (Last 24 hours) at 10/06/2021 0800 ?Last data filed at 10/05/2021 2322 ?Gross per 24 hour  ?Intake 2637.46 ml  ?Output 1400 ml  ?Net 1237.46 ml  ? ?Filed Weights  ? 10/04/21 0500 10/05/21 1117 10/06/21 0322  ?Weight: 87.4 kg 87.4 kg 87.6 kg  ? ? ?Examination: ?Calm, NAD, sleeping noninteractive in mittens ?Cta no w/r ?Reg s1/s2 no gallop ?Soft benign +bs ?No edema ?Mood and affect appropriate in current setting  ? ? ? ?Data Reviewed: I have personally reviewed following labs and imaging studies ? ?CBC: ?Recent Labs  ?Lab 09/30/21 ?0414 10/04/21 ?0325  ?WBC 7.2 7.0  ?HGB 13.1 10.8*  ?HCT 40.7 33.5*  ?MCV 83.6 86.6  ?PLT 423* 423*  ? ?Basic Metabolic Panel: ?Recent Labs  ?Lab 09/30/21 ?0414 10/01/21 ?10/02/21 10/02/21 ?0434 10/03/21 ?0300 10/04/21 ?0434 10/06/21 ?10/06/21  ?NA 143 140 141 140 139 144  ?K 5.5* 3.8 4.0 4.1 4.2 4.2  ?CL 104 101 101 104 105 106  ?CO2 32 32 31 29 28 30   ?GLUCOSE 128* 130* 137* 143*  166* 246*  ?BUN 16 15 17 21  24* 26*  ?CREATININE 0.97 0.83 0.74 0.79 0.76 0.75  ?CALCIUM 8.6* 8.3* 8.6* 8.4* 8.4* 8.5*  ?MG 2.0 1.9 2.0 1.9 1.8 2.2  ?PHOS 3.3 3.6 3.6 3.3 3.2  --   ? ?GFR: ?Estimated Creatinine Clearance: 108.6 mL/min (by C-G formula based on SCr of 0.75 mg/dL). ?Liver Function Tests: ?Recent Labs  ?Lab 10/01/21 ?10/03/21 10/02/21 ?10/04/21 10/04/21 ?0434  ?AST 39 37 31  ?ALT 38 42 34  ?ALKPHOS 57 55 46  ?BILITOT 0.9 0.8 0.5  ?PROT 6.8 7.0 6.5  ?ALBUMIN 2.7* 2.7* 2.6*  ? ?No results for input(s): LIPASE, AMYLASE in the last 168 hours. ?No results for input(s): AMMONIA in the last 168 hours. ?Coagulation Profile: ?No results for input(s): INR,  PROTIME in the last 168 hours. ?Cardiac Enzymes: ?Recent Labs  ?Lab 10/02/21 ?0500  ?CKTOTAL 129  ? ?BNP (last 3 results) ?No results for input(s): PROBNP in the last 8760 hours. ?HbA1C: ?No results for input(s): HGBA1C in the last 72 hours. ?CBG: ?Recent Labs  ?Lab 10/03/21 ?1735 10/03/21 ?2336 10/04/21 ?10/06/21 10/04/21 ?10/06/21 10/06/21 ?0400  ?GLUCAP 150* 137* 145* 175* 254*  ? ?Lipid Profile: ?No results for input(s): CHOL, HDL, LDLCALC, TRIG, CHOLHDL, LDLDIRECT in the last 72 hours. ? ?Thyroid Function Tests: ?No results for input(s): TSH, T4TOTAL, FREET4, T3FREE, THYROIDAB in the last 72 hours. ?Anemia Panel: ?No results for input(s): VITAMINB12, FOLATE, FERRITIN, TIBC, IRON, RETICCTPCT in the last 72 hours. ?Sepsis Labs: ?No results for input(s): PROCALCITON, LATICACIDVEN in the last 168 hours. ? ?No results found for this or any previous visit (from the past 240 hour(s)).  ? ? ? ? ? ?Radiology Studies: ?No results found. ? ? ? ? ? ?Scheduled Meds: ? Chlorhexidine Gluconate Cloth  6 each Topical Daily  ? cloNIDine  0.1 mg Transdermal Weekly  ? enoxaparin (LOVENOX) injection  40 mg Subcutaneous Q24H  ? folic acid  1 mg Intravenous Daily  ? haloperidol decanoate  200 mg Intramuscular Q30 days  ? levothyroxine  75 mcg Intravenous Daily  ? mouth rinse  15 mL Mouth Rinse BID  ? OLANZapine  15 mg Intramuscular BID  ? sodium chloride flush  10-40 mL Intracatheter Q12H  ? ?Continuous Infusions: ? sodium chloride 0 mL/hr at 09/27/21 2126  ? TPN ADULT (ION) 100 mL/hr at 10/05/21 1809  ? ? ?Assessment & Plan: ?  ?Principal Problem: ?  Acute encephalopathy ?Active Problems: ?  Aspiration pneumonia (HCC) ?  Schizophrenia (HCC) ?  Hypernatremia ?  Impaired nutrition ?  Protein-calorie malnutrition, severe ?  Seizure (HCC) ?  HTN (hypertension) ?  Acute conjunctivitis, right eye ?  Diabetes (HCC) ?  Hyperlipidemia ?  Hypothyroid ?  Noncompliance ?  Hyperkalemia ? ? ?Acute encephalopathy ?Initial medical work-up largely  unremarkable as below: ?No CO2 retention on VBG ?CMP, CBC, TSH, ammonia level, flu and COVID-19 RT-PCR all unremarkable. ?Chest x-ray and CT head without acute abnormalities. ?Urine studies are pending.   ?Depakote was changed to IV.   ?Patient started on Zyprexa intramuscularly.  Patient also given a dose of Haldol decanoate. ? -Patient became more unresponsive on 3/16.  He was noted to be febrile.  Concern was for aspiration pneumonia since patient developed a cough.  COVID-19 test was previously negative.   ?He was also found to have elevated valproic acid level raising concern for valproic acid toxicity.  Ammonia level was also elevated thought to be from Depakote as well.  Depakote dosage  was adjusted.  He is on Keppra but had very low dose, now d/c'd.   ?Chest x-ray was repeated.  ABG was done which showed elevated PCO2 with normal pH suggesting chronic respiratory failure with compensation.  CT head was also repeated which does not show any new findings.  Depakote level remains normal.  ?MRI brain does not show any acute findings.  ?EEG is showed only diffuse slowing, no seizure activity. ?--Neurology consulted due to lack of improvement - now signed off   ?--Psych resumed olanzapine after no changes with it off ?--Follow up psych and neurology recommendations  ?--Delirium precautions ?--Sitter as needed for safety ?--Neurochecks ?Psychiatry spoke to legal guardian representative at [facility] and discussed ECT treatment.   ?Plan to start electroconvulsive therapy tomorrow  ?4/1 ECT yesterday by psychiatry  ?Psychiatry recommends keeping him off of benzodiazepine and anticonvulsants and will treat again on Monday ? ?  ?Aspiration pneumonia (HCC) ?Acute respiratory failure with hypoxia due to pneumonia- resolved ?Respiratory status overall stable, on room air.   Lungs clear.  Patient does appear to have increased work of breathing which at this point seems related to his  metabolic encephalopathy. ?4/1 completed Unasyn 7-day course  ? ? ? ? ? ? ?  ?  ?  ?Protein-calorie malnutrition, severe ?Due to acute illness as evidenced by moderate fat depletion, moderate muscle depletion, se

## 2021-10-06 NOTE — Progress Notes (Signed)
Noted yellow mews, rechecked vital signs, patient lying on right side when blood pressure was initially taken, heart rate retaken manually. Situation not escalated but relayed to Charge nurse Raynelle Fanning. ? ? ? 10/06/21 1924 10/06/21 2101  ?Assess: MEWS Score  ?BP (!) 96/52 ?(pt lying on sicde)  --   ?Pulse Rate (!) 115  --   ?SpO2 96 %  --   ?Assess: MEWS Score  ?MEWS Temp 0 0  ?MEWS Systolic 1 0  ?MEWS Pulse 2 0  ?MEWS RR 0 0  ?MEWS LOC 0 0  ?MEWS Score 3 0  ?MEWS Score Color Yellow Green  ?Assess: if the MEWS score is Yellow or Red  ?Were vital signs taken at a resting state? Yes  --   ?Focused Assessment No change from prior assessment  --   ?Does the patient meet 2 or more of the SIRS criteria? No  --   ?Does the patient have a confirmed or suspected source of infection? No  --   ?MEWS guidelines implemented *See Row Information* No, vital signs rechecked  --   ?Notify: Charge Nurse/RN  ?Name of Charge Nurse/RN Notified Raynelle Fanning  --   ?Date Charge Nurse/RN Notified 10/06/21  --   ?Time Charge Nurse/RN Notified 1936  --   ?Assess: SIRS CRITERIA  ?SIRS Temperature  0 0  ?SIRS Pulse 1 1  ?SIRS Respirations  0 0  ?SIRS WBC 0 0  ?SIRS Score Sum  1 1  ? ? ?

## 2021-10-06 NOTE — Progress Notes (Signed)
Speech Language Pathology Treatment: Dysphagia  ?Patient Details ?Name: [name] ?MRN: [id number] ?DOB: 1956-04-28 ?Today's Date: 10/06/2021 ?Time: 9735-3299 ?SLP Time Calculation (min) (ACUTE ONLY): 20 min ? ?Assessment / Plan / Recommendation ?Clinical Impression ? Pt seen for ongoing dysphagia management. Pt was awake, alert, talkative with sitter present and bilateral mitts in place. Mitts removed for self-feeding. Skilled observation was provided of pt consuming thin liquids via straw and puree. When consuming puree, pt's oral phase was functional by was lengthy (which could be his baseline). However, given this, advanced solids were not trialed. Pt consumed multiple consecutive sips of thin liquids via straw with the appearance of a swift swallow response and no overt s/s of aspiration.  ? ?Given pt's current mentation and his ECT schedule, recommend initiating conservative diet of dysphagia 1 with thin liquids via straw, medicine crushed in puree. As pt mentation improves, please reconsult ST for further diet advancement.  ? ? ?  ?HPI HPI: Pt is a 66 year old male with history of Schizophrenia, DM, hypertension, hyperlipidemia, hypothyroid, noncompliance with medications, recurrent seizures AND tardive dyskinesia who presents emergency department for chief concerns of altered mental status. EMS reported that patient was found on the floor by staff at ALF on 3/13 morning.  Per EDP note, patient was not taking his medications.  In ED, agitated and combative and received IV Haldol 5 mg x 2 and Ativan 2 mg IV x2 on night of arrival.  Admitted for acute encephalopathy likely due to his uncontrolled primary psychiatric disorder i.e. schizophrenia.  Psychiatry is following per chart.  Pt is unable to provide any related history d/t AMS. Pt has remained NPO since admission and is now without adequate nutrition for >7 days. Per chart, pt is down 11lbs(5%) since admission; this is significant weight loss per  Dietician.   MRI: No acute intracranial  abnormality.  CXRs since admit: No acute airspace disease, effusion, or pneumothorax; min left basilar atelectasis. ?  ?   ?SLP Plan ? All goals met ? ?  ?  ?Recommendations for follow up therapy are one component of a multi-disciplinary discharge planning process, led by the attending physician.  Recommendations may be updated based on patient status, additional functional criteria and insurance authorization. ?  ? ?Recommendations  ?Diet recommendations: Dysphagia 1 (puree);Thin liquid ?Liquids provided via: Straw ?Medication Administration: Crushed with puree ?Compensations: Minimize environmental distractions;Slow rate;Small sips/bites ?Postural Changes and/or Swallow Maneuvers: Seated upright 90 degrees;Upright 30-60 min after meal;Out of bed for meals  ?   ?    ?   ? ? ? ? Oral Care Recommendations: Oral care BID ?Follow Up Recommendations: No SLP follow up ?Assistance recommended at discharge: Frequent or constant Supervision/Assistance ?SLP Visit Diagnosis: Dysphagia, oral phase (R13.11) ?Plan: All goals met ? ? ? ? ?  ?  ? ?[name] B. Rutherford Nail, M.S., CCC-SLP, CBIS ?Speech-Language Pathologist ?Rehabilitation Services ?Office [phone] ? ?[name] ? ?10/06/2021, 3:07 PM ?

## 2021-10-07 DIAGNOSIS — G9340 Encephalopathy, unspecified: Secondary | ICD-10-CM | POA: Diagnosis not present

## 2021-10-07 LAB — GLUCOSE, CAPILLARY
Glucose-Capillary: 178 mg/dL — ABNORMAL HIGH (ref 70–99)
Glucose-Capillary: 102 mg/dL — ABNORMAL HIGH (ref 70–99)
Glucose-Capillary: 164 mg/dL — ABNORMAL HIGH (ref 70–99)
Glucose-Capillary: 183 mg/dL — ABNORMAL HIGH (ref 70–99)
Glucose-Capillary: 180 mg/dL — ABNORMAL HIGH (ref 70–99)
Glucose-Capillary: 177 mg/dL — ABNORMAL HIGH (ref 70–99)
Glucose-Capillary: 219 mg/dL — ABNORMAL HIGH (ref 70–99)

## 2021-10-07 MED ORDER — OLANZAPINE 7.5 MG PO TABS: 15.0000 mg | ORAL_TABLET | Freq: Two times a day (BID) | ORAL | Status: DC

## 2021-10-07 MED ORDER — TRACE MINERALS CU-MN-SE-ZN 300-55-60-3000 MCG/ML IV SOLN
INTRAVENOUS | Status: AC
  Filled 2021-10-07: qty 960

## 2021-10-07 MED ORDER — OLANZAPINE 10 MG PO TABS
20.0000 mg | ORAL_TABLET | Freq: Every day | ORAL | Status: DC
  Administered 2021-10-07 – 2021-10-21 (×10): 20 mg via ORAL
  Filled 2021-10-07 (×16): qty 2

## 2021-10-07 NOTE — Plan of Care (Signed)
?  Problem: Clinical Measurements: ?Goal: Will remain free from infection ?Outcome: Not Progressing ?  ?Problem: Clinical Measurements: ?Goal: Cardiovascular complication will be avoided ?Outcome: Not Progressing ?  ?Problem: Clinical Measurements: ?Goal: Respiratory complications will improve ?Outcome: Not Progressing ?  ?Problem: Nutrition: ?Goal: Adequate nutrition will be maintained ?Outcome: Not Progressing ?  ?Problem: Elimination: ?Goal: Will not experience complications related to bowel motility ?Outcome: Not Progressing ?  ?Problem: Safety: ?Goal: Ability to remain free from injury will improve ?Outcome: Not Progressing ?  ?Problem: Skin Integrity: ?Goal: Risk for impaired skin integrity will decrease ?Outcome: Not Progressing ?  ?

## 2021-10-07 NOTE — Progress Notes (Signed)
PHARMACY - TOTAL PARENTERAL NUTRITION CONSULT NOTE  ? ?Indication:  Intolerance to enteral feeding ? ?Patient Measurements: ?Height: 6\' 3"  (190.5 cm) ?Weight: 91.6 kg (201 lb 15.1 oz) ?IBW/kg (Calculated) : 84.5 ?TPN AdjBW (KG): 87.6 ?Body mass index is 25.24 kg/m?. ? ?Assessment: 66 year old male with medical history significant for schizophrenia, hypertension, hyperlipidemia, hypothyroid, medication noncompliance, recurrent seizures, tardive dyskinesia, was brought in from ALF by EMS to ED with altered mental status. Pharmacy has been consulted for TPN.  ? ?Glucose / Insulin: BG 246 > 164 will restart SSI. ?Electrolytes: WNL ?Renal: Scr <1, stable ?Hepatic: WNL ?Intake / Output; MIVF: +7.1 L ?GI Imaging: none recently ?GI Surgeries / Procedures: none recently ? ?Central access: PICC line placed 3/26 ?TPN start date: 3/27 ? ?Nutritional Goals: ?Goal TPN rate is 100 mL/hr (provides 144 g of protein and 2803 kcals per day) ? ?RD Assessment: ?Estimated Needs ?Total Energy Estimated Needs: [phone] ?Total Protein Estimated Needs: 130-145 grams ?Total Fluid Estimated Needs: > 2 L ? ?Current Nutrition:  ?NPO ? ?Plan:  ?Continue TPN at 100 mL/hr  (total volume including overfill 2500 mL) ?Nutritional components ?Amino acids (using 15% Clinisol): 144 grams ?Dextrose: 408 grams ?Lipids (using 20% SMOFlipids): 84 grams ?kCal: 2803 ?Electrolytes in TPN: Na 3mEq/L, K 41mEq/L, Ca 47mEq/L, Mg 51mEq/L, and Phos 31mmol/L. Cl:Ac 1:1 ?Add standard MVI and trace elements to TPN ?Completed thiamine course 100 mg x 5 days  ?Monitor TPN labs on Mon/Thurs ? ?12m, PharmD, BCPS ?10/07/2021,10:01 AM ? ?

## 2021-10-07 NOTE — Progress Notes (Signed)
?PROGRESS NOTE ? ? ? ?[name]  VQM:[id number] DOB: 1956-06-26 DOA: 09/17/2021 ?PCP: Koren Bound, NP  ? ? ?Brief Narrative:  ?66 year old male with medical history significant for schizophrenia, hypertension, hyperlipidemia, hypothyroid, medication noncompliance, recurrent seizures, tardive dyskinesia, was brought in from ALF by EMS to ED with altered mental status after he was found to be somnolent, not responding to questions as typical and with right eye drainage.  EMS reported that patient was found on the floor by staff at ALF on 3/13 morning.  Per EDP note, patient was not taking his medications.  In ED, agitated and combative and received IV Haldol 5 mg x 2 and Ativan 2 mg IV x2 on night of arrival.  Admitted for acute encephalopathy likely due to his uncontrolled primary psychiatric disorder i.e. schizophrenia.   ?  ?Psychiatry and neurology consulted.  Patient was given long-acting Haldol.  Depakote level was noted to be elevated and Depakote dose was changed.   ?  ?3/21: Patient's mentation worsened over few days.  Was noted to be febrile with new cough.  Started on antibiotics for possible aspiration pneumonia, completed course of Unasyn.  SLP evaluated swallow and deemed patient severe aspiration risk, patient made NPO.  Dietitian following.   Unable to place NG tube, pt does not swallow on command.  Now on TPN, started 3/27.  ?  ?3/22 >> persistent severe encephalopathy, apneic spells but has been overall stable. ?  ?3/28: stable, slight improvement since starting TPN and IV thiamine. He made eye contact today and attempted to speak to me (mumbled), first interaction I've seen from him, although it was brief ? ?3/29 eyes closed. Snoring loudly. Does not open eyes during exam.  ?3/30 interactive today. Calm.  ?3/31 non interactive with me this am. Plan for ECT today ?4/1 ECT yesterday.  ?4/2 speech evaluated patient's swallowing, recommended dysphagia 1 pur?ed diet still on TPN for  now. ? ?Consultants:  ?psychiatry ? ?Procedures:  ? ?Antimicrobials:  ?  ? ? ?Subjective: ?Awake, not interactive, does not respond to my questions.  Has eaten his breakfast ? ?Objective: ?Vitals:  ? 10/06/21 1941 10/06/21 2101 10/07/21 0345 10/07/21 0350  ?BP:  130/68 (!) 107/58   ?Pulse: (!) 102 98 89   ?Resp: 18  18   ?Temp:  97.8 ?F (36.6 ?C) 98.8 ?F (37.1 ?C)   ?TempSrc:      ?SpO2: 100% 97% 99%   ?Weight:    91.6 kg  ?Height:      ? ? ?Intake/Output Summary (Last 24 hours) at 10/07/2021 0838 ?Last data filed at 10/07/2021 0349 ?Gross per 24 hour  ?Intake 3661.38 ml  ?Output 1400 ml  ?Net 2261.38 ml  ? ?Filed Weights  ? 10/05/21 1117 10/06/21 0322 10/07/21 0350  ?Weight: 87.4 kg 87.6 kg 91.6 kg  ? ? ?Examination: ?Calm, NAD ?Cta no w/r ?Reg s1/s2 no gallop ?Soft benign +bs ?No edema ?Awake, noninteractive ?Mood and affect appropriate in current setting  ? ? ? ?Data Reviewed: I have personally reviewed following labs and imaging studies ? ?CBC: ?Recent Labs  ?Lab 10/04/21 ?0325  ?WBC 7.0  ?HGB 10.8*  ?HCT 33.5*  ?MCV 86.6  ?PLT 423*  ? ?Basic Metabolic Panel: ?Recent Labs  ?Lab 10/01/21 ?9326 10/02/21 ?0434 10/03/21 ?0300 10/04/21 ?0434 10/06/21 ?7124  ?NA 140 141 140 139 144  ?K 3.8 4.0 4.1 4.2 4.2  ?CL 101 101 104 105 106  ?CO2 32 31 29 28 30   ?GLUCOSE 130* 137* 143* 166* 246*  ?  BUN 15 17 21  24* 26*  ?CREATININE 0.83 0.74 0.79 0.76 0.75  ?CALCIUM 8.3* 8.6* 8.4* 8.4* 8.5*  ?MG 1.9 2.0 1.9 1.8 2.2  ?PHOS 3.6 3.6 3.3 3.2  --   ? ?GFR: ?Estimated Creatinine Clearance: 108.6 mL/min (by C-G formula based on SCr of 0.75 mg/dL). ?Liver Function Tests: ?Recent Labs  ?Lab 10/01/21 ?10/03/21 10/02/21 ?10/04/21 10/04/21 ?0434  ?AST 39 37 31  ?ALT 38 42 34  ?ALKPHOS 57 55 46  ?BILITOT 0.9 0.8 0.5  ?PROT 6.8 7.0 6.5  ?ALBUMIN 2.7* 2.7* 2.6*  ? ?No results for input(s): LIPASE, AMYLASE in the last 168 hours. ?No results for input(s): AMMONIA in the last 168 hours. ?Coagulation Profile: ?No results for input(s): INR, PROTIME in the last  168 hours. ?Cardiac Enzymes: ?Recent Labs  ?Lab 10/02/21 ?0500  ?CKTOTAL 129  ? ?BNP (last 3 results) ?No results for input(s): PROBNP in the last 8760 hours. ?HbA1C: ?No results for input(s): HGBA1C in the last 72 hours. ?CBG: ?Recent Labs  ?Lab 10/06/21 ?1022 10/06/21 ?1543 10/06/21 ?1947 10/06/21 ?2342 10/07/21 ?0343  ?GLUCAP 192* 170* 178* 240* 102*  ? ?Lipid Profile: ?No results for input(s): CHOL, HDL, LDLCALC, TRIG, CHOLHDL, LDLDIRECT in the last 72 hours. ? ?Thyroid Function Tests: ?No results for input(s): TSH, T4TOTAL, FREET4, T3FREE, THYROIDAB in the last 72 hours. ?Anemia Panel: ?No results for input(s): VITAMINB12, FOLATE, FERRITIN, TIBC, IRON, RETICCTPCT in the last 72 hours. ?Sepsis Labs: ?No results for input(s): PROCALCITON, LATICACIDVEN in the last 168 hours. ? ?No results found for this or any previous visit (from the past 240 hour(s)).  ? ? ? ? ? ?Radiology Studies: ?No results found. ? ? ? ? ? ?Scheduled Meds: ? Chlorhexidine Gluconate Cloth  6 each Topical Daily  ? cloNIDine  0.1 mg Transdermal Weekly  ? enoxaparin (LOVENOX) injection  40 mg Subcutaneous Q24H  ? folic acid  1 mg Intravenous Daily  ? haloperidol decanoate  200 mg Intramuscular Q30 days  ? insulin aspart  0-9 Units Subcutaneous Q4H  ? levothyroxine  75 mcg Intravenous Daily  ? mouth rinse  15 mL Mouth Rinse BID  ? OLANZapine  15 mg Intramuscular BID  ? sodium chloride flush  10-40 mL Intracatheter Q12H  ? ?Continuous Infusions: ? sodium chloride 0 mL/hr at 09/27/21 2126  ? TPN ADULT (ION) 100 mL/hr at 10/07/21 0349  ? ? ?Assessment & Plan: ?  ?Principal Problem: ?  Acute encephalopathy ?Active Problems: ?  Aspiration pneumonia (HCC) ?  Schizophrenia (HCC) ?  Hypernatremia ?  Impaired nutrition ?  Protein-calorie malnutrition, severe ?  Seizure (HCC) ?  HTN (hypertension) ?  Acute conjunctivitis, right eye ?  Diabetes (HCC) ?  Hyperlipidemia ?  Hypothyroid ?  Noncompliance ?  Hyperkalemia ? ? ?Acute encephalopathy ?Initial medical  work-up largely unremarkable as below: ?No CO2 retention on VBG ?CMP, CBC, TSH, ammonia level, flu and COVID-19 RT-PCR all unremarkable. ?Chest x-ray and CT head without acute abnormalities. ?Urine studies are pending.   ?Depakote was changed to IV.   ?Patient started on Zyprexa intramuscularly.  Patient also given a dose of Haldol decanoate. ? -Patient became more unresponsive on 3/16.  He was noted to be febrile.  Concern was for aspiration pneumonia since patient developed a cough.  COVID-19 test was previously negative.   ?He was also found to have elevated valproic acid level raising concern for valproic acid toxicity.  Ammonia level was also elevated thought to be from Depakote as well.  Depakote  dosage was adjusted.  He is on Keppra but had very low dose, now d/c'd.   ?Chest x-ray was repeated.  ABG was done which showed elevated PCO2 with normal pH suggesting chronic respiratory failure with compensation.  CT head was also repeated which does not show any new findings.  Depakote level remains normal.  ?MRI brain does not show any acute findings.  ?EEG is showed only diffuse slowing, no seizure activity. ?--Neurology consulted due to lack of improvement - now signed off   ?--Psych resumed olanzapine after no changes with it off ?--Follow up psych and neurology recommendations  ?--Delirium precautions ?--Sitter as needed for safety ?--Neurochecks ?Psychiatry spoke to legal guardian representative at [facility] and discussed ECT treatment.   ?Plan to start electroconvulsive therapy tomorrow  ?4/1 ECT yesterday by psychiatry  ?4/2 psychiatry recommends keeping him off of benzodiazepine and anticonvulsants and treat again on Monday  ? ? ?  ?Aspiration pneumonia (HCC) ?Acute respiratory failure with hypoxia due to pneumonia- resolved ?Respiratory status overall stable, on room air.   Lungs clear.  Patient does appear to have increased work of breathing which at this point seems  related to his metabolic encephalopathy. ?4/2 completed Unasyn 7-day course  ? ? ? ? ? ? ? ?  ?  ?  ?Protein-calorie malnutrition, severe ?Due to acute illness as evidenced by moderate fat depletion, moderate muscle

## 2021-10-08 ENCOUNTER — Inpatient Hospital Stay (HOSPITAL_COMMUNITY)
Admit: 2021-10-08 | Discharge: 2021-10-08 | Disposition: A | Discharge status: Home / Self Care | Payer: Medicare Other | Attending: Psychiatry | Admitting: Psychiatry

## 2021-10-08 ENCOUNTER — Inpatient Hospital Stay: Payer: Medicare Other | Admitting: Anesthesiology

## 2021-10-08 ENCOUNTER — Other Ambulatory Visit: Payer: Self-pay | Admitting: Psychiatry

## 2021-10-08 DIAGNOSIS — G9340 Encephalopathy, unspecified: Secondary | ICD-10-CM | POA: Diagnosis not present

## 2021-10-08 DIAGNOSIS — F203 Undifferentiated schizophrenia: Secondary | ICD-10-CM

## 2021-10-08 LAB — GLUCOSE, CAPILLARY
Glucose-Capillary: 148 mg/dL — ABNORMAL HIGH (ref 70–99)
Glucose-Capillary: 176 mg/dL — ABNORMAL HIGH (ref 70–99)
Glucose-Capillary: 208 mg/dL — ABNORMAL HIGH (ref 70–99)
Glucose-Capillary: 216 mg/dL — ABNORMAL HIGH (ref 70–99)
Glucose-Capillary: 273 mg/dL — ABNORMAL HIGH (ref 70–99)
Glucose-Capillary: 292 mg/dL — ABNORMAL HIGH (ref 70–99)
Glucose-Capillary: 324 mg/dL — ABNORMAL HIGH (ref 70–99)

## 2021-10-08 LAB — COMPREHENSIVE METABOLIC PANEL
ALT: 36 U/L (ref 0–44)
AST: 26 U/L (ref 15–41)
Albumin: 2.5 g/dL — ABNORMAL LOW (ref 3.5–5.0)
Alkaline Phosphatase: 45 U/L (ref 38–126)
Anion gap: 5 (ref 5–15)
BUN: 26 mg/dL — ABNORMAL HIGH (ref 8–23)
CO2: 29 mmol/L (ref 22–32)
Calcium: 8 mg/dL — ABNORMAL LOW (ref 8.9–10.3)
Chloride: 106 mmol/L (ref 98–111)
Creatinine, Ser: 0.71 mg/dL (ref 0.61–1.24)
GFR, Estimated: >60 mL/min (ref >60)
Glucose, Bld: 209 mg/dL — ABNORMAL HIGH (ref 70–99)
Potassium: 4.4 mmol/L (ref 3.5–5.1)
Sodium: 140 mmol/L (ref 135–145)
Total Bilirubin: 0.4 mg/dL (ref 0.3–1.2)
Total Protein: 6 g/dL — ABNORMAL LOW (ref 6.5–8.1)

## 2021-10-08 LAB — MAGNESIUM: Magnesium: 1.9 mg/dL (ref 1.7–2.4)

## 2021-10-08 LAB — TRIGLYCERIDES: Triglycerides: 53 mg/dL (ref <150)

## 2021-10-08 LAB — PHOSPHORUS: Phosphorus: 3.1 mg/dL (ref 2.5–4.6)

## 2021-10-08 MED ORDER — METHOHEXITAL SODIUM 100 MG/10ML IV SOSY
PREFILLED_SYRINGE | INTRAVENOUS | Status: DC | PRN
  Administered 2021-10-08: 90 mg via INTRAVENOUS

## 2021-10-08 MED ORDER — ADULT MULTIVITAMIN W/MINERALS CH
1.0000 | ORAL_TABLET | Freq: Every day | ORAL | Status: DC
  Administered 2021-10-09 – 2021-10-22 (×4): 1 via ORAL
  Filled 2021-10-08 (×11): qty 1

## 2021-10-08 MED ORDER — SODIUM CHLORIDE 0.9 % IV SOLN
500.0000 mL | Freq: Once | INTRAVENOUS | Status: DC
Start: 2021-10-08 — End: 2021-10-09

## 2021-10-08 MED ORDER — ENSURE ENLIVE PO LIQD
237.0000 mL | Freq: Three times a day (TID) | ORAL | Status: DC
  Administered 2021-10-08 – 2021-10-22 (×17): 237 mL via ORAL

## 2021-10-08 MED ORDER — LABETALOL HCL 5 MG/ML IV SOLN
INTRAVENOUS | Status: DC | PRN
  Administered 2021-10-08: 10 mg via INTRAVENOUS

## 2021-10-08 MED ORDER — METHOHEXITAL SODIUM 0.5 G IJ SOLR
INTRAMUSCULAR | Status: AC
  Filled 2021-10-08: qty 500

## 2021-10-08 MED ORDER — SUCCINYLCHOLINE CHLORIDE 200 MG/10ML IV SOSY
PREFILLED_SYRINGE | INTRAVENOUS | Status: DC | PRN
  Administered 2021-10-08: 120 mg via INTRAVENOUS

## 2021-10-08 MED ORDER — TRACE MINERALS CU-MN-SE-ZN 300-55-60-3000 MCG/ML IV SOLN
INTRAVENOUS | Status: AC
  Filled 2021-10-08: qty 960

## 2021-10-08 NOTE — Anesthesia Postprocedure Evaluation (Signed)
Anesthesia Post Note ? ?Patient: Kanai Berrios ? ?Procedure(s) Performed: ECT TX ? ?Patient location during evaluation: PACU ?Anesthesia Type: General ?Level of consciousness: awake and alert ?Pain management: pain level controlled ?Vital Signs Assessment: post-procedure vital signs reviewed and stable ?Respiratory status: spontaneous breathing, nonlabored ventilation and respiratory function stable ?Cardiovascular status: blood pressure returned to baseline and stable ?Postop Assessment: no apparent nausea or vomiting ?Anesthetic complications: no ? ? ?No notable events documented. ? ? ?Last Vitals:  ?Vitals:  ? 10/08/21 1325 10/08/21 1330  ?BP:  (!) 152/99  ?Pulse: (!) 105 (!) 110  ?Resp: (!) 22 (!) 23  ?Temp:  (!) 36.4 ?C  ?SpO2: 98% 97%  ?  ?Last Pain:  ?Vitals:  ? 10/08/21 1330  ?TempSrc:   ?PainSc: 0-No pain  ? ? ?  ?  ?  ?  ?  ?  ? ?Foye Deer ? ? ? ? ?

## 2021-10-08 NOTE — Progress Notes (Signed)
?PROGRESS NOTE ? ? ? ?[name]  OVA:[id number] DOB: [birth year] DOA: 09/17/2021 ?PCP: Koren Bound, NP  ? ? ?Brief Narrative:  ?66 year old male with medical history significant for schizophrenia, hypertension, hyperlipidemia, hypothyroid, medication noncompliance, recurrent seizures, tardive dyskinesia, was brought in from ALF by EMS to ED with altered mental status after he was found to be somnolent, not responding to questions as typical and with right eye drainage.  EMS reported that patient was found on the floor by staff at ALF on 3/13 morning.  Per EDP note, patient was not taking his medications.  In ED, agitated and combative and received IV Haldol 5 mg x 2 and Ativan 2 mg IV x2 on night of arrival.  Admitted for acute encephalopathy likely due to his uncontrolled primary psychiatric disorder i.e. schizophrenia.   ?  ?Psychiatry and neurology consulted.  Patient was given long-acting Haldol.  Depakote level was noted to be elevated and Depakote dose was changed.   ?  ?3/21: Patient's mentation worsened over few days.  Was noted to be febrile with new cough.  Started on antibiotics for possible aspiration pneumonia, completed course of Unasyn.  SLP evaluated swallow and deemed patient severe aspiration risk, patient made NPO.  Dietitian following.   Unable to place NG tube, pt does not swallow on command.  Now on TPN, started 3/27.  ?  ?3/22 >> persistent severe encephalopathy, apneic spells but has been overall stable. ?  ?3/28: stable, slight improvement since starting TPN and IV thiamine. He made eye contact today and attempted to speak to me (mumbled), first interaction I've seen from him, although it was brief ? ?3/29 eyes closed. Snoring loudly. Does not open eyes during exam.  ?3/30 interactive today. Calm.  ?3/31 non interactive with me this am. Plan for ECT today ?4/1 ECT yesterday.  ?4/2 speech evaluated patient's swallowing, recommended dysphagia 1 pur?ed diet still on TPN for now. ?4/3 ECT  today. Currently npo ? ? ?Consultants:  ?psychiatry ? ?Procedures:  ? ?Antimicrobials:  ?  ? ? ?Subjective: ?Denies sob, cp. Reports "doing fine" .  ? ?Objective: ?Vitals:  ? 10/07/21 1941 10/08/21 0340 10/08/21 0400 10/08/21 0806  ?BP: 111/83 124/74  106/60  ?Pulse: (!) 103 95  100  ?Resp: 17 16  16   ?Temp: 97.9 ?F (36.6 ?C) 98.8 ?F (37.1 ?C)  98.7 ?F (37.1 ?C)  ?TempSrc: Axillary   Axillary  ?SpO2: 100% 100%  98%  ?Weight:   90.1 kg   ?Height:      ? ? ?Intake/Output Summary (Last 24 hours) at 10/08/2021 0819 ?Last data filed at 10/08/2021 0300 ?Gross per 24 hour  ?Intake 2306.75 ml  ?Output 1730 ml  ?Net 576.75 ml  ? ?Filed Weights  ? 10/06/21 0322 10/07/21 0350 10/08/21 0400  ?Weight: 87.6 kg 91.6 kg 90.1 kg  ? ? ?Examination: ?Calm, NAD ?Cta no w/r ?Reg s1/s2 no gallop ?Soft benign +bs ?No edema ?Awake and alert. Grossly intact ?Mood and affect appropriate in current setting  ? ? ? ?Data Reviewed: I have personally reviewed following labs and imaging studies ? ?CBC: ?Recent Labs  ?Lab 10/04/21 ?0325  ?WBC 7.0  ?HGB 10.8*  ?HCT 33.5*  ?MCV 86.6  ?PLT 423*  ? ?Basic Metabolic Panel: ?Recent Labs  ?Lab 10/02/21 ?0434 10/03/21 ?0300 10/04/21 ?0434 10/06/21 ?12/06/21 10/08/21 ?12/08/21  ?NA 141 140 139 144 140  ?K 4.0 4.1 4.2 4.2 4.4  ?CL 101 104 105 106 106  ?CO2 31 29 28 30 29   ?GLUCOSE  137* 143* 166* 246* 209*  ?BUN 17 21 24* 26* 26*  ?CREATININE 0.74 0.79 0.76 0.75 0.71  ?CALCIUM 8.6* 8.4* 8.4* 8.5* 8.0*  ?MG 2.0 1.9 1.8 2.2 1.9  ?PHOS 3.6 3.3 3.2  --  3.1  ? ?GFR: ?Estimated Creatinine Clearance: 108.6 mL/min (by C-G formula based on SCr of 0.71 mg/dL). ?Liver Function Tests: ?Recent Labs  ?Lab 10/02/21 ?0434 10/04/21 ?0434 10/08/21 ?8676  ?AST 37 31 26  ?ALT 42 34 36  ?ALKPHOS 55 46 45  ?BILITOT 0.8 0.5 0.4  ?PROT 7.0 6.5 6.0*  ?ALBUMIN 2.7* 2.6* 2.5*  ? ?No results for input(s): LIPASE, AMYLASE in the last 168 hours. ?No results for input(s): AMMONIA in the last 168 hours. ?Coagulation Profile: ?No results for input(s):  INR, PROTIME in the last 168 hours. ?Cardiac Enzymes: ?Recent Labs  ?Lab 10/02/21 ?0500  ?CKTOTAL 129  ? ?BNP (last 3 results) ?No results for input(s): PROBNP in the last 8760 hours. ?HbA1C: ?No results for input(s): HGBA1C in the last 72 hours. ?CBG: ?Recent Labs  ?Lab 10/07/21 ?1708 10/07/21 ?1919 10/07/21 ?2305 10/08/21 ?1950 10/08/21 ?9326  ?GLUCAP 180* 177* 219* 216* 208*  ? ?Lipid Profile: ?Recent Labs  ?  10/08/21 ?7124  ?TRIG 53  ? ? ?Thyroid Function Tests: ?No results for input(s): TSH, T4TOTAL, FREET4, T3FREE, THYROIDAB in the last 72 hours. ?Anemia Panel: ?No results for input(s): VITAMINB12, FOLATE, FERRITIN, TIBC, IRON, RETICCTPCT in the last 72 hours. ?Sepsis Labs: ?No results for input(s): PROCALCITON, LATICACIDVEN in the last 168 hours. ? ?No results found for this or any previous visit (from the past 240 hour(s)).  ? ? ? ? ? ?Radiology Studies: ?No results found. ? ? ? ? ? ?Scheduled Meds: ? Chlorhexidine Gluconate Cloth  6 each Topical Daily  ? cloNIDine  0.1 mg Transdermal Weekly  ? enoxaparin (LOVENOX) injection  40 mg Subcutaneous Q24H  ? folic acid  1 mg Intravenous Daily  ? haloperidol decanoate  200 mg Intramuscular Q30 days  ? insulin aspart  0-9 Units Subcutaneous Q4H  ? levothyroxine  75 mcg Intravenous Daily  ? mouth rinse  15 mL Mouth Rinse BID  ? OLANZapine  20 mg Oral QHS  ? sodium chloride flush  10-40 mL Intracatheter Q12H  ? ?Continuous Infusions: ? sodium chloride 0 mL/hr at 09/27/21 2126  ? TPN ADULT (ION) 100 mL/hr at 10/07/21 2321  ? ? ?Assessment & Plan: ?  ?Principal Problem: ?  Acute encephalopathy ?Active Problems: ?  Aspiration pneumonia (HCC) ?  Schizophrenia (HCC) ?  Hypernatremia ?  Impaired nutrition ?  Protein-calorie malnutrition, severe ?  Seizure (HCC) ?  HTN (hypertension) ?  Acute conjunctivitis, right eye ?  Diabetes (HCC) ?  Hyperlipidemia ?  Hypothyroid ?  Noncompliance ?  Hyperkalemia ? ? ?Acute encephalopathy ?Initial medical work-up largely unremarkable as  below: ?No CO2 retention on VBG ?CMP, CBC, TSH, ammonia level, flu and COVID-19 RT-PCR all unremarkable. ?Chest x-ray and CT head without acute abnormalities. ?Urine studies are pending.   ?Depakote was changed to IV.   ?Patient started on Zyprexa intramuscularly.  Patient also given a dose of Haldol decanoate. ? -Patient became more unresponsive on 3/16.  He was noted to be febrile.  Concern was for aspiration pneumonia since patient developed a cough.  COVID-19 test was previously negative.   ?He was also found to have elevated valproic acid level raising concern for valproic acid toxicity.  Ammonia level was also elevated thought to be from Depakote as  well.  Depakote dosage was adjusted.  He is on Keppra but had very low dose, now d/c'd.   ?Chest x-ray was repeated.  ABG was done which showed elevated PCO2 with normal pH suggesting chronic respiratory failure with compensation.  CT head was also repeated which does not show any new findings.  Depakote level remains normal.  ?MRI brain does not show any acute findings.  ?EEG is showed only diffuse slowing, no seizure activity. ?--Neurology consulted due to lack of improvement - now signed off   ?--Psych resumed olanzapine after no changes with it off ?--Follow up psych and neurology recommendations  ?--Delirium precautions ?--Sitter as needed for safety ?--Neurochecks ?Psychiatry spoke to legal guardian representative at [facility] and discussed ECT treatment.   ?Plan to start electroconvulsive therapy tomorrow  ?4/1 ECT yesterday by psychiatry  ?psychiatry recommends keeping him off of benzodiazepine and anticonvulsants while having ECT  ?4/3 ECT today  ? ? ? ?  ?Aspiration pneumonia (HCC) ?Acute respiratory failure with hypoxia due to pneumonia- resolved ?Respiratory status overall stable, on room air.   Lungs clear.  Patient does appear to have increased work of breathing which at this point seems related to his metabolic  encephalopathy. ?4/3 completed Unasyn 7-day course  ? ? ?  ?  ?Protein-calorie malnutrition, severe ?Due to acute illness as evidenced by moderate fat depletion, moderate muscle depletion, severe muscle

## 2021-10-08 NOTE — Progress Notes (Signed)
Inpatient Diabetes Program Recommendations ? ?AACE/ADA: New Consensus Statement on Inpatient Glycemic Control  ? ?Target Ranges:  Prepandial:   less than 140 mg/dL ?     Peak postprandial:   less than 180 mg/dL (1-2 hours) ?     Critically ill patients:  140 - 180 mg/dL  ? ? Latest Reference Range & Units 10/08/21 03:40 10/08/21 08:10  ?Glucose-Capillary 70 - 99 mg/dL 216 (H) 208 (H)  ? ? Latest Reference Range & Units 10/07/21 03:43 10/07/21 09:30 10/07/21 11:48 10/07/21 17:08 10/07/21 19:19 10/07/21 23:05  ?Glucose-Capillary 70 - 99 mg/dL 102 (H) 164 (H) 183 (H) 180 (H) 177 (H) 219 (H)  ? ?Review of Glycemic Control ? ?Diabetes history: DM2 ?Outpatient Diabetes medications: Metformin 1000 mg BID ?Current orders for Inpatient glycemic control: Novolog 0-9 units Q4H; TPN @ 100 ml/hr ? ?Inpatient Diabetes Program Recommendations:   ? ?Insulin: Please consider adding Regular 15 units of insulin to TPN. ? ?Thanks, ?Barnie Alderman, RN, MSN, CDE ?Diabetes Coordinator ?Inpatient Diabetes Program ?[phone] (Team Pager from 8am to 5pm) ? ? ? ?

## 2021-10-08 NOTE — Progress Notes (Signed)
PHARMACY - TOTAL PARENTERAL NUTRITION CONSULT NOTE  ? ?Indication:  Intolerance to enteral feeding ? ?Patient Measurements: ?Height: 6\' 3"  (190.5 cm) ?Weight: 90.1 kg (198 lb 10.2 oz) ?IBW/kg (Calculated) : 84.5 ?TPN AdjBW (KG): 87.6 ?Body mass index is 24.83 kg/m?. ? ?Assessment: 66 year old male with medical history significant for schizophrenia, hypertension, hyperlipidemia, hypothyroid, medication noncompliance, recurrent seizures, tardive dyskinesia, was brought in from ALF by EMS to ED with altered mental status. Pharmacy has been consulted for TPN.  ? ?Glucose / Insulin: BG 209 > 177 on SSI(14u in last 24hr) ?Electrolytes: WNL ?Renal: Scr <1, stable ?Hepatic: WNL ?Intake / Output; MIVF: +5.18 L ?GI Imaging: none recently ?GI Surgeries / Procedures: none recently ? ?Central access: PICC line placed 3/26 ?TPN start date: 3/27 ? ?Nutritional Goals: ?Goal TPN rate is 100 mL/hr (provides 144 g of protein and 2803 kcals per day) ? ?RD Assessment: ?Estimated Needs ?Total Energy Estimated Needs: [phone] ?Total Protein Estimated Needs: 130-145 grams ?Total Fluid Estimated Needs: > 2 L ? ?Current Nutrition:  ?NPO ? ?Plan:  ?Continue TPN at 100 mL/hr  (total volume including overfill 2500 mL) ?Nutritional components ?Amino acids (using 15% Clinisol): 144 grams ?Dextrose: 408 grams ?Lipids (using 20% SMOFlipids): 84 grams ?kCal: 2803 ?Electrolytes in TPN: Na 30mEq/L, K 67mEq/L, Ca 29mEq/L, Mg 81mEq/L, and Phos 52mmol/L. Cl:Ac 1:1 ?Will add 10 units of insulin to TPN along with SSI ?Add standard MVI and trace elements to TPN ?Completed thiamine course 100 mg x 5 days  ?Monitor TPN labs on Mon/Thurs ? ?12m, PharmD ?Clinical Pharmacist ?10/08/2021 ?10:19 AM ? ? ?

## 2021-10-08 NOTE — H&P (Signed)
[name] is an 66 y.o. male.   ?Chief Complaint: Patient seen for second ECT treatment to treat a catatonic-like episode of his schizophrenia.  Patient was disorganized in his thinking and preoccupied with religion but at least was able to cooperate with basic requests and was able to carry on a conversation on his own terms.  Not hostile or aggressive at all.  Not reporting suicidal ideation.  Eating better. ?HPI: Longstanding history of schizophrenia which tends to be brittle in that he can decompensate quickly off his medicine.  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 ? ?Past Medical History:  ?Diagnosis Date  ? Diabetes mellitus without complication (HCC)   ? HTN (hypertension)   ? Hyperammonemia (HCC) While on Depakote  ? Hyperlipemia   ? Lithium toxicity   ? Neutropenia (HCC)   ? Schizophrenia (HCC)   ? Seizure (HCC) While toxic on Depakote  ? Sickle cell trait (HCC)   ? Thyroid disease   ? TIA (transient ischemic attack) in 2009  ? ? ?History reviewed. No pertinent surgical history. ? ?History reviewed. No pertinent family history. ?Social History:  reports that he has never smoked. He has never used smokeless tobacco. He reports that he does not currently use drugs. He reports that he does not drink alcohol. ? ?Allergies:  ?Allergies  ?Allergen Reactions  ? Depakote [Valproic Acid] Other (See Comments)  ?  Reaction:  Hyperammonemia   ? Lithium Other (See Comments)  ?  Pt has a prior history of Lithium toxicity.     ? ? ?(Not in a hospital admission) ? ? ?Results for orders placed or performed during the hospital encounter of 09/17/21 (from the past 48 hour(s))  ?Glucose, capillary     Status: Abnormal  ? Collection Time: 10/06/21  7:47 PM  ?Result Value Ref Range  ? Glucose-Capillary 178 (H) 70 - 99 mg/dL  ?  Comment: Glucose reference range applies only to samples taken after fasting for at least 8 hours.  ? Comment 1 Notify RN   ?Glucose, capillary     Status: Abnormal  ? Collection  Time: 10/06/21 11:42 PM  ?Result Value Ref Range  ? Glucose-Capillary 240 (H) 70 - 99 mg/dL  ?  Comment: Glucose reference range applies only to samples taken after fasting for at least 8 hours.  ?Glucose, capillary     Status: Abnormal  ? Collection Time: 10/07/21  3:43 AM  ?Result Value Ref Range  ? Glucose-Capillary 102 (H) 70 - 99 mg/dL  ?  Comment: Glucose reference range applies only to samples taken after fasting for at least 8 hours.  ? Comment 1 Notify RN   ?Glucose, capillary     Status: Abnormal  ? Collection Time: 10/07/21  9:30 AM  ?Result Value Ref Range  ? Glucose-Capillary 164 (H) 70 - 99 mg/dL  ?  Comment: Glucose reference range applies only to samples taken after fasting for at least 8 hours.  ?Glucose, capillary     Status: Abnormal  ? Collection Time: 10/07/21 11:48 AM  ?Result Value Ref Range  ? Glucose-Capillary 183 (H) 70 - 99 mg/dL  ?  Comment: Glucose reference range applies only to samples taken after fasting for at least 8 hours.  ?Glucose, capillary     Status: Abnormal  ? Collection Time: 10/07/21  5:08 PM  ?Result Value Ref Range  ? Glucose-Capillary 180 (H) 70 - 99 mg/dL  ?  Comment: Glucose reference range applies only to  samples taken after fasting for at least 8 hours.  ?Glucose, capillary     Status: Abnormal  ? Collection Time: 10/07/21  7:19 PM  ?Result Value Ref Range  ? Glucose-Capillary 177 (H) 70 - 99 mg/dL  ?  Comment: Glucose reference range applies only to samples taken after fasting for at least 8 hours.  ?Glucose, capillary     Status: Abnormal  ? Collection Time: 10/07/21 11:05 PM  ?Result Value Ref Range  ? Glucose-Capillary 219 (H) 70 - 99 mg/dL  ?  Comment: Glucose reference range applies only to samples taken after fasting for at least 8 hours.  ?Glucose, capillary     Status: Abnormal  ? Collection Time: 10/08/21  3:40 AM  ?Result Value Ref Range  ? Glucose-Capillary 216 (H) 70 - 99 mg/dL  ?  Comment: Glucose reference range applies only to samples taken after  fasting for at least 8 hours.  ?Comprehensive metabolic panel     Status: Abnormal  ? Collection Time: 10/08/21  5:34 AM  ?Result Value Ref Range  ? Sodium 140 135 - 145 mmol/L  ? Potassium 4.4 3.5 - 5.1 mmol/L  ? Chloride 106 98 - 111 mmol/L  ? CO2 29 22 - 32 mmol/L  ? Glucose, Bld 209 (H) 70 - 99 mg/dL  ?  Comment: Glucose reference range applies only to samples taken after fasting for at least 8 hours.  ? BUN 26 (H) 8 - 23 mg/dL  ? Creatinine, Ser 0.71 0.61 - 1.24 mg/dL  ? Calcium 8.0 (L) 8.9 - 10.3 mg/dL  ? Total Protein 6.0 (L) 6.5 - 8.1 g/dL  ? Albumin 2.5 (L) 3.5 - 5.0 g/dL  ? AST 26 15 - 41 U/L  ? ALT 36 0 - 44 U/L  ? Alkaline Phosphatase 45 38 - 126 U/L  ? Total Bilirubin 0.4 0.3 - 1.2 mg/dL  ? GFR, Estimated >60 >60 mL/min  ?  Comment: (NOTE) ?Calculated using the CKD-EPI Creatinine Equation (2021) ?  ? Anion gap 5 5 - 15  ?  Comment: Performed at [facility], [address], [place], [place] [address]  ?Magnesium     Status: None  ? Collection Time: 10/08/21  5:34 AM  ?Result Value Ref Range  ? Magnesium 1.9 1.7 - 2.4 mg/dL  ?  Comment: Performed at [facility], [address], [place], [place] [address]  ?Phosphorus     Status: None  ? Collection Time: 10/08/21  5:34 AM  ?Result Value Ref Range  ? Phosphorus 3.1 2.5 - 4.6 mg/dL  ?  Comment: Performed at [facility], [address], [place], [place] [address]  ?Triglycerides     Status: None  ? Collection Time: 10/08/21  5:34 AM  ?Result Value Ref Range  ? Triglycerides 53 <150 mg/dL  ?  Comment: Performed at [facility], [address], [place], [place] [address]  ?Glucose, capillary     Status: Abnormal  ? Collection Time: 10/08/21  8:10 AM  ?Result Value Ref Range  ? Glucose-Capillary 208 (H) 70 - 99 mg/dL  ?  Comment: Glucose reference range applies only to samples taken after fasting for at least 8 hours.  ?Glucose, capillary     Status: Abnormal  ? Collection Time: 10/08/21  3:39 PM  ?Result Value Ref  Range  ? Glucose-Capillary 273 (H) 70 - 99 mg/dL  ?  Comment: Glucose reference range applies only to samples taken after fasting for at least 8  hours.  ? ?No results found. ? ?Review of Systems  ?Unable to perform ROS: Psychiatric disorder  ? ?Blood pressure (!) 152/99, pulse (!) 110, temperature (!) 97.5 ?F (36.4 ?C), resp. rate (!) 23, height 6\' 3"  (1.905 m), weight 90.1 kg, SpO2 97 %. ?Physical Exam ?Vitals and nursing note reviewed.  ?Constitutional:   ?   Appearance: He is well-developed.  ?HENT:  ?   Head: Normocephalic and atraumatic.  ?Eyes:  ?   Conjunctiva/sclera: Conjunctivae normal.  ?   Pupils: Pupils are equal, round, and reactive to light.  ?Cardiovascular:  ?   Heart sounds: Normal heart sounds.  ?Pulmonary:  ?   Effort: Pulmonary effort is normal.  ?Abdominal:  ?   Palpations: Abdomen is soft.  ?Musculoskeletal:     ?   General: Normal range of motion.  ?   Cervical back: Normal range of motion.  ?Skin: ?   General: Skin is warm and dry.  ?   Comments: Diffuse extremely dry crusty skin with the worst of it on his lower legs.  ?Neurological:  ?   Mental Status: He is alert.  ?Psychiatric:     ?   Attention and Perception: Attention normal.     ?   Mood and Affect: Affect is blunt.     ?   Speech: Speech is delayed and tangential.     ?   Behavior: Behavior is cooperative.     ?   Thought Content: Thought content is delusional. Thought content does not include homicidal or suicidal ideation.     ?   Cognition and Memory: Cognition is impaired. Memory is impaired.  ?  ? ?Assessment/Plan ?Doing well and tolerating ECT after first treatment we will continue the treatment plan today and he is on the schedule for Wednesday as well. ? ?Mordecai RasmussenJohn Cairo Lingenfelter, MD ?10/08/2021, 5:05 PM ? ? ? ?

## 2021-10-08 NOTE — Anesthesia Preprocedure Evaluation (Signed)
Anesthesia Evaluation  ?Patient identified by MRN, date of birth, ID band ?Patient awake ? ? ? ?Reviewed: ?Allergy & Precautions, H&P , NPO status , Patient's Chart, lab work & pertinent test results, reviewed documented beta blocker date and time  ? ?Airway ?Mallampati: II ? ? ?Neck ROM: full ? ? ? Dental ? ?(+) Poor Dentition ?  ?Pulmonary ?pneumonia, resolved,  ?  ?Pulmonary exam normal ? ? ? ? ? ? ? Cardiovascular ?Exercise Tolerance: Good ?hypertension, On Medications ?negative cardio ROS ?Normal cardiovascular exam ?Rhythm:regular Rate:Normal ? ? ?  ?Neuro/Psych ?Seizures -,  PSYCHIATRIC DISORDERS Schizophrenia TIA  ? GI/Hepatic ?negative GI ROS, Neg liver ROS,   ?Endo/Other  ?diabetesHypothyroidism  ? Renal/GU ?Renal disease  ?negative genitourinary ?  ?Musculoskeletal ? ? Abdominal ?  ?Peds ? Hematology ?negative hematology ROS ?(+)   ?Anesthesia Other Findings ?Past Medical History: ?No date: Diabetes mellitus without complication (HCC) ?No date: HTN (hypertension) ?While on Depakote: Hyperammonemia (HCC) ?No date: Hyperlipemia ?No date: Lithium toxicity ?No date: Neutropenia (HCC) ?No date: Schizophrenia [facility]) ?While toxic on Depakote: Seizure (HCC) ?No date: Sickle cell trait (HCC) ?No date: Thyroid disease ?in 2009: TIA (transient ischemic attack) ?No past surgical history on file. ? ? Reproductive/Obstetrics ?negative OB ROS ? ?  ? ? ? ? ? ? ? ? ? ? ? ? ? ?  ?  ? ? ? ? ? ? ? ? ?Anesthesia Physical ?Anesthesia Plan ? ?ASA: 3 ? ?Anesthesia Plan: General  ? ?Post-op Pain Management:   ? ?Induction:  ? ?PONV Risk Score and Plan:  ? ?Airway Management Planned:  ? ?Additional Equipment:  ? ?Intra-op Plan:  ? ?Post-operative Plan:  ? ?Informed Consent: I have reviewed the patients History and Physical, chart, labs and discussed the procedure including the risks, benefits and alternatives for the proposed anesthesia with the patient or authorized representative who has  indicated his/her understanding and acceptance.  ? ? ? ?Dental Advisory Given ? ?Plan Discussed with: CRNA ? ?Anesthesia Plan Comments:   ? ? ? ? ? ? ?Anesthesia Quick Evaluation ? ?

## 2021-10-08 NOTE — Progress Notes (Signed)
Attempted to do picc dressing change.pt refused by pulling covers up on himself and turning around away from me and not responding to verbal request ?

## 2021-10-08 NOTE — Progress Notes (Signed)
Pt left at 0945 for ECT and just came back to his room.  ?

## 2021-10-08 NOTE — Transfer of Care (Signed)
Immediate Anesthesia Transfer of Care Note ? ?Patient: [name] ? ?Procedure(s) Performed: ECT TX ? ?Patient Location: PACU ? ?Anesthesia Type:General ? ?Level of Consciousness: drowsy ? ?Airway & Oxygen Therapy: Patient Spontanous Breathing ? ?Post-op Assessment: Report given to RN and Post -op Vital signs reviewed and stable ? ?Post vital signs: Reviewed and stable ? ?Last Vitals:  ?Vitals Value Taken Time  ?BP 96/63 10/08/21 1318  ?Temp    ?Pulse 101 10/08/21 1321  ?Resp 16 10/08/21 1321  ?SpO2 100 % 10/08/21 1321  ?Vitals shown include unvalidated device data. ? ?Last Pain:  ?Vitals:  ? 10/08/21 1043  ?TempSrc: Tympanic  ?   ? ?  ? ?Complications: No notable events documented. ?

## 2021-10-08 NOTE — Progress Notes (Signed)
Safety sitter by the bedside. Night RN stated patient was able to walk to the bathroom, with assist and supervision. ?

## 2021-10-08 NOTE — Procedures (Signed)
ECT SERVICES ?Physician?s Interval Evaluation & Treatment Note ? ?Patient Identification: [name] ?MRN:  [id number] ?Date of Evaluation:  10/08/2021 ?Bruin #: 2 ? ?MADRS:  ? ?MMSE:  ? ?P.E. Findings: ? ?Probably overall underweight although it is hard to tell with his body habitus.  Extremely crusty dry skin over much of his body worst of it on his lower legs. ? ?Psychiatric Interval Note: ? ?At least he is conversant and more cooperative even though he is still preoccupied with religious topics and a little disorganized ? ?Subjective: ? ?Patient is a 66 y.o. male seen for evaluation for Electroconvulsive Therapy. ?No complaints ? ?Treatment Summary: ? ? []   Right Unilateral             [x]  Bilateral ?  ?% Energy : 1.0 ms 100% ? ? Impedance: 900 ohms ? ?Seizure Energy Index: [id number] ?V squared ? ?Postictal Suppression Index: 62% ? ?Seizure Concordance Index: 92% ? ?Medications ? ?Pre Shock: Brevital 90 succinylcholine 120 ? ?Post Shock: None ? ?Seizure Duration: 40 seconds EMG 54 seconds EEG ? ? ?Comments: ?He is still on the schedule for Wednesday although he is already showing quite a bit of improvement. ? ?Lungs: ? ?[x]   Clear to auscultation              ? ?[]  Other:  ? ?Heart: ?  ? [x]   Regular rhythm             []  irregular rhythm ? ? ? [x]   Previous H&P reviewed, patient examined and there are NO CHANGES              ?  ? []   Previous H&P reviewed, patient examined and there are changes noted. ? ? ?Alethia Berthold, MD ?4/3/20235:08 PM ? ?  ?

## 2021-10-09 ENCOUNTER — Other Ambulatory Visit: Payer: Self-pay | Admitting: Psychiatry

## 2021-10-09 DIAGNOSIS — G9340 Encephalopathy, unspecified: Secondary | ICD-10-CM | POA: Diagnosis not present

## 2021-10-09 LAB — GLUCOSE, CAPILLARY
Glucose-Capillary: 207 mg/dL — ABNORMAL HIGH (ref 70–99)
Glucose-Capillary: 191 mg/dL — ABNORMAL HIGH (ref 70–99)
Glucose-Capillary: 167 mg/dL — ABNORMAL HIGH (ref 70–99)

## 2021-10-09 MED ORDER — TRACE MINERALS CU-MN-SE-ZN 300-55-60-3000 MCG/ML IV SOLN
INTRAVENOUS | Status: DC
  Filled 2021-10-09: qty 480

## 2021-10-09 NOTE — Evaluation (Signed)
Occupational Therapy Evaluation ?Patient Details ?Name: [name] ?MRN: [id number] ?DOB: 12-06-1955 ?Today's Date: 10/09/2021 ? ? ?History of Present Illness Pt is a 66 y.o. male who presents to the ED from his ALF by EMS due to AMS after being found somnolent and not responding to questions as typical. Currently admitted for acute encephalopathy likely due to his uncontrolled primary psychiatric disorder i.e. schizophrenia. PmHx: Hypothyroid, HLD, HTN, Seizure, Agitation, Schizophrenia.  ? ?Clinical Impression ?  ?[name] presents for generalized weakness, limited endurance, lethargy, AMS. He comes to [facility] from a group home. During today's evaluation, pt presents as considerably more alert than he has been in previous weeks. His speech is soft and garbled, but he is able at times to respond appropriately to questions (e.g., after washing face with warm washcloth, when asked how that feels, he says, "Good. I feel better.") He performs bed mobility quickly and fluidly once given verbal cueing, but does not respond to other directions (e.g., "squeeze my hand"). He states at one point that he thinks he is dying, given reassurance that this is not the case at present. Pt does not appear to be oriented to place, time, or situation; therapist provides cognitive orientation w/ minimal verbage. Recommend ongoing OT while pt is hospitalized, with DC back to group home following determination that staff there are able to oversee medication management.   ? ?Recommendations for follow up therapy are one component of a multi-disciplinary discharge planning process, led by the attending physician.  Recommendations may be updated based on patient status, additional functional criteria and insurance authorization.  ? ?Follow Up Recommendations ? Other (comment) (group home; assisted living)  ?  ?Assistance Recommended at Discharge Frequent or constant Supervision/Assistance  ?Patient can return home with the following A little  help with walking and/or transfers;A lot of help with bathing/dressing/bathroom;Assistance with cooking/housework;Direct supervision/assist for medications management;Assist for transportation;Assistance with feeding ? ?  ?Functional Status Assessment ? Patient has had a recent decline in their functional status and demonstrates the ability to make significant improvements in function in a reasonable and predictable amount of time.  ?Equipment Recommendations ? None recommended by OT  ?  ?Recommendations for Other Services Other (comment) (Inquiry re: group home staff's plan for medication mgmt) ? ? ?  ?Precautions / Restrictions Precautions ?Precautions: Fall ?Restrictions ?Weight Bearing Restrictions: No  ? ?  ? ?Mobility Bed Mobility ?Overal bed mobility: Needs Assistance ?Bed Mobility: Supine to Sit, Sit to Supine ?  ?  ?Supine to sit: Supervision ?  ?  ?General bed mobility comments: Pt able to transfer supine<>sit without physical assistance, did required verbal and tactile cueing for task initiation ?  ? ?Transfers ?  ?  ?  ?  ?  ?  ?  ?  ?  ?General transfer comment: not attempted ?  ? ?  ?Balance Overall balance assessment: Needs assistance ?Sitting-balance support: Bilateral upper extremity supported, Feet supported ?Sitting balance-Leahy Scale: Good ?  ?  ?  ?  ?Standing balance comment: not attempted ?  ?  ?  ?  ?  ?  ?  ?  ?  ?  ?  ?   ? ?ADL either performed or assessed with clinical judgement  ? ?ADL Overall ADL's : Needs assistance/impaired ?  ?  ?Grooming: Wash/dry hands;Wash/dry face;Oral care;Brushing hair;Min guard;Moderate assistance ?Grooming Details (indicate cue type and reason): able to wash face, hands once given cues; required Mod A for brushing hair and oral care ?  ?  ?  ?  ?  ?  ?  ?  ?  ?  ?  ?  ?  ?  ?  ?   ? ? ? ?  Vision   ?   ?   ?Perception   ?  ?Praxis   ?  ? ?Pertinent Vitals/Pain Pain Assessment ?Breathing: normal ?Negative Vocalization: occasional moan/groan, low speech,  negative/disapproving quality ?Facial Expression: smiling or inexpressive ?Body Language: tense, distressed pacing, fidgeting ?Consolability: no need to console ?PAINAD Score: 2 ?Pain Location: pt unable to communicate  ? ? ? ?Hand Dominance   ?  ?Extremity/Trunk Assessment Upper Extremity Assessment ?Upper Extremity Assessment: Overall WFL for tasks assessed ?  ?Lower Extremity Assessment ?Lower Extremity Assessment: Overall WFL for tasks assessed ?  ?  ?  ?Communication Communication ?Communication: Expressive difficulties (garbled, quiet speech) ?  ?Cognition Arousal/Alertness: Lethargic ?Behavior During Therapy: Restless ?Overall Cognitive Status: No family/caregiver present to determine baseline cognitive functioning ?  ?  ?  ?  ?  ?  ?  ?  ?  ?Orientation Level: Disoriented to, Place, Time, Situation ?  ?  ?Following Commands: Follows one step commands inconsistently ?  ?  ?Problem Solving: Slow processing, Requires verbal cues, Requires tactile cues, Difficulty sequencing ?General Comments: Pt awake, occassionally able to follow one-step directions and to respond appropriately to questions ?  ?  ?General Comments    ? ?  ?Exercises Other Exercises ?Other Exercises: reassurance, therapeutic presence, grooming, bed mobility, repositioning for comfort ?  ?Shoulder Instructions    ? ? ?Home Living Family/patient expects to be discharged to:: Group home ?Living Arrangements: Non-relatives/Friends;Other (Comment) (family care home) ?Available Help at Discharge: Other (Comment);Available 24 hours/day (group home staff) ?Type of Home: Group Home ?Home Access: Level entry ?  ?  ?Home Layout: One level ?  ?  ?  ?  ?  ?  ?  ?Home Equipment: None ?  ?Additional Comments: PLOF info from chart review -- pt unable to provide. ?  ? ?  ?Prior Functioning/Environment Prior Level of Function : Needs assist ?  ?  ?  ?  ?  ?  ?Mobility Comments: pt unable to communicate PLOF ?ADLs Comments: pt unable to communicate PLOF ?  ? ?  ?   ?OT Problem List: Decreased cognition;Decreased activity tolerance;Decreased safety awareness ?  ?   ?OT Treatment/Interventions: Self-care/ADL training;Therapeutic exercise;Patient/family education;Energy conservation;Therapeutic activities;Balance training  ?  ?OT Goals(Current goals can be found in the care plan section) Acute Rehab OT Goals ?Patient Stated Goal: for god to be with him ?OT Goal Formulation: With patient ?Time For Goal Achievement: 10/23/21 ?Potential to Achieve Goals: Good ?ADL Goals ?Pt Will Perform Grooming: with set-up;with supervision;sitting ?Pt Will Perform Upper Body Dressing: sitting;with set-up;with min guard assist (with multimodal cueing) ?Additional ADL Goal #1: Pt will articulate to health care provider how he is feeling and whether or not he is having pain  ?OT Frequency: Min 2X/week ?  ? ?Co-evaluation   ?  ?  ?  ?  ? ?  ?AM-PAC OT "6 Clicks" Daily Activity     ?Outcome Measure Help from another person eating meals?: A Lot ?Help from another person taking care of personal grooming?: A Lot ?Help from another person toileting, which includes using toliet, bedpan, or urinal?: A Lot ?Help from another person bathing (including washing, rinsing, drying)?: A Lot ?Help from another person to put on and taking off regular upper body clothing?: A Lot ?Help from another person to put on and taking off regular lower body clothing?: A Lot ?6 Click Score: 12 ?  ?End of Session   ? ?Activity Tolerance: Patient tolerated treatment well ?Patient left: in bed;with  call bell/phone within reach;with bed alarm set;with nursing/sitter in room ? ?OT Visit Diagnosis: Unsteadiness on feet (R26.81);Muscle weakness (generalized) (M62.81);Other symptoms and signs involving cognitive function  ?              ?Time: [id number] ?OT Time Calculation (min): 18 min ?Charges:  OT General Charges ?$OT Visit: 1 Visit ?OT Evaluation ?$OT Eval Moderate Complexity: 1 Mod ?OT Treatments ?$Self Care/Home Management : 8-22  mins ?Latina CraverKimberly Khair Chasteen, PhD, MS, OTR/L ?10/09/21, 2:33 PM ? ?

## 2021-10-09 NOTE — Progress Notes (Signed)
?PROGRESS NOTE ? ? ? ?[name]  [id number] DOB: [birth year] DOA: 09/17/2021 ?PCP: Terrill Mohr, NP  ? ? ?Brief Narrative:  ?66 year old male with medical history significant for schizophrenia, hypertension, hyperlipidemia, hypothyroid, medication noncompliance, recurrent seizures, tardive dyskinesia, was brought in from ALF by EMS to ED with altered mental status after he was found to be somnolent, not responding to questions as typical and with right eye drainage.  EMS reported that patient was found on the floor by staff at ALF on 3/13 morning.  Per EDP note, patient was not taking his medications.  In ED, agitated and combative and received IV Haldol 5 mg x 2 and Ativan 2 mg IV x2 on night of arrival.  Admitted for acute encephalopathy likely due to his uncontrolled primary psychiatric disorder i.e. schizophrenia.   ?  ?Psychiatry and neurology consulted.  Patient was given long-acting Haldol.  Depakote level was noted to be elevated and Depakote dose was changed.   ?  ?3/21: Patient's mentation worsened over few days.  Was noted to be febrile with new cough.  Started on antibiotics for possible aspiration pneumonia, completed course of Unasyn.  SLP evaluated swallow and deemed patient severe aspiration risk, patient made NPO.  Dietitian following.   Unable to place NG tube, pt does not swallow on command.  Now on TPN, started 3/27.  ?  ?3/22 >> persistent severe encephalopathy, apneic spells but has been overall stable. ?  ?3/28: stable, slight improvement since starting TPN and IV thiamine. He made eye contact today and attempted to speak to me (mumbled), first interaction I've seen from him, although it was brief ? ?Week 4//30 to 4/4: ?Started on ECT. On TPN, started on dysphagia 1 diet, and transitioning to get off TPN now tolerating diet. Still confused. Next ECT on 4/5 ? ? ?Consultants:  ?psychiatry ? ?Procedures:  ? ?Antimicrobials:  ?  ? ? ?Subjective: ?Denies sob, cp ? ?Objective: ?Vitals:  ?  10/08/21 0806 10/08/21 1932 10/09/21 0414 10/09/21 0727  ?BP: 106/60 133/72 110/64 112/64  ?Pulse: 100 97 (!) 108 (!) 104  ?Resp: 16 18 18 17   ?Temp: 98.7 ?F (37.1 ?C) 97.8 ?F (36.6 ?C) 98 ?F (36.7 ?C) 99.4 ?F (37.4 ?C)  ?TempSrc: Axillary     ?SpO2: 98% 97% 99% 98%  ?Weight:      ?Height:      ? ? ?Intake/Output Summary (Last 24 hours) at 10/09/2021 0813 ?Last data filed at 10/09/2021 0343 ?Gross per 24 hour  ?Intake 2569.2 ml  ?Output 1300 ml  ?Net 1269.2 ml  ? ?Filed Weights  ? 10/06/21 0322 10/07/21 0350 10/08/21 0400  ?Weight: 87.6 kg 91.6 kg 90.1 kg  ? ? ?Examination: ?Calm, NAD ?Cta no w/r ?Reg s1/s2 no gallop ?Soft benign +bs ?No edema ?Awake alert. Confused. Grossly intact ?Mood and affect appropriate in current setting  ? ? ? ?Data Reviewed: I have personally reviewed following labs and imaging studies ? ?CBC: ?Recent Labs  ?Lab 10/04/21 ?0325  ?WBC 7.0  ?HGB 10.8*  ?HCT 33.5*  ?MCV 86.6  ?PLT 423*  ? ?Basic Metabolic Panel: ?Recent Labs  ?Lab 10/03/21 ?0300 10/04/21 ?0434 10/06/21 ?[id number] 10/08/21 ?[id number]  ?NA 140 139 144 140  ?K 4.1 4.2 4.2 4.4  ?CL 104 105 106 106  ?CO2 29 28 30 29   ?GLUCOSE 143* 166* 246* 209*  ?BUN 21 24* 26* 26*  ?CREATININE 0.79 0.76 0.75 0.71  ?CALCIUM 8.4* 8.4* 8.5* 8.0*  ?MG 1.9 1.8 2.2 1.9  ?PHOS 3.3  3.2  --  3.1  ? ?GFR: ?Estimated Creatinine Clearance: 108.6 mL/min (by C-G formula based on SCr of 0.71 mg/dL). ?Liver Function Tests: ?Recent Labs  ?Lab 10/04/21 ?0434 10/08/21 ?[id number]  ?AST 31 26  ?ALT 34 36  ?ALKPHOS 46 45  ?BILITOT 0.5 0.4  ?PROT 6.5 6.0*  ?ALBUMIN 2.6* 2.5*  ? ?No results for input(s): LIPASE, AMYLASE in the last 168 hours. ?No results for input(s): AMMONIA in the last 168 hours. ?Coagulation Profile: ?No results for input(s): INR, PROTIME in the last 168 hours. ?Cardiac Enzymes: ?No results for input(s): CKTOTAL, CKMB, CKMBINDEX, TROPONINI in the last 168 hours. ? ?BNP (last 3 results) ?No results for input(s): PROBNP in the last 8760 hours. ?HbA1C: ?No results for  input(s): HGBA1C in the last 72 hours. ?CBG: ?Recent Labs  ?Lab 10/08/21 ?1539 10/08/21 ?1931 10/08/21 ?2345 10/09/21 ?[id number] 10/09/21 ?0734  ?GLUCAP 273* 292* 324* 207* 191*  ? ?Lipid Profile: ?Recent Labs  ?  10/08/21 ?[id number]  ?TRIG 53  ? ? ?Thyroid Function Tests: ?No results for input(s): TSH, T4TOTAL, FREET4, T3FREE, THYROIDAB in the last 72 hours. ?Anemia Panel: ?No results for input(s): VITAMINB12, FOLATE, FERRITIN, TIBC, IRON, RETICCTPCT in the last 72 hours. ?Sepsis Labs: ?No results for input(s): PROCALCITON, LATICACIDVEN in the last 168 hours. ? ?No results found for this or any previous visit (from the past 240 hour(s)).  ? ? ? ? ? ?Radiology Studies: ?No results found. ? ? ? ? ? ?Scheduled Meds: ? Chlorhexidine Gluconate Cloth  6 each Topical Daily  ? cloNIDine  0.1 mg Transdermal Weekly  ? enoxaparin (LOVENOX) injection  40 mg Subcutaneous Q24H  ? feeding supplement  237 mL Oral TID BM  ? folic acid  1 mg Intravenous Daily  ? haloperidol decanoate  200 mg Intramuscular Q30 days  ? insulin aspart  0-9 Units Subcutaneous Q4H  ? levothyroxine  75 mcg Intravenous Daily  ? mouth rinse  15 mL Mouth Rinse BID  ? multivitamin with minerals  1 tablet Oral Daily  ? OLANZapine  20 mg Oral QHS  ? sodium chloride flush  10-40 mL Intracatheter Q12H  ? ?Continuous Infusions: ? sodium chloride 10 mL/hr at 10/08/21 1259  ? TPN ADULT (ION) 100 mL/hr at 10/08/21 1816  ? ? ?Assessment & Plan: ?  ?Principal Problem: ?  Acute encephalopathy ?Active Problems: ?  Aspiration pneumonia ([place]) ?  Schizophrenia ([place]) ?  Hypernatremia ?  Impaired nutrition ?  Protein-calorie malnutrition, severe ?  Seizure ([place]) ?  HTN (hypertension) ?  Acute conjunctivitis, right eye ?  Diabetes ([place]) ?  Hyperlipidemia ?  Hypothyroid ?  Noncompliance ?  Hyperkalemia ? ? ?Acute encephalopathy ?Initial medical work-up largely unremarkable as below: ?No CO2 retention on VBG ?CMP, CBC, TSH, ammonia level, flu and COVID-19 RT-PCR all unremarkable. ?Chest  x-ray and CT head without acute abnormalities. ?Urine studies are pending.   ?Depakote was changed to IV.   ?Patient started on Zyprexa intramuscularly.  Patient also given a dose of Haldol decanoate. ? -Patient became more unresponsive on 3/16.  He was noted to be febrile.  Concern was for aspiration pneumonia since patient developed a cough.  COVID-19 test was previously negative.   ?He was also found to have elevated valproic acid level raising concern for valproic acid toxicity.  Ammonia level was also elevated thought to be from Depakote as well.  Depakote dosage was adjusted.  He is on Keppra but had very low dose, now d/c'd.   ?Chest x-ray  was repeated.  ABG was done which showed elevated PCO2 with normal pH suggesting chronic respiratory failure with compensation.  CT head was also repeated which does not show any new findings.  Depakote level remains normal.  ?MRI brain does not show any acute findings.  ?EEG is showed only diffuse slowing, no seizure activity. ?--Neurology consulted due to lack of improvement - now signed off   ?--Psych resumed olanzapine after no changes with it off ?--Follow up psych and neurology recommendations  ?--Delirium precautions ?--Sitter as needed for safety ?--Neurochecks ?Psychiatry spoke to legal guardian representative at [place] and discussed ECT treatment.   ?Plan to start electroconvulsive therapy tomorrow  ?4/1 ECT yesterday by psychiatry  ?psychiatry recommends keeping him off of benzodiazepine and anticonvulsants while having ECT  ?4/3 ECT today  ?4/4 more interactive. Plan for ECT tomorrow. Confused ? ? ? ?  ?Aspiration pneumonia ([place]) ?Acute respiratory failure with hypoxia due to pneumonia- resolved ?Respiratory status overall stable, on room air.   Lungs clear.  Patient does appear to have increased work of breathing which at this point seems related to his metabolic encephalopathy. ?4/4 completed Unasyn 7 day course ?SPL  placed pt on dysphagia 1 diet and tolerating ? ? ? ? ?  ?  ?Protein-calorie malnutrition, severe ?Due to acute illness as evidenced by moderate fat depletion, moderate muscle depletion, severe muscle depletion,

## 2021-10-09 NOTE — Progress Notes (Signed)
PHARMACY - TOTAL PARENTERAL NUTRITION CONSULT NOTE  ? ?Indication:  Intolerance to enteral feeding ? ?Patient Measurements: ?Height: 6\' 3"  (190.5 cm) ?Weight: 90.1 kg (198 lb 10.2 oz) ?IBW/kg (Calculated) : 84.5 ?TPN AdjBW (KG): 87.6 ?Body mass index is 24.83 kg/m?. ? ?Assessment: 66 year old male with medical history significant for schizophrenia, hypertension, hyperlipidemia, hypothyroid, medication noncompliance, recurrent seizures, tardive dyskinesia, was brought in from ALF by EMS to ED with altered mental status. Pharmacy has been consulted for TPN.  ? ?Glucose / Insulin: BG 292 > 148 on SSI(23u in last 24hr) ?Electrolytes: WNL ?Renal: Scr <1, stable ?Hepatic: WNL ?Intake / Output; MIVF: +7.72 L ?GI Imaging: none recently ?GI Surgeries / Procedures: none recently ? ?Central access: PICC line placed 3/26 ?TPN start date: 3/27 ? ?Nutritional Goals: ?Goal TPN rate is 100 mL/hr (provides 144 g of protein and 2803 kcals per day) ? ?RD Assessment: ?Estimated Needs ?Total Energy Estimated Needs: [id number] ?Total Protein Estimated Needs: 130-145 grams ?Total Fluid Estimated Needs: > 2 L ? ?Current Nutrition:  ?NPO ? ?Plan:  ?Will decrease TPN to 50 mL/hr(half rate, total volume including overfill 1250 mL) ?Nutritional components ?Amino acids (using 15% Clinisol): 72 grams ?Dextrose: 204 grams ?Lipids (using 20% SMOFlipids): 42 grams ?kCal: 2803 ?Electrolytes in TPN: Na 53mEq/L, K 10mEq/L, Ca 54mEq/L, Mg 9mEq/L, and Phos 13mmol/L. Cl:Ac 1:1 ?Will decrease insulin in bag to 5 units along with SSI ?Add standard MVI and trace elements to TPN ?Completed thiamine course 100 mg x 5 days  ?Monitor TPN labs on Mon/Thurs ? ?Pearla Dubonnet, PharmD ?Clinical Pharmacist ?10/09/2021 ?12:16 PM ? ? ?

## 2021-10-09 NOTE — TOC Progression Note (Signed)
Transition of Care (TOC) - Progression Note  ? ? ?Patient Details  ?Name: [name] ?MRN: [id number] ?Date of Birth: January 12, 1956 ? ?Transition of Care (TOC) CM/SW Contact  ?Chapman Fitch, RN ?Phone Number: ?10/09/2021, 11:56 AM ? ?Clinical Narrative:    ? ? ?Plan for ECT tomorrow.  ?New PT and OT eval pending ?Expected Discharge Plan: Group Home ?Barriers to Discharge: Continued Medical Work up ? ?Expected Discharge Plan and Services ?Expected Discharge Plan: Group Home ?  ?Discharge Planning Services: CM Consult ?Post Acute Care Choice: Home Health ?Living arrangements for the past 2 months: Group Home ?                ?  ?  ?  ?  ?  ?  ?  ?  ?  ?  ? ? ?Social Determinants of Health (SDOH) Interventions ?  ? ?Readmission Risk Interventions ?   ? View : No data to display.  ?  ?  ?  ? ? ?

## 2021-10-09 NOTE — Progress Notes (Signed)
PT Cancellation Note ? ?Patient Details ?Name: [name] ?MRN: [id number] ?DOB: 06-17-1956 ? ? ?Cancelled Treatment:    Reason Eval/Treat Not Completed: Other (comment). Evaluation attempted, however pt doesn't engage with therapist and rolls over when covers removed. Multiple attempts to assist pt OOB, however begins to get agitated and therapist ended session. Sitter at bedside. ? ? ?Zaiya Annunziato ?10/09/2021, 3:52 PM ?Greggory Stallion, PT, DPT, GCS ?[phone] ? ?

## 2021-10-10 ENCOUNTER — Inpatient Hospital Stay: Payer: Medicare Other | Admitting: Certified Registered Nurse Anesthetist

## 2021-10-10 ENCOUNTER — Ambulatory Visit (HOSPITAL_COMMUNITY)
Admit: 2021-10-10 | Discharge: 2021-10-10 | Disposition: A | Discharge status: Home / Self Care | Payer: Medicare Other | Attending: Psychiatry | Admitting: Psychiatry

## 2021-10-10 DIAGNOSIS — I10 Essential (primary) hypertension: Secondary | ICD-10-CM | POA: Diagnosis not present

## 2021-10-10 DIAGNOSIS — G9340 Encephalopathy, unspecified: Secondary | ICD-10-CM | POA: Diagnosis not present

## 2021-10-10 DIAGNOSIS — F203 Undifferentiated schizophrenia: Secondary | ICD-10-CM | POA: Diagnosis not present

## 2021-10-10 DIAGNOSIS — E43 Unspecified severe protein-calorie malnutrition: Secondary | ICD-10-CM | POA: Diagnosis not present

## 2021-10-10 LAB — GLUCOSE, CAPILLARY
Glucose-Capillary: 88 mg/dL (ref 70–99)
Glucose-Capillary: 194 mg/dL — ABNORMAL HIGH (ref 70–99)

## 2021-10-10 MED ORDER — LABETALOL HCL 5 MG/ML IV SOLN
INTRAVENOUS | Status: DC | PRN
  Administered 2021-10-10: 10 mg via INTRAVENOUS

## 2021-10-10 MED ORDER — DIPHENHYDRAMINE HCL 50 MG/ML IJ SOLN
50.0000 mg | Freq: Once | INTRAMUSCULAR | Status: AC
  Administered 2021-10-10: 50 mg via INTRAVENOUS

## 2021-10-10 MED ORDER — HALOPERIDOL LACTATE 5 MG/ML IJ SOLN: 5.0000 mg | Freq: Once | INTRAMUSCULAR | Status: DC

## 2021-10-10 MED ORDER — METHOHEXITAL SODIUM 100 MG/10ML IV SOSY
PREFILLED_SYRINGE | INTRAVENOUS | Status: DC | PRN
  Administered 2021-10-10: 90 mg via INTRAVENOUS

## 2021-10-10 MED ORDER — SODIUM CHLORIDE 0.9 % IV SOLN: 500.0000 mL | Freq: Once | INTRAVENOUS | Status: DC

## 2021-10-10 MED ORDER — SODIUM CHLORIDE 0.9 % IV SOLN: INTRAVENOUS | Status: DC | PRN

## 2021-10-10 MED ORDER — LEVOTHYROXINE SODIUM 150 MCG PO TABS
150.0000 ug | ORAL_TABLET | Freq: Every day | ORAL | Status: DC
  Administered 2021-10-12 – 2021-10-22 (×8): 150 ug via ORAL
  Filled 2021-10-10 (×12): qty 1

## 2021-10-10 MED ORDER — FENTANYL CITRATE (PF) 100 MCG/2ML IJ SOLN: 25.0000 ug | INTRAMUSCULAR | Status: DC | PRN

## 2021-10-10 MED ORDER — METOPROLOL TARTRATE 25 MG PO TABS
12.5000 mg | ORAL_TABLET | Freq: Two times a day (BID) | ORAL | Status: DC
  Administered 2021-10-12 – 2021-10-18 (×9): 12.5 mg via ORAL
  Filled 2021-10-10 (×16): qty 1

## 2021-10-10 MED ORDER — SUCCINYLCHOLINE CHLORIDE 200 MG/10ML IV SOSY
PREFILLED_SYRINGE | INTRAVENOUS | Status: AC
  Filled 2021-10-10: qty 10

## 2021-10-10 MED ORDER — KETAMINE HCL 10 MG/ML IJ SOLN
INTRAMUSCULAR | Status: DC | PRN
  Administered 2021-10-10: 120 mg via INTRAVENOUS

## 2021-10-10 MED ORDER — ONDANSETRON HCL 4 MG/2ML IJ SOLN
4.0000 mg | Freq: Once | INTRAMUSCULAR | Status: DC | PRN
Start: 2021-10-10 — End: 2021-10-11

## 2021-10-10 MED ORDER — INSULIN ASPART 100 UNIT/ML IJ SOLN
0.0000 [IU] | Freq: Three times a day (TID) | INTRAMUSCULAR | Status: DC
  Administered 2021-10-14 (×2): 2 [IU] via SUBCUTANEOUS
  Administered 2021-10-15: 3 [IU] via SUBCUTANEOUS
  Administered 2021-10-15: 2 [IU] via SUBCUTANEOUS
  Filled 2021-10-10 (×7): qty 1

## 2021-10-10 MED ORDER — LABETALOL HCL 5 MG/ML IV SOLN
INTRAVENOUS | Status: AC
  Filled 2021-10-10: qty 4

## 2021-10-10 MED ORDER — INSULIN ASPART 100 UNIT/ML IJ SOLN
0.0000 [IU] | Freq: Every day | INTRAMUSCULAR | Status: DC
  Administered 2021-10-19: 2 [IU] via SUBCUTANEOUS
  Filled 2021-10-10: qty 1

## 2021-10-10 MED ORDER — DIPHENHYDRAMINE HCL 50 MG/ML IJ SOLN
INTRAMUSCULAR | Status: AC
  Filled 2021-10-10: qty 1

## 2021-10-10 NOTE — H&P (Signed)
[name] is an 66 y.o. male.   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 ?Past Medical History:  ?Diagnosis Date  ? Diabetes mellitus without complication (HCC)   ? HTN (hypertension)   ? Hyperammonemia (HCC) While on Depakote  ? Hyperlipemia   ? Lithium toxicity   ? Neutropenia (HCC)   ? Schizophrenia (HCC)   ? Seizure (HCC) While toxic on Depakote  ? Sickle cell trait (HCC)   ? Thyroid disease   ? TIA (transient ischemic attack) in 2009  ? ? ?History reviewed. No pertinent surgical history. ? ?History reviewed. No pertinent family history. ?Social History:  reports that he has never smoked. He has never used smokeless tobacco. He reports that he does not currently use drugs. He reports that he does not drink alcohol. ? ?Allergies:  ?Allergies  ?Allergen Reactions  ? Depakote [Valproic Acid] Other (See Comments)  ?  Reaction:  Hyperammonemia   ? Lithium Other (See Comments)  ?  Pt has a prior history of Lithium toxicity.     ? ? ?(Not in a hospital admission) ? ? ?Results for orders placed or performed during the hospital encounter of 09/17/21 (from the past 48 hour(s))  ?Glucose, capillary     Status: Abnormal  ? Collection Time: 10/08/21  3:39 PM  ?Result Value Ref Range  ? Glucose-Capillary 273 (H) 70 - 99 mg/dL  ?  Comment: Glucose reference range applies only to samples taken after fasting for at least 8 hours.  ?Glucose, capillary     Status: Abnormal  ? Collection Time: 10/08/21  7:31 PM  ?Result Value Ref Range  ? Glucose-Capillary 292 (H) 70 - 99 mg/dL  ?  Comment: Glucose reference range applies only to samples taken after fasting for at least 8 hours.  ?Glucose, capillary     Status: Abnormal  ? Collection Time: 10/08/21 11:45 PM  ?Result Value Ref Range  ? Glucose-Capillary 324 (H) 70 - 99 mg/dL  ?  Comment: Glucose reference range applies only to samples taken after fasting for at least 8  hours.  ?Glucose, capillary     Status: Abnormal  ? Collection Time: 10/09/21  4:15 AM  ?Result Value Ref Range  ? Glucose-Capillary 207 (H) 70 - 99 mg/dL  ?  Comment: Glucose reference range applies only to samples taken after fasting for at least 8 hours.  ?Glucose, capillary     Status: Abnormal  ? Collection Time: 10/09/21  7:34 AM  ?Result Value Ref Range  ? Glucose-Capillary 191 (H) 70 - 99 mg/dL  ?  Comment: Glucose reference range applies only to samples taken after fasting for at least 8 hours.  ?Glucose, capillary     Status: None  ? Collection Time: 10/09/21  4:03 PM  ?Result Value Ref Range  ? Glucose-Capillary 88 70 - 99 mg/dL  ?  Comment: Glucose reference range applies only to samples taken after fasting for at least 8 hours.  ?Glucose, capillary     Status: Abnormal  ? Collection Time: 10/09/21  7:32 PM  ?Result Value Ref Range  ? Glucose-Capillary 194 (H) 70 - 99 mg/dL  ?  Comment: Glucose reference range applies only to samples taken after fasting for at least 8 hours.  ? ?No results found. ? ?Review of Systems  ?Constitutional: Negative.   ?HENT: Negative.    ?Eyes: Negative.   ?Respiratory: Negative.    ?Cardiovascular: Negative.   ?  Gastrointestinal: Negative.   ?Musculoskeletal: Negative.   ?Skin: Negative.   ?Neurological: Negative.   ?Psychiatric/Behavioral:  Positive for confusion and decreased concentration. Negative for agitation and behavioral problems. The patient is nervous/anxious.   ?All other systems reviewed and are negative. ? ?Blood pressure 111/65, pulse 93, temperature (!) 97.1 ?F (36.2 ?C), temperature source Tympanic, resp. rate 15, height 6\' 3"  (1.905 m), weight 90.1 kg, SpO2 100 %. ?Physical Exam ?Vitals and nursing note reviewed.  ?Constitutional:   ?   Appearance: He is well-developed.  ?HENT:  ?   Head: Normocephalic and atraumatic.  ?Eyes:  ?   Conjunctiva/sclera: Conjunctivae normal.  ?   Pupils: Pupils are equal, round, and reactive to light.  ?Cardiovascular:  ?    Heart sounds: Normal heart sounds.  ?Pulmonary:  ?   Effort: Pulmonary effort is normal.  ?Abdominal:  ?   Palpations: Abdomen is soft.  ?Musculoskeletal:     ?   General: Normal range of motion.  ?   Cervical back: Normal range of motion.  ?Skin: ?   General: Skin is warm and dry.  ?Neurological:  ?   General: No focal deficit present.  ?   Mental Status: He is alert.  ?Psychiatric:     ?   Attention and Perception: He is inattentive.     ?   Mood and Affect: Affect is blunt.     ?   Speech: Speech is tangential.     ?   Behavior: Behavior is withdrawn.     ?   Thought Content: Thought content is delusional.     ?   Cognition and Memory: Cognition is impaired.  ?  ? ?Assessment/Plan ?Patient is showing much improvement.  Still psychotic symptoms but behavior much improved.  Probably nearing his baseline.  We will continue with ECT today but this could possibly be his last ECT treatment at this time ? ? , MD ?10/10/2021, 11:31 AM ? ? ? ?

## 2021-10-10 NOTE — Plan of Care (Signed)
?  Problem: Education: ?Goal: Knowledge of General Education information will improve ?Description: Including pain rating scale, medication(s)/side effects and non-pharmacologic comfort measures ?Outcome: Not Progressing ?  ?Problem: Health Behavior/Discharge Planning: ?Goal: Ability to manage health-related needs will improve ?Outcome: Not Progressing ?  ?Problem: Clinical Measurements: ?Goal: Ability to maintain clinical measurements within normal limits will improve ?Outcome: Progressing ?Goal: Will remain free from infection ?Outcome: Progressing ?Goal: Diagnostic test results will improve ?Outcome: Progressing ?Goal: Respiratory complications will improve ?Outcome: Progressing ?Goal: Cardiovascular complication will be avoided ?Outcome: Progressing ?  ?Problem: Activity: ?Goal: Risk for activity intolerance will decrease ?Outcome: Progressing ?  ?Problem: Nutrition: ?Goal: Adequate nutrition will be maintained ?Outcome: Progressing ?  ?Problem: Pain Managment: ?Goal: General experience of comfort will improve ?Outcome: Progressing ?  ?Problem: Safety: ?Goal: Ability to remain free from injury will improve ?Outcome: Progressing ?  ?Problem: Skin Integrity: ?Goal: Risk for impaired skin integrity will decrease ?Outcome: Progressing ?  ?Problem: Safety: ?Goal: Non-violent Restraint(s) ?Outcome: Progressing ?  ?
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
## 2021-10-10 NOTE — Progress Notes (Signed)
PT Cancellation Note ? ?Patient Details ?Name: [name] ?MRN: [id number] ?DOB: [birth year] ? ? ?Cancelled Treatment:    Reason Eval/Treat Not Completed: Other (comment). Pt currently out of room at this time for ECT. Will re-attempt next available date. ? ? ?Lea Walbert ?10/10/2021, 10:45 AM ?Elizabeth Palau, PT, DPT, GCS ?[phone] ? ?

## 2021-10-10 NOTE — Procedures (Signed)
ECT SERVICES ?Physician?s Interval Evaluation & Treatment Note ? ?Patient Identification: [name] ?MRN:  [id number] ?Date of Evaluation:  10/10/2021 ?TX #: 3 ? ?MADRS:  ? ?MMSE:  ? ?P.E. Findings: ? ?More alert and energetic but otherwise no change to physical exam.  Cleaned up a little bit it looks like ? ?Psychiatric Interval Note: ? ?Still disorganized and psychotic as usual.  Behaving a little better.  Not hostile or aggressive.  Responds to direct requests. ? ?Subjective: ? ?Patient is a 66 y.o. male seen for evaluation for Electroconvulsive Therapy. ?Patient has no particular complaint other than his desire to go home ? ?Treatment Summary: ? ? []   Right Unilateral             [x]  Bilateral ?  ?% Energy : 1.0 ms 100% ? ? Impedance: 1220 ohms ? ?Seizure Energy Index: 3019 ?V squared ? ?Postictal Suppression Index: No reading ? ?Seizure Concordance Index: 92% ? ?Medications ? ?Pre Shock: Brevital 90 mg succinylcholine 120 mg ? ?Post Shock: Benadryl 50 mg which was given because he was having a lot of itching on his legs ? ?Seizure Duration: 23 seconds EMG 44 seconds EEG ? ? ?Comments: ?I think this is probably as much ECT as he needs he seems to be returning to baseline and I would recommend removing the PICC line and working towards discharge ? ?Lungs: ? ?[x]   Clear to auscultation              ? ?[]  Other:  ? ?Heart: ?  ? [x]   Regular rhythm             []  irregular rhythm ? ? ? [x]   Previous H&P reviewed, patient examined and there are NO CHANGES              ?  ? []   Previous H&P reviewed, patient examined and there are changes noted. ? ? ? , MD ?4/5/20233:31 PM ? ?  ?

## 2021-10-10 NOTE — Anesthesia Postprocedure Evaluation (Signed)
Anesthesia Post Note ? ?Patient: [name] ? ?Procedure(s) Performed: ECT TX ? ?Patient location during evaluation: PACU ?Anesthesia Type: General ?Level of consciousness: awake and alert ?Pain management: pain level controlled ?Vital Signs Assessment: post-procedure vital signs reviewed and stable ?Respiratory status: spontaneous breathing, nonlabored ventilation, respiratory function stable and patient connected to nasal cannula oxygen ?Cardiovascular status: blood pressure returned to baseline and stable ?Postop Assessment: no apparent nausea or vomiting ?Anesthetic complications: no ? ? ?No notable events documented. ? ? ?Last Vitals:  ?Vitals:  ? 10/10/21 1319 10/10/21 1330  ?BP: 130/89 (!) 156/96  ?Pulse: 87 97  ?Resp: 19 18  ?Temp: (!) 36.2 ?C   ?SpO2: 100% 97%  ?  ?Last Pain:  ?Vitals:  ? 10/10/21 1319  ?TempSrc:   ?PainSc: Asleep  ? ? ?  ?  ?  ?  ?  ?  ? ?Corinda Gubler ? ? ? ? ?

## 2021-10-10 NOTE — Progress Notes (Signed)
Nutrition Follow Up Note  ? ?DOCUMENTATION CODES:  ? ?Severe malnutrition in context of acute illness/injury ? ?INTERVENTION:  ? ?Ensure Enlive po TID, each supplement provides 350 kcal and 20 grams of protein. ? ?Ice cream with meal trays  ? ?MVI po daily  ? ?NUTRITION DIAGNOSIS:  ? ?Severe Malnutrition related to acute illness as evidenced by moderate fat depletion, moderate muscle depletion, severe muscle depletion,  5 percent weight loss in 1 week, energy intake < or equal to 50% for > or equal to 5 days. ? ?GOAL:  ? ?Patient will meet greater than or equal to 90% of their needs ?-met with TPN ? ?MONITOR:  ? ?PO intake, Supplement acceptance, Labs, Weight trends, Skin, I & O's ? ?ASSESSMENT:  ? ?66 year old male with history of schizophrenia, DM, hypertension, hyperlipidemia, hypothyroid, noncompliance with medications, recurrent seizures AND tardive dyskinesia who presents emergency department for chief concerns of altered mental status and aspiration PNA. ? ?Pt confused today and is unable to provide any reliable history. ECT initiated on 3/31 and pt is tolerating well. Pt NPO today for planned ECT. TPN initiated on 3/27 as NGT unable to be placed; pt tolerated well. Pt initiated on a dysphagia 1/thin liquid diet on 4/3. Pt is documented to be eating 100% of meals and is drinking Ensure supplements. Will request SLP evaluation to see if pt can be advanced past a pureed diet as his mental status is improved. TPN reduced to half rate yesterday; plan is to discontinue today after the current bag runs out. Per chart, pt is down ~16lbs since admission. No BM since 4/2; MD aware.  ? ?Medications reviewed and include: lovenox, folic acid, haldol, insulin, synthroid, MVI  ? ?Labs reviewed: K 4.4 wnl, BUN 26(H), P 3.1 wnl, Mg 1.9 wnl ?Cbgs- 194, 88, 167, 191, 207 x 24 hrs ? ?Diet Order:   ?Diet Order   ? ?       ?  Diet NPO time specified  Diet effective midnight       ?  ? ?  ?  ? ?  ? ?EDUCATION NEEDS:  ? ?No  education needs have been identified at this time ? ?Skin:  Skin Assessment: Reviewed RN Assessment ? ?Last BM:  4/2- type 2 ? ?Height:  ? ?Ht Readings from Last 1 Encounters:  ?10/06/21 _0  (1.905 m)  ? ? ?Weight:  ? ?Wt Readings from Last 1 Encounters:  ?10/08/21 90.1 kg  ? ? ?Ideal Body Weight:  89 kg ? ?BMI:  Body mass index is 24.83 kg/m?. ? ?Estimated Nutritional Needs:  ? ?Kcal:  2400-2700kcal/day ? ?Protein:  >120g/day ? ?Fluid:  2.3-2.6L/day ? ?Koleen Distance MS, RD, LDN ?Please refer to AMION for RD and/or RD on-call/weekend/after hours pager ? ?

## 2021-10-10 NOTE — Anesthesia Preprocedure Evaluation (Signed)
Anesthesia Evaluation  ?Patient identified by MRN, date of birth, ID band ?Patient confused ? ? ? ?Reviewed: ?Allergy & Precautions, H&P , NPO status , Patient's Chart, lab work & pertinent test results, reviewed documented beta blocker date and time  ? ?Airway ?Mallampati: II ? ?TM Distance: >3 FB ?Neck ROM: full ? ? ? Dental ? ?(+) Poor Dentition, Missing, Chipped ?  ?Pulmonary ?pneumonia, resolved,  ?  ?Pulmonary exam normal ? ? ? ? ? ? ? Cardiovascular ?Exercise Tolerance: Good ?hypertension, On Medications ?Normal cardiovascular exam ?Rhythm:regular Rate:Normal ? ? ?  ?Neuro/Psych ?Seizures -,  PSYCHIATRIC DISORDERS Schizophrenia TIA  ? GI/Hepatic ?negative GI ROS, Neg liver ROS,   ?Endo/Other  ?diabetesHypothyroidism  ? Renal/GU ?Renal disease  ?negative genitourinary ?  ?Musculoskeletal ? ? Abdominal ?  ?Peds ? Hematology ?negative hematology ROS ?(+)   ?Anesthesia Other Findings ?Past Medical History: ?No date: Diabetes mellitus without complication (HCC) ?No date: HTN (hypertension) ?While on Depakote: Hyperammonemia (HCC) ?No date: Hyperlipemia ?No date: Lithium toxicity ?No date: Neutropenia (HCC) ?No date: Schizophrenia [facility]) ?While toxic on Depakote: Seizure (HCC) ?No date: Sickle cell trait (HCC) ?No date: Thyroid disease ?in 2009: TIA (transient ischemic attack) ?No past surgical history on file. ? ? Reproductive/Obstetrics ?negative OB ROS ? ?  ? ? ? ? ? ? ? ? ? ? ? ? ? ?  ?  ? ? ? ? ? ? ? ? ?Anesthesia Physical ? ?Anesthesia Plan ? ?ASA: 3 ? ?Anesthesia Plan: General  ? ?Post-op Pain Management:   ? ?Induction: Intravenous ? ?PONV Risk Score and Plan: 2 and Ondansetron and TIVA ? ?Airway Management Planned: Mask ? ?Additional Equipment: None ? ?Intra-op Plan:  ? ?Post-operative Plan:  ? ?Informed Consent: I have reviewed the patients History and Physical, chart, labs and discussed the procedure including the risks, benefits and alternatives for the proposed  anesthesia with the patient or authorized representative who has indicated his/her understanding and acceptance.  ? ? ? ?Dental Advisory Given ? ?Plan Discussed with: CRNA ? ?Anesthesia Plan Comments: (Discussed risks of anesthesia with patient, including PONV, muscle aches. Rare risks discussed as well, such as cardiorespiratory sequelae, need for airway intervention and its associated risks including lip/dental/eye damage and sore throat, and allergic reactions. Discussed the role of CRNA in patient's perioperative care. Patient understands. ? ?SERIAL CONSENT OBTAINED AT PRIOR ECT)  ? ? ? ? ? ? ?Anesthesia Quick Evaluation ? ?

## 2021-10-10 NOTE — Transfer of Care (Signed)
Immediate Anesthesia Transfer of Care Note ? ?Patient: [name] ? ?Procedure(s) Performed: ECT TX ? ?Patient Location: PACU ? ?Anesthesia Type:General ? ?Level of Consciousness: drowsy ? ?Airway & Oxygen Therapy: Patient Spontanous Breathing and Patient connected to face mask oxygen ? ?Post-op Assessment: Report given to RN and Post -op Vital signs reviewed and stable ? ?Post vital signs: Reviewed and stable ? ?Last Vitals:  ?Vitals Value Taken Time  ?BP 138/102 10/10/21 1319  ?Temp    ?Pulse 88 10/10/21 1322  ?Resp 19 10/10/21 1322  ?SpO2 100 % 10/10/21 1322  ?Vitals shown include unvalidated device data. ? ?Last Pain:  ?Vitals:  ? 10/10/21 1057  ?TempSrc: Tympanic  ?   ? ?  ? ?Complications: No notable events documented. ?

## 2021-10-10 NOTE — Anesthesia Procedure Notes (Signed)
Procedure Name: General with mask airway ?Date/Time: 10/10/2021 1:14 PM ?Performed by: Hezzie Bump, CRNA ?Pre-anesthesia Checklist: Patient identified, Emergency Drugs available, Suction available and Patient being monitored ?Patient Re-evaluated:Patient Re-evaluated prior to induction ?Oxygen Delivery Method: Circle system utilized ?Preoxygenation: Pre-oxygenation with 100% oxygen ?Induction Type: IV induction ?Ventilation: Mask ventilation without difficulty and Mask ventilation throughout procedure ?Airway Equipment and Method: Bite block ?Placement Confirmation: positive ETCO2 ?Dental Injury: Teeth and Oropharynx as per pre-operative assessment  ? ? ? ? ?

## 2021-10-11 DIAGNOSIS — R569 Unspecified convulsions: Secondary | ICD-10-CM

## 2021-10-11 DIAGNOSIS — G9340 Encephalopathy, unspecified: Secondary | ICD-10-CM | POA: Diagnosis not present

## 2021-10-11 DIAGNOSIS — E43 Unspecified severe protein-calorie malnutrition: Secondary | ICD-10-CM | POA: Diagnosis not present

## 2021-10-11 DIAGNOSIS — F203 Undifferentiated schizophrenia: Secondary | ICD-10-CM | POA: Diagnosis not present

## 2021-10-11 MED ORDER — SODIUM CHLORIDE 0.9 % IV SOLN: INTRAVENOUS | Status: DC | PRN

## 2021-10-11 NOTE — Progress Notes (Signed)
Pt refusing blood sugar sticks.  Pt eventually let us get other VS.  Pt does not verbally respond to anything, but clenches arms in and keeps his eyes shut.  Notified MD.   Will continue to monitor and encourage compliance.   ?
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
## 2021-10-11 NOTE — Progress Notes (Signed)
Occupational Therapy Treatment ?Patient Details ?Name: [name] ?MRN: [id number] ?DOB: Dec 12, 1955 ?Today's Date: 10/11/2021 ? ? ?History of present illness Pt is a 66 y.o. male who presents to the ED from his ALF by EMS due to AMS after being found somnolent and not responding to questions as typical. Currently admitted for acute encephalopathy likely due to his uncontrolled primary psychiatric disorder i.e. schizophrenia. PmHx: Hypothyroid, HLD, HTN, Seizure, Agitation, Schizophrenia. ?  ?OT comments ? [name] remained minimally responsive during today's OT session but did on occasion respond to directions and engage with therapist. He consistently avoided eye contact. Performed grooming, bed mobility, repositioning for comfort. Pt non-verbal during session, however nodded yes and no to some questions, with greatly delayed response time. Continue to recommend DC to group home, with clear information provided to group home staff re: medication mgmt, importance of maintaining consistent medication schedule.  ? ?Recommendations for follow up therapy are one component of a multi-disciplinary discharge planning process, led by the attending physician.  Recommendations may be updated based on patient status, additional functional criteria and insurance authorization. ?   ?Follow Up Recommendations ? Other (comment) (group home)  ?  ?Assistance Recommended at Discharge Frequent or constant Supervision/Assistance  ?Patient can return home with the following ? A little help with walking and/or transfers;A lot of help with bathing/dressing/bathroom;Assistance with cooking/housework;Direct supervision/assist for medications management;Assist for transportation;Assistance with feeding ?  ?Equipment Recommendations ? None recommended by OT  ?  ?Recommendations for Other Services Other (comment) ? ?  ?Precautions / Restrictions Precautions ?Precautions: None ?Restrictions ?Weight Bearing Restrictions: No  ? ? ?  ? ?Mobility  Bed Mobility ?Overal bed mobility: Needs Assistance ?Bed Mobility: Rolling ?Rolling: Independent ?  ?  ?  ?  ?  ?  ? ?Transfers ?  ?  ?  ?  ?  ?  ?  ?  ?  ?General transfer comment: not attempted ?  ?  ?Balance Overall balance assessment: Needs assistance ?  ?  ?Sitting balance - Comments: not attempted ?  ?  ?  ?Standing balance comment: not attempted ?  ?  ?  ?  ?  ?  ?  ?  ?  ?  ?  ?   ? ?ADL either performed or assessed with clinical judgement  ? ?ADL Overall ADL's : Needs assistance/impaired ?  ?  ?Grooming: Therapist, nutritional;Wash/dry hands;Maximal assistance ?  ?  ?  ?  ?  ?  ?  ?  ?  ?  ?  ?  ?  ?  ?  ?  ?  ?  ? ?Extremity/Trunk Assessment Upper Extremity Assessment ?Upper Extremity Assessment: Overall WFL for tasks assessed ?  ?Lower Extremity Assessment ?Lower Extremity Assessment: Overall WFL for tasks assessed ?  ?  ?  ? ?Vision Patient Visual Report: No change from baseline ?  ?  ?Perception   ?  ?Praxis   ?  ? ?Cognition Arousal/Alertness: Lethargic ?Behavior During Therapy: Restless ?Overall Cognitive Status: No family/caregiver present to determine baseline cognitive functioning ?  ?  ?  ?  ?  ?  ?  ?  ?  ?  ?  ?  ?Following Commands: Follows one step commands consistently ?  ?  ?  ?General Comments: Pt awake, occassionally able to follow one-step directions and to respond appropriately to questions ?  ?  ?   ?Exercises Other Exercises ?Other Exercises: repositioning for comfort, bed mobility, cognitive re-orientation ? ?  ?Shoulder Instructions   ? ? ?  ?  General Comments    ? ? ?Pertinent Vitals/ Pain       Pain Assessment ?Breathing: normal ?Negative Vocalization: occasional moan/groan, low speech, negative/disapproving quality ?Facial Expression: smiling or inexpressive ?Body Language: tense, distressed pacing, fidgeting ?Consolability: no need to console ?PAINAD Score: 2 ?Pain Location: pt unable to communicate whether or not he has pain ? ?Home Living   ?  ?  ?  ?  ?  ?  ?  ?  ?  ?  ?  ?  ?  ?  ?  ?   ?  ?  ? ?  ?Prior Functioning/Environment    ?  ?  ?  ?   ? ?Frequency ? Min 2X/week  ? ? ? ? ?  ?Progress Toward Goals ? ?OT Goals(current goals can now be found in the care plan section) ? Progress towards OT goals: Progressing toward goals ? ?Acute Rehab OT Goals ?OT Goal Formulation: With patient ?Time For Goal Achievement: 10/23/21 ?Potential to Achieve Goals: Good  ?Plan Discharge plan remains appropriate   ? ?Co-evaluation ? ? ?   ?  ?  ?  ?  ? ?  ?AM-PAC OT "6 Clicks" Daily Activity     ?Outcome Measure ? ? Help from another person eating meals?: A Lot ?Help from another person taking care of personal grooming?: A Lot ?Help from another person toileting, which includes using toliet, bedpan, or urinal?: A Lot ?Help from another person bathing (including washing, rinsing, drying)?: A Lot ?Help from another person to put on and taking off regular upper body clothing?: A Lot ?Help from another person to put on and taking off regular lower body clothing?: A Lot ?6 Click Score: 12 ? ?  ?End of Session   ? ?OT Visit Diagnosis: Unsteadiness on feet (R26.81);Muscle weakness (generalized) (M62.81);Other symptoms and signs involving cognitive function ?  ?Activity Tolerance Patient limited by lethargy ?  ?Patient Left in bed;with call bell/phone within reach;with bed alarm set;with nursing/sitter in room ?  ?Nurse Communication   ?  ? ?   ? ?Time: 1501-1520 ?OT Time Calculation (min): 19 min ? ?Charges: OT General Charges ?$OT Visit: 1 Visit ?OT Treatments ?$Self Care/Home Management : 8-22 mins ?Latina Craver, PhD, MS, OTR/L ?10/11/21, 4:01 PM ? ?
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
## 2021-10-11 NOTE — Progress Notes (Signed)
PT Cancellation Note ? ?Patient Details ?Name: [name] ?MRN: [id number] ?DOB: 1956-01-15 ? ? ?Cancelled Treatment:    Reason Eval/Treat Not Completed: Other (comment). 3rd attempt this week for evaluation. Pt awake, in sidelying in bed. Pt doesn't respond to commands nor engage in conversation. Per sitter at bedside, pt is steady during ambulation back and forth to bathroom whenever he needs to void. Attempted mobility, however pt easily becomes agitated and clenches covers tightly in hands and yells out when covers removed. Consistently unable to participate in formalized therapy. Recommend to have staff encourage mobility whenever pt is agreeable as he clearly demonstrates he can ambulate spontaneously. Will sign off. ? ? ?Briawna Carver ?10/11/2021, 10:29 AM ?Elizabeth Palau, PT, DPT, GCS ?[phone] ? ?

## 2021-10-11 NOTE — TOC Progression Note (Signed)
Transition of Care (TOC) - Progression Note  ? ? ?Patient Details  ?Name: [name] ?MRN: [id number] ?Date of Birth: 11/27/1955 ? ?Transition of Care (TOC) CM/SW Contact  ?Beverly Sessions, RN ?Phone Number: ?10/11/2021, 2:02 PM ? ?Clinical Narrative:    ? ?Message below  sent to [facility] with [facility]PS and Lawanda at Group home ? ?"Kailee  ?Update on [name] ?Patient had his 3 ECT treatment yesterday ?Per Psych they feel like mentally he is at his baseline, ?He is not willing to work with PT and covers his head with a blanket ?There is a Air cabin crew at bedside and they say he is able to get up to go to the bathroom ? ?lawanda  ?Would it be possible for someone to come up and see if he is at baseline and his needs can be met? ? ?If not we can look in to LTC placement under his Medicaid at a SNF" ? ?Expected Discharge Plan: Group Home ?Barriers to Discharge: Continued Medical Work up ? ?Expected Discharge Plan and Services ?Expected Discharge Plan: Group Home ?  ?Discharge Planning Services: CM Consult ?Post Acute Care Choice: Home Health ?Living arrangements for the past 2 months: Group Home ?                ?  ?  ?  ?  ?  ?  ?  ?  ?  ?  ? ? ?Social Determinants of Health (SDOH) Interventions ?  ? ?Readmission Risk Interventions ? ?  10/11/2021  ?  8:39 AM  ?Readmission Risk Prevention Plan  ?Transportation Screening Complete  ?[place] or Home Care Consult Complete  ?Social Work Consult for [place] Planning/Counseling Complete  ?Palliative Care Screening Not Applicable  ?Medication Review Press photographer) Complete  ? ? ?

## 2021-10-11 NOTE — TOC Progression Note (Signed)
Transition of Care (TOC) - Progression Note  ? ? ?Patient Details  ?Name: Taym Twist ?MRN: [id number] ?Date of Birth: [birth year] ? ?Transition of Care (TOC) CM/SW Contact  ?Chapman Fitch, RN ?Phone Number: ?10/11/2021, 4:15 PM ? ?Clinical Narrative:    ? ?Spoke with [facility] at [facility].  She states she received an email from Bobo at [facility] on 3/29 stating to pack the patients belongs that patient would not be returning.  ? ?TOC had not received this information ?Awaiting response back from [place] ? ?Started reaching out to other Local group homes to check availability  ?- [facility] -([phone] ?- [facility] - spoke with Doristine Mango [phone] - [phone] faxed to [phone] ? ?Expected Discharge Plan: Group Home ?Barriers to Discharge: Continued Medical Work up ? ?Expected Discharge Plan and Services ?Expected Discharge Plan: Group Home ?  ?Discharge Planning Services: CM Consult ?Post Acute Care Choice: Home Health ?Living arrangements for the past 2 months: Group Home ?                ?  ?  ?  ?  ?  ?  ?  ?  ?  ?  ? ? ?Social Determinants of Health (SDOH) Interventions ?  ? ?Readmission Risk Interventions ? ?  10/11/2021  ?  8:39 AM  ?Readmission Risk Prevention Plan  ?Transportation Screening Complete  ?[place] or Home Care Consult Complete  ?Social Work Consult for [place] Care Planning/Counseling Complete  ?Palliative Care Screening Not Applicable  ?Medication Review Oceanographer) Complete  ? ? ?

## 2021-10-11 NOTE — Progress Notes (Signed)
SLP follow up Note ? ?Patient Details ?Name: [name] ?MRN: [id number] ?DOB: 1956-03-18 ? ?SLP consulted with pt's team. Current referral made to assess possibility of diet advanced.  ? ?Currently pt is consuming 100% of puree with thin liquids without overt s/s of aspiration or dysphagia. Per chart, pt continues to have fluctuations in his mentation and today he is uncooperative. In conversation with pt's RD, he is able to meet his nutritional needs with a dysphagia 1 diet. Out of an abundance of caution and in consultation with pt's team, recommend further diet advancement at next venue of care to allow pt's mentation to stabilize after transitioning.  ? ?Nyema Hachey B. Dreama Saa, M.S., CCC-SLP, CBIS ?Speech-Language Pathologist ?Rehabilitation Services ?Office [phone] ? ?Xylon Croom ?10/11/2021, 1:18 PM ?

## 2021-10-12 ENCOUNTER — Encounter: Payer: Self-pay | Admitting: Registered Nurse

## 2021-10-12 ENCOUNTER — Inpatient Hospital Stay: Admit: 2021-10-12 | Discharge: 2021-10-12 | Disposition: A | Discharge status: Home / Self Care | Payer: Medicare Other

## 2021-10-12 DIAGNOSIS — E039 Hypothyroidism, unspecified: Secondary | ICD-10-CM | POA: Diagnosis not present

## 2021-10-12 DIAGNOSIS — G9340 Encephalopathy, unspecified: Secondary | ICD-10-CM | POA: Diagnosis not present

## 2021-10-12 DIAGNOSIS — F203 Undifferentiated schizophrenia: Secondary | ICD-10-CM | POA: Diagnosis not present

## 2021-10-12 DIAGNOSIS — E43 Unspecified severe protein-calorie malnutrition: Secondary | ICD-10-CM | POA: Diagnosis not present

## 2021-10-12 LAB — GLUCOSE, CAPILLARY: Glucose-Capillary: 127 mg/dL — ABNORMAL HIGH (ref 70–99)

## 2021-10-12 MED ORDER — SUCCINYLCHOLINE CHLORIDE 200 MG/10ML IV SOSY
PREFILLED_SYRINGE | INTRAVENOUS | Status: DC | PRN
  Administered 2021-10-10: 120 mg via INTRAVENOUS

## 2021-10-12 MED ORDER — FOLIC ACID 1 MG PO TABS
1.0000 mg | ORAL_TABLET | Freq: Every day | ORAL | Status: DC
  Administered 2021-10-14 – 2021-10-22 (×3): 1 mg via ORAL
  Filled 2021-10-12 (×7): qty 1

## 2021-10-12 NOTE — Progress Notes (Signed)
Patient refused vital signs and CBG this morning, unable to administer insulin d/t refusal of CBG. Sitter remains at bedside. ?

## 2021-10-12 NOTE — Plan of Care (Signed)
?  Problem: Education: ?Goal: Knowledge of General Education information will improve ?Description: Including pain rating scale, medication(s)/side effects and non-pharmacologic comfort measures ?Outcome: Not Progressing ?  ?Problem: Health Behavior/Discharge Planning: ?Goal: Ability to manage health-related needs will improve ?Outcome: Not Progressing ?  ?Problem: Clinical Measurements: ?Goal: Ability to maintain clinical measurements within normal limits will improve ?Outcome: Progressing ?Goal: Will remain free from infection ?Outcome: Progressing ?Goal: Diagnostic test results will improve ?Outcome: Progressing ?Goal: Respiratory complications will improve ?Outcome: Progressing ?Goal: Cardiovascular complication will be avoided ?Outcome: Progressing ?  ?Problem: Activity: ?Goal: Risk for activity intolerance will decrease ?Outcome: Progressing ?  ?Problem: Nutrition: ?Goal: Adequate nutrition will be maintained ?Outcome: Progressing ?  ?Problem: Elimination: ?Goal: Will not experience complications related to bowel motility ?Outcome: Progressing ?Goal: Will not experience complications related to urinary retention ?Outcome: Progressing ?  ?Problem: Pain Managment: ?Goal: General experience of comfort will improve ?Outcome: Progressing ?  ?Problem: Safety: ?Goal: Ability to remain free from injury will improve ?Outcome: Progressing ?  ?Problem: Skin Integrity: ?Goal: Risk for impaired skin integrity will decrease ?Outcome: Progressing ?  ?Problem: Safety: ?Goal: Non-violent Restraint(s) ?Outcome: Progressing ?  ?Problem: Safety: ?Goal: Ability to remain free from injury will improve ?Outcome: Progressing ?  ?Problem: Pain Managment: ?Goal: General experience of comfort will improve ?Outcome: Progressing ?  ?Problem: Skin Integrity: ?Goal: Risk for impaired skin integrity will decrease ?Outcome: Progressing ?  ?Problem: Safety: ?Goal: Non-violent Restraint(s) ?Outcome: Progressing ?  ?

## 2021-10-12 NOTE — TOC Progression Note (Addendum)
Transition of Care (TOC) - Progression Note  ? ? ?Patient Details  ?Name: Jireh Elmore ?MRN: [id number] ?Date of Birth: November 05, 1955 ? ?Transition of Care (TOC) CM/SW Contact  ?Joe Gee E Matasha Smigelski, LCSW ?Phone Number: ?10/12/2021, 9:26 AM ? ?Clinical Narrative:    ?Level 2 PASRR started in case patient needs SNF per [facility] handoff. [facility] is closed today and over the weekend so this needs to be checked again on Monday.  ? ?Called local group home to follow up:  ?[facility] [place], left VM requesting a return call to see if he reviewed referral.  ?[facility]- Called [place] who reported Dor[facility] is now over [facility] as well.  ?[facility] Spoke to [place] who said she will pass info to their [employer] to call me back. ?[facility]- Spoke to Tammy who said she can review referral on Monday and follow up with TOC. Faxed referral to Tammy.  ? ? ?Expected Discharge Plan: Group Home ?Barriers to Discharge: Continued Medical Work up ? ?Expected Discharge Plan and Services ?Expected Discharge Plan: Group Home ?  ?Discharge Planning Services: CM Consult ?Post Acute Care Choice: Home Health ?Living arrangements for the past 2 months: Group Home ?                ?  ?  ?  ?  ?  ?  ?  ?  ?  ?  ? ? ?Social Determinants of Health (SDOH) Interventions ?  ? ?Readmission Risk Interventions ? ?  10/11/2021  ?  8:39 AM  ?Readmission Risk Prevention Plan  ?Transportation Screening Complete  ?HRI or Home Care Consult Complete  ?Social Work Consult for Recovery Care Planning/Counseling Complete  ?Palliative Care Screening Not Applicable  ?Medication Review Oceanographer) Complete  ? ? ?

## 2021-10-12 NOTE — Addendum Note (Signed)
Addendum  created 10/12/21 1115 by Hezzie Bump, CRNA  ? Intraprocedure Meds edited  ?  ?
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
## 2021-10-13 DIAGNOSIS — F203 Undifferentiated schizophrenia: Secondary | ICD-10-CM | POA: Diagnosis not present

## 2021-10-13 DIAGNOSIS — E43 Unspecified severe protein-calorie malnutrition: Secondary | ICD-10-CM | POA: Diagnosis not present
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
## 2021-10-13 NOTE — Progress Notes (Signed)
Refused to have V/S taken

## 2021-10-14 MED ORDER — ACETAMINOPHEN 325 MG PO TABS
650.0000 mg | ORAL_TABLET | Freq: Once | ORAL | Status: AC
  Administered 2021-10-14: 650 mg via ORAL
  Filled 2021-10-14: qty 2
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
## 2021-10-14 NOTE — TOC Progression Note (Signed)
Transition of Care (TOC) - Progression Note  ? ? ?Patient Details  ?Name: [name] ?MRN: [id number] ?Date of Birth: May 31, 1956 ? ?Transition of Care (TOC) CM/SW Contact  ?Jya Hughston E Larnce Schnackenberg, LCSW ?Phone Number: ?10/14/2021, 1:59 PM ? ?Clinical Narrative:   [place] [facility] stated PASRR must say SNF, not [facility]. Updated FL2 done by [facility] earlier in week. Uploaded new FL2 to [place] [facility].  ? ? ? ?Expected Discharge Plan: Group Home ?Barriers to Discharge: Continued Medical Work up ? ?Expected Discharge Plan and Services ?Expected Discharge Plan: Group Home ?  ?Discharge Planning Services: CM Consult ?Post Acute Care Choice: Home Health ?Living arrangements for the past 2 months: Group Home ?                ?  ?  ?  ?  ?  ?  ?  ?  ?  ?  ? ? ?Social Determinants of Health (SDOH) Interventions ?  ? ?Readmission Risk Interventions ? ?  10/11/2021  ?  8:39 AM  ?Readmission Risk Prevention Plan  ?Transportation Screening Complete  ?HRI or Home Care Consult Complete  ?Social Work Consult for Recovery Care Planning/Counseling Complete  ?Palliative Care Screening Not Applicable  ?Medication Review Oceanographer) Complete  ? ? ?

## 2021-10-15 DIAGNOSIS — R338 Other retention of urine: Secondary | ICD-10-CM

## 2021-10-15 DIAGNOSIS — F203 Undifferentiated schizophrenia: Secondary | ICD-10-CM | POA: Diagnosis not present

## 2021-10-15 DIAGNOSIS — G9340 Encephalopathy, unspecified: Secondary | ICD-10-CM | POA: Diagnosis not present

## 2021-10-15 DIAGNOSIS — E43 Unspecified severe protein-calorie malnutrition: Secondary | ICD-10-CM | POA: Diagnosis not present

## 2021-10-15 LAB — GLUCOSE, CAPILLARY
Glucose-Capillary: 100 mg/dL — ABNORMAL HIGH (ref 70–99)
Glucose-Capillary: 162 mg/dL — ABNORMAL HIGH (ref 70–99)
Glucose-Capillary: 124 mg/dL — ABNORMAL HIGH (ref 70–99)
Glucose-Capillary: 145 mg/dL — ABNORMAL HIGH (ref 70–99)
Glucose-Capillary: 103 mg/dL — ABNORMAL HIGH (ref 70–99)
Glucose-Capillary: 88 mg/dL (ref 70–99)
Glucose-Capillary: 133 mg/dL — ABNORMAL HIGH (ref 70–99)
Glucose-Capillary: 89 mg/dL (ref 70–99)

## 2021-10-15 LAB — TRIGLYCERIDES: Triglycerides: 91 mg/dL (ref <150)

## 2021-10-15 MED ORDER — LIDOCAINE HCL URETHRAL/MUCOSAL 2 % EX GEL
1.0000 "application " | Freq: Once | CUTANEOUS | Status: AC
  Administered 2021-10-15: 1 via URETHRAL
  Filled 2021-10-15: qty 5

## 2021-10-15 MED ORDER — HALOPERIDOL LACTATE 5 MG/ML IJ SOLN
5.0000 mg | Freq: Once | INTRAMUSCULAR | Status: AC
  Administered 2021-10-15: 5 mg via INTRAVENOUS
  Filled 2021-10-15: qty 1

## 2021-10-15 MED ORDER — POLYETHYLENE GLYCOL 3350 17 G PO PACK
17.0000 g | PACK | Freq: Every day | ORAL | Status: DC | PRN
  Administered 2021-10-15 – 2021-10-20 (×2): 17 g via ORAL
  Filled 2021-10-15 (×2): qty 1

## 2021-10-15 NOTE — Assessment & Plan Note (Signed)
Nursing noted some urinary retention on night of 4/9.  Intermittent.  Patient has been able to void today.  Likely in part due to some of his psychiatry medications when he takes them.  Monitor for now.  No evidence of infection. ?
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
## 2021-10-15 NOTE — TOC Progression Note (Signed)
Transition of Care (TOC) - Progression Note  ? ? ?Patient Details  ?Name: [name] ?MRN: [id number] ?Date of Birth: Nov 08, 1955 ? ?Transition of Care (TOC) CM/SW Contact  ?Truddie Hidden, RN ?Phone Number: ?10/15/2021, 3:16 PM ? ?Clinical Narrative:    ? ?Patient will require new PASSAR for SNF. Level 2 PASSAR started and still in review.  ? ?Expected Discharge Plan: Group Home ?Barriers to Discharge: Continued Medical Work up ? ?Expected Discharge Plan and Services ?Expected Discharge Plan: Group Home ?  ?Discharge Planning Services: CM Consult ?Post Acute Care Choice: Home Health ?Living arrangements for the past 2 months: Group Home ?                ?  ?  ?  ?  ?  ?  ?  ?  ?  ?  ? ? ?Social Determinants of Health (SDOH) Interventions ?  ? ?Readmission Risk Interventions ? ?  10/11/2021  ?  8:39 AM  ?Readmission Risk Prevention Plan  ?Transportation Screening Complete  ?HRI or Home Care Consult Complete  ?Social Work Consult for Recovery Care Planning/Counseling Complete  ?Palliative Care Screening Not Applicable  ?Medication Review Oceanographer) Complete  ? ? ?

## 2021-10-15 NOTE — Progress Notes (Signed)
?   10/15/21 0106  ?Urine Characteristics  ?Bladder Scan Volume (mL) (S)  498 mL  ? ?Bishop Limbo, NP made aware.  ?

## 2021-10-16 DIAGNOSIS — R7303 Prediabetes: Secondary | ICD-10-CM

## 2021-10-16 DIAGNOSIS — G9340 Encephalopathy, unspecified: Secondary | ICD-10-CM | POA: Diagnosis not present

## 2021-10-16 DIAGNOSIS — F203 Undifferentiated schizophrenia: Secondary | ICD-10-CM | POA: Diagnosis not present

## 2021-10-16 DIAGNOSIS — E43 Unspecified severe protein-calorie malnutrition: Secondary | ICD-10-CM | POA: Diagnosis not present

## 2021-10-16 LAB — GLUCOSE, CAPILLARY
Glucose-Capillary: 182 mg/dL — ABNORMAL HIGH (ref 70–99)
Glucose-Capillary: 141 mg/dL — ABNORMAL HIGH (ref 70–99)
Glucose-Capillary: 98 mg/dL (ref 70–99)

## 2021-10-16 LAB — HEMOGLOBIN A1C
Hgb A1c MFr Bld: 6.1 % — ABNORMAL HIGH (ref 4.8–5.6)
Mean Plasma Glucose: 128.37 mg/dL

## 2021-10-16 NOTE — Progress Notes (Signed)
Patient was not cooperative today possibly because sister visited yesterday and did not come today.  Patient refused all medications including blood sugar checks and insulin. ?
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
## 2021-10-16 NOTE — TOC Progression Note (Signed)
Transition of Care (TOC) - Progression Note  ? ? ?Patient Details  ?Name: [name] ?MRN: [id number] ?Date of Birth: 07-03-1956 ? ?Transition of Care (TOC) CM/SW Contact  ?Kerin Salen, RN ?Phone Number: ?10/16/2021, 4:18 PM ? ?Clinical Narrative: [name], [phone] manager of [facility] will come tomorrow to evaluate patient for her facility. Patient lived in this facility for over 20 years per Ms. Ray. [name] spoke with [name], DSS guardian for permission to evaluate for placement.   ? ? ? ?Expected Discharge Plan: Group Home ?Barriers to Discharge: Continued Medical Work up ? ?Expected Discharge Plan and Services ?Expected Discharge Plan: Group Home ?  ?Discharge Planning Services: CM Consult ?Post Acute Care Choice: Home Health ?Living arrangements for the past 2 months: Group Home ?                ?  ?  ?  ?  ?  ?  ?  ?  ?  ?  ? ? ?Social Determinants of Health (SDOH) Interventions ?  ? ?Readmission Risk Interventions ? ?  10/11/2021  ?  8:39 AM  ?Readmission Risk Prevention Plan  ?Transportation Screening Complete  ?[place] or Home Care Consult Complete  ?Social Work Consult for [place] Planning/Counseling Complete  ?Palliative Care Screening Not Applicable  ?Medication Review Press photographer) Complete  ? ? ?

## 2021-10-16 NOTE — Progress Notes (Signed)
Mobility Specialist - Progress Note ? ? 10/16/21 1400  ?Mobility  ?Activity Ambulated with assistance to bathroom  ?Level of Assistance Standby assist, set-up cues, supervision of patient - no hands on  ?Assistive Device None  ?Distance Ambulated (ft) 10 ft  ?Activity Response Tolerated well  ?$Mobility charge 1 Mobility  ? ? ?Mobility responded to bed alarm. Upon arrival, pt in bathroom pulling onto door to block entry. With vc pt then ambulated back to bed without physical assist, alarm set, needs in reach.  ? ? ?Filiberto Pinks ?Mobility Specialist ?10/16/21, 2:13 PM ? ? ? ? ?

## 2021-10-17 DIAGNOSIS — G9340 Encephalopathy, unspecified: Secondary | ICD-10-CM | POA: Diagnosis not present

## 2021-10-17 LAB — GLUCOSE, CAPILLARY: Glucose-Capillary: 137 mg/dL — ABNORMAL HIGH (ref 70–99)
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
## 2021-10-17 NOTE — TOC Progression Note (Signed)
Transition of Care (TOC) - Progression Note  ? ? ?Patient Details  ?Name: [name] ?MRN: [id number] ?Date of Birth: 1956/01/24 ? ?Transition of Care (TOC) CM/SW Contact  ?Truddie Hidden, RN ?Phone Number: ?10/17/2021, 2:35 PM ? ?Clinical Narrative:    ? ? ? ?Expected Discharge Plan: Group Home ?Barriers to Discharge: Continued Medical Work up ? ?Expected Discharge Plan and Services ?Expected Discharge Plan: Group Home ?  ?Discharge Planning Services: CM Consult ?Post Acute Care Choice: Home Health ?Living arrangements for the past 2 months: Group Home ?                ?  ?  ?  ?  ?  ?  ?  ?  ?  ?  ? ? ?Social Determinants of Health (SDOH) Interventions ?  ? ?Readmission Risk Interventions ? ?  10/11/2021  ?  8:39 AM  ?Readmission Risk Prevention Plan  ?Transportation Screening Complete  ?[place] or Home Care Consult Complete  ?Social Work Consult for [place] Care Planning/Counseling Complete  ?Palliative Care Screening Not Applicable  ?Medication Review Oceanographer) Complete  ? ? ?

## 2021-10-17 NOTE — Progress Notes (Signed)
Mobility Specialist - Progress Note ? ? 10/17/21 1400  ?Mobility  ?Activity Refused mobility  ? ? ? ?Pt sleeping on arrival, awakened by voice. Pt has flat affect, only states "Mateo Flow" once aroused, but is non-verbal otherwise. Unable to clarify who Mateo Flow is. When attempting to pull back covers for OOB activity, pt pulls sheets back up. Does not respond to yes/no questions with nods or gestures. Will attempt session another date/time.  ? ? ?Kathee Delton ?Mobility Specialist ?10/17/21, 2:38 PM ? ? ? ? ?

## 2021-10-17 NOTE — TOC Progression Note (Signed)
Transition of Care (TOC) - Progression Note  ? ? ?Patient Details  ?Name: [name] ?MRN: [id number] ?Date of Birth: Oct 17, 1955 ? ?Transition of Care (TOC) CM/SW Contact  ?Laurena Slimmer, RN ?Phone Number: ?10/17/2021, 3:02 PM ? ?Clinical Narrative:    ?Spoke with representative [name] from [facility]. Tommie Sams stated she would consider accepting patient back. Requested FL2 and medication. FL2 completed and emailed to [email or web address]. Information emailed.  ? ? ?Expected Discharge Plan: Group Home ?Barriers to Discharge: Continued Medical Work up ? ?Expected Discharge Plan and Services ?Expected Discharge Plan: Group Home ?  ?Discharge Planning Services: CM Consult ?Post Acute Care Choice: Home Health ?Living arrangements for the past 2 months: Group Home ?                ?  ?  ?  ?  ?  ?  ?  ?  ?  ?  ? ? ?Social Determinants of Health (SDOH) Interventions ?  ? ?Readmission Risk Interventions ? ?  10/11/2021  ?  8:39 AM  ?Readmission Risk Prevention Plan  ?Transportation Screening Complete  ?[place] or Home Care Consult Complete  ?Social Work Consult for [place] Planning/Counseling Complete  ?Palliative Care Screening Not Applicable  ?Medication Review Press photographer) Complete  ? ? ?

## 2021-10-17 NOTE — Plan of Care (Signed)
?  Problem: Education: ?Goal: Knowledge of General Education information will improve ?Description: Including pain rating scale, medication(s)/side effects and non-pharmacologic comfort measures ?Outcome: Not Progressing ?  ?Problem: Health Behavior/Discharge Planning: ?Goal: Ability to manage health-related needs will improve ?Outcome: Not Progressing ?  ?Problem: Clinical Measurements: ?Goal: Ability to maintain clinical measurements within normal limits will improve ?Outcome: Progressing ?Goal: Will remain free from infection ?Outcome: Progressing ?Goal: Diagnostic test results will improve ?Outcome: Progressing ?Goal: Respiratory complications will improve ?Outcome: Progressing ?Goal: Cardiovascular complication will be avoided ?Outcome: Progressing ?  ?Problem: Activity: ?Goal: Risk for activity intolerance will decrease ?Outcome: Progressing ?  ?Problem: Nutrition: ?Goal: Adequate nutrition will be maintained ?Outcome: Progressing ?  ?Problem: Elimination: ?Goal: Will not experience complications related to bowel motility ?Outcome: Progressing ?Goal: Will not experience complications related to urinary retention ?Outcome: Progressing ?  ?Problem: Pain Managment: ?Goal: General experience of comfort will improve ?Outcome: Progressing ?  ?Problem: Safety: ?Goal: Ability to remain free from injury will improve ?Outcome: Progressing ?  ?Problem: Skin Integrity: ?Goal: Risk for impaired skin integrity will decrease ?Outcome: Progressing ?  ?Problem: Safety: ?Goal: Non-violent Restraint(s) ?Outcome: Progressing ?  ?
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
## 2021-10-17 NOTE — Progress Notes (Signed)
Nutrition Follow Up Note  ? ?DOCUMENTATION CODES:  ? ?Severe malnutrition in context of acute illness/injury ? ?INTERVENTION:  ? ?Continue Ensure Enlive po TID, each supplement provides 350 kcal and 20 grams of protein. ? ?Continue ice cream with meal trays  ? ?MVI po daily  ? ?NUTRITION DIAGNOSIS:  ? ?Severe Malnutrition related to acute illness as evidenced by moderate fat depletion, moderate muscle depletion, severe muscle depletion,  5 percent weight loss in 1 week, energy intake < or equal to 50% for > or equal to 5 days. ? ?GOAL:  ? ?Patient will meet greater than or equal to 90% of their needs ?-met with TPN ? ?MONITOR:  ? ?PO intake, Supplement acceptance, Labs, Weight trends, Skin, I & O's ? ?ASSESSMENT:  ? ?66 year old male with history of schizophrenia, DM, hypertension, hyperlipidemia, hypothyroid, noncompliance with medications, recurrent seizures AND tardive dyskinesia who presents emergency department for chief concerns of altered mental status and aspiration PNA. ? ?Visited pt's room today. Pt unable to provide much history but only gives thumbs up and thumbs down to answer questions. Pt's oral intake has waxed and waned since admission. Pt is documented to be eating anywhere from bites to 100% of meals. Spoke with RN who reports that pt ate only bites of breakfast with juice for breakfast. Pt documented to have eaten 100% of lunch. Pt's intake of Ensure has also waxed and waned. Pt drinks 100% of Ensure at times; RD reports that pt only drank sips of his Ensure for breakfast. Pt drank 75% of his Ensure at lunch. Pt gives the thumbs up that he likes Ensure. Per chart, pt is down 35lbs since admit. Repeat NFPE today is similar to exam on admission. RD unsure if bed weights are correct. Pt currently pending placement to group home. Spoke with SLP; will have them re-evaluate this week to see if pt can be advanced past a pureed diet.  ? ?Medications reviewed and include: lovenox, folic acid, haldol,  insulin, synthroid, MVI  ? ?Labs reviewed: K 4.4 wnl, BUN 26(H), P 3.1 wnl, Mg 1.9 wnl- 4/3 ?Cbgs- 137, 98 x 48 hrs ? ?Nutrition Focused Physical Exam: ? ?Flowsheet Row Most Recent Value  ?Orbital Region Mild depletion  ?Upper Arm Region Severe depletion  ?Thoracic and Lumbar Region Moderate depletion  ?Buccal Region Mild depletion  ?Temple Region Moderate depletion  ?Clavicle Bone Region Severe depletion  ?Clavicle and Acromion Bone Region Severe depletion  ?Scapular Bone Region Moderate depletion  ?Dorsal Hand Mild depletion  ?Patellar Region Severe depletion  ?Anterior Thigh Region Severe depletion  ?Posterior Calf Region Severe depletion  ?Edema (RD Assessment) None  ?Hair Reviewed  ?Eyes Reviewed  ?Mouth Reviewed  ?Skin Reviewed  ?Nails Reviewed  ? ?Diet Order:   ?Diet Order   ? ?       ?  DIET - DYS 1 Room service appropriate? Yes; Fluid consistency: Thin  Diet effective now       ?  ? ?  ?  ? ?  ? ?EDUCATION NEEDS:  ? ?No education needs have been identified at this time ? ?Skin:  Skin Assessment: Reviewed RN Assessment ? ?Last BM:  4/2- type 2 ? ?Height:  ? ?Ht Readings from Last 1 Encounters:  ?10/06/21 _0  (1.905 m)  ? ? ?Weight:  ? ?Wt Readings from Last 1 Encounters:  ?10/17/21 81.2 kg  ? ? ?Ideal Body Weight:  89 kg ? ?BMI:  Body mass index is 22.38 kg/m?. ? ?Estimated Nutritional  Needs:  ? ?Kcal:  2400-2700kcal/day ? ?Protein:  >120g/day ? ?Fluid:  2.3-2.6L/day ? ?Koleen Distance MS, RD, LDN ?Please refer to AMION for RD and/or RD on-call/weekend/after hours pager ? ?

## 2021-10-17 NOTE — Progress Notes (Signed)
Patient refused medication this am and blood sugar check. Only drank juice for breakfast and bites. ?
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
## 2021-10-18 DIAGNOSIS — G9340 Encephalopathy, unspecified: Secondary | ICD-10-CM | POA: Diagnosis not present

## 2021-10-18 LAB — GLUCOSE, CAPILLARY
Glucose-Capillary: 169 mg/dL — ABNORMAL HIGH (ref 70–99)
Glucose-Capillary: 115 mg/dL — ABNORMAL HIGH (ref 70–99)
Glucose-Capillary: 105 mg/dL — ABNORMAL HIGH (ref 70–99)
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
## 2021-10-18 NOTE — Plan of Care (Signed)
?  Problem: Education: ?Goal: Knowledge of General Education information will improve ?Description: Including pain rating scale, medication(s)/side effects and non-pharmacologic comfort measures ?Outcome: Not Progressing ?  ?Problem: Health Behavior/Discharge Planning: ?Goal: Ability to manage health-related needs will improve ?Outcome: Not Progressing ?  ?Problem: Clinical Measurements: ?Goal: Ability to maintain clinical measurements within normal limits will improve ?Outcome: Progressing ?Goal: Will remain free from infection ?Outcome: Progressing ?Goal: Diagnostic test results will improve ?Outcome: Progressing ?Goal: Respiratory complications will improve ?Outcome: Progressing ?Goal: Cardiovascular complication will be avoided ?Outcome: Progressing ?  ?Problem: Activity: ?Goal: Risk for activity intolerance will decrease ?Outcome: Progressing ?  ?Problem: Nutrition: ?Goal: Adequate nutrition will be maintained ?Outcome: Progressing ?  ?Problem: Elimination: ?Goal: Will not experience complications related to bowel motility ?Outcome: Progressing ?Goal: Will not experience complications related to urinary retention ?Outcome: Progressing ?  ?Problem: Pain Managment: ?Goal: General experience of comfort will improve ?Outcome: Progressing ?  ?Problem: Safety: ?Goal: Ability to remain free from injury will improve ?Outcome: Progressing ?  ?Problem: Skin Integrity: ?Goal: Risk for impaired skin integrity will decrease ?Outcome: Progressing ?  ?Problem: Safety: ?Goal: Non-violent Restraint(s) ?Outcome: Progressing ?  ?

## 2021-10-18 NOTE — Progress Notes (Signed)
Patient refused 0400 vital signs.

## 2021-10-18 NOTE — TOC Progression Note (Addendum)
Transition of Care (TOC) - Progression Note  ? ? ?Patient Details  ?Name: [name] ?MRN: [id number] ?Date of Birth: 05-21-1956 ? ?Transition of Care (TOC) CM/SW Contact  ?Laurena Slimmer, RN ?Phone Number: ?10/18/2021, 1:53 PM ? ?Clinical Narrative:    ?Followed up with [name]. Stated she tried contacted DSS worker Barrie Lyme but was unsuccessful. Group home still making considerations to possible take patient back.  ? ?Attempted to contact Toula Moos at [phone]. No answer. Left message requesting return call to CM and Lawanda Ray.  ? ? ?Expected Discharge Plan: Group Home ?Barriers to Discharge: Continued Medical Work up ? ?Expected Discharge Plan and Services ?Expected Discharge Plan: Group Home ?  ?Discharge Planning Services: CM Consult ?Post Acute Care Choice: Home Health ?Living arrangements for the past 2 months: Group Home ?                ?  ?  ?  ?  ?  ?  ?  ?  ?  ?  ? ? ?Social Determinants of Health (SDOH) Interventions ?  ? ?Readmission Risk Interventions ? ?  10/11/2021  ?  8:39 AM  ?Readmission Risk Prevention Plan  ?Transportation Screening Complete  ?[place] or Home Care Consult Complete  ?Social Work Consult for [place] Planning/Counseling Complete  ?Palliative Care Screening Not Applicable  ?Medication Review Press photographer) Complete  ? ? ?

## 2021-10-19 DIAGNOSIS — G9340 Encephalopathy, unspecified: Secondary | ICD-10-CM | POA: Diagnosis not present

## 2021-10-19 DIAGNOSIS — F203 Undifferentiated schizophrenia: Secondary | ICD-10-CM | POA: Diagnosis not present

## 2021-10-19 LAB — GLUCOSE, CAPILLARY
Glucose-Capillary: 108 mg/dL — ABNORMAL HIGH (ref 70–99)
Glucose-Capillary: 125 mg/dL — ABNORMAL HIGH (ref 70–99)
Glucose-Capillary: 125 mg/dL — ABNORMAL HIGH (ref 70–99)
Glucose-Capillary: 104 mg/dL — ABNORMAL HIGH (ref 70–99)

## 2021-10-19 NOTE — TOC Progression Note (Signed)
Transition of Care (TOC) - Progression Note  ? ? ?Patient Details  ?Name: [name] ?MRN: [id number] ?Date of Birth: [birth year] ? ?Transition of Care (TOC) CM/SW Contact  ?Chapman Fitch, RN ?Phone Number: ?10/19/2021, 3:00 PM ? ?Clinical Narrative:    ?Emailed APS and Lawanda at group home ? ?"Lawanda at at patient's group home confirm they can meet the patient's needs and that they currently have a spot available for him to return. ?  ?Drucilla Schmidt - As long as they are able to meet his needs are you in agreement with this plan? ? ?Per rounds this morning the patient's blood pressure is low so he will not be discharging today ?  ?Wanting to make sure we have a confirmed spot for the patient when he is medically ready" ? ?Per Chrissy Cobb with APS ?"Thanks for your response. My supervisor Milinda Pointer stated that it was okay for Merlen to return to [facility] once he is cleared for discharge." ? ? ?Expected Discharge Plan: Group Home ?Barriers to Discharge: Continued Medical Work up ? ?Expected Discharge Plan and Services ?Expected Discharge Plan: Group Home ?  ?Discharge Planning Services: CM Consult ?Post Acute Care Choice: Home Health ?Living arrangements for the past 2 months: Group Home ?                ?  ?  ?  ?  ?  ?  ?  ?  ?  ?  ? ? ?Social Determinants of Health (SDOH) Interventions ?  ? ?Readmission Risk Interventions ? ?  10/11/2021  ?  8:39 AM  ?Readmission Risk Prevention Plan  ?Transportation Screening Complete  ?[place] or Home Care Consult Complete  ?Social Work Consult for [place] Care Planning/Counseling Complete  ?Palliative Care Screening Not Applicable  ?Medication Review Oceanographer) Complete  ? ? ?

## 2021-10-19 NOTE — Plan of Care (Signed)
  Problem: Nutrition: Goal: Adequate nutrition will be maintained Outcome: Progressing   Problem: Skin Integrity: Goal: Risk for impaired skin integrity will decrease Outcome: Progressing   

## 2021-10-19 NOTE — Progress Notes (Signed)
Final BP rechecked manually. Patient is alert and speaking. BP medications held and discontinued.  ?Will continue to monitor.  ? ?Gregory Reno, RN ? ? ? 10/19/21 0744 10/19/21 0754 10/19/21 0941  ?Vitals  ?Temp 98.8 ?F (37.1 ?C) 98 ?F (36.7 ?C)  --   ?Temp Source Oral  --   --   ?BP (!) 62/38 (!) 84/67 100/70  ?MAP (mmHg) (!) 47 74  --   ?BP Location Left Arm  --  Left Arm  ?BP Method Automatic Automatic Manual  ?Patient Position (if appropriate) Lying  --  Lying  ?Pulse Rate 70 70  --   ?Pulse Rate Source Monitor Monitor  --   ?Resp 16 16  --   ?Level of Consciousness  ?Level of Consciousness  --   --  Alert  ?MEWS COLOR  ?MEWS Score Color Yellow [name]  ?Oxygen Therapy  ?SpO2 97 % 100 %  --   ?O2 Device Room Air  --   --   ?Pain Assessment  ?Pain Scale  --   --  Faces  ?Faces Pain Scale  --   --  0  ?Glasgow Coma Scale  ?Eye Opening  --   --  4  ?Best Verbal Response (NON-intubated)  --   --  2  ?Best Motor Response  --   --  5  ?Glasgow Coma Scale Score  --   --  11  ?MEWS Score  ?MEWS Temp 0 0 0  ?MEWS Systolic 3 1 1   ?MEWS Pulse 0 0 0  ?MEWS RR 0 0 0  ?MEWS LOC 0 0 0  ?MEWS Score 3 1 1   ? ? ?

## 2021-10-19 NOTE — Progress Notes (Signed)
OT Cancellation Note ? ?Patient Details ?Name: [name] ?MRN: [id number] ?DOB: 1956/06/26 ? ? ?Cancelled Treatment:    Reason Eval/Treat Not Completed: Patient declined, no reason specified. OT attempting treatment this morning with pt keeping eyes closed. Pt even rolls in bed but does not open eyes to acknowledge therapist. OT to re-attempt at next available time.   ? ?Jackquline Denmark, MS, OTR/L , CBIS ?ascom [phone]  ?10/19/21, 11:00 AM  ?
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
## 2021-10-19 NOTE — Consult Note (Signed)
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 male patient admitted with "I guess I am okay".  HPI: Patient seen and chart reviewed.  [name] had come to the hospital unresponsive not eating and not taking medicine and was treated with electroconvulsive therapy.  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  Group home apparently refused to take him back.  On interview today I found the patient awake sitting up in bed reading his Bible.  He was pleasant in his interaction with me.  Said hello answered questions appropriately.  His chief concern was making sure that his clothing and belongings back at the group home would follow him along to the next place.  Said that he was eating fine.  No bizarre behavior  Past Psychiatric History: Long history of schizophrenia.  Low functioning baseline.  Risk to Self:   Risk to Others:   Prior Inpatient Therapy:   Prior Outpatient Therapy:    Past Medical History:  Past Medical History:  Diagnosis Date   Diabetes mellitus without complication (HCC)    HTN (hypertension)    Hyperammonemia (HCC) While on Depakote   Hyperlipemia    Lithium toxicity    Neutropenia (HCC)    Schizophrenia (HCC)    Seizure  (HCC) While toxic on Depakote   Sickle cell trait (HCC)    Thyroid disease    TIA (transient ischemic attack) in 2009   History reviewed. No pertinent surgical history. Family History: History reviewed. No pertinent family history. Family Psychiatric  History: See previous Social History:  Social History   Substance and Sexual Activity  Alcohol Use No     Social History   Substance and Sexual Activity  Drug Use Not Currently    Social History   Socioeconomic History   Marital status: Single    Spouse name: Not on file   Number of children: Not on file   Years of education: Not on file   Highest education level: Not on file  Occupational History   Not on file  Tobacco Use   Smoking status: Never   Smokeless tobacco: Never  Vaping Use   Vaping Use: Never used  Substance and Sexual Activity   Alcohol use: No   Drug use: Not Currently   Sexual activity: Not Currently  Other Topics Concern   Not on file  Social History Narrative   Not on file   Social Determinants of Health   Financial Resource Strain: Not on file  Food Insecurity: Not on file  Transportation Needs: Not on file  Physical Activity: Not on file  Stress: Not on file  Social Connections: Not on file   Additional Social History:    Allergies:   Allergies  Allergen Reactions   Depakote [Valproic Acid] Other (See Comments)  Reaction:  Hyperammonemia    Lithium Other (See Comments)    Pt has a prior history of Lithium toxicity.       Labs:  Results for orders placed or performed during the hospital encounter of 09/17/21 (from the past 48 hour(s))  Glucose, capillary     Status: Abnormal   Collection Time: 10/17/21  9:54 PM  Result Value Ref Range   Glucose-Capillary 169 (H) 70 - 99 mg/dL    Comment: Glucose reference range applies only to samples taken after fasting for at least 8 hours.  Glucose, capillary     Status: Abnormal   Collection Time: 10/18/21  8:21 AM  Result Value Ref Range    Glucose-Capillary 115 (H) 70 - 99 mg/dL    Comment: Glucose reference range applies only to samples taken after fasting for at least 8 hours.  Glucose, capillary     Status: Abnormal   Collection Time: 10/18/21 11:37 AM  Result Value Ref Range   Glucose-Capillary 105 (H) 70 - 99 mg/dL    Comment: Glucose reference range applies only to samples taken after fasting for at least 8 hours.  Glucose, capillary     Status: Abnormal   Collection Time: 10/18/21  3:59 PM  Result Value Ref Range   Glucose-Capillary 108 (H) 70 - 99 mg/dL    Comment: Glucose reference range applies only to samples taken after fasting for at least 8 hours.  Glucose, capillary     Status: Abnormal   Collection Time: 10/18/21  5:47 PM  Result Value Ref Range   Glucose-Capillary 125 (H) 70 - 99 mg/dL    Comment: Glucose reference range applies only to samples taken after fasting for at least 8 hours.  Glucose, capillary     Status: Abnormal   Collection Time: 10/18/21  8:03 PM  Result Value Ref Range   Glucose-Capillary 125 (H) 70 - 99 mg/dL    Comment: Glucose reference range applies only to samples taken after fasting for at least 8 hours.  Glucose, capillary     Status: Abnormal   Collection Time: 10/19/21  7:45 AM  Result Value Ref Range   Glucose-Capillary 104 (H) 70 - 99 mg/dL    Comment: Glucose reference range applies only to samples taken after fasting for at least 8 hours.    Current Facility-Administered Medications  Medication Dose Route Frequency Provider Last Rate Last Admin   0.9 %  sodium chloride infusion   Intravenous PRN Hollice Espy, MD   Stopped at 10/11/21 1400   Chlorhexidine Gluconate Cloth 2 % PADS 6 each  6 each Topical Daily Esaw Grandchild A, DO   6 each at 10/18/21 0833   enoxaparin (LOVENOX) injection 40 mg  40 mg Subcutaneous Q24H Cox, Amy N, DO   40 mg at 10/18/21 2101   feeding supplement (ENSURE ENLIVE / ENSURE PLUS) liquid 237 mL  237 mL Oral TID BM Lynn Ito, MD   237  mL at 10/18/21 2001   folic acid (FOLVITE) tablet 1 mg  1 mg Oral Daily Virginia Rochester K, MD   1 mg at 10/15/21 0900   haloperidol decanoate (HALDOL DECANOATE) 100 MG/ML injection 200 mg  200 mg Intramuscular Q30 days Evelyna Folker T, MD   200 mg at 10/18/21 2106   haloperidol lactate (HALDOL) injection 5 mg  5 mg Intravenous Once Redmond Whittley T, MD       insulin aspart (novoLOG) injection 0-15 Units  0-15 Units Subcutaneous TID WC Rito Ehrlich,  Linus Galas, MD   3 Units at 10/15/21 1707   insulin aspart (novoLOG) injection 0-5 Units  0-5 Units Subcutaneous QHS Hollice Espy, MD       levothyroxine (SYNTHROID) tablet 150 mcg  150 mcg Oral Q0600 Hollice Espy, MD   150 mcg at 10/19/21 0618   MEDLINE mouth rinse  15 mL Mouth Rinse BID Osvaldo Shipper, MD   15 mL at 10/18/21 2956   multivitamin with minerals tablet 1 tablet  1 tablet Oral Daily Lynn Ito, MD   1 tablet at 10/15/21 0851   OLANZapine (ZYPREXA) tablet 20 mg  20 mg Oral QHS Charm Rings, NP   20 mg at 10/18/21 2101   ondansetron (ZOFRAN) tablet 4 mg  4 mg Oral Q6H PRN Cox, Amy N, DO       Or   ondansetron (ZOFRAN) injection 4 mg  4 mg Intravenous Q6H PRN Cox, Amy N, DO       polyethylene glycol (MIRALAX / GLYCOLAX) packet 17 g  17 g Oral Daily PRN Foust, Katy L, NP   17 g at 10/15/21 0618   polyvinyl alcohol (LIQUIFILM TEARS) 1.4 % ophthalmic solution 1 drop  1 drop Right Eye PRN Manuela Schwartz, NP       sodium chloride flush (NS) 0.9 % injection 10-40 mL  10-40 mL Intracatheter Q12H Esaw Grandchild A, DO   10 mL at 10/19/21 0945   sodium chloride flush (NS) 0.9 % injection 10-40 mL  10-40 mL Intracatheter PRN Pennie Banter, DO        Musculoskeletal: Strength & Muscle Tone: within normal limits Gait & Station: normal Patient leans: N/A            Psychiatric Specialty Exam:  Presentation  General Appearance: Bizarre; Disheveled  Eye Contact:Fair  Speech:Slow; Blocked  Speech  Volume:Decreased  Handedness:Right   Mood and Affect  Mood:Irritable  Affect:Constricted   Thought Process  Thought Processes:Disorganized  Descriptions of Associations:Loose  Orientation:Partial  Thought Content:Scattered  History of Schizophrenia/Schizoaffective disorder:Yes  Duration of Psychotic Symptoms:Greater than six months  Hallucinations:No data recorded Ideas of Reference:Paranoia  Suicidal Thoughts:No data recorded Homicidal Thoughts:No data recorded  Sensorium  Memory:Recent Poor; Remote Poor  Judgment:Impaired  Insight:Poor   Executive Functions  Concentration:Poor  Attention Span:Poor  Recall:Poor  Fund of Knowledge:Poor  Language:Poor   Psychomotor Activity  Psychomotor Activity:No data recorded  Assets  Assets:Social Support; Housing   Sleep  Sleep:No data recorded  Physical Exam: Physical Exam Vitals and nursing note reviewed.  Constitutional:      Appearance: Normal appearance.  HENT:     Head: Normocephalic and atraumatic.     Mouth/Throat:     Pharynx: Oropharynx is clear.  Eyes:     Pupils: Pupils are equal, round, and reactive to light.  Cardiovascular:     Rate and Rhythm: Normal rate and regular rhythm.  Pulmonary:     Effort: Pulmonary effort is normal.     Breath sounds: Normal breath sounds.  Abdominal:     General: Abdomen is flat.     Palpations: Abdomen is soft.  Musculoskeletal:        General: Normal range of motion.  Skin:    General: Skin is warm and dry.  Neurological:     General: No focal deficit present.     Mental Status: He is alert. Mental status is at baseline.  Psychiatric:        Attention and Perception: Attention normal.  Mood and Affect: Mood normal. Affect is blunt.        Speech: Speech is delayed.        Behavior: Behavior is slowed.        Thought Content: Thought content normal.        Cognition and Memory: Cognition normal.   Review of Systems  Constitutional:  Negative.   HENT: Negative.    Eyes: Negative.   Respiratory: Negative.    Cardiovascular: Negative.   Gastrointestinal: Negative.   Musculoskeletal: Negative.   Skin: Negative.   Neurological: Negative.   Psychiatric/Behavioral: Negative.    Blood pressure 100/70, pulse 70, temperature 98 F (36.7 C), resp. rate 16, height 6\' 3"  (1.905 m), weight 81.2 kg, SpO2 100 %. Body mass index is 22.38 kg/m.  Treatment Plan Summary: Medication management and Plan I understand there had been concern that the patient was not talking or perhaps in someway was mentally regressing.  At least when I came to see him this afternoon he seemed fine.  Seemed in as good of shape as I have ever seen him.  Actually seemed to be reading the Bible and was pleasant and appropriate in conversation.  Nevertheless I know that these things change from time to time.  I will try and come back and check in over the weekend.  For now no change to medicine.  Disposition:  Recommend finding him a appropriate living situation and getting him discharged as soon as possible  Mordecai Rasmussen, MD 10/19/2021 4:34 PM

## 2021-10-20 DIAGNOSIS — G9340 Encephalopathy, unspecified: Secondary | ICD-10-CM | POA: Diagnosis not present

## 2021-10-20 MED ORDER — LACTATED RINGERS IV SOLN: INTRAVENOUS | Status: DC

## 2021-10-20 NOTE — Progress Notes (Signed)
?   10/20/21 0925 10/20/21 0931  ?Vitals  ?Temp 98 ?F (36.7 ?C)  --   ?BP (!) 70/38 96/62  ?MAP (mmHg) (!) 49 72  ?BP Location Right Arm Left Arm  ?BP Method Automatic Manual  ?Patient Position (if appropriate) Lying Lying  ?Pulse Rate 79  --   ?Pulse Rate Source Monitor  --   ?Resp 17 18  ?Level of Consciousness  ?Level of Consciousness  --  Alert  ?MEWS COLOR  ?MEWS Score Color Yellow Green  ?Oxygen Therapy  ?SpO2 99 % 100 %  ?O2 Device  --  Room Air  ?Pain Assessment  ?Pain Scale  --  0-10  ?Pain Score  --  0  ?MEWS Score  ?MEWS Temp 0 0  ?MEWS Systolic 2 1  ?MEWS Pulse 0 0  ?MEWS RR 0 0  ?MEWS LOC 0 0  ?MEWS Score 2 1  ? ?BP reported to Dr. Kurtis Bushman. Patient is alert and responsive.  ? ?Fuller Mandril, RN ? ?

## 2021-10-20 NOTE — Progress Notes (Signed)
Patient refused vitals, glucose checks and medications. Nurse asked patient why he would not take he would not allow medical care and patient stated "i'm not sick". ?

## 2021-10-20 NOTE — Progress Notes (Signed)
No head to toe assessment completed as patient refused when nurse tried to approach him covering head with blanket. ?

## 2021-10-20 NOTE — Plan of Care (Signed)
?  Problem: Safety: ?Goal: Non-violent Restraint(s) ?Outcome: Progressing ?  ?Problem: Education: ?Goal: Knowledge of General Education information will improve ?Description: Including pain rating scale, medication(s)/side effects and non-pharmacologic comfort measures ?Outcome: Not Progressing ?  ?Problem: Health Behavior/Discharge Planning: ?Goal: Ability to manage health-related needs will improve ?Outcome: Not Progressing ?  ?Problem: Nutrition: ?Goal: Adequate nutrition will be maintained ?Outcome: Not Progressing ?  ?Problem: Elimination: ?Goal: Will not experience complications related to bowel motility ?Outcome: Not Progressing ?  ?

## 2021-10-20 NOTE — Plan of Care (Signed)
?  Problem: Education: ?Goal: Knowledge of General Education information will improve ?Description: Including pain rating scale, medication(s)/side effects and non-pharmacologic comfort measures ?Outcome: Not Progressing ?  ?Problem: Health Behavior/Discharge Planning: ?Goal: Ability to manage health-related needs will improve ?Outcome: Not Progressing ?  ?Problem: Clinical Measurements: ?Goal: Ability to maintain clinical measurements within normal limits will improve ?Outcome: Progressing ?Goal: Will remain free from infection ?Outcome: Progressing ?Goal: Diagnostic test results will improve ?Outcome: Progressing ?Goal: Respiratory complications will improve ?Outcome: Progressing ?Goal: Cardiovascular complication will be avoided ?Outcome: Progressing ?  ?Problem: Activity: ?Goal: Risk for activity intolerance will decrease ?Outcome: Progressing ?  ?Problem: Nutrition: ?Goal: Adequate nutrition will be maintained ?Outcome: Progressing ?  ?Problem: Elimination: ?Goal: Will not experience complications related to bowel motility ?Outcome: Progressing ?Goal: Will not experience complications related to urinary retention ?Outcome: Progressing ?  ?Problem: Pain Managment: ?Goal: General experience of comfort will improve ?Outcome: Progressing ?  ?Problem: Safety: ?Goal: Ability to remain free from injury will improve ?Outcome: Progressing ?  ?Problem: Skin Integrity: ?Goal: Risk for impaired skin integrity will decrease ?Outcome: Progressing ?  ?Problem: Safety: ?Goal: Non-violent Restraint(s) ?Outcome: Progressing ?  ?

## 2021-10-20 NOTE — Progress Notes (Signed)
Patient refused RN to administer oral and IV medications. Per patient, "I am not sick." RN tried to explain to patient the reason for nutritional supplements and IV fluids. Patient continued to refuse. Patient also refused Lab tech to draw blood this morning. Updated Dr. Marylu Lund on patient's status.  ? ?Madie Reno, RN ? ?
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
## 2021-10-21 DIAGNOSIS — G9340 Encephalopathy, unspecified: Secondary | ICD-10-CM | POA: Diagnosis not present

## 2021-10-21 NOTE — Progress Notes (Signed)
Patient refused all medications today. MD made aware. ?
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
## 2021-10-22 DIAGNOSIS — G9340 Encephalopathy, unspecified: Secondary | ICD-10-CM | POA: Diagnosis not present

## 2021-10-22 LAB — CBC
HCT: 32.5 % — ABNORMAL LOW (ref 39.0–52.0)
Hemoglobin: 10.6 g/dL — ABNORMAL LOW (ref 13.0–17.0)
MCH: 27.5 pg (ref 26.0–34.0)
MCHC: 32.6 g/dL (ref 30.0–36.0)
MCV: 84.2 fL (ref 80.0–100.0)
Platelets: 156 10*3/uL (ref 150–400)
RBC: 3.86 MIL/uL — ABNORMAL LOW (ref 4.22–5.81)
RDW: 14.5 % (ref 11.5–15.5)
WBC: 3 10*3/uL — ABNORMAL LOW (ref 4.0–10.5)
nRBC: 0 % (ref 0.0–0.2)

## 2021-10-22 LAB — BASIC METABOLIC PANEL
Anion gap: 4 — ABNORMAL LOW (ref 5–15)
BUN: 22 mg/dL (ref 8–23)
CO2: 28 mmol/L (ref 22–32)
Calcium: 8 mg/dL — ABNORMAL LOW (ref 8.9–10.3)
Chloride: 108 mmol/L (ref 98–111)
Creatinine, Ser: 0.92 mg/dL (ref 0.61–1.24)
GFR, Estimated: >60 mL/min (ref >60)
Glucose, Bld: 97 mg/dL (ref 70–99)
Potassium: 3.7 mmol/L (ref 3.5–5.1)
Sodium: 140 mmol/L (ref 135–145)

## 2021-10-22 LAB — GLUCOSE, CAPILLARY
Glucose-Capillary: 119 mg/dL — ABNORMAL HIGH (ref 70–99)
Glucose-Capillary: 234 mg/dL — ABNORMAL HIGH (ref 70–99)
Glucose-Capillary: 123 mg/dL — ABNORMAL HIGH (ref 70–99)
Glucose-Capillary: 137 mg/dL — ABNORMAL HIGH (ref 70–99)
Glucose-Capillary: 91 mg/dL (ref 70–99)
Glucose-Capillary: 100 mg/dL — ABNORMAL HIGH (ref 70–99)

## 2021-10-22 LAB — TRIGLYCERIDES: Triglycerides: 45 mg/dL (ref <150)

## 2021-10-22 MED ORDER — POLYVINYL ALCOHOL 1.4 % OP SOLN: 1.0000 [drp] | OPHTHALMIC | 0 refills | Status: AC | PRN

## 2021-10-22 MED ORDER — LEVOTHYROXINE SODIUM 150 MCG PO TABS
150.0000 ug | ORAL_TABLET | Freq: Every day | ORAL | 0 refills | Status: AC
Start: 2021-10-23 — End: 2022-04-15

## 2021-10-22 MED ORDER — ENSURE ENLIVE PO LIQD: 237.0000 mL | Freq: Three times a day (TID) | ORAL | 0 refills | Status: AC

## 2021-10-22 MED ORDER — ADULT MULTIVITAMIN W/MINERALS CH: 1.0000 | ORAL_TABLET | Freq: Every day | ORAL | 0 refills | Status: AC

## 2021-10-22 MED ORDER — HALOPERIDOL DECANOATE 100 MG/ML IM SOLN: 200.0000 mg | INTRAMUSCULAR | 0 refills | Status: AC

## 2021-10-22 MED ORDER — OLANZAPINE 20 MG PO TABS: 20.0000 mg | ORAL_TABLET | Freq: Every day | ORAL | 0 refills | Status: AC

## 2021-10-22 NOTE — TOC Transition Note (Signed)
Transition of Care (TOC) - CM/SW Discharge Note ? ? ?Patient Details  ?Name: [name] ?MRN: [id number] ?Date of Birth: Jun 01, 1956 ? ?Transition of Care (TOC) CM/SW Contact:  ?Chapman Fitch, RN ?Phone Number: ?10/22/2021, 2:31 PM ? ? ?Clinical Narrative:    ? ? ?Patient to discharge back to group home today.  Confirmed with lawanda that they will be able to administer patient's monthly Haldol. ? ?Lawanda to arranged transport for discharge ?Fl2 and DC summary secure emailed to Alphonsa Gin, [place] with [facility], as well at [facility].  ? ?Barbara Cower with [facility] notified of discharge.  Change of clothes provided for patient at discharge  ?  ?Barriers to Discharge: Continued Medical Work up ? ? ?Patient Goals and CMS Choice ?  ?CMS Medicare.gov Compare Post Acute Care list provided to:: Patient Represenative (must comment) ?Choice offered to / list presented to : [facility] POA / Guardian ? ?Discharge Placement ?  ?           ?  ?  ?  ?  ? ?Discharge Plan and Services ?  ?Discharge Planning Services: CM Consult ?Post Acute Care Choice: Home Health          ?  ?  ?  ?  ?  ?  ?  ?  ?  ?  ? ?Social Determinants of Health (SDOH) Interventions ?  ? ? ?Readmission Risk Interventions ? ?  10/11/2021  ?  8:39 AM  ?Readmission Risk Prevention Plan  ?Transportation Screening Complete  ?HRI or Home Care Consult Complete  ?Social Work Consult for Recovery Care Planning/Counseling Complete  ?Palliative Care Screening Not Applicable  ?Medication Review Oceanographer) Complete  ? ? ? ? ? ?

## 2021-10-22 NOTE — Progress Notes (Signed)
OT Cancellation Note ? ?Patient Details ?Name: [name] ?MRN: [id number] ?DOB: 08/10/1955 ? ? ?Cancelled Treatment:    Reason Eval/Treat Not Completed: Fatigue/lethargy limiting ability to participate. Pt laying on his R side, does not wake to verbal or tactile cues. Will re-attempt at later date/time as pt is more alert and able to participate.  ? ?Arman Filter., MPH, MS, OTR/L ?ascom [phone] ?10/22/21, 10:33 AM ? ?
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
## 2021-10-22 NOTE — TOC Progression Note (Signed)
Transition of Care (TOC) - Progression Note  ? ? ?Patient Details  ?Name: [name] ?MRN: [id number] ?Date of Birth: August 02, 1955 ? ?Transition of Care (TOC) CM/SW Contact  ?Truddie Hidden, RN ?Phone Number: ?10/22/2021, 11:54 AM ? ?Clinical Narrative:    ?Left message for Jimmye Norman advising patient is ready for discharge.  ? ? ?Expected Discharge Plan: Group Home ?Barriers to Discharge: Continued Medical Work up ? ?Expected Discharge Plan and Services ?Expected Discharge Plan: Group Home ?  ?Discharge Planning Services: CM Consult ?Post Acute Care Choice: Home Health ?Living arrangements for the past 2 months: Group Home ?                ?  ?  ?  ?  ?  ?  ?  ?  ?  ?  ? ? ?Social Determinants of Health (SDOH) Interventions ?  ? ?Readmission Risk Interventions ? ?  10/11/2021  ?  8:39 AM  ?Readmission Risk Prevention Plan  ?Transportation Screening Complete  ?HRI or Home Care Consult Complete  ?Social Work Consult for Recovery Care Planning/Counseling Complete  ?Palliative Care Screening Not Applicable  ?Medication Review Oceanographer) Complete  ? ? ?
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
## 2022-04-15 ENCOUNTER — Other Ambulatory Visit: Payer: Self-pay

## 2022-04-15 ENCOUNTER — Encounter: Payer: Self-pay | Admitting: Emergency Medicine

## 2022-04-15 ENCOUNTER — Emergency Department: Payer: Medicare Other

## 2022-04-15 ENCOUNTER — Emergency Department
Admission: EM | Admit: 2022-04-15 | Discharge: 2022-04-15 | Disposition: A | Discharge status: Home / Self Care | Payer: Medicare Other | Attending: Emergency Medicine | Admitting: Emergency Medicine

## 2022-04-15 DIAGNOSIS — R4182 Altered mental status, unspecified: Secondary | ICD-10-CM | POA: Insufficient documentation

## 2022-04-15 DIAGNOSIS — Y900 Blood alcohol level of less than 20 mg/100 ml: Secondary | ICD-10-CM | POA: Diagnosis not present

## 2022-04-15 DIAGNOSIS — Z1152 Encounter for screening for COVID-19: Secondary | ICD-10-CM | POA: Insufficient documentation

## 2022-04-15 LAB — COMPREHENSIVE METABOLIC PANEL
ALT: 12 U/L (ref 0–44)
AST: 20 U/L (ref 15–41)
Albumin: 3.8 g/dL (ref 3.5–5.0)
Alkaline Phosphatase: 56 U/L (ref 38–126)
Anion gap: 11 (ref 5–15)
BUN: 16 mg/dL (ref 8–23)
CO2: 24 mmol/L (ref 22–32)
Calcium: 8.6 mg/dL — ABNORMAL LOW (ref 8.9–10.3)
Chloride: 104 mmol/L (ref 98–111)
Creatinine, Ser: 1.06 mg/dL (ref 0.61–1.24)
GFR, Estimated: >60 mL/min (ref >60)
Glucose, Bld: 204 mg/dL — ABNORMAL HIGH (ref 70–99)
Potassium: 3.8 mmol/L (ref 3.5–5.1)
Sodium: 139 mmol/L (ref 135–145)
Total Bilirubin: 0.8 mg/dL (ref 0.3–1.2)
Total Protein: 7.3 g/dL (ref 6.5–8.1)

## 2022-04-15 LAB — CBC WITH DIFFERENTIAL/PLATELET
Abs Immature Granulocytes: 0.03 10*3/uL (ref 0.00–0.07)
Basophils Absolute: 0 10*3/uL (ref 0.0–0.1)
Basophils Relative: 0 %
Eosinophils Absolute: 0.1 10*3/uL (ref 0.0–0.5)
Eosinophils Relative: 2 %
HCT: 38.5 % — ABNORMAL LOW (ref 39.0–52.0)
Hemoglobin: 12.3 g/dL — ABNORMAL LOW (ref 13.0–17.0)
Immature Granulocytes: 1 %
Lymphocytes Relative: 10 %
Lymphs Abs: 0.5 10*3/uL — ABNORMAL LOW (ref 0.7–4.0)
MCH: 25.3 pg — ABNORMAL LOW (ref 26.0–34.0)
MCHC: 31.9 g/dL (ref 30.0–36.0)
MCV: 79.2 fL — ABNORMAL LOW (ref 80.0–100.0)
Monocytes Absolute: 0.3 10*3/uL (ref 0.1–1.0)
Monocytes Relative: 7 %
Neutro Abs: 3.8 10*3/uL (ref 1.7–7.7)
Neutrophils Relative %: 80 %
Platelets: 245 10*3/uL (ref 150–400)
RBC: 4.86 MIL/uL (ref 4.22–5.81)
RDW: 13.8 % (ref 11.5–15.5)
WBC: 4.7 10*3/uL (ref 4.0–10.5)
nRBC: 0 % (ref 0.0–0.2)

## 2022-04-15 LAB — TROPONIN I (HIGH SENSITIVITY)
Troponin I (High Sensitivity): 5 ng/L (ref <18)
Troponin I (High Sensitivity): 5 ng/L (ref <18)

## 2022-04-15 LAB — RESP PANEL BY RT-PCR (FLU A&B, COVID) ARPGX2
Influenza A by PCR: NEGATIVE
Influenza B by PCR: NEGATIVE
SARS Coronavirus 2 by RT PCR: NEGATIVE

## 2022-04-15 LAB — LACTIC ACID, PLASMA
Lactic Acid, Venous: 2.2 mmol/L (ref 0.5–1.9)
Lactic Acid, Venous: 1.5 mmol/L (ref 0.5–1.9)

## 2022-04-15 LAB — ETHANOL: Alcohol, Ethyl (B): <10 mg/dL (ref <10)

## 2022-04-15 MED ORDER — LEVETIRACETAM IN NACL 1000 MG/100ML IV SOLN
1000.0000 mg | Freq: Once | INTRAVENOUS | Status: AC
  Administered 2022-04-15: 1000 mg via INTRAVENOUS
  Filled 2022-04-15: qty 100

## 2022-04-15 MED ORDER — SODIUM CHLORIDE 0.9 % IV BOLUS
1000.0000 mL | Freq: Once | INTRAVENOUS | Status: AC
  Administered 2022-04-15: 1000 mL via INTRAVENOUS

## 2022-04-15 NOTE — ED Triage Notes (Signed)
Pt via EMS from [facility]. Pt was found on the floor this AM by staff, pt was helped into the living and sat on the couch, pt then suddenly had a blank stare and was not responding. EMS gave pt 2.5 Versed and 557mL of Fluid. Pt is responsive to voice on arrival.

## 2022-04-15 NOTE — ED Provider Notes (Signed)
[facility] Provider Note   Event Date/Time   First MD Initiated Contact with Patient 04/15/22 1002     (approximate) History  Altered Mental Status  HPI [name] is a 66 y.o. male who presents from a group home after being found down and minimally responsive.  When EMS arrived patient was interactive but would not answer questions verbally.  They noted patient to be somewhat somnolent and show the patient does have history of of seizures but does not know whether he is on antiepileptic medication.  Further history and review of systems are unable to be obtained at this time given patient's GCS of 11.    Physical Exam  Triage Vital Signs: ED Triage Vitals  Enc Vitals Group     BP      Pulse      Resp      Temp      Temp src      SpO2      Weight      Height      Head Circumference      Peak Flow      Pain Score      Pain Loc      Pain Edu?      Excl. in [place]?    Most recent vital signs: Vitals:   04/15/22 1400 04/15/22 1459  BP: (!) 173/103 (!) 168/98  Pulse: (!) 108 99  Resp: (!) 21 18  Temp:  98 F (36.7 C)  SpO2: 95% 95%   General: Asleep but easily arousable to voice and not answering questions CV:  Good peripheral perfusion.  Resp:  Normal effort.  Abd:  No distention.  Other:  Elderly African-American male laying in bed GCS 11 ED Results / Procedures / Treatments  Labs (all labs ordered are listed, but only abnormal results are displayed) Labs Reviewed  COMPREHENSIVE METABOLIC PANEL - Abnormal; Notable for the following components:      Result Value   Glucose, Bld 204 (*)    Calcium 8.6 (*)    All other components within normal limits  LACTIC ACID, PLASMA - Abnormal; Notable for the following components:   Lactic Acid, Venous 2.2 (*)    All other components within normal limits  CBC WITH DIFFERENTIAL/PLATELET - Abnormal; Notable for the following components:   Hemoglobin 12.3 (*)    HCT 38.5 (*)    MCV 79.2 (*)    MCH  25.3 (*)    Lymphs Abs 0.5 (*)    All other components within normal limits  RESP PANEL BY RT-PCR (FLU A&B, COVID) ARPGX2  ETHANOL  LACTIC ACID, PLASMA  URINALYSIS, ROUTINE W REFLEX MICROSCOPIC  URINE DRUG SCREEN, QUALITATIVE ([facility] ONLY)  TROPONIN I (HIGH SENSITIVITY)  TROPONIN I (HIGH SENSITIVITY)   EKG ED ECG REPORT I, Naaman Plummer, the attending physician, personally viewed and interpreted this ECG. Date: 04/15/2022 EKG Time: 0953 Rate: 95 Rhythm: normal sinus rhythm QRS Axis: normal Intervals: Left bundle branch block ST/T Wave abnormalities: normal Narrative Interpretation: Normal sinus rhythm with left bundle branch block.  No evidence of acute ischemia RADIOLOGY ED MD interpretation: CT of the head without contrast interpreted by me shows no evidence of acute abnormalities including no intracerebral hemorrhage, obvious masses, or significant edema  One-view portable chest x-ray interpreted by me shows no evidence of acute abnormalities including no pneumonia, pneumothorax, or widened mediastinum -Agree with radiology assessment Official radiology report(s): CT Head Wo Contrast  Result Date: 04/15/2022 CLINICAL  DATA:  Mental status change, unknown cause. Found down on floor this morning by staff. EXAM: CT HEAD WITHOUT CONTRAST TECHNIQUE: Contiguous axial images were obtained from the base of the skull through the vertex without intravenous contrast. RADIATION DOSE REDUCTION: This exam was performed according to the departmental dose-optimization program which includes automated exposure control, adjustment of the mA and/or kV according to patient size and/or use of iterative reconstruction technique. COMPARISON:  09/22/2021 head CT. FINDINGS: Brain: No evidence of parenchymal hemorrhage or extra-axial fluid collection. No mass lesion, mass effect, or midline shift. No CT evidence of acute infarction. Nonspecific mild subcortical and periventricular Jeanmarie matter hypodensity, most  in keeping with chronic small vessel ischemic change. Cerebral volume is age appropriate. No ventriculomegaly. Vascular: No acute abnormality. Skull: No evidence of calvarial fracture. Sinuses/Orbits: The visualized paranasal sinuses are essentially clear. Other:  The mastoid air cells are unopacified. IMPRESSION: 1. No evidence of acute intracranial abnormality. No evidence of calvarial fracture. 2. Mild chronic small vessel ischemic changes in the cerebral Saulnier matter. Electronically Signed   By: Ilona Sorrel M.D.   On: 04/15/2022 10:38   DG Chest 1 View  Result Date: 04/15/2022 CLINICAL DATA:  Altered mental status EXAM: CHEST  1 VIEW COMPARISON:  Chest radiograph September 22, 2021 FINDINGS: Monitoring leads overlie the patient. Stable cardiac and mediastinal contours. Elevation left hemidiaphragm. No large area pulmonary consolidation. No pleural effusion or pneumothorax. Thoracic spine degenerative changes. IMPRESSION: No active disease. Electronically Signed   By: Lovey Newcomer M.D.   On: 04/15/2022 10:29   PROCEDURES: Critical Care performed: No Procedures MEDICATIONS ORDERED IN ED: Medications  levETIRAcetam (KEPPRA) IVPB 1000 mg/100 mL premix (0 mg Intravenous Stopped 04/15/22 1111)  levETIRAcetam (KEPPRA) IVPB 1000 mg/100 mL premix (0 mg Intravenous Stopped 04/15/22 1111)  sodium chloride 0.9 % bolus 1,000 mL (0 mLs Intravenous Stopped 04/15/22 1111)   IMPRESSION / MDM / ASSESSMENT AND PLAN / ED COURSE  I reviewed the triage vital signs and the nursing notes.                             The patient is on the cardiac monitor to evaluate for evidence of arrhythmia and/or significant heart rate changes. Patient's presentation is most consistent with acute presentation with potential threat to life or bodily function. Patient presents after recent seizure episode.  Patient had slow return to baseline mental and physical function per myself and hospital staff. No immunosuppresion hx and had no  preceding fever. No history of alcohol abuse or suspicion for toxin ingestion. Unlikely stroke, syncope. Unlikely infectious etiology. No preceding trauma.  Workup: EKG, BMP, POCT glucose (pregnancy test if male) and CT Brain.  Field Interventions: None ED Interventions: 2 g Keppra IV  Prior to discharge, patient returned to baseline and states that he has never been on any medications for his seizures but feels that he probably needs to.  Will provide patient with neurology follow-up Disposition: Discharge home with primary care follow up in next 24-48 hours.   FINAL CLINICAL IMPRESSION(S) / ED DIAGNOSES   Final diagnoses:  Altered mental status, unspecified altered mental status type   Rx / DC Orders   ED Discharge Orders     None      Note:  This document was prepared using Dragon voice recognition software and may include unintentional dictation errors.   Naaman Plummer, MD 04/15/22 [phone]

## 2022-06-04 ENCOUNTER — Encounter: Payer: Self-pay | Admitting: Emergency Medicine

## 2022-06-04 ENCOUNTER — Emergency Department
Admission: EM | Admit: 2022-06-04 | Discharge: 2022-06-04 | Disposition: A | Discharge status: Home / Self Care | Payer: Medicare Other | Attending: Emergency Medicine | Admitting: Emergency Medicine

## 2022-06-04 ENCOUNTER — Other Ambulatory Visit: Payer: Self-pay

## 2022-06-04 ENCOUNTER — Emergency Department: Payer: Medicare Other

## 2022-06-04 DIAGNOSIS — Z79899 Other long term (current) drug therapy: Secondary | ICD-10-CM | POA: Insufficient documentation

## 2022-06-04 DIAGNOSIS — M79602 Pain in left arm: Secondary | ICD-10-CM

## 2022-06-04 DIAGNOSIS — F209 Schizophrenia, unspecified: Secondary | ICD-10-CM | POA: Insufficient documentation

## 2022-06-04 DIAGNOSIS — I10 Essential (primary) hypertension: Secondary | ICD-10-CM | POA: Diagnosis not present

## 2022-06-04 LAB — SALICYLATE LEVEL: Salicylate Lvl: <7 mg/dL — ABNORMAL LOW (ref 7.0–30.0)

## 2022-06-04 LAB — COMPREHENSIVE METABOLIC PANEL
ALT: 11 U/L (ref 0–44)
AST: 16 U/L (ref 15–41)
Albumin: 3.8 g/dL (ref 3.5–5.0)
Alkaline Phosphatase: 81 U/L (ref 38–126)
Anion gap: 7 (ref 5–15)
BUN: 20 mg/dL (ref 8–23)
CO2: 26 mmol/L (ref 22–32)
Calcium: 9.1 mg/dL (ref 8.9–10.3)
Chloride: 107 mmol/L (ref 98–111)
Creatinine, Ser: 0.91 mg/dL (ref 0.61–1.24)
GFR, Estimated: >60 mL/min (ref >60)
Glucose, Bld: 104 mg/dL — ABNORMAL HIGH (ref 70–99)
Potassium: 3.8 mmol/L (ref 3.5–5.1)
Sodium: 140 mmol/L (ref 135–145)
Total Bilirubin: 0.4 mg/dL (ref 0.3–1.2)
Total Protein: 7.1 g/dL (ref 6.5–8.1)

## 2022-06-04 LAB — CBC
HCT: 37.6 % — ABNORMAL LOW (ref 39.0–52.0)
Hemoglobin: 12 g/dL — ABNORMAL LOW (ref 13.0–17.0)
MCH: 25.3 pg — ABNORMAL LOW (ref 26.0–34.0)
MCHC: 31.9 g/dL (ref 30.0–36.0)
MCV: 79.3 fL — ABNORMAL LOW (ref 80.0–100.0)
Platelets: 262 10*3/uL (ref 150–400)
RBC: 4.74 MIL/uL (ref 4.22–5.81)
RDW: 14.6 % (ref 11.5–15.5)
WBC: 5.2 10*3/uL (ref 4.0–10.5)
nRBC: 0 % (ref 0.0–0.2)

## 2022-06-04 LAB — LITHIUM LEVEL: Lithium Lvl: <0.06 mmol/L — ABNORMAL LOW (ref 0.60–1.20)

## 2022-06-04 LAB — ACETAMINOPHEN LEVEL: Acetaminophen (Tylenol), Serum: <10 ug/mL — ABNORMAL LOW (ref 10–30)

## 2022-06-04 LAB — ETHANOL: Alcohol, Ethyl (B): <10 mg/dL (ref <10)

## 2022-06-04 NOTE — ED Notes (Signed)
Attempted to Contact Brandi with no answer, VM left for call back

## 2022-06-04 NOTE — ED Triage Notes (Signed)
Present via EMS from [facility] with left arm pain  denies any injury  pain increases with movement

## 2022-06-04 NOTE — ED Notes (Signed)
[name] picked up pt and is returning him to group home. Brandi called to alert care staff.

## 2022-06-04 NOTE — ED Provider Notes (Signed)
[facility] Provider Note    Event Date/Time   First MD Initiated Contact with Patient 06/04/22 1221     (approximate)  History   Chief Complaint: Arm Injury  HPI  Philippe Gang is a 66 y.o. male with a past medical history of hypertension, hyperlipidemia, schizophrenia, presents to the emergency department for evaluation.  The triage note states the patient is coming for left arm pain, per triage nurse patient denied any injury.  During my evaluation patient appears disheveled sitting on the bed patient is writing in a viable he has multiple notebooks of scribbled words that appear to be mostly religious.  Patient is just moaning lightly to himself unwilling to answer any of my questions or follow any commands just continues to jot in his Bible.  Patient has a history of schizophrenia.  As far as the arm pain complaint patient has full range of motion in both of his arms.  He is not following commands but allows me to move them passively no apparent discomfort no deformity.  Neuro vastly intact distally.  Unable to provide any history whatsoever.   Physical Exam   Triage Vital Signs: ED Triage Vitals  Enc Vitals Group     BP 06/04/22 1116 (!) 130/96     Pulse Rate 06/04/22 1116 (!) 107     Resp 06/04/22 1116 18     Temp 06/04/22 1116 98.2 F (36.8 C)     Temp Source 06/04/22 1116 Oral     SpO2 06/04/22 1116 97 %     Weight 06/04/22 1037 251 lb 5.2 oz (114 kg)     Height 06/04/22 1037 6\' 4"  (1.93 m)     Head Circumference --      Peak Flow --      Pain Score --      Pain Loc --      Pain Edu? --      Excl. in [place]? --     Most recent vital signs: Vitals:   06/04/22 1116  BP: (!) 130/96  Pulse: (!) 107  Resp: 18  Temp: 98.2 F (36.8 C)  SpO2: 97%    General: Awake, no distress.  CV:  Good peripheral perfusion.  Regular rate and rhythm  Resp:  Normal effort.  Equal breath sounds bilaterally.  Abd:  No distention.  Soft, nontender.  No rebound  or guarding.  ED Results / Procedures / Treatments   MEDICATIONS ORDERED IN ED: Medications - No data to display   IMPRESSION / MDM / ASSESSMENT AND PLAN / ED COURSE  I reviewed the triage vital signs and the nursing notes.  Patient's presentation is most consistent with acute presentation with potential threat to life or bodily function.  Patient presents emergency department initially was complaining of arm pain per triage nurse.  Here the patient does not appear to have any discomfort on my examination however patient refuses to answer any questions or follow any commands.  Patient is sitting on the bed disheveled in appearance writing somewhat frantically in his Bible.  Patient has several notebooks of scribbled handwriting on the bed from what I can read most of which contain religious writings.  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  We will check labs including a lithium level.  Given the patient's presentation I believe he would benefit from psychiatric evaluation.  Do not believe he is a active danger to himself or  anyone else will not place him under IVC at this time but will need psychiatric evaluation.  FINAL CLINICAL IMPRESSION(S) / ED DIAGNOSES   Schizophrenia   Note:  This document was prepared using Dragon voice recognition software and may include unintentional dictation errors.   Minna Antis, MD 06/04/22 1234

## 2022-06-04 NOTE — ED Provider Notes (Signed)
-----------------------------------------   4:29 PM on 06/04/2022 ----------------------------------------- Patient seen and cleared by psychiatry, found to be at his baseline mental status following further conversation with his group home.  Group home did report concern that he was not moving his left arm earlier today, however he has been noted to move his left arm without difficulty here in the ED with no pain noted.  On reassessment, he has no tenderness whatsoever throughout his left arm, strong radial pulse noted.  He is appropriate for discharge back to his group home with PCP follow-up.   Chesley Noon, MD 06/04/22 [phone]

## 2022-06-04 NOTE — Consult Note (Signed)
Psychiatry consult was put in by Dr. Lenard Lance for unknown reason. Patient has history of schizophrenia, but came into the ED for arm pain. Patient was calm on approach. He is known to this Clinical research associate. Patient refused to answer any questions, just lay there and looked at me and Renee, [name] therapist. Patient appears to be at  baseline. He is not acting out in any way. When asked about his arm pain, he did not answer.   Writer advised the oncoming ED provider (Dr. Larinda Buttery) that there does not appear to be any psych reason for patient to be here, but I would contact his group home.   Group home owner, Jimmye Norman was contacted. She and her daughter, who is a Engineer, civil (consulting) and works there, says that staff alerted them that patient had complained of left arm pain last night and would not move his left arm for the nurse today and she felt that he had diminished left hand strength so he was brought to the ED. She states there is no history of fall or any other injury that they are aware of. She said he has been doing well on his mental health meds; he was complaining a few months ago about getting his injections of Haldol so his outpatient provider changed him to oral meds only and he has been taking them. His complaints of arm pain could possibly be related to thinking he might be getting a shot, even thought he was assured he was not. However, the care home would like patient to have an xray. Discussed with the nurse that I would relay the message to Dr. Larinda Buttery, which I did.   Vanetta Mulders, PMHNP-BC

## 2022-06-04 NOTE — ED Notes (Signed)
Return call from [place], states she will attempt to set up transport and call back with update.

## 2022-06-04 NOTE — ED Notes (Signed)
vol/consult pending.Marland Kitchen

## 2022-06-04 NOTE — ED Notes (Signed)
Merry Proud states that [employer] taxi is on way to pick up pt. Pt returned belongings and is changing now

## 2022-06-04 NOTE — BH Assessment (Signed)
[name] consult was placed for unknown reason. Patient was brought to ED for pain in left arm. Patient was calm and showed no signs of aggression or irritability. Patient did not respond to any questions and just laid in the bed.   Psych Team spoke to group home owner, Jarvis Morgan and her daughter. They both reported patient complained of shoulder and arm pain. Daughter reports patient was vocal to EMS prior to transport. She also stated that patient is med compliant and doing well with taking them. They expressed a concern of wanting an X-ray on patient's arm.

## 2022-06-04 NOTE — ED Notes (Signed)
Spoke with Merry Proud, nurse at the group home [phone]. Merry Proud states that she still concerned about pt's L arm and if we could do an XR. Explained to Merry Proud that the pt is able to move freely but this RN would ask provider if he would order it

## 2022-06-04 NOTE — ED Notes (Signed)
Attempted to call [name] [phone] at this time. HIPAA compliant voicemail left at this time.

## 2023-01-03 IMAGING — MR MR HEAD WO/W CM
16 series · 48 of 48 positions shown · IV contrast (gadavist)
Comparison: None.

CLINICAL DATA: Delirium

EXAM:
MRI HEAD WITHOUT AND WITH CONTRAST
TECHNIQUE: Multiplanar, multiecho pulse sequences of the brain and surrounding
structures were obtained without and with intravenous contrast.
CONTRAST:  9mL GADAVIST GADOBUTROL 1 MMOL/ML IV SOLN

[Series 9: ax dwi_tracew · axial · 3.0mm · 0.65mm/px · z∈[-150,+5]mm · 3 of 48 slices shown]
[im 1/48]
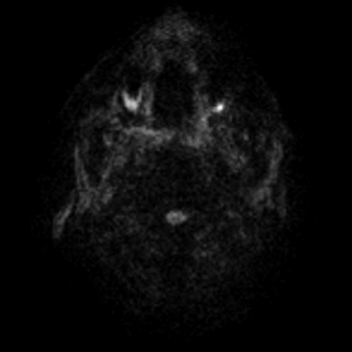
[im 24/48]
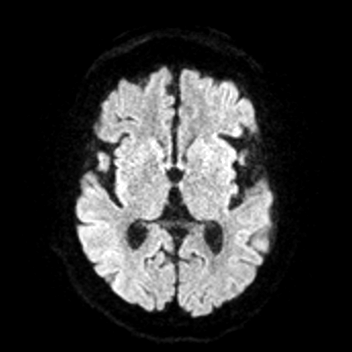
[im 48/48]
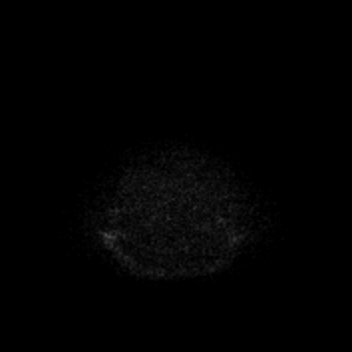

[Series 10: ax dwi_adc · axial · 3.0mm · 0.65mm/px · z∈[-150,+5]mm · 3 of 48 slices shown]
[im 1/48]
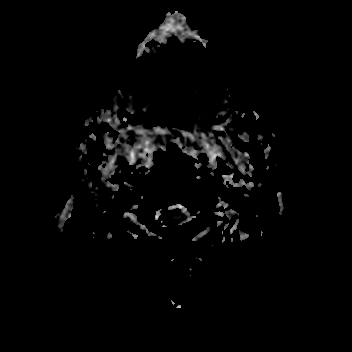
[im 24/48]
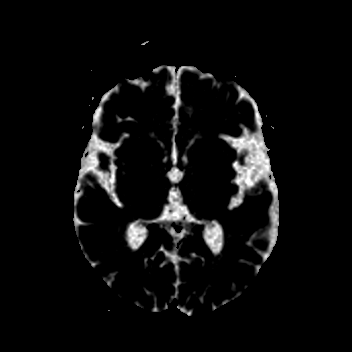
[im 48/48]
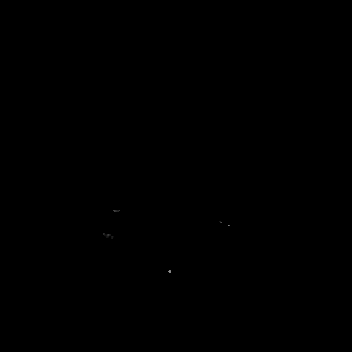

[Series 14: cor dwi_tracew · coronal · 5.0mm · 1.31mm/px · 2 of 38 slices shown]
[im 1/38]
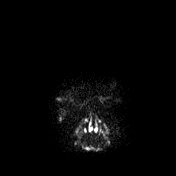
[im 38/38]
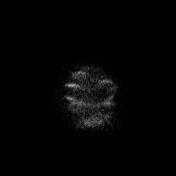

[Series 15: cor dwi_adc · coronal · 5.0mm · 1.31mm/px · 2 of 38 slices shown]
[im 1/38]
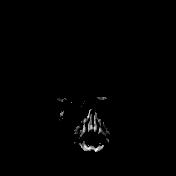
[im 38/38]
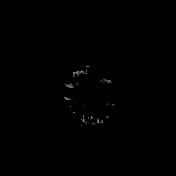

[Series 16: T1 · sagittal · 5.0mm · 0.94mm/px · 1 of 23 slices shown (1 of 4)]
[im 1/23]
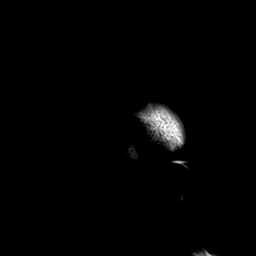

[Series 17: T2 · axial · 5.0mm · 0.45mm/px · z∈[-146,+10]mm · 2 of 27 slices shown (1 of 3)]
[im 1/27]
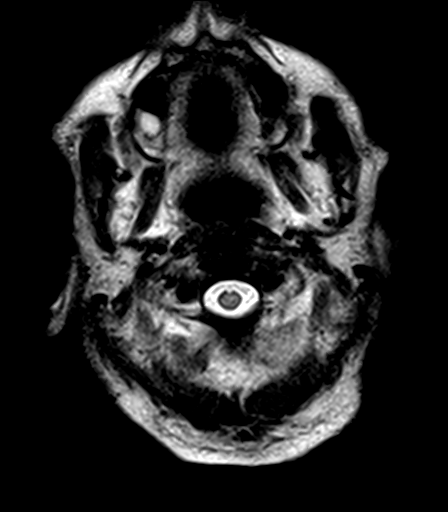
[im 27/27]
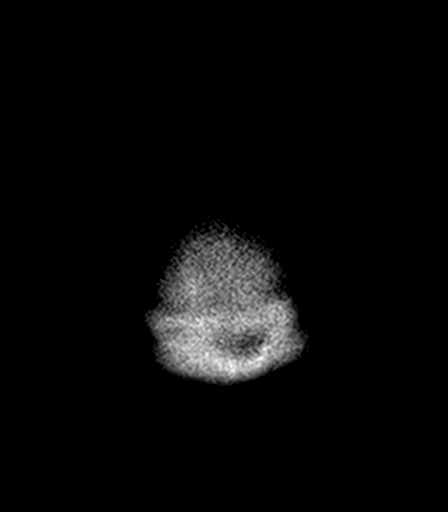

[Series 18: T1 · sagittal · 5.0mm · 0.94mm/px · 1 of 23 slices shown (2 of 4)]
[im 1/23]
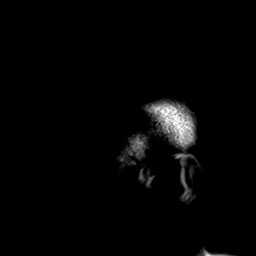

[Series 19: T2 · axial · 5.0mm · 0.45mm/px · z∈[-146,+10]mm · 2 of 27 slices shown (2 of 3)]
[im 1/27]
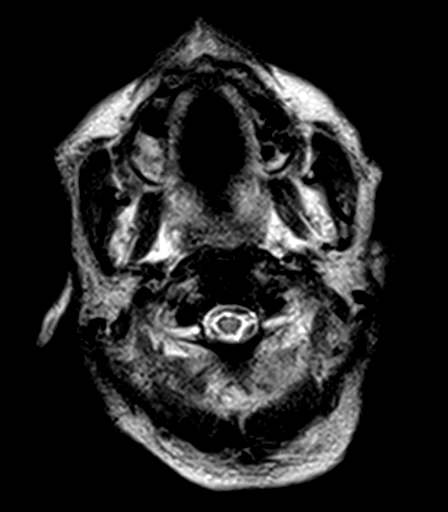
[im 27/27]
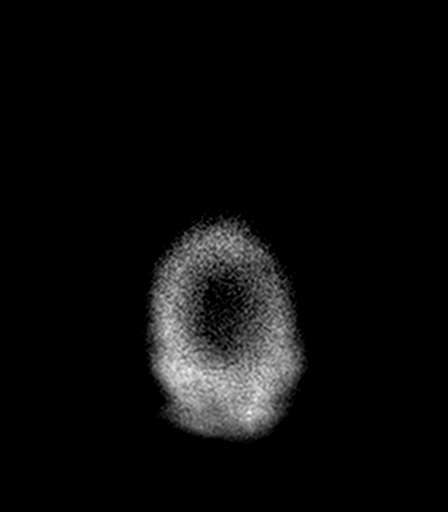

[Series 20: T2-star · axial · 5.0mm · 0.45mm/px · z∈[-146,+10]mm · 2 of 27 slices shown]
[im 1/27]
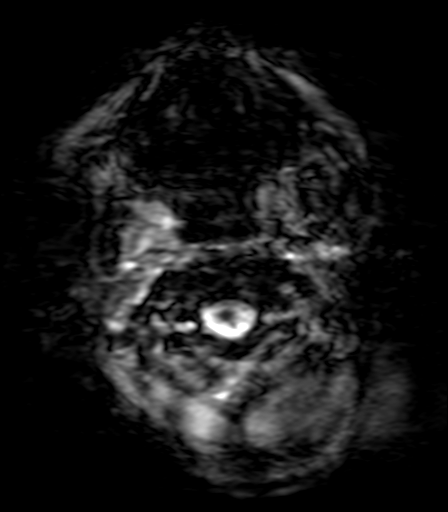
[im 27/27]
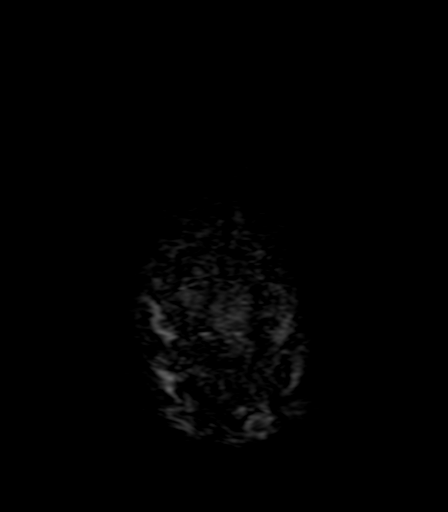

[Series 21: FLAIR · axial · 5.0mm · 1.20mm/px · z∈[-146,+10]mm · 2 of 27 slices shown (1 of 2)]
[im 1/27]
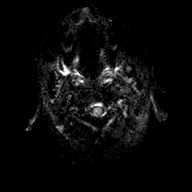
[im 27/27]
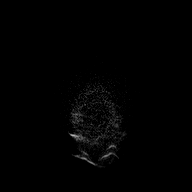

[Series 22: T1 · axial · 1.0mm · 0.98mm/px · z∈[-165,+26]mm · 11 of 192 slices shown (3 of 4)]
[im 1/192]
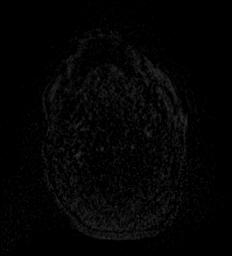
[im 20/192]
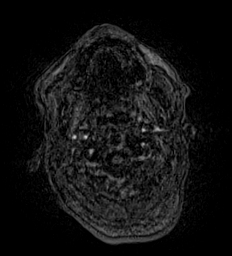
[im 39/192]
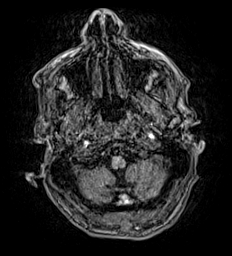
[im 58/192]
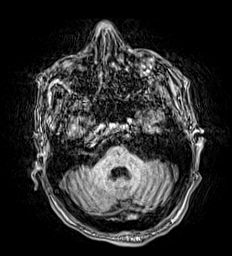
[im 77/192]
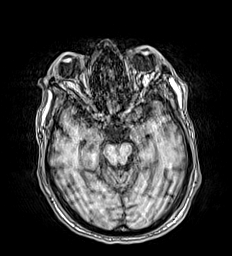
[im 96/192]
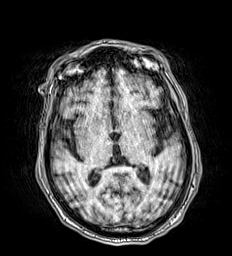
[im 115/192]
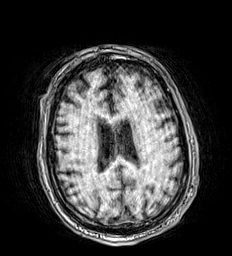
[im 134/192]
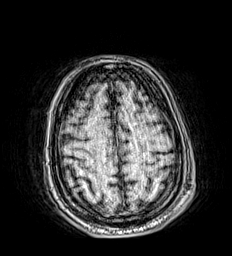
[im 153/192]
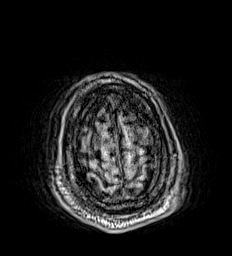
[im 172/192]
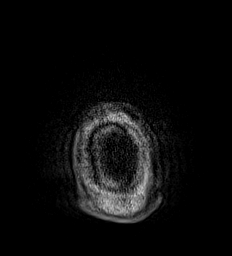
[im 192/192]
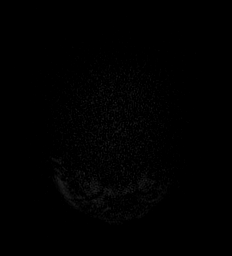

[Series 23: T1 · sagittal · 5.0mm · 0.94mm/px · 1 of 23 slices shown (4 of 4)]
[im 1/23]
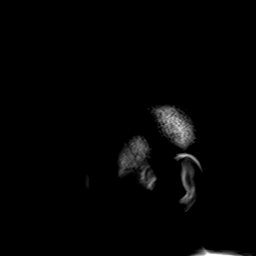

[Series 24: T2 · coronal · 3.0mm · 0.47mm/px · 2 of 35 slices shown (3 of 3)]
[im 1/35]
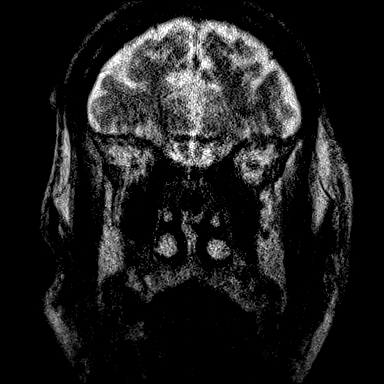
[im 35/35]
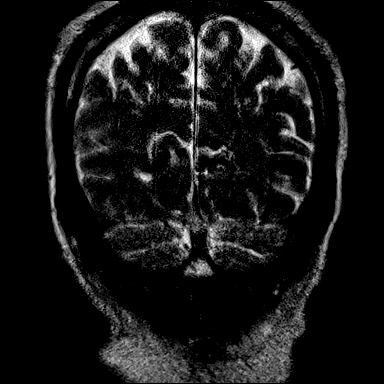

[Series 25: FLAIR · coronal · 3.0mm · 0.47mm/px · 2 of 35 slices shown (2 of 2)]
[im 1/35]
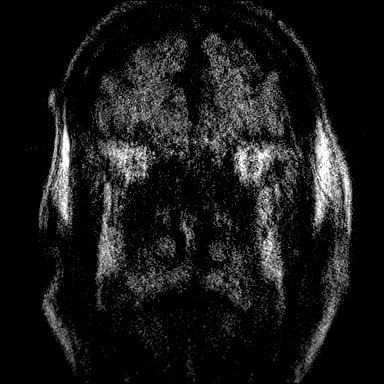
[im 35/35]
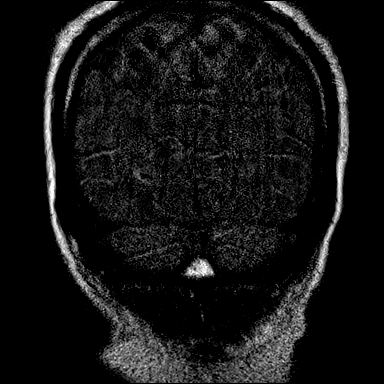

[Series 26: T1 post-contrast · axial · 1.0mm · 0.98mm/px · z∈[-157,+18]mm · 10 of 176 slices shown (1 of 2)]
[im 1/176]
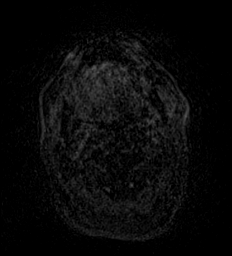
[im 20/176]
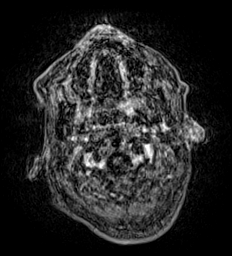
[im 39/176]
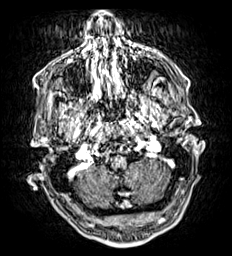
[im 59/176]
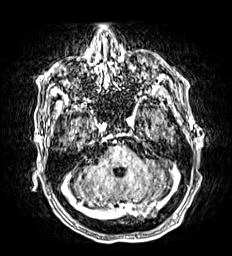
[im 78/176]
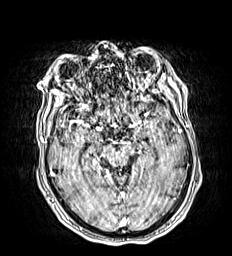
[im 98/176]
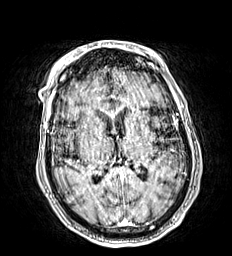
[im 117/176]
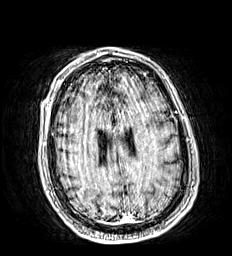
[im 137/176]
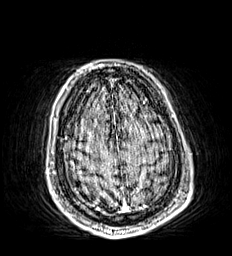
[im 156/176]
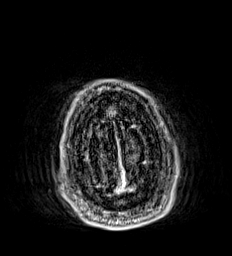
[im 176/176]
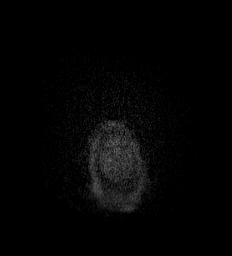

[Series 27: T1 post-contrast · coronal · 5.0mm · 0.90mm/px · 2 of 31 slices shown (2 of 2)]
[im 1/31]
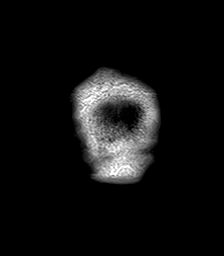
[im 31/31]
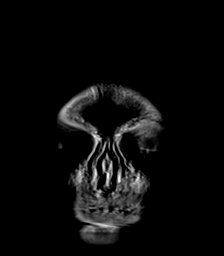

[48 of 48 positions shown; findings below may reference images not displayed]

FINDINGS: Motion degraded examination.

Brain: No acute infarct, mass effect or extra-axial collection. No
acute or chronic hemorrhage. There is multifocal hyperintense
T2-weighted signal within the white matter. Parenchymal volume and
CSF spaces are normal. The midline structures are normal. There is
no abnormal contrast enhancement.

Vascular: Major flow voids are preserved.

Skull and upper cervical spine: Normal calvarium and skull base.
Visualized upper cervical spine and soft tissues are normal.

Sinuses/Orbits:No paranasal sinus fluid levels or advanced mucosal
thickening. No mastoid or middle ear effusion. Normal orbits.
IMPRESSION: 1. Motion degraded examination without acute intracranial
abnormality.
2. Mild multifocal hyperintense T2-weighted signal within the white
matter, most consistent with chronic microvascular ischemia.

## 2023-02-24 ENCOUNTER — Emergency Department
Admission: EM | Admit: 2023-02-24 | Discharge: 2023-02-25 | Disposition: A | Discharge status: Home / Self Care | Payer: Medicare Other | Attending: Emergency Medicine | Admitting: Emergency Medicine

## 2023-02-24 ENCOUNTER — Other Ambulatory Visit: Payer: Self-pay

## 2023-02-24 DIAGNOSIS — E119 Type 2 diabetes mellitus without complications: Secondary | ICD-10-CM | POA: Insufficient documentation

## 2023-02-24 DIAGNOSIS — R4182 Altered mental status, unspecified: Secondary | ICD-10-CM | POA: Diagnosis not present

## 2023-02-24 DIAGNOSIS — R410 Disorientation, unspecified: Secondary | ICD-10-CM | POA: Insufficient documentation

## 2023-02-24 DIAGNOSIS — F209 Schizophrenia, unspecified: Secondary | ICD-10-CM

## 2023-02-24 DIAGNOSIS — I10 Essential (primary) hypertension: Secondary | ICD-10-CM | POA: Diagnosis not present

## 2023-02-24 DIAGNOSIS — F203 Undifferentiated schizophrenia: Secondary | ICD-10-CM | POA: Diagnosis present

## 2023-02-24 LAB — CBC WITH DIFFERENTIAL/PLATELET
Abs Immature Granulocytes: 0.01 10*3/uL (ref 0.00–0.07)
Basophils Absolute: 0 10*3/uL (ref 0.0–0.1)
Basophils Relative: 0 %
Eosinophils Absolute: 0.2 10*3/uL (ref 0.0–0.5)
Eosinophils Relative: 5 %
HCT: 38.2 % — ABNORMAL LOW (ref 39.0–52.0)
Hemoglobin: 12.1 g/dL — ABNORMAL LOW (ref 13.0–17.0)
Immature Granulocytes: 0 %
Lymphocytes Relative: 33 %
Lymphs Abs: 1.5 10*3/uL (ref 0.7–4.0)
MCH: 26.3 pg (ref 26.0–34.0)
MCHC: 31.7 g/dL (ref 30.0–36.0)
MCV: 83 fL (ref 80.0–100.0)
Monocytes Absolute: 0.7 10*3/uL (ref 0.1–1.0)
Monocytes Relative: 15 %
Neutro Abs: 2.2 10*3/uL (ref 1.7–7.7)
Neutrophils Relative %: 47 %
Platelets: 230 10*3/uL (ref 150–400)
RBC: 4.6 MIL/uL (ref 4.22–5.81)
RDW: 14.1 % (ref 11.5–15.5)
WBC: 4.5 10*3/uL (ref 4.0–10.5)
nRBC: 0 % (ref 0.0–0.2)

## 2023-02-24 LAB — ETHANOL: Alcohol, Ethyl (B): <10 mg/dL (ref <10)

## 2023-02-24 MED ORDER — ZIPRASIDONE MESYLATE 20 MG IM SOLR: 20.0000 mg | Freq: Once | INTRAMUSCULAR | Status: DC

## 2023-02-24 NOTE — Consult Note (Incomplete)
Telepsych Consultation   Reason for Consult:  Psych Evaluation Referring Physician:  Dr. Anner Crete Location of Patient: [facility] ER Location of Provider: { [facility] Provider Location:[id number]}  Patient Identification: [name] MRN:  [id number] Principal Diagnosis: Altered mental status Diagnosis:  Principal Problem:   Altered mental status Active Problems:   Schizophrenia, undifferentiated (HCC)   Total Time spent with patient: {Time; 15 min - 8 hours:17441}  Subjective:   [name] is a 67 y.o. male patient admitted with ***.  HPI:  ***  Past Psychiatric History: ***  Risk to Self:   Risk to Others:   Prior Inpatient Therapy:   Prior Outpatient Therapy:    Past Medical History:  Past Medical History:  Diagnosis Date  . Diabetes mellitus without complication (HCC)   . HTN (hypertension)   . Hyperammonemia (HCC) While on Depakote  . Hyperlipemia   . Lithium toxicity   . Neutropenia (HCC)   . Schizophrenia (HCC)   . Seizure (HCC) While toxic on Depakote  . Sickle cell trait (HCC)   . Thyroid disease   . TIA (transient ischemic attack) in 2009   No past surgical history on file. Family History: No family history on file. Family Psychiatric  History: *** Social History:  Social History   Substance and Sexual Activity  Alcohol Use No     Social History   Substance and Sexual Activity  Drug Use Not Currently    Social History   Socioeconomic History  . Marital status: Single    Spouse name: Not on file  . Number of children: Not on file  . Years of education: Not on file  . Highest education level: Not on file  Occupational History  . Not on file  Tobacco Use  . Smoking status: Never  . Smokeless tobacco: Never  Vaping Use  . Vaping status: Never Used  Substance and Sexual Activity  . Alcohol use: No  . Drug use: Not Currently  . Sexual activity: Not Currently  Other Topics Concern  . Not on file  Social History Narrative  . Not on file   Social  Determinants of Health   Financial Resource Strain: Not on file  Food Insecurity: Not on file  Transportation Needs: Not on file  Physical Activity: Not on file  Stress: Not on file  Social Connections: Not on file   Additional Social History:    Allergies:   Allergies  Allergen Reactions  . Depakote [Valproic Acid] Other (See Comments)    Reaction:  Hyperammonemia   . Lithium Other (See Comments)    Pt has a prior history of Lithium toxicity.       Labs:  Results for orders placed or performed during the hospital encounter of 02/24/23 (from the past 48 hour(s))  CBC with Differential/Platelet     Status: Abnormal   Collection Time: 02/24/23 11:35 PM  Result Value Ref Range   WBC 4.5 4.0 - 10.5 K/uL   RBC 4.60 4.22 - 5.81 MIL/uL   Hemoglobin 12.1 (L) 13.0 - 17.0 g/dL   HCT 21.3 (L) 08.6 - 57.8 %   MCV 83.0 80.0 - 100.0 fL   MCH 26.3 26.0 - 34.0 pg   MCHC 31.7 30.0 - 36.0 g/dL   RDW 46.9 62.9 - 52.8 %   Platelets 230 150 - 400 K/uL   nRBC 0.0 0.0 - 0.2 %   Neutrophils Relative % 47 %   Neutro Abs 2.2 1.7 - 7.7 K/uL   Lymphocytes Relative  33 %   Lymphs Abs 1.5 0.7 - 4.0 K/uL   Monocytes Relative 15 %   Monocytes Absolute 0.7 0.1 - 1.0 K/uL   Eosinophils Relative 5 %   Eosinophils Absolute 0.2 0.0 - 0.5 K/uL   Basophils Relative 0 %   Basophils Absolute 0.0 0.0 - 0.1 K/uL   Immature Granulocytes 0 %   Abs Immature Granulocytes 0.01 0.00 - 0.07 K/uL    Comment: Performed at [facility], [address] [place], [place] [address]    Medications:  Current Facility-Administered Medications  Medication Dose Route Frequency Provider Last Rate Last Admin  . ziprasidone (GEODON) injection 20 mg  20 mg Intramuscular Once Ward, Kristen N, DO       Current Outpatient Medications  Medication Sig Dispense Refill  . clonazePAM (KLONOPIN) 0.5 MG tablet Take 0.5 mg by mouth 2 (two) times daily as needed.    . feeding supplement (ENSURE ENLIVE / ENSURE PLUS) LIQD  Take 237 mLs by mouth 3 (three) times daily between meals. 237 mL 0  . folic acid (FOLVITE) 1 MG tablet Take 1 mg by mouth daily.    . haloperidol (HALDOL) 20 MG tablet Take 10 mg by mouth 2 (two) times daily.    . haloperidol decanoate (HALDOL DECANOATE) 100 MG/ML injection Inject 2 mLs (200 mg total) into the muscle every 30 (thirty) days. 2 mL 0  . levothyroxine (SYNTHROID) 150 MCG tablet Take 1 tablet (150 mcg total) by mouth daily at 6 (six) AM. 30 tablet 0  . metFORMIN (GLUCOPHAGE) 1000 MG tablet Take 1 tablet (1,000 mg total) by mouth 2 (two) times daily with a meal. 60 tablet 0  . OLANZapine (ZYPREXA) 20 MG tablet Take 1 tablet (20 mg total) by mouth at bedtime. 30 tablet 0  . simvastatin (ZOCOR) 20 MG tablet Take 20 mg by mouth at bedtime.    . temazepam (RESTORIL) 15 MG capsule Take 15 mg by mouth at bedtime as needed.    . vitamin B-12 (CYANOCOBALAMIN) 1000 MCG tablet Take 1,000 mcg by mouth daily.      Musculoskeletal: Strength & Muscle Tone: {desc; muscle tone:32375} Gait & Station: {PE GAIT ED VHQI:69629} Patient leans: {Patient Leans:21022755}          Psychiatric Specialty Exam:  Presentation  General Appearance: No data recorded Eye Contact:No data recorded Speech:No data recorded Speech Volume:No data recorded Handedness:No data recorded  Mood and Affect  Mood:No data recorded Affect:No data recorded  Thought Process  Thought Processes:No data recorded Descriptions of Associations:No data recorded Orientation:No data recorded Thought Content:No data recorded History of Schizophrenia/Schizoaffective disorder:No data recorded Duration of Psychotic Symptoms:No data recorded Hallucinations:No data recorded Ideas of Reference:No data recorded Suicidal Thoughts:No data recorded Homicidal Thoughts:No data recorded  Sensorium  Memory:No data recorded Judgment:No data recorded Insight:No data recorded  Executive Functions  Concentration:No data  recorded Attention Span:No data recorded Recall:No data recorded Fund of Knowledge:No data recorded Language:No data recorded  Psychomotor Activity  Psychomotor Activity:No data recorded  Assets  Assets:No data recorded  Sleep  Sleep:No data recorded   Physical Exam: Physical Exam Vitals and nursing note reviewed.  Constitutional:      Appearance: He is ill-appearing and toxic-appearing.  HENT:     Head: Normocephalic and atraumatic.     Nose: Nose normal.     Mouth/Throat:     Mouth: Mucous membranes are dry.  Eyes:     Pupils: Pupils are equal, round, and reactive to light.  Cardiovascular:  Rate and Rhythm: Tachycardia present.  Pulmonary:     Effort: Pulmonary effort is normal.  Musculoskeletal:        General: Normal range of motion.     Cervical back: Normal range of motion.  Skin:    General: Skin is dry.  Neurological:     Mental Status: He is disoriented.  Psychiatric:        Attention and Perception: He is inattentive.        Mood and Affect: Affect is blunt.        Speech: He is noncommunicative. Speech is delayed.        Behavior: Behavior is uncooperative and withdrawn.        Thought Content: Thought content does not include suicidal ideation. Thought content does not include suicidal plan.        Cognition and Memory: Cognition is impaired. Memory is impaired.        Judgment: Judgment is inappropriate.    Review of Systems  Constitutional:  Positive for fever.  Neurological:  Positive for tremors and sensory change.  Psychiatric/Behavioral:  Positive for memory loss. The patient is nervous/anxious.    Blood pressure (!) 189/114, pulse (!) 109, temperature 98.6 F (37 C), temperature source Oral, resp. rate 16, SpO2 99%. There is no height or weight on file to calculate BMI.  Treatment Plan Summary: Daily contact with patient to assess and evaluate symptoms and progress in treatment, Medication management, and Plan  [name] was admitted  to [facility] under the service of Ward, Kristen N, DO for Altered mental status, crisis management, and stabilization. Routine labs ordered, which include Lab Orders         Comprehensive metabolic panel         Ethanol         Urine Drug Screen, Qualitative         CBC with Diff         Urinalysis, Routine w reflex microscopic -Urine, Clean Catch         CBC with Differential/Platelet     Medication Management: Medications started . ziprasidone  20 mg Intramuscular Once   Will maintain observation checks every 15 minutes for safety. Psychosocial education regarding relapse prevention and self-care; social and communication  Social work will consult with family for collateral information and discuss discharge and follow up plan.  Disposition: No evidence of imminent risk to self or others at present.   Supportive therapy provided about ongoing stressors. Refer to IOP. Discussed crisis plan, support from social network, calling 911, coming to the Emergency Department, and calling Suicide Hotline.     Jearld Lesch, NP 02/24/2023 11:45 PM

## 2023-02-24 NOTE — ED Notes (Signed)
Psych NP called to [name] pt but pt is uncooperative and will not respond verbally to this RN. Pt will not get up and go to interview room either. Pt will not allow VS to be taken either. Pt calm but not cooperative

## 2023-02-24 NOTE — ED Triage Notes (Addendum)
Pt arrives via [facility] from [facility]. EMS dispatched because patient refused to participate in physical therapy and the therapist thought it was abnormal behavior. Staff at care home reports he has been acting normal today, just not cooperative. Pt has hx of schizophrenia. Pt uncooperative getting from stretcher to wheelchair. Pt keeps standing up from chair stating, "I gotta move." Pt will not answer questions in triage.

## 2023-02-24 NOTE — Consult Note (Signed)
Telepsych Consultation   Reason for Consult:  Psych Evaluation Referring Physician:  Dr. Anner Crete Location of Patient: [facility] ER Location of Provider: Other: remote office  Patient Identification: [name] MRN:  [id number] Principal Diagnosis: Altered mental status Diagnosis:  Principal Problem:   Altered mental status Active Problems:   Schizophrenia, undifferentiated (HCC)   Total Time spent with patient: 30 minutes  Subjective:   [name] is a 67 y.o. male patient admitted with .  HPI:  [name], 67 y.o., male patient seen via tele health by this provider; chart reviewed and consulted with Dr. Elesa Massed on 02/25/23.  Per chart review, pt arrives via [facility] from [facility]. EMS dispatched because patient refused to participate in physical therapy and the therapist thought it was abnormal behavior. Staff at care home reports he has been acting normal today, just not cooperative. Pt has hx of schizophrenia. Pt uncooperative getting from stretcher to wheelchair. Pt keeps standing up from chair stating, "I gotta move." Pt will not answer questions in triage.   On evaluation [name] reports that he doesn't know why he's here.  He denies SI/HI/AVH.    Per EDP, [name] is a 67 y.o. male with HTN, HLD, diabetes, schizophrenia who presents today for medical clearance.  Patient was poorly at his group home and working with a physical therapist who thought he was less cooperative than his baseline and wanted him to come to the emergency department for further evaluation.  Other members of the group home state the patient is at his normal baseline and they have not noted any other acute changes.  Patient is denying any complaints here in the emergency department at this time and is at his baseline.   After thorough evaluation and review of information currently presented on assessment of [name], there is insufficient findings to indicate patient meets criteria for involuntary  commitment or require an inpatient level of care. [name] is alert/oriented x4, organized; mood congruent with affect; and denies suicidal/self-harm/homicidal ideation, psychosis, and paranoia.  Currently he is not significantly impaired, psychotic, or manic on exam.   A detailed risk assessment has been completed based on clinical exam and individual risk factors.  Patient acute suicide risk is low; and a safety plan has been created jointly which involves patient following up.   Disposition:  Patient psychiatrically cleared     Dr. Elesa Massed; informed of above recommendation and disposition  Past Psychiatric History: schizophrenia  Risk to Self:   Risk to Others:   Prior Inpatient Therapy:   Prior Outpatient Therapy:    Past Medical History:  Past Medical History:  Diagnosis Date   Diabetes mellitus without complication (HCC)    HTN (hypertension)    Hyperammonemia (HCC) While on Depakote   Hyperlipemia    Lithium toxicity    Neutropenia (HCC)    Schizophrenia (HCC)    Seizure (HCC) While toxic on Depakote   Sickle cell trait (HCC)    Thyroid disease    TIA (transient ischemic attack) in 2009   No past surgical history on file. Family History: No family history on file. Family Psychiatric  History: unknown Social History:  Social History   Substance and Sexual Activity  Alcohol Use No     Social History   Substance and Sexual Activity  Drug Use Not Currently    Social History   Socioeconomic History   Marital status: Single    Spouse name: Not on file   Number  of children: Not on file   Years of education: Not on file   Highest education level: Not on file  Occupational History   Not on file  Tobacco Use   Smoking status: Never   Smokeless tobacco: Never  Vaping Use   Vaping status: Never Used  Substance and Sexual Activity   Alcohol use: No   Drug use: Not Currently   Sexual activity: Not Currently  Other Topics Concern   Not on file  Social  History Narrative   Not on file   Social Determinants of Health   Financial Resource Strain: Not on file  Food Insecurity: Not on file  Transportation Needs: Not on file  Physical Activity: Not on file  Stress: Not on file  Social Connections: Not on file   Additional Social History:    Allergies:   Allergies  Allergen Reactions   Depakote [Valproic Acid] Other (See Comments)    Reaction:  Hyperammonemia    Lithium Other (See Comments)    Pt has a prior history of Lithium toxicity.       Labs:  Results for orders placed or performed during the hospital encounter of 02/24/23 (from the past 48 hour(s))  CBC with Differential/Platelet     Status: Abnormal   Collection Time: 02/24/23 11:35 PM  Result Value Ref Range   WBC 4.5 4.0 - 10.5 K/uL   RBC 4.60 4.22 - 5.81 MIL/uL   Hemoglobin 12.1 (L) 13.0 - 17.0 g/dL   HCT 16.1 (L) 09.6 - 04.5 %   MCV 83.0 80.0 - 100.0 fL   MCH 26.3 26.0 - 34.0 pg   MCHC 31.7 30.0 - 36.0 g/dL   RDW 40.9 81.1 - 91.4 %   Platelets 230 150 - 400 K/uL   nRBC 0.0 0.0 - 0.2 %   Neutrophils Relative % 47 %   Neutro Abs 2.2 1.7 - 7.7 K/uL   Lymphocytes Relative 33 %   Lymphs Abs 1.5 0.7 - 4.0 K/uL   Monocytes Relative 15 %   Monocytes Absolute 0.7 0.1 - 1.0 K/uL   Eosinophils Relative 5 %   Eosinophils Absolute 0.2 0.0 - 0.5 K/uL   Basophils Relative 0 %   Basophils Absolute 0.0 0.0 - 0.1 K/uL   Immature Granulocytes 0 %   Abs Immature Granulocytes 0.01 0.00 - 0.07 K/uL    Comment: Performed at [facility], [address] [place], [place] [address]    Medications:  Current Facility-Administered Medications  Medication Dose Route Frequency Provider Last Rate Last Admin   ziprasidone (GEODON) injection 20 mg  20 mg Intramuscular Once Ward, Kristen N, DO       Current Outpatient Medications  Medication Sig Dispense Refill   clonazePAM (KLONOPIN) 0.5 MG tablet Take 0.5 mg by mouth 2 (two) times daily as needed.     feeding supplement  (ENSURE ENLIVE / ENSURE PLUS) LIQD Take 237 mLs by mouth 3 (three) times daily between meals. 237 mL 0   folic acid (FOLVITE) 1 MG tablet Take 1 mg by mouth daily.     haloperidol (HALDOL) 20 MG tablet Take 10 mg by mouth 2 (two) times daily.     haloperidol decanoate (HALDOL DECANOATE) 100 MG/ML injection Inject 2 mLs (200 mg total) into the muscle every 30 (thirty) days. 2 mL 0   levothyroxine (SYNTHROID) 150 MCG tablet Take 1 tablet (150 mcg total) by mouth daily at 6 (six) AM. 30 tablet 0   metFORMIN (GLUCOPHAGE) 1000 MG  tablet Take 1 tablet (1,000 mg total) by mouth 2 (two) times daily with a meal. 60 tablet 0   OLANZapine (ZYPREXA) 20 MG tablet Take 1 tablet (20 mg total) by mouth at bedtime. 30 tablet 0   simvastatin (ZOCOR) 20 MG tablet Take 20 mg by mouth at bedtime.     temazepam (RESTORIL) 15 MG capsule Take 15 mg by mouth at bedtime as needed.     vitamin B-12 (CYANOCOBALAMIN) 1000 MCG tablet Take 1,000 mcg by mouth daily.      Musculoskeletal: Strength & Muscle Tone: within normal limits Gait & Station: normal Patient leans: N/A  Psychiatric Specialty Exam:  Presentation  General Appearance: No data recorded Eye Contact:No data recorded Speech:No data recorded Speech Volume:No data recorded Handedness:No data recorded  Mood and Affect  Mood:No data recorded Affect:No data recorded  Thought Process  Thought Processes:No data recorded Descriptions of Associations:No data recorded Orientation:No data recorded Thought Content:No data recorded History of Schizophrenia/Schizoaffective disorder:No data recorded Duration of Psychotic Symptoms:No data recorded Hallucinations:No data recorded Ideas of Reference:No data recorded Suicidal Thoughts:No data recorded Homicidal Thoughts:No data recorded  Sensorium  Memory:No data recorded Judgment:No data recorded Insight:No data recorded  Executive Functions  Concentration:No data recorded Attention Span:No data  recorded Recall:No data recorded Fund of Knowledge:No data recorded Language:No data recorded  Psychomotor Activity  Psychomotor Activity:No data recorded  Assets  Assets:No data recorded  Sleep  Sleep:No data recorded   Physical Exam: Physical Exam Vitals and nursing note reviewed.  Constitutional:      Appearance: He is ill-appearing and toxic-appearing.  HENT:     Head: Normocephalic and atraumatic.     Nose: Nose normal.     Mouth/Throat:     Mouth: Mucous membranes are dry.  Eyes:     Pupils: Pupils are equal, round, and reactive to light.  Cardiovascular:     Rate and Rhythm: Tachycardia present.  Pulmonary:     Effort: Pulmonary effort is normal.  Musculoskeletal:        General: Normal range of motion.     Cervical back: Normal range of motion.  Skin:    General: Skin is dry.  Neurological:     Mental Status: He is disoriented.  Psychiatric:        Attention and Perception: He is inattentive.        Mood and Affect: Affect is blunt.        Speech: He is noncommunicative. Speech is delayed.        Behavior: Behavior is uncooperative and withdrawn.        Thought Content: Thought content does not include suicidal ideation. Thought content does not include suicidal plan.        Cognition and Memory: Cognition is impaired. Memory is impaired.        Judgment: Judgment is inappropriate.   Review of Systems  Constitutional:  Positive for fever.  Neurological:  Positive for tremors and sensory change.  Psychiatric/Behavioral:  Positive for memory loss. The patient is nervous/anxious.    Blood pressure (!) 189/114, pulse (!) 109, temperature 98.6 F (37 C), temperature source Oral, resp. rate 16, SpO2 99%. There is no height or weight on file to calculate BMI.  Treatment Plan Summary: Daily contact with patient to assess and evaluate symptoms and progress in treatment, Medication management, and Plan  Loveless Corro was admitted to [facility] under the service of Ward,  Kristen N, DO for Altered mental status, crisis management, and stabilization. Routine labs  ordered, which include Lab Orders         Comprehensive metabolic panel         Ethanol         Urine Drug Screen, Qualitative         CBC with Diff         Urinalysis, Routine w reflex microscopic -Urine, Clean Catch         CBC with Differential/Platelet     Medication Management: Medications started  ziprasidone  20 mg Intramuscular Once   Will maintain observation checks every 15 minutes for safety. Psychosocial education regarding relapse prevention and self-care; social and communication  Social work will consult with family for collateral information and discuss discharge and follow up plan.  Disposition: No evidence of imminent risk to self or others at present.   Supportive therapy provided about ongoing stressors. Refer to IOP. Discussed crisis plan, support from social network, calling 911, coming to the Emergency Department, and calling Suicide Hotline.     Jearld Lesch, NP 02/24/2023 11:45 PM

## 2023-02-24 NOTE — ED Provider Notes (Signed)
11:15 PM  Assumed care at shift change.  12:50 AM  Pt's blood work today is unremarkable.  Normal electrolytes, renal function, glucose.  Negative ethanol level.  Patient at his baseline per group home per previous providers report.  Patient seen and cleared by psychiatry.  Will discharge.  Of note, patient has had some mildly elevated blood pressures but this comes with agitation regarding having his vital signs checked.  Will have him follow-up with his PCP to have this rechecked as an outpatient.   Westyn Keatley, Layla Maw, DO 02/25/23 [phone]

## 2023-02-24 NOTE — ED Provider Notes (Signed)
[facility] Provider Note    Event Date/Time   First MD Initiated Contact with Patient 02/24/23 1629     (approximate)   History   Medical Clearance   HPI [name] is a 67 y.o. male with HTN, HLD, diabetes, schizophrenia who presents today for medical clearance.  Patient was poorly at his group home and working with a physical therapist who thought he was less cooperative than his baseline and wanted him to come to the emergency department for further evaluation.  Other members of the group home state the patient is at his normal baseline and they have not noted any other acute changes.  Patient is denying any complaints here in the emergency department at this time and is at his baseline.     Physical Exam   Triage Vital Signs: ED Triage Vitals [02/24/23 1556]  Encounter Vitals Group     BP (!) 194/113     Systolic BP Percentile      Diastolic BP Percentile      Pulse Rate (!) 103     Resp 17     Temp 99.5 F (37.5 C)     Temp Source Oral     SpO2      Weight      Height      Head Circumference      Peak Flow      Pain Score 0     Pain Loc      Pain Education      Exclude from Growth Chart     Most recent vital signs: Vitals:   02/24/23 1556 02/24/23 2311  BP: (!) 194/113 (!) 189/114  Pulse: (!) 103 (!) 109  Resp: 17 16  Temp: 99.5 F (37.5 C) 98.6 F (37 C)  SpO2:  99%   Physical Exam: I have reviewed the vital signs and nursing notes. General: Awake, alert, no acute distress.  Nontoxic appearing.  Resting tremor present. Head:  Atraumatic, normocephalic.   ENT:  EOM intact, PERRL. Oral mucosa is pink and moist with no lesions. Neck: Neck is supple with full range of motion, No meningeal signs. Cardiovascular:  RRR, No murmurs. Peripheral pulses palpable and equal bilaterally. Respiratory:  Symmetrical chest wall expansion.  No rhonchi, rales, or wheezes.  Good air movement throughout.  No use of accessory muscles.    Musculoskeletal:  No cyanosis or edema. Moving extremities with full ROM Abdomen:  Soft, nontender, nondistended. Neuro:  GCS 14, mildly confused but reportedly has baseline.  Otherwise answers all questions appropriately. moving all four extremities, interacting appropriately. Speech clear. Psych:  Calm, appropriate.   Skin:  Warm, dry, no rash.     ED Results / Procedures / Treatments   Labs (all labs ordered are listed, but only abnormal results are displayed) Labs Reviewed  COMPREHENSIVE METABOLIC PANEL  ETHANOL  URINE DRUG SCREEN, QUALITATIVE ([facility] ONLY)  CBC WITH DIFFERENTIAL/PLATELET  URINALYSIS, ROUTINE W REFLEX MICROSCOPIC     EKG    RADIOLOGY    PROCEDURES:  Critical Care performed: No  Procedures   MEDICATIONS ORDERED IN ED: Medications - No data to display   IMPRESSION / MDM / ASSESSMENT AND PLAN / ED COURSE  I reviewed the triage vital signs and the nursing notes.                              Differential diagnosis includes, but is not limited to,  schizophrenia, acute psychosis, dehydration.  Patient's presentation is most consistent with severe exacerbation of chronic illness.  Patient is a 67 year old male who presents today from his group home over concerns that he was acting differently than his baseline.  Patient has no complaints at this time.  Slight tachycardia on arrival but otherwise patient asking to leave.  Psychiatry consulted for further evaluation.  Patient signed out to Dr. Elesa Massed while awaiting psychiatric evaluation.  Patient did have some persistent tachycardia and will get additional laboratory workup and urine studies for further evaluation to make sure he is medically cleared.  The patient is on the cardiac monitor to evaluate for evidence of arrhythmia and/or significant heart rate changes.     FINAL CLINICAL IMPRESSION(S) / ED DIAGNOSES   Final diagnoses:  Schizophrenia, unspecified type (HCC)     Rx / DC Orders   ED  Discharge Orders     None        Note:  This document was prepared using Dragon voice recognition software and may include unintentional dictation errors.   Janith Lima, MD 02/24/23 [phone]

## 2023-02-25 LAB — COMPREHENSIVE METABOLIC PANEL
ALT: 15 U/L (ref 0–44)
AST: 22 U/L (ref 15–41)
Albumin: 3.9 g/dL (ref 3.5–5.0)
Alkaline Phosphatase: 73 U/L (ref 38–126)
Anion gap: 10 (ref 5–15)
BUN: 13 mg/dL (ref 8–23)
CO2: 27 mmol/L (ref 22–32)
Calcium: 8.5 mg/dL — ABNORMAL LOW (ref 8.9–10.3)
Chloride: 105 mmol/L (ref 98–111)
Creatinine, Ser: 0.89 mg/dL (ref 0.61–1.24)
GFR, Estimated: >60 mL/min (ref >60)
Glucose, Bld: 128 mg/dL — ABNORMAL HIGH (ref 70–99)
Potassium: 3.6 mmol/L (ref 3.5–5.1)
Sodium: 142 mmol/L (ref 135–145)
Total Bilirubin: 0.8 mg/dL (ref 0.3–1.2)
Total Protein: 6.8 g/dL (ref 6.5–8.1)

## 2023-02-25 NOTE — ED Notes (Signed)
Called group home for transportation home [phone] said they would come between 630am and 7am

## 2023-02-25 NOTE — ED Notes (Signed)
Pt standing by 24H stretcher. Pt is calm and cooperative. Waiting for group home to come for pickup.

## 2023-02-25 NOTE — ED Notes (Signed)
[facility] ([phone]) called, on way to pick up pt.

## 2023-02-25 NOTE — ED Notes (Signed)
vol/consult done/pending discharge to group home.Marland Kitchen

## 2023-02-25 NOTE — ED Notes (Signed)
Pt walking towards exit/lobby, redirected by this RN and Industrial/product designer. Pt not communicating with staff verbally.

## 2023-02-25 NOTE — ED Notes (Signed)
Legal guardian Sharlyne Cai contacted to notify of pt anticipated d/c. No answer, VM left.

## 2023-02-25 NOTE — ED Notes (Signed)
Pt provided phone to call group home and see when they will arrive for pt.

## 2023-02-25 NOTE — ED Notes (Signed)
Called group home at this time to see if they are coming to pick up the pt. Staff stated that they would call the person transporting and make sure they were still coming.

## 2023-02-25 NOTE — ED Notes (Signed)
Pt alert, standing in hallway. Pt asked if we had called group home to come pick him up. Informed pt that yes group home was called and we are waiting for them to come get him. Night RN left message for legal guardian and spoke with group home staff/gave report.

## 2023-02-25 NOTE — ED Notes (Signed)
Pt repeatedly standing up and sitting back down. Denies needs. Updated on plan of care

## 2023-02-25 NOTE — Discharge Instructions (Signed)
Patient's labs are unremarkable today.  He has been seen and cleared by psychiatry and does not meet criteria for inpatient psychiatric admission.  His blood pressures have been slightly elevated today.  We do recommend follow-up with his PCP to have these rechecked as they may need to start him on blood pressure medication.
